# Patient Record
Sex: Male | Born: 1950 | Race: White | Hispanic: No | Smoking: Never smoker
Health system: Southern US, Community
[De-identification: ages and names within clinical notes are randomized; demographics above are authoritative.]

## PROBLEM LIST (undated history)

## (undated) DIAGNOSIS — N400 Enlarged prostate without lower urinary tract symptoms: Secondary | ICD-10-CM

## (undated) DIAGNOSIS — E785 Hyperlipidemia, unspecified: Secondary | ICD-10-CM

## (undated) DIAGNOSIS — C801 Malignant (primary) neoplasm, unspecified: Secondary | ICD-10-CM

## (undated) DIAGNOSIS — E119 Type 2 diabetes mellitus without complications: Secondary | ICD-10-CM

## (undated) DIAGNOSIS — M1990 Unspecified osteoarthritis, unspecified site: Secondary | ICD-10-CM

## (undated) DIAGNOSIS — K760 Fatty (change of) liver, not elsewhere classified: Secondary | ICD-10-CM

## (undated) DIAGNOSIS — Z8619 Personal history of other infectious and parasitic diseases: Secondary | ICD-10-CM

## (undated) DIAGNOSIS — D573 Sickle-cell trait: Secondary | ICD-10-CM

## (undated) DIAGNOSIS — T8859XA Other complications of anesthesia, initial encounter: Secondary | ICD-10-CM

## (undated) DIAGNOSIS — I10 Essential (primary) hypertension: Secondary | ICD-10-CM

## (undated) DIAGNOSIS — C44301 Unspecified malignant neoplasm of skin of nose: Secondary | ICD-10-CM

## (undated) DIAGNOSIS — Z8739 Personal history of other diseases of the musculoskeletal system and connective tissue: Secondary | ICD-10-CM

## (undated) DIAGNOSIS — I890 Lymphedema, not elsewhere classified: Secondary | ICD-10-CM

## (undated) HISTORY — PX: VASECTOMY: SHX75

## (undated) HISTORY — DX: Essential (primary) hypertension: I10

## (undated) HISTORY — DX: Type 2 diabetes mellitus without complications: E11.9

## (undated) HISTORY — DX: Unspecified osteoarthritis, unspecified site: M19.90

## (undated) HISTORY — PX: COLONOSCOPY: SHX174

## (undated) HISTORY — DX: Personal history of other diseases of the musculoskeletal system and connective tissue: Z87.39

## (undated) HISTORY — DX: Personal history of other infectious and parasitic diseases: Z86.19

## (undated) HISTORY — DX: Hyperlipidemia, unspecified: E78.5

## (undated) HISTORY — DX: Malignant (primary) neoplasm, unspecified: C80.1

## (undated) HISTORY — PX: INGUINAL HERNIA REPAIR: SUR1180

## (undated) HISTORY — PX: COLONOSCOPY WITH ESOPHAGOGASTRODUODENOSCOPY (EGD): SHX5779

## (undated) HISTORY — PX: TONSILLECTOMY AND ADENOIDECTOMY: SUR1326

## (undated) HISTORY — PX: EYE SURGERY: SHX253

## (undated) HISTORY — DX: Lymphedema, not elsewhere classified: I89.0

---

## 1978-08-08 HISTORY — PX: INGUINAL HERNIA REPAIR: SUR1180

## 1980-08-08 HISTORY — PX: ANTERIOR CRUCIATE LIGAMENT REPAIR: SHX115

## 2004-08-08 HISTORY — PX: GASTRIC BYPASS: SHX52

## 2010-04-05 ENCOUNTER — Emergency Department: Payer: Self-pay | Admitting: Emergency Medicine

## 2010-09-09 ENCOUNTER — Emergency Department: Payer: Self-pay | Admitting: Emergency Medicine

## 2010-11-02 ENCOUNTER — Ambulatory Visit: Payer: Self-pay | Admitting: Internal Medicine

## 2010-11-18 ENCOUNTER — Ambulatory Visit: Payer: Self-pay | Admitting: Orthopedic Surgery

## 2012-08-08 HISTORY — PX: MENISCUS REPAIR: SHX5179

## 2015-06-17 DIAGNOSIS — R7989 Other specified abnormal findings of blood chemistry: Secondary | ICD-10-CM | POA: Insufficient documentation

## 2015-06-25 ENCOUNTER — Ambulatory Visit (INDEPENDENT_AMBULATORY_CARE_PROVIDER_SITE_OTHER): Payer: Medicare Other | Admitting: Urology

## 2015-06-25 ENCOUNTER — Encounter: Payer: Self-pay | Admitting: Urology

## 2015-06-25 VITALS — BP 146/86 | HR 62 | Ht 68.0 in | Wt 235.2 lb

## 2015-06-25 DIAGNOSIS — E785 Hyperlipidemia, unspecified: Secondary | ICD-10-CM | POA: Insufficient documentation

## 2015-06-25 DIAGNOSIS — R972 Elevated prostate specific antigen [PSA]: Secondary | ICD-10-CM | POA: Diagnosis not present

## 2015-06-25 DIAGNOSIS — D573 Sickle-cell trait: Secondary | ICD-10-CM | POA: Insufficient documentation

## 2015-06-25 DIAGNOSIS — Z8639 Personal history of other endocrine, nutritional and metabolic disease: Secondary | ICD-10-CM | POA: Insufficient documentation

## 2015-06-25 DIAGNOSIS — M1990 Unspecified osteoarthritis, unspecified site: Secondary | ICD-10-CM

## 2015-06-25 DIAGNOSIS — I10 Essential (primary) hypertension: Secondary | ICD-10-CM | POA: Insufficient documentation

## 2015-06-25 DIAGNOSIS — Z8619 Personal history of other infectious and parasitic diseases: Secondary | ICD-10-CM

## 2015-06-25 DIAGNOSIS — Z8739 Personal history of other diseases of the musculoskeletal system and connective tissue: Secondary | ICD-10-CM | POA: Insufficient documentation

## 2015-06-25 DIAGNOSIS — E119 Type 2 diabetes mellitus without complications: Secondary | ICD-10-CM

## 2015-06-25 HISTORY — DX: Personal history of other diseases of the musculoskeletal system and connective tissue: Z87.39

## 2015-06-25 HISTORY — DX: Unspecified osteoarthritis, unspecified site: M19.90

## 2015-06-25 HISTORY — DX: Essential (primary) hypertension: I10

## 2015-06-25 HISTORY — DX: Personal history of other infectious and parasitic diseases: Z86.19

## 2015-06-25 HISTORY — DX: Type 2 diabetes mellitus without complications: E11.9

## 2015-06-25 MED ORDER — FINASTERIDE 5 MG PO TABS: 5.0000 mg | ORAL_TABLET | Freq: Every day | ORAL | Status: DC

## 2015-06-25 NOTE — Addendum Note (Signed)
Addended by: Catalina LungerBUDZYN, Marisha Renier J on: 06/25/2015 03:40 PM   Modules accepted: Orders

## 2015-06-25 NOTE — Progress Notes (Signed)
06/25/2015 2:47 PM   Gene Brown February 13, 1951 [id number]  Referring provider: Barbette Reichmann, MD [address] [facility] [place], [place] [address]  Chief Complaint  Patient presents with  . Elevated PSA    Pt reports having (-) neg prostate biopsy x 22yrs ago by urologist [facility].     HPI: The patient is a 64 year old male who presents with an elevated PSA. His PSA is 5.7. He had a prostate biopsy 10 years ago in [place] which was negative. He does not appear he has had his PSA checked regularly since that time.  He also has significant voiding symptoms. His I PSS score is 26/4 with fours and fives for incomplete emptying, frequency, intermittency, urgency, and weak stream. He also notes nocturia 3. He was just started on Flomax yesterday by his PCP.  The patient also has erectile dysfunction. He unable to obtain erections suitable for intercourse. He has tried Viagra and Cialis in the past. These medications have worked, however he did not tolerate the side effects in particular congestion in his head.  PMH: Past Medical History  Diagnosis Date  . BP (high blood pressure) 06/25/2015  . Type 2 diabetes mellitus (HCC) 06/25/2015  . Arthritis 06/25/2015  . H/O varicella 06/25/2015  . H/O: gout 06/25/2015    Surgical History: Past Surgical History  Procedure Laterality Date  . Tonsillectomy and adenoidectomy    . Vasectomy    . Inguinal hernia repair    . Anterior cruciate ligament repair  1982    2004-2005  . Gastric bypass  2006  . Meniscus repair  2014    Home Medications:    Medication List       This list is accurate as of: 06/25/15  2:47 PM.  Always use your most recent med list.               amoxicillin-clavulanate 875-125 MG tablet  Commonly known as:  AUGMENTIN     glipiZIDE 5 MG tablet  Commonly known as:  GLUCOTROL  Take by mouth.     lisinopril 5 MG tablet  Commonly known as:  PRINIVIL,ZESTRIL  Take by  mouth.     metFORMIN 1000 MG tablet  Commonly known as:  GLUCOPHAGE  TAKE 1 TABLET (1,000 MG TOTAL) BY MOUTH 2 (TWO) TIMES DAILY WITH MEALS.     RA VITAMIN B-12 TR 1000 MCG Tbcr  Generic drug:  Cyanocobalamin  Take by mouth.     tamsulosin 0.4 MG Caps capsule  Commonly known as:  FLOMAX  Take by mouth.        Allergies: No Known Allergies  Family History: Family History  Problem Relation Age of Onset  . Prostate cancer Father   . Hematuria Neg Hx   . Renal cancer Neg Hx     Social History:  reports that he has never smoked. He does not have any smokeless tobacco history on file. He reports that he does not drink alcohol or use illicit drugs.  ROS: UROLOGY Frequent Urination?: Yes Hard to postpone urination?: Yes Burning/pain with urination?: Yes Get up at night to urinate?: Yes Leakage of urine?: Yes Urine stream starts and stops?: Yes Trouble starting stream?: No Do you have to strain to urinate?: No Blood in urine?: No Urinary tract infection?: No Sexually transmitted disease?: No Injury to kidneys or bladder?: No Painful intercourse?: No Weak stream?: Yes Erection problems?: Yes Penile pain?: No  Gastrointestinal Nausea?: No Vomiting?: No Indigestion/heartburn?: No Diarrhea?:  Yes Constipation?: Yes  Constitutional Fever: No Night sweats?: No Weight loss?: Yes Fatigue?: Yes  Skin Skin rash/lesions?: Yes Itching?: No  Eyes Blurred vision?: No Double vision?: No  Ears/Nose/Throat Sore throat?: No Sinus problems?: No  Hematologic/Lymphatic Swollen glands?: No Easy bruising?: No  Cardiovascular Leg swelling?: No Chest pain?: No  Respiratory Cough?: No Shortness of breath?: No  Endocrine Excessive thirst?: Yes  Musculoskeletal Back pain?: No Joint pain?: Yes  Neurological Headaches?: No Dizziness?: No  Psychologic Depression?: No Anxiety?: No  Physical Exam: BP 146/86 mmHg  Pulse 62  Ht 5\' 8"  (1.727 m)  Wt 235 lb 3.2  oz (106.686 kg)  BMI 35.77 kg/m2  Constitutional:  Alert and oriented, No acute distress. HEENT: [place] AT, moist mucus membranes.  Trachea midline, no masses. Cardiovascular: No clubbing, cyanosis, or edema. Respiratory: Normal respiratory effort, no increased work of breathing. GI: Abdomen is soft, nontender, nondistended, no abdominal masses GU: No CVA tenderness. Normal phallus. Testicles descended bilaterally. They're somewhat atrophic. No masses. DRE: 3+, smooth, no nodules, no induration. Skin: No rashes, bruises or suspicious lesions. Lymph: No cervical or inguinal adenopathy. Neurologic: Grossly intact, no focal deficits, moving all 4 extremities. Psychiatric: Normal mood and affect.  Laboratory Data: No results found for: WBC, HGB, HCT, MCV, PLT  No results found for: CREATININE  No results found for: PSA  No results found for: TESTOSTERONE  No results found for: HGBA1C  Urinalysis No results found for: COLORURINE, APPEARANCEUR, LABSPEC, PHURINE, GLUCOSEU, HGBUR, BILIRUBINUR, KETONESUR, PROTEINUR, UROBILINOGEN, NITRITE, LEUKOCYTESUR  PVR: 250 cc  Assessment & Plan:     We reviewed the implications of an elevated PSA and the uncertainty surrounding it. In general, a man's PSA increases with age and is produced by both normal and cancerous prostate tissue. Differential for elevated PSA is BPH, prostate cancer, infection, recent intercourse/ejaculation, prostate infarction, recent urethroscopic manipulation (foley placement/cystoscopy) and prostatitis. Management of an elevated PSA can include observation or prostate biopsy and wediscussed this in detail. We discussed that indications for prostate biopsy are defined by age and race specific PSA cutoffs as well as a PSA velocity of 0.75/year.  We discussed prostate biopsy in detail including the procedure itself, the risks of blood in the urine, stool, and ejaculate, serious infection, and discomfort. He is willing to proceed with  this as discussed.  1. Elevated PSA with history of negative biopsy 10 years ago -prostate biopsy - PSA  2. BPH with lower urinary tract symptoms The patient was just started on Flomax. He also has a high postvoid residual of 250 cc. We'll also start him on finasteride to shrink his prostate. He is aware that this will take approximately 6 weeks to have its maximal effect. He'll follow-up in 3 months for a PVR, I PSS, and uroflow. We will also taken into accont the fact he is on finasteride for all this future PSA draws.   3. Erectile dysfunction The patient does not tolerate the side effects of oral medications. We briefly discussed intracavernosal injections. We will further discuss this after we evaluate his elevated PSA.   Return in about 2 weeks (around 07/09/2015) for prostate biopsy.  Hildred LaserBrian James Alannis Hsia, MD  [facility] [address], [address] [place], [place] [address] [phone]

## 2015-06-25 NOTE — Addendum Note (Signed)
Addended by: Lonna CobbUSSELL, KELITA L on: 06/25/2015 04:39 PM   Modules accepted: Orders

## 2015-06-26 LAB — PSA: Prostate Specific Ag, Serum: 5.5 ng/mL — ABNORMAL HIGH (ref 0.0–4.0)

## 2015-07-14 ENCOUNTER — Ambulatory Visit (INDEPENDENT_AMBULATORY_CARE_PROVIDER_SITE_OTHER): Payer: Medicare Other | Admitting: Urology

## 2015-07-14 ENCOUNTER — Other Ambulatory Visit: Payer: Self-pay | Admitting: Urology

## 2015-07-14 ENCOUNTER — Encounter: Payer: Self-pay | Admitting: Urology

## 2015-07-14 VITALS — BP 166/105 | HR 76 | Ht 68.0 in | Wt 235.1 lb

## 2015-07-14 DIAGNOSIS — R972 Elevated prostate specific antigen [PSA]: Secondary | ICD-10-CM

## 2015-07-14 MED ORDER — GENTAMICIN SULFATE 40 MG/ML IJ SOLN
80.0000 mg | Freq: Once | INTRAMUSCULAR | Status: AC
  Administered 2015-07-14: 80 mg via INTRAMUSCULAR

## 2015-07-14 MED ORDER — LEVOFLOXACIN 500 MG PO TABS
500.0000 mg | ORAL_TABLET | Freq: Once | ORAL | Status: AC
  Administered 2015-07-14: 500 mg via ORAL

## 2015-07-14 NOTE — Procedures (Signed)
Prostate Biopsy Procedure   Informed consent was obtained after discussing risks/benefits of the procedure.  A time out was performed to ensure correct patient identity.  Pre-Procedure: - Last PSA Level:  Lab Results  Component Value Date   PSA 5.5* 06/25/2015   - Gentamicin given prophylactically - Levaquin 500 mg administered PO -Transrectal Ultrasound performed revealing a 100gm prostate -No significant hypoechoic or median lobe noted  Procedure: - Prostate block performed using 10 cc 1% lidocaine and biopsies taken from sextant areas, a total of 12 under ultrasound guidance.  Post-Procedure: - Patient tolerated the procedure well - He was counseled to seek immediate medical attention if experiences any severe pain, significant bleeding, or fevers - Return in one week to discuss biopsy results

## 2015-07-20 LAB — PATHOLOGY REPORT

## 2015-07-21 ENCOUNTER — Ambulatory Visit: Payer: Medicare Other | Admitting: Urology

## 2015-07-21 ENCOUNTER — Encounter: Payer: Self-pay | Admitting: Urology

## 2015-07-24 ENCOUNTER — Encounter: Payer: Self-pay | Admitting: Urology

## 2015-09-24 ENCOUNTER — Ambulatory Visit: Payer: Medicare Other | Admitting: Urology

## 2015-10-01 ENCOUNTER — Ambulatory Visit (INDEPENDENT_AMBULATORY_CARE_PROVIDER_SITE_OTHER): Payer: Medicare Other | Admitting: Urology

## 2015-10-01 ENCOUNTER — Encounter: Payer: Self-pay | Admitting: Urology

## 2015-10-01 VITALS — BP 129/85 | HR 71 | Ht 68.0 in | Wt 238.2 lb

## 2015-10-01 DIAGNOSIS — R972 Elevated prostate specific antigen [PSA]: Secondary | ICD-10-CM

## 2015-10-01 DIAGNOSIS — N529 Male erectile dysfunction, unspecified: Secondary | ICD-10-CM | POA: Diagnosis not present

## 2015-10-01 DIAGNOSIS — N400 Enlarged prostate without lower urinary tract symptoms: Secondary | ICD-10-CM | POA: Diagnosis not present

## 2015-10-01 MED ORDER — SILDENAFIL CITRATE 20 MG PO TABS: 20.0000 mg | ORAL_TABLET | ORAL | Status: DC

## 2015-10-01 NOTE — Progress Notes (Signed)
10/01/2015 10:39 AM   Gene Brown 04-09-1951 [id number]  Referring provider: Barbette Reichmann, MD [address] [facility] [place], [place] [address]  Chief Complaint  Patient presents with  . Elevated PSA  . Uroflow    HPI: The patient is a 65 year old male who presents with an elevated PSA. His PSA is 5.7. He had a prostate biopsy 10 years ago in [place] which was negative. He does not appear he has had his PSA checked regularly since that time. He had a repeat prostate biopsy in December 2016 which was negative for malignancy.  He also has significant voiding symptoms. His I PSS score is 26/4 with fours and fives for incomplete emptying, frequency, intermittency, urgency, and weak stream. He also notes nocturia 3. PVR was 250 cc He has now been on flomax and finasteride for three months. His PVR is now 174 cc.  Uroflow peak flow today is 24ml/s. IPSS today 16/3. He is very happy with improvement in his urinary symptoms. In fact, he ran out of medication a few days ago he noticed a drastic worsening of symptoms immediately improved and he resumed his medication.  The patient also has erectile dysfunction. He unable to obtain erections suitable for intercourse. He has tried Viagra and Cialis in the past. These medications have worked, however he had congestion in his head. He stopped the medication, however he would like to try these medications again as he did not find congestion not bothersome. He does not take nitrates for chest pain. He is not interested in intracavernosal injections. PMH: Past Medical History  Diagnosis Date  . BP (high blood pressure) 06/25/2015  . Type 2 diabetes mellitus (HCC) 06/25/2015  . Arthritis 06/25/2015  . H/O varicella 06/25/2015  . H/O: gout 06/25/2015    Surgical History: Past Surgical History  Procedure Laterality Date  . Tonsillectomy and adenoidectomy    . Vasectomy    . Inguinal hernia repair    . Anterior  cruciate ligament repair  1982    2004-2005  . Gastric bypass  2006  . Meniscus repair  2014    Home Medications:    Medication List       This list is accurate as of: 10/01/15 10:39 AM.  Always use your most recent med list.               co-enzyme Q-10 30 MG capsule  Take 30 mg by mouth 3 (three) times daily.     finasteride 5 MG tablet  Commonly known as:  PROSCAR  Take 1 tablet (5 mg total) by mouth daily.     glipiZIDE 5 MG tablet  Commonly known as:  GLUCOTROL  Take by mouth.     LANTUS 100 UNIT/ML injection  Generic drug:  insulin glargine  ADM 20 UNITS [place] QD     lisinopril 5 MG tablet  Commonly known as:  PRINIVIL,ZESTRIL  Take by mouth.     magnesium 30 MG tablet  Take 30 mg by mouth 2 (two) times daily.     metFORMIN 1000 MG tablet  Commonly known as:  GLUCOPHAGE  TAKE 1 TABLET (1,000 MG TOTAL) BY MOUTH 2 (TWO) TIMES DAILY WITH MEALS.     RA VITAMIN B-12 TR 1000 MCG Tbcr  Generic drug:  Cyanocobalamin  Take by mouth.     sildenafil 20 MG tablet  Commonly known as:  REVATIO  Take 1 tablet (20 mg total) by mouth as directed. Take 1 -  5 tabs PO daily prn     tamsulosin 0.4 MG Caps capsule  Commonly known as:  FLOMAX  Take by mouth.     VITAMIN D-1000 MAX ST 1000 units tablet  Generic drug:  Cholecalciferol  Take by mouth.     zinc gluconate 50 MG tablet  Take 50 mg by mouth daily.        Allergies: No Known Allergies  Family History: Family History  Problem Relation Age of Onset  . Prostate cancer Father   . Hematuria Neg Hx   . Renal cancer Neg Hx     Social History:  reports that he has never smoked. He does not have any smokeless tobacco history on file. He reports that he does not drink alcohol or use illicit drugs.  ROS: UROLOGY Frequent Urination?: No Hard to postpone urination?: No Burning/pain with urination?: No Get up at night to urinate?: Yes Leakage of urine?: No Urine stream starts and stops?: Yes Trouble starting  stream?: No Do you have to strain to urinate?: No Blood in urine?: No Urinary tract infection?: No Sexually transmitted disease?: No Injury to kidneys or bladder?: No Painful intercourse?: No Weak stream?: No Erection problems?: No Penile pain?: No  Gastrointestinal Nausea?: No Vomiting?: No Indigestion/heartburn?: No Diarrhea?: No Constipation?: No  Constitutional Fever: No Night sweats?: No Weight loss?: No Fatigue?: No  Skin Skin rash/lesions?: No Itching?: No  Eyes Blurred vision?: No Double vision?: No  Ears/Nose/Throat Sore throat?: No Sinus problems?: No  Hematologic/Lymphatic Swollen glands?: No Easy bruising?: No  Cardiovascular Leg swelling?: No Chest pain?: No  Respiratory Cough?: No Shortness of breath?: No  Endocrine Excessive thirst?: Yes  Musculoskeletal Back pain?: No Joint pain?: No  Neurological Headaches?: No Dizziness?: No  Psychologic Depression?: No Anxiety?: No  Physical Exam: BP 129/85 mmHg  Pulse 71  Ht  (1.727 m)  Wt 238 lb 3.2 oz (108.047 kg)  BMI 36.23 kg/m2  Constitutional:  Alert and oriented, No acute distress. HEENT: [place] AT, moist mucus membranes.  Trachea midline, no masses. Cardiovascular: No clubbing, cyanosis, or edema. Respiratory: Normal respiratory effort, no increased work of breathing. GI: Abdomen is soft, nontender, nondistended, no abdominal masses GU: No CVA tenderness.  Skin: No rashes, bruises or suspicious lesions. Lymph: No cervical or inguinal adenopathy. Neurologic: Grossly intact, no focal deficits, moving all 4 extremities. Psychiatric: Normal mood and affect.  Laboratory Data: No results found for: WBC, HGB, HCT, MCV, PLT  No results found for: CREATININE  No results found for: PSA  No results found for: TESTOSTERONE  No results found for: HGBA1C  Urinalysis No results found for: COLORURINE, APPEARANCEUR, LABSPEC, PHURINE, GLUCOSEU, HGBUR, BILIRUBINUR, KETONESUR,  PROTEINUR, UROBILINOGEN, NITRITE, LEUKOCYTESUR  PVR: 174 cc Peak flow: 9 ml/s Voided volume: 156 cc  Assessment & Plan:    1. Elevated PSA with history of negative biopsy 10 years ago and in December 2016 -repeat PSA in 3 months (patient is now on finasteride)  2. BPH with lower urinary tract symptoms -continue flomax/finasteride  3. Erectile dysfunction -The patient was ordered generic sildenafil 20 mg. He was instructed to take 1-5 tabs by mouth when necessary. He is warned of the risk of priapism and the need for emergent intervention in the emergency room.  Return in about 3 months (around 12/29/2015) for with PSA one week prior.  Hildred Laser, MD  [facility] [address] [place], [place] [address] [phone]

## 2015-10-02 NOTE — Progress Notes (Signed)
Uroflow  Peak Flow: 9.73ml Average Flow: 6.4ml Voided Volume: 156.38ml Voiding Time: 28.9sec Flow Time: 25.6sec Time to Peak Flow: 11.1sec  PVR Volume: 

## 2015-10-02 NOTE — Addendum Note (Signed)
Addended by: Skeet Latch on: 10/02/2015 08:18 AM   Modules accepted: Orders

## 2015-11-11 ENCOUNTER — Other Ambulatory Visit: Payer: Self-pay | Admitting: Urology

## 2015-11-18 ENCOUNTER — Other Ambulatory Visit: Payer: Self-pay

## 2015-11-18 DIAGNOSIS — R972 Elevated prostate specific antigen [PSA]: Secondary | ICD-10-CM

## 2015-11-18 MED ORDER — FINASTERIDE 5 MG PO TABS: 5.0000 mg | ORAL_TABLET | Freq: Every day | ORAL | Status: DC

## 2015-11-23 ENCOUNTER — Other Ambulatory Visit: Payer: Self-pay | Admitting: Vascular Surgery

## 2015-11-26 MED ORDER — DEXTROSE 5 % IV SOLN: 1.5000 g | INTRAVENOUS | Status: DC

## 2015-11-30 ENCOUNTER — Other Ambulatory Visit
Admission: RE | Admit: 2015-11-30 | Discharge: 2015-11-30 | Disposition: A | Discharge status: Home / Self Care | Payer: Medicare Other | Source: Ambulatory Visit | Attending: Vascular Surgery | Admitting: Vascular Surgery

## 2015-11-30 DIAGNOSIS — R972 Elevated prostate specific antigen [PSA]: Secondary | ICD-10-CM | POA: Insufficient documentation

## 2015-11-30 LAB — CREATININE, SERUM
Creatinine, Ser: 0.84 mg/dL (ref 0.61–1.24)
GFR calc non Af Amer: >60 mL/min (ref >60)

## 2015-11-30 LAB — BUN: BUN: 16 mg/dL (ref 6–20)

## 2015-12-03 ENCOUNTER — Ambulatory Visit
Admission: RE | Admit: 2015-12-03 | Discharge: 2015-12-03 | Disposition: A | Discharge status: Home / Self Care | Payer: Medicare Other | Source: Ambulatory Visit | Attending: Vascular Surgery | Admitting: Vascular Surgery

## 2015-12-03 MED ORDER — FAMOTIDINE 20 MG PO TABS: 40.0000 mg | ORAL_TABLET | ORAL | Status: DC | PRN

## 2015-12-03 MED ORDER — METHYLPREDNISOLONE SODIUM SUCC 125 MG IJ SOLR: 125.0000 mg | INTRAMUSCULAR | Status: DC | PRN

## 2015-12-03 MED ORDER — ONDANSETRON HCL 4 MG/2ML IJ SOLN: 4.0000 mg | Freq: Four times a day (QID) | INTRAMUSCULAR | Status: DC | PRN

## 2015-12-03 MED ORDER — SODIUM CHLORIDE 0.9 % IV SOLN: INTRAVENOUS | Status: DC

## 2015-12-03 MED ORDER — HYDROMORPHONE HCL 1 MG/ML IJ SOLN: 1.0000 mg | Freq: Once | INTRAMUSCULAR | Status: DC

## 2015-12-03 NOTE — H&P (Signed)
  [facility] History & Physical Update  The patient was interviewed and re-examined.  The patient's previous History and Physical has been reviewed and is unchanged.  There is no change in the plan of care. We plan to proceed with the scheduled procedure.  DEW,JASON, MD  12/03/2015, 9:54 AM

## 2015-12-07 ENCOUNTER — Other Ambulatory Visit: Payer: Self-pay | Admitting: Vascular Surgery

## 2015-12-10 ENCOUNTER — Ambulatory Visit: Admit: 2015-12-10 | Payer: Self-pay | Admitting: Vascular Surgery

## 2015-12-10 ENCOUNTER — Encounter: Admission: RE | Payer: Self-pay | Source: Ambulatory Visit

## 2015-12-10 ENCOUNTER — Ambulatory Visit: Admission: RE | Admit: 2015-12-10 | Payer: Medicare Other | Source: Ambulatory Visit | Admitting: Vascular Surgery

## 2015-12-10 SURGERY — LOWER EXTREMITY ANGIOGRAPHY
Anesthesia: Moderate Sedation | Site: Leg Lower | Laterality: Left

## 2015-12-10 SURGERY — LOWER EXTREMITY ANGIOGRAPHY
Anesthesia: Moderate Sedation | Laterality: Left

## 2015-12-10 MED ORDER — DEXTROSE 5 % IV SOLN: 1.5000 g | INTRAVENOUS | Status: DC

## 2015-12-21 ENCOUNTER — Other Ambulatory Visit: Payer: Self-pay

## 2015-12-21 DIAGNOSIS — R972 Elevated prostate specific antigen [PSA]: Secondary | ICD-10-CM

## 2015-12-22 ENCOUNTER — Other Ambulatory Visit: Payer: Medicare Other

## 2015-12-22 ENCOUNTER — Encounter: Payer: Self-pay | Admitting: Urology

## 2015-12-29 ENCOUNTER — Ambulatory Visit: Payer: Medicare Other

## 2015-12-29 ENCOUNTER — Ambulatory Visit (INDEPENDENT_AMBULATORY_CARE_PROVIDER_SITE_OTHER): Payer: Medicare Other | Admitting: Urology

## 2015-12-29 VITALS — BP 161/90 | HR 76 | Ht 68.0 in | Wt 242.0 lb

## 2015-12-29 DIAGNOSIS — N529 Male erectile dysfunction, unspecified: Secondary | ICD-10-CM | POA: Diagnosis not present

## 2015-12-29 DIAGNOSIS — R972 Elevated prostate specific antigen [PSA]: Secondary | ICD-10-CM

## 2015-12-29 MED ORDER — SILDENAFIL CITRATE 20 MG PO TABS: 20.0000 mg | ORAL_TABLET | ORAL | Status: DC | PRN

## 2015-12-29 NOTE — Progress Notes (Signed)
12:15 PM   Gene Brown 07/01/1951 [id number]  Referring provider: Barbette ReichmannVishwanath Hande, MD [address] [facility] [place], [place] [id number]  Chief Complaint  Patient presents with  . Benign Prostatic Hypertrophy    64month  . Elevated PSA    HPI: The patient is a 65 year old male who presents with an elevated PSA. His PSA is 5.7. He had a prostate biopsy 10 years ago in [place] which was negative. He does not appear he has had his PSA checked regularly since that time. He had a repeat prostate biopsy in December 2016 which was negative for malignancy.  He also has significant voiding symptoms. His I PSS score is 26/4 with fours and fives for incomplete emptying, frequency, intermittency, urgency, and weak stream. He also notes nocturia 3. PVR was 250 cc He has now been on flomax and finasteride for three months. His PVR is now 174 cc.  Uroflow peak flow today is 249ml/s. IPSS today 16/3. He is very happy with improvement in his urinary symptoms. In fact, he ran out of medication a few days ago he noticed a drastic worsening of symptoms immediately improved and he resumed his medication.  The patient also has erectile dysfunction. He unable to obtain erections suitable for intercourse. He has tried Viagra and Cialis in the past. These medications have worked, however he had congestion in his head. He stopped the medication, however he would like to try these medications again as he did not find congestion not bothersome. He does not take nitrates for chest pain. He is not interested in intracavernosal injections.  Interval: The patient presents today for follow-up. He was intended to get a PSA prior which he was unable to get. The patient states his voiding symptoms are better on tamsulosin and finasteride. He's been on finasteride at this point for just about 3 months. He also inquires about sildenafil which was sent to the wrong pharmacy as time. We also discussed  the penile prosthesis surgery and recovery.  PMH: Past Medical History  Diagnosis Date  . BP (high blood pressure) 06/25/2015  . Type 2 diabetes mellitus (HCC) 06/25/2015  . Arthritis 06/25/2015  . H/O varicella 06/25/2015  . H/O: gout 06/25/2015    Surgical History: Past Surgical History  Procedure Laterality Date  . Tonsillectomy and adenoidectomy    . Vasectomy    . Inguinal hernia repair    . Anterior cruciate ligament repair  1982    2004-2005  . Gastric bypass  2006  . Meniscus repair  2014    Home Medications:    Medication List       This list is accurate as of: 12/29/15 12:15 PM.  Always use your most recent med list.               co-enzyme Q-10 30 MG capsule  Take 30 mg by mouth 3 (three) times daily.     finasteride 5 MG tablet  Commonly known as:  PROSCAR  Take 1 tablet (5 mg total) by mouth daily.     glipiZIDE 5 MG tablet  Commonly known as:  GLUCOTROL  Take by mouth.     LANTUS 100 UNIT/ML injection  Generic drug:  insulin glargine  ADM 20 UNITS [place] QD     lisinopril 40 MG tablet  Commonly known as:  PRINIVIL,ZESTRIL     magnesium 30 MG tablet  Take 30 mg by mouth 2 (two) times daily.     metFORMIN  1000 MG tablet  Commonly known as:  GLUCOPHAGE  TAKE 1 TABLET (1,000 MG TOTAL) BY MOUTH 2 (TWO) TIMES DAILY WITH MEALS.     RA VITAMIN B-12 TR 1000 MCG Tbcr  Generic drug:  Cyanocobalamin  Take by mouth.     sildenafil 20 MG tablet  Commonly known as:  REVATIO  Take 1 tablet (20 mg total) by mouth as needed (2-5 tablets daily as needed). Take 2- 5 tabs PO daily prn     tamsulosin 0.4 MG Caps capsule  Commonly known as:  FLOMAX  Take by mouth.     VITAMIN D-1000 MAX ST 1000 units tablet  Generic drug:  Cholecalciferol  Take by mouth.     zinc gluconate 50 MG tablet  Take 50 mg by mouth daily.        Allergies: No Known Allergies  Family History: Family History  Problem Relation Age of Onset  . Prostate cancer Father   .  Hematuria Neg Hx   . Renal cancer Neg Hx     Social History:  reports that he has never smoked. He does not have any smokeless tobacco history on file. He reports that he does not drink alcohol or use illicit drugs.  ROS: UROLOGY Frequent Urination?: No Hard to postpone urination?: No Burning/pain with urination?: No Get up at night to urinate?: No Leakage of urine?: No Urine stream starts and stops?: No Trouble starting stream?: No Do you have to strain to urinate?: No Blood in urine?: No Urinary tract infection?: No Sexually transmitted disease?: No Injury to kidneys or bladder?: No Painful intercourse?: No Weak stream?: No Erection problems?: No Penile pain?: No  Gastrointestinal Nausea?: No Vomiting?: No Indigestion/heartburn?: No Diarrhea?: No Constipation?: No  Constitutional Fever: No Night sweats?: No Weight loss?: No Fatigue?: No  Skin Skin rash/lesions?: No Itching?: No  Eyes Blurred vision?: No Double vision?: No  Ears/Nose/Throat Sore throat?: No Sinus problems?: No  Hematologic/Lymphatic Swollen glands?: No Easy bruising?: No  Cardiovascular Leg swelling?: No Chest pain?: No  Respiratory Cough?: No Shortness of breath?: No  Endocrine Excessive thirst?: No  Musculoskeletal Back pain?: No Joint pain?: No  Neurological Headaches?: No Dizziness?: No  Psychologic Depression?: No Anxiety?: No  Physical Exam: BP 161/90 mmHg  Pulse 76  Ht  (1.727 m)  Wt 242 lb (109.77 kg)  BMI 36.80 kg/m2  Constitutional:  Alert and oriented, No acute distress.   Laboratory Data: No results found for: WBC, HGB, HCT, MCV, PLT  Lab Results  Component Value Date   CREATININE 0.84 11/30/2015    No results found for: PSA  No results found for: TESTOSTERONE  No results found for: HGBA1C  Urinalysis No results found for: COLORURINE, APPEARANCEUR, LABSPEC, PHURINE, GLUCOSEU, HGBUR, BILIRUBINUR, KETONESUR, PROTEINUR, UROBILINOGEN,  NITRITE, LEUKOCYTESUR  PVR: 174 cc Peak flow: 9 ml/s Voided volume: 156 cc  Assessment & Plan:    I refilled the patient's sildenafil prescription. We discussed the prosthesis surgery in quite a bit of detail. He can and try the medications first. He did tell me that his wife was nearly bedridden at this point. The patient will return in 3 months with a PSA prior. He did have a PSA today, I will send him the results. Return in about 3 months (around 03/30/2016).  Crist Fat, MD  [facility] [address], [address] [place], [place] [address] [phone]

## 2015-12-30 LAB — PSA: Prostate Specific Ag, Serum: 2.8 ng/mL (ref 0.0–4.0)

## 2015-12-31 ENCOUNTER — Telehealth: Payer: Self-pay

## 2015-12-31 NOTE — Telephone Encounter (Signed)
Pt called and I gave him message from Dr. Leonard SchwartzB.

## 2015-12-31 NOTE — Telephone Encounter (Signed)
Not available and not able to leave a message.

## 2015-12-31 NOTE — Telephone Encounter (Signed)
-----   Message from Hildred LaserBrian James Budzyn, MD sent at 12/31/2015 10:37 AM EDT ----- Please let patient know PSA is 2.8. It is down from 5.5.  This drop is expected with the addition of finasteride. He should be follow up as previously scheduled.

## 2016-01-06 ENCOUNTER — Telehealth: Payer: Self-pay

## 2016-01-06 NOTE — Telephone Encounter (Signed)
Pt was given information on 12/31/15.

## 2016-01-06 NOTE — Telephone Encounter (Signed)
-----   Message from Crist FatBenjamin W Herrick, MD sent at 01/05/2016  4:46 PM EDT ----- Please forward patient's PSA to him and let him know that it has returned to within the normal range.  ----- Message -----    From: SYSTEM    Sent: 01/03/2016  12:05 AM      To: Crist FatBenjamin W Herrick, MD

## 2016-03-21 ENCOUNTER — Other Ambulatory Visit: Payer: Self-pay

## 2016-03-21 DIAGNOSIS — N400 Enlarged prostate without lower urinary tract symptoms: Secondary | ICD-10-CM

## 2016-03-22 ENCOUNTER — Other Ambulatory Visit: Payer: Medicare Other

## 2016-03-22 ENCOUNTER — Encounter: Payer: Self-pay | Admitting: Urology

## 2016-03-29 ENCOUNTER — Ambulatory Visit: Payer: Medicare Other | Admitting: Urology

## 2016-04-12 ENCOUNTER — Other Ambulatory Visit: Payer: Medicare Other

## 2016-04-12 DIAGNOSIS — N400 Enlarged prostate without lower urinary tract symptoms: Secondary | ICD-10-CM

## 2016-04-13 LAB — PSA: PROSTATE SPECIFIC AG, SERUM: 3.4 ng/mL (ref 0.0–4.0)

## 2016-04-14 ENCOUNTER — Telehealth: Payer: Self-pay

## 2016-04-14 NOTE — Telephone Encounter (Signed)
-----   Message from Crist FatBenjamin W Herrick, MD sent at 04/14/2016  5:03 AM EDT ----- Please reassure patient that his PSA is within normal limits.

## 2016-04-14 NOTE — Telephone Encounter (Signed)
Spoke with pt wife in reference to PSA results. Wife voiced understanding.  

## 2016-04-19 ENCOUNTER — Ambulatory Visit: Payer: Medicare Other | Admitting: Urology

## 2016-04-19 ENCOUNTER — Encounter: Payer: Self-pay | Admitting: Urology

## 2016-05-09 ENCOUNTER — Ambulatory Visit: Payer: Medicare Other | Admitting: Urology

## 2016-05-24 ENCOUNTER — Ambulatory Visit
Admission: RE | Admit: 2016-05-24 | Discharge: 2016-05-24 | Disposition: A | Discharge status: Home / Self Care | Payer: Medicare Other | Source: Ambulatory Visit | Attending: Internal Medicine | Admitting: Internal Medicine

## 2016-05-24 ENCOUNTER — Other Ambulatory Visit: Payer: Self-pay | Admitting: Internal Medicine

## 2016-05-24 ENCOUNTER — Encounter: Payer: Medicare Other | Attending: Internal Medicine | Admitting: Internal Medicine

## 2016-05-24 DIAGNOSIS — M109 Gout, unspecified: Secondary | ICD-10-CM | POA: Insufficient documentation

## 2016-05-24 DIAGNOSIS — E11621 Type 2 diabetes mellitus with foot ulcer: Secondary | ICD-10-CM | POA: Insufficient documentation

## 2016-05-24 DIAGNOSIS — D573 Sickle-cell trait: Secondary | ICD-10-CM | POA: Diagnosis not present

## 2016-05-24 DIAGNOSIS — S91102A Unspecified open wound of left great toe without damage to nail, initial encounter: Secondary | ICD-10-CM | POA: Diagnosis not present

## 2016-05-24 DIAGNOSIS — G4730 Sleep apnea, unspecified: Secondary | ICD-10-CM | POA: Diagnosis not present

## 2016-05-24 DIAGNOSIS — L97523 Non-pressure chronic ulcer of other part of left foot with necrosis of muscle: Secondary | ICD-10-CM | POA: Insufficient documentation

## 2016-05-24 DIAGNOSIS — I10 Essential (primary) hypertension: Secondary | ICD-10-CM | POA: Insufficient documentation

## 2016-05-24 DIAGNOSIS — S91109A Unspecified open wound of unspecified toe(s) without damage to nail, initial encounter: Secondary | ICD-10-CM | POA: Diagnosis present

## 2016-05-24 DIAGNOSIS — R937 Abnormal findings on diagnostic imaging of other parts of musculoskeletal system: Secondary | ICD-10-CM | POA: Insufficient documentation

## 2016-05-24 DIAGNOSIS — Z794 Long term (current) use of insulin: Secondary | ICD-10-CM | POA: Insufficient documentation

## 2016-05-24 DIAGNOSIS — E1142 Type 2 diabetes mellitus with diabetic polyneuropathy: Secondary | ICD-10-CM | POA: Diagnosis not present

## 2016-05-24 DIAGNOSIS — X58XXXA Exposure to other specified factors, initial encounter: Secondary | ICD-10-CM | POA: Diagnosis not present

## 2016-05-24 NOTE — Progress Notes (Signed)
Gene Brown, Gene V. ([id number]) Visit Report for 05/24/2016 Abuse/Suicide Risk Screen Details Patient Name: Gene Brown, [name] Date of Service: 05/24/2016 8:00 AM Medical Record Number: [id number] Patient Account Number: [id number] Date of Birth/Sex: 06/20/1951 (65 y.o. Male) Treating RN: Huel CoventryWoody, Kim Primary Care Physician: Barbette ReichmannHande, Vishwanath Other Clinician: Referring Physician: Barbette ReichmannHande, Vishwanath Treating Physician/Extender: Maxwell CaulOBSON, MICHAEL G Weeks in Treatment: 0 Abuse/Suicide Risk Screen Items Answer ABUSE/SUICIDE RISK SCREEN: Has anyone close to you tried to hurt or harm you recentlyo No Do you feel uncomfortable with anyone in your familyo No Has anyone forced you do things that you didnot want to doo No Do you have any thoughts of harming yourselfo No Patient displays signs or symptoms of abuse and/or neglect. No Electronic Signature(s) Signed: 05/24/2016 3:51:22 PM By: Elliot GurneyWoody, RN, BSN, Kim RN, BSN Entered By: Elliot GurneyWoody, RN, BSN, Kim on 05/24/2016 08:41:45 Gene Brown, Gene V. ([id number]) -------------------------------------------------------------------------------- Activities of Daily Living Details Patient Name: Gene Brown, Gene V. Date of Service: 05/24/2016 8:00 AM Medical Record Number: [id number] Patient Account Number: [id number] Date of Birth/Sex: 06/05/1951 (65 y.o. Male) Treating RN: Huel CoventryWoody, Kim Primary Care Physician: Barbette ReichmannHande, Vishwanath Other Clinician: Referring Physician: Barbette ReichmannHande, Vishwanath Treating Physician/Extender: Maxwell CaulOBSON, MICHAEL G Weeks in Treatment: 0 Activities of Daily Living Items Answer Activities of Daily Living (Please select one for each item) Drive Automobile Completely Able Take Medications Completely Able Use Telephone Completely Able Care for Appearance Completely Able Use Toilet Completely Able Bath / Shower Completely Able Dress Self Completely Able Feed Self Completely Able Walk Completely Able Get In / Out Bed Completely Able Housework Completely  Able Prepare Meals Completely Able Handle Money Completely Able Shop for Self Completely Able Electronic Signature(s) Signed: 05/24/2016 3:51:22 PM By: Elliot GurneyWoody, RN, BSN, Kim RN, BSN Entered By: Elliot GurneyWoody, RN, BSN, Kim on 05/24/2016 08:43:08 Gene Brown, [name] ([id number]) -------------------------------------------------------------------------------- Education Assessment Details Patient Name: Gene Brown, Gene V. Date of Service: 05/24/2016 8:00 AM Medical Record Number: [id number] Patient Account Number: [id number] Date of Birth/Sex: 03/01/1951 (65 y.o. Male) Treating RN: Huel CoventryWoody, Kim Primary Care Physician: Barbette ReichmannHande, Vishwanath Other Clinician: Referring Physician: Barbette ReichmannHande, Vishwanath Treating Physician/Extender: Altamese CarolinaOBSON, MICHAEL G Weeks in Treatment: 0 Primary Learner Assessed: Patient Learning Preferences/Education Level/Primary Language Learning Preference: Explanation, Demonstration Highest Education Level: College or Above Preferred Language: English Cognitive Barrier Assessment/Beliefs Language Barrier: No Translator Needed: No Memory Deficit: No Emotional Barrier: No Physical Barrier Assessment Impaired Vision: No 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 Hand dexterity: No Knowledge/Comprehension Assessment Knowledge Level: High Comprehension Level: High Ability to understand written High instructions: Ability to understand verbal High instructions: Motivation Assessment Anxiety Level: Calm Cooperation: Cooperative Education Importance: Acknowledges Need Interest in Health Problems: Asks Questions Perception: Coherent Willingness to Engage in Self- High Management Activities: Readiness to Engage in Self- High Management Activities: Electronic Signature(s) Signed: 05/24/2016 3:51:22 PM By: Elliot GurneyWoody, RN, BSN, Kim RN, BSN Gullick, Nemiah CommanderPAUL V. ([id number]) Entered By: Elliot GurneyWoody, RN, BSN, Kim on 05/24/2016 08:43:55 Gene Brown, Nils V.  ([id number]) -------------------------------------------------------------------------------- Fall Risk Assessment Details Patient Name: Gene Brown, Gene V. Date of Service: 05/24/2016 8:00 AM Medical Record Number: [id number] Patient Account Number: [id number] Date of Birth/Sex: 06/04/1951 [age] y.o. Male) Treating RN: Huel CoventryWoody, Kim Primary Care Physician: Barbette ReichmannHande, Vishwanath Other Clinician: Referring Physician: Barbette ReichmannHande, Vishwanath Treating Physician/Extender: Maxwell CaulOBSON, MICHAEL G Weeks in Treatment: 0 Fall Risk Assessment Items Have you had 2 or more falls in the last 12 monthso 0 No Have you had any fall that resulted in injury in the last 12 monthso 0 No FALL RISK ASSESSMENT: History of falling - immediate  or within 3 months 0 No Secondary diagnosis 0 No Ambulatory aid None/bed rest/wheelchair/nurse 0 Yes Crutches/cane/walker 0 No Furniture 0 No IV Access/Saline Lock 0 No Gait/Training Normal/bed rest/immobile 0 Yes Weak 0 No Impaired 0 No Mental Status Oriented to own ability 0 Yes Electronic Signature(s) Signed: 05/24/2016 3:51:22 PM By: Elliot Gurney, RN, BSN, Kim RN, BSN Entered By: Elliot Gurney, RN, BSN, Kim on 05/24/2016 08:44:21 Gene Brown ([id number]) -------------------------------------------------------------------------------- Nutrition Risk Assessment Details Patient Name: Gene Brown Date of Service: 05/24/2016 8:00 AM Medical Record Number: [id number] Patient Account Number: [id number] Date of Birth/Sex: 1950-10-10 (65 y.o. Male) Treating RN: Huel Coventry Primary Care Physician: Barbette Reichmann Other Clinician: Referring Physician: Barbette Reichmann Treating Physician/Extender: Maxwell Caul Weeks in Treatment: 0 Height (in): 68 Weight (lbs): 245 Body Mass Index (BMI): 37.2 Nutrition Risk Assessment Items NUTRITION RISK SCREEN: I have an illness or condition that made me change the kind and/or 0 No amount of food I eat I eat fewer than two meals per day 0 No I  eat few fruits and vegetables, or milk products 0 No I have three or more drinks of beer, liquor or wine almost every day 0 No I have tooth or mouth problems that make it hard for me to eat 0 No I don't always have enough money to buy the food I need 0 No I eat alone most of the time 0 No I take three or more different prescribed or over-the-counter drugs a 0 No day Without wanting to, I have lost or gained 10 pounds in the last six 0 No months I am not always physically able to shop, cook and/or feed myself 0 No Nutrition Protocols Good Risk Protocol Provide education on elevated blood sugars Moderate Risk Protocol 0 and impact on wound healing, as applicable Electronic Signature(s) Signed: 05/24/2016 3:51:22 PM By: Elliot Gurney, RN, BSN, Kim RN, BSN Entered By: Elliot Gurney, RN, BSN, Kim on 05/24/2016 08:44:36

## 2016-05-25 NOTE — Progress Notes (Signed)
KISHAN, WACHSMUTH ([id number]) Visit Report for 05/24/2016 Allergy List Details Patient Name: Gene Brown, Gene Brown Date of Service: 05/24/2016 8:00 AM Medical Record Number: [id number] Patient Account Number: [id number] Date of Birth/Sex: Jun 04, 1951 (65 y.o. Male) Treating RN: Huel Coventry Primary Care Physician: Barbette Reichmann Other Clinician: Referring Physician: Barbette Reichmann Treating Physician/Extender: Maxwell Caul Weeks in Treatment: 0 Allergies Active Allergies No Known Drug Allergies Allergy Notes Electronic Signature(s) Signed: 05/24/2016 3:51:22 PM By: Elliot Gurney, RN, BSN, Kim RN, BSN Entered By: Elliot Gurney, RN, BSN, Kim on 05/24/2016 08:45:58 Gene Brown ([id number]) -------------------------------------------------------------------------------- Arrival Information Details Patient Name: Gene Brown Date of Service: 05/24/2016 8:00 AM Medical Record Number: [id number] Patient Account Number: [id number] Date of Birth/Sex: 1951/05/17 (65 y.o. Male) Treating RN: Huel Coventry Primary Care Physician: Barbette Reichmann Other Clinician: Referring Physician: Barbette Reichmann Treating Physician/Extender: Altamese Bryan in Treatment: 0 Visit Information Patient Arrived: Ambulatory Arrival Time: 08:21 Accompanied By: self Transfer Assistance: None Patient Identification Verified: Yes Secondary Verification Process Yes Completed: Patient Requires Transmission- No Based Precautions: Patient Has Alerts: Yes Patient Alerts: Patient on Blood Thinner Aspirin 325mg  Type II Diabetic 9/17 A1c 10 ABI non- compressible (L) Electronic Signature(s) Signed: 05/24/2016 3:51:22 PM By: Elliot Gurney, RN, BSN, Kim RN, BSN Entered By: Elliot Gurney, RN, BSN, Kim on 05/24/2016 08:30:29 Gene Brown ([id number]) -------------------------------------------------------------------------------- Clinic Level of Care Assessment Details Patient Name: Gene Brown Date of Service:  05/24/2016 8:00 AM Medical Record Number: [id number] Patient Account Number: [id number] Date of Birth/Sex: 02/04/1951 (65 y.o. Male) Treating RN: Huel Coventry Primary Care Physician: Barbette Reichmann Other Clinician: Referring Physician: Barbette Reichmann Treating Physician/Extender: Maxwell Caul Weeks in Treatment: 0 Clinic Level of Care Assessment Items TOOL 1 Quantity Score []  - Use when EandM and Procedure is performed on INITIAL visit 0 ASSESSMENTS - Nursing Assessment / Reassessment X - General Physical Exam (combine w/ comprehensive assessment (listed just 1 20 below) when performed on new pt. evals) X - Comprehensive Assessment (HX, ROS, Risk Assessments, Wounds Hx, etc.) 1 25 ASSESSMENTS - Wound and Skin Assessment / Reassessment []  - Dermatologic / Skin Assessment (not related to wound area) 0 ASSESSMENTS - Ostomy and/or Continence Assessment and Care []  - Incontinence Assessment and Management 0 []  - Ostomy Care Assessment and Management (repouching, etc.) 0 PROCESS - Coordination of Care X - Simple Patient / Family Education for ongoing care 1 15 []  - Complex (extensive) Patient / Family Education for ongoing care 0 X - Staff obtains Chiropractor, Records, Test Results / Process Orders 1 10 []  - Staff telephones HHA, Nursing Homes / Clarify orders / etc 0 []  - Routine Transfer to another Facility (non-emergent condition) 0 []  - Routine Hospital Admission (non-emergent condition) 0 X - New Admissions / Manufacturing engineer / Ordering NPWT, Apligraf, etc. 1 15 []  - Emergency Hospital Admission (emergent condition) 0 PROCESS - Special Needs []  - Pediatric / Minor Patient Management 0 []  - Isolation Patient Management 0 RUSTY, VILLELLA ([id number]) []  - Hearing / Language / Visual special needs 0 []  - Assessment of Community assistance (transportation, D/C planning, etc.) 0 []  - Additional assistance / Altered mentation 0 []  - Support Surface(s) Assessment (bed,  cushion, seat, etc.) 0 INTERVENTIONS - Miscellaneous []  - External ear exam 0 []  - Patient Transfer (multiple staff / Nurse, adult / Similar devices) 0 []  - Simple Staple / Suture removal (25 or less) 0 []  - Complex Staple / Suture removal (26 or more) 0 []  - Hypo/Hyperglycemic Management (do not check  if billed separately) 0 X - Ankle / Brachial Index (ABI) - do not check if billed separately 1 15 Has the patient been seen at the hospital within the last three years: Yes Total Score: 100 Level Of Care: New/Established - Level 3 Electronic Signature(s) Signed: 05/24/2016 3:51:22 PM By: Elliot Gurney, RN, BSN, Kim RN, BSN Entered By: Elliot Gurney, RN, BSN, Kim on 05/24/2016 09:02:33 Gene Brown ([id number]) -------------------------------------------------------------------------------- Encounter Discharge Information Details Patient Name: Gene Brown Date of Service: 05/24/2016 8:00 AM Medical Record Number: [id number] Patient Account Number: [id number] Date of Birth/Sex: 04/11/1951 (65 y.o. Male) Treating RN: Huel Coventry Primary Care Physician: Barbette Reichmann Other Clinician: Referring Physician: Barbette Reichmann Treating Physician/Extender: Altamese Cane Beds in Treatment: 0 Encounter Discharge Information Items Discharge Pain Level: 0 Discharge Condition: Stable Ambulatory Status: Ambulatory Discharge Destination: Home Transportation: Private Auto Accompanied By: self Schedule Follow-up Appointment: Yes Medication Reconciliation completed and provided to Patient/Care Yes Veleka Djordjevic: Provided on Clinical Summary of Care: 05/24/2016 Form Type Recipient Paper Patient PG Electronic Signature(s) Signed: 05/24/2016 3:51:22 PM By: Elliot Gurney, RN, BSN, Kim RN, BSN Previous Signature: 05/24/2016 9:22:28 AM Version By: Gwenlyn Perking Entered By: Elliot Gurney RN, BSN, Kim on 05/24/2016 09:27:31 Gene Brown  ([id number]) -------------------------------------------------------------------------------- Lower Extremity Assessment Details Patient Name: Gene Brown Date of Service: 05/24/2016 8:00 AM Medical Record Number: [id number] Patient Account Number: [id number] Date of Birth/Sex: November 11, 1950 (65 y.o. Male) Treating RN: Huel Coventry Primary Care Physician: Barbette Reichmann Other Clinician: Referring Physician: Barbette Reichmann Treating Physician/Extender: Maxwell Caul Weeks in Treatment: 0 Edema Assessment Assessed: [Left: No] [Right: No] Edema: [Left: N] [Right: o] Vascular Assessment Pulses: Posterior Tibial Palpable: [Left:Yes] Dorsalis Pedis Palpable: [Left:Yes] Extremity colors, hair growth, and conditions: Extremity Color: [Left:Normal] Hair Growth on Extremity: [Left:Yes] Temperature of Extremity: [Left:Warm] Capillary Refill: [Left:< 3 seconds] Dependent Rubor: [Left:No] Blanched when Elevated: [Left:No] Lipodermatosclerosis: [Left:No] Toe Nail Assessment Left: Right: Thick: No Discolored: No Deformed: No Improper Length and Hygiene: No Notes Patient is non compressible >220 (B-140 DP 180 PT 220 = 1.57) Electronic Signature(s) Signed: 05/24/2016 3:51:22 PM By: Elliot Gurney, RN, BSN, Kim RN, BSN Entered By: Elliot Gurney, RN, BSN, Kim on 05/24/2016 08:31:19 Gene Brown ([id number]) -------------------------------------------------------------------------------- Multi Wound Chart Details Patient Name: Gene Brown Date of Service: 05/24/2016 8:00 AM Medical Record Number: [id number] Patient Account Number: [id number] Date of Birth/Sex: 1950-11-28 (65 y.o. Male) Treating RN: Huel Coventry Primary Care Physician: Barbette Reichmann Other Clinician: Referring Physician: Barbette Reichmann Treating Physician/Extender: Maxwell Caul Weeks in Treatment: 0 Vital Signs Height(in): 68 Pulse(bpm): 73 Weight(lbs): 245 Blood Pressure 156/84 (mmHg): Body Mass  Index(BMI): 37 Temperature(F): Respiratory Rate 18 (breaths/min): Photos: [N/A:N/A] Wound Location: Left Toe Great - Plantar N/A N/A Wounding Event: Blister N/A N/A Primary Etiology: Diabetic Wound/Ulcer of N/A N/A the Lower Extremity Secondary Etiology: Pressure Ulcer N/A N/A Comorbid History: Cataracts, Sickle Cell N/A N/A Disease, Sleep Apnea, Hypertension, Type II Diabetes, Gout, Osteoarthritis Date Acquired: 03/09/2015 N/A N/A Weeks of Treatment: 0 N/A N/A Wound Status: Open N/A N/A Measurements L x W x D 1x0.5x0.1 N/A N/A (cm) Area (cm) : 0.393 N/A N/A Volume (cm) : 0.039 N/A N/A Starting Position 1 2 (o'clock): Ending Position 1 6 (o'clock): Maximum Distance 1 0.6 (cm): TRAMPUS, MCQUERRY V. ([id number]) Undermining: Yes N/A N/A Classification: Grade 2 N/A N/A Exudate Amount: None Present N/A N/A Wound Margin: Flat and Intact N/A N/A Granulation Amount: Medium (34-66%) N/A N/A Granulation Quality: Pale N/A N/A Necrotic Amount: Medium (34-66%) N/A N/A Exposed Structures: Fascia: No  N/A N/A Fat: No Tendon: No Muscle: No Joint: No Bone: No Limited to Skin Breakdown Epithelialization: Medium (34-66%) N/A N/A Periwound Skin Texture: Callus: Yes N/A N/A Edema: No Excoriation: No Induration: No Crepitus: No Fluctuance: No Friable: No Rash: No Scarring: No Periwound Skin Maceration: No N/A N/A Moisture: Moist: No Dry/Scaly: No Periwound Skin Color: Atrophie Blanche: No N/A N/A Cyanosis: No Ecchymosis: No Erythema: No Hemosiderin Staining: No Mottled: No Pallor: No Rubor: No Temperature: No Abnormality N/A N/A Tenderness on Yes N/A N/A Palpation: Wound Preparation: Ulcer Cleansing: Not N/A N/A Cleansed Topical Anesthetic Applied: Other: lidociane 4% Treatment Notes Electronic Signature(s) SOMA, LIZAK ([id number]) Signed: 05/24/2016 3:51:22 PM By: Elliot Gurney, RN, BSN, Kim RN, BSN Entered By: Elliot Gurney, RN, BSN, Kim on 05/24/2016  08:57:48 Gene Brown ([id number]) -------------------------------------------------------------------------------- Multi-Disciplinary Care Plan Details Patient Name: Gene Brown Date of Service: 05/24/2016 8:00 AM Medical Record Number: [id number] Patient Account Number: [id number] Date of Birth/Sex: 04-19-1951 (65 y.o. Male) Treating RN: Huel Coventry Primary Care Physician: Barbette Reichmann Other Clinician: Referring Physician: Barbette Reichmann Treating Physician/Extender: Altamese Mucarabones in Treatment: 0 Active Inactive Nutrition Nursing Diagnoses: Potential for alteratiion in Nutrition/Potential for imbalanced nutrition Goals: Patient/caregiver will maintain therapeutic glucose control Date Initiated: 05/24/2016 Goal Status: Active Interventions: Assess HgA1c results as ordered upon admission and as needed Treatment Activities: Patient referred to diabetes educator : 05/24/2016 Notes: Orientation to the Wound Care Program Nursing Diagnoses: Knowledge deficit related to the wound healing center program Goals: Patient/caregiver will verbalize understanding of the Wound Healing Center Program Date Initiated: 05/24/2016 Goal Status: Active Interventions: Provide education on orientation to the wound center Notes: Pressure Nursing Diagnoses: Potential for impaired tissue integrity related to pressure, friction, moisture, and shear SAMMIE, DENNER ([id number]) Goals: Patient will remain free from development of additional pressure ulcers Date Initiated: 05/24/2016 Goal Status: Active Patient/caregiver will verbalize risk factors for pressure ulcer development Date Initiated: 05/24/2016 Goal Status: Active Interventions: Assess: immobility, friction, shearing, incontinence upon admission and as needed Provide education on pressure ulcers Notes: Wound/Skin Impairment Nursing Diagnoses: Impaired tissue integrity Goals: Patient/caregiver will verbalize  understanding of skin care regimen Date Initiated: 05/24/2016 Goal Status: Active Ulcer/skin breakdown will heal within 14 weeks Date Initiated: 05/24/2016 Goal Status: Active Interventions: Assess patient/caregiver ability to obtain necessary supplies Notes: Electronic Signature(s) Signed: 05/24/2016 3:51:22 PM By: Elliot Gurney, RN, BSN, Kim RN, BSN Entered By: Elliot Gurney, RN, BSN, Kim on 05/24/2016 08:57:20 Gene Brown ([id number]) -------------------------------------------------------------------------------- Pain Assessment Details Patient Name: Gene Brown Date of Service: 05/24/2016 8:00 AM Medical Record Number: [id number] Patient Account Number: [id number] Date of Birth/Sex: 1951-05-20 (65 y.o. Male) Treating RN: Huel Coventry Primary Care Physician: Barbette Reichmann Other Clinician: Referring Physician: Barbette Reichmann Treating Physician/Extender: Maxwell Caul Weeks in Treatment: 0 Active Problems Location of Pain Severity and Description of Pain Patient Has Paino No Site Locations With Dressing Change: No Pain Management and Medication Current Pain Management: Goals for Pain Management Topical or injectable lidocaine is offered to patient for acute pain when surgical debridement is performed. If needed, Patient is instructed to use over the counter pain medication for the following 24-48 hours after debridement. Wound care MDs do not prescribed pain medications. Patient has chronic pain or uncontrolled pain. Patient has been instructed to make an appointment with their Primary Care Physician for pain management. Electronic Signature(s) Signed: 05/24/2016 3:51:22 PM By: Elliot Gurney, RN, BSN, Kim RN, BSN Entered By: Elliot Gurney, RN, BSN, Kim on 05/24/2016 53:66:44 Gene Brown ([id number]) -------------------------------------------------------------------------------- Patient/Caregiver  Education Details Patient Name: Gene BangGRIBBONS, Esai V. Date of Service: 05/24/2016 8:00  AM Medical Record Number: [id number] Patient Account Number: [id number] Date of Birth/Gender: 12/30/1950 (65 y.o. Male) Treating RN: Huel CoventryWoody, Kim Primary Care Physician: Barbette ReichmannHande, Vishwanath Other Clinician: Referring Physician: Barbette ReichmannHande, Vishwanath Treating Physician/Extender: Altamese CarolinaOBSON, MICHAEL G Weeks in Treatment: 0 Education Assessment Education Provided To: Patient Education Topics Provided Wound/Skin Impairment: Handouts: Caring for Your Ulcer, Other: wound care as prescribed Methods: Demonstration Responses: State content correctly Electronic Signature(s) Signed: 05/24/2016 3:51:22 PM By: Elliot GurneyWoody, RN, BSN, Kim RN, BSN Entered By: Elliot GurneyWoody, RN, BSN, Kim on 05/24/2016 09:27:52 Gene BangGRIBBONS, Clemon V. ([id number]) -------------------------------------------------------------------------------- Wound Assessment Details Patient Name: Gene BangGRIBBONS, Katrell V. Date of Service: 05/24/2016 8:00 AM Medical Record Number: [id number] Patient Account Number: [id number] Date of Birth/Sex: 11/02/1950 (65 y.o. Male) Treating RN: Huel CoventryWoody, Kim Primary Care Physician: Barbette ReichmannHande, Vishwanath Other Clinician: Referring Physician: Barbette ReichmannHande, Vishwanath Treating Physician/Extender: Maxwell CaulOBSON, MICHAEL G Weeks in Treatment: 0 Wound Status Wound Number: 1 Primary Diabetic Wound/Ulcer of the Lower Etiology: Extremity Wound Location: Left Toe Great - Plantar Secondary Pressure Ulcer Wounding Event: Blister Etiology: Date Acquired: 03/09/2015 Wound Open Weeks Of Treatment: 0 Status: Clustered Wound: No Comorbid Cataracts, Sickle Cell Disease, Sleep History: Apnea, Hypertension, Type II Diabetes, Gout, Osteoarthritis Photos Wound Measurements Length: (cm) 1 Width: (cm) 0.5 Depth: (cm) 0.1 Area: (cm) 0.393 Volume: (cm) 0.039 % Reduction in Area: 0% % Reduction in Volume: 0% Epithelialization: Medium (34-66%) Tunneling: No Undermining: Yes Starting Position (o'clock): 2 Ending Position (o'clock): 6 Maximum Distance: (cm)  0.6 Wound Description Classification: Grade 2 Wound Margin: Flat and Intact Exudate Amount: None Present Wound Bed Granulation Amount: Medium (34-66%) Exposed Structure Gene BangGRIBBONS, Ianmichael V. ([id number]) Granulation Quality: Pale Fascia Exposed: No Necrotic Amount: Medium (34-66%) Fat Layer Exposed: No Necrotic Quality: Adherent Slough Tendon Exposed: No Muscle Exposed: No Joint Exposed: No Bone Exposed: No Limited to Skin Breakdown Periwound Skin Texture Texture Color No Abnormalities Noted: No No Abnormalities Noted: No Callus: Yes Atrophie Blanche: No Crepitus: No Cyanosis: No Excoriation: No Ecchymosis: No Fluctuance: No Erythema: No Friable: No Hemosiderin Staining: No Induration: No Mottled: No Localized Edema: No Pallor: No Rash: No Rubor: No Scarring: No Temperature / Pain Moisture Temperature: No Abnormality No Abnormalities Noted: No Tenderness on Palpation: Yes Dry / Scaly: No Maceration: No Moist: No Wound Preparation Ulcer Cleansing: Not Cleansed Topical Anesthetic Applied: Other: lidociane 4%, Treatment Notes Wound #1 (Left, Plantar Toe Great) 1. Cleansed with: Clean wound with Normal Saline 2. Anesthetic Topical Lidocaine 4% cream to wound bed prior to debridement 4. Dressing Applied: Prisma Ag 6. Footwear/Offloading device applied Surgical shoe Notes felt, gauze, conform Electronic Signature(s) Signed: 05/24/2016 3:51:22 PM By: Elliot GurneyWoody, RN, BSN, Kim RN, BSN Entered By: Elliot GurneyWoody, RN, BSN, Kim on 05/24/2016 09:06:30 Gene BangGRIBBONS, Havard V. ([id number]) Gene BangGRIBBONS, Blease V. ([id number]) -------------------------------------------------------------------------------- Vitals Details Patient Name: Gene BangGRIBBONS, Marko V. Date of Service: 05/24/2016 8:00 AM Medical Record Number: [id number] Patient Account Number: [id number] Date of Birth/Sex: 04/03/1951 (65 y.o. Male) Treating RN: Huel CoventryWoody, Kim Primary Care Physician: Barbette ReichmannHande, Vishwanath Other Clinician: Referring  Physician: Barbette ReichmannHande, Vishwanath Treating Physician/Extender: Maxwell CaulOBSON, MICHAEL G Weeks in Treatment: 0 Vital Signs Time Taken: 08:22 Pulse (bpm): 73 Height (in): 68 Respiratory Rate (breaths/min): 18 Source: Stated Blood Pressure (mmHg): 156/84 Weight (lbs): 245 Reference Range: 80 - 120 mg / dl Source: Stated Body Mass Index (BMI): 37.2 Electronic Signature(s) Signed: 05/24/2016 3:51:22 PM By: Elliot GurneyWoody, RN, BSN, Kim RN, BSN Entered By: Elliot GurneyWoody, RN, BSN, Kim on 05/24/2016 08:22:40

## 2016-05-25 NOTE — Progress Notes (Signed)
Gene Brown, Gene Brown ([id number]) Visit Report for 05/24/2016 Chief Complaint Document Details Patient Name: Gene Brown, Gene Brown Date of Service: 05/24/2016 8:00 AM Medical Record Number: [id number] Patient Account Number: [id number] Date of Birth/Sex: 1950/12/28 (65 y.o. Male) Treating RN: Huel Coventry Primary Care Physician: Barbette Reichmann Other Clinician: Referring Physician: Barbette Reichmann Treating Physician/Extender: Maxwell Caul Weeks in Treatment: 0 Information Obtained from: Patient Chief Complaint 05/24/16 patient is here for review of the wound on his plantar left great toe that is been present off and on for a year Electronic Signature(s) Signed: 05/25/2016 8:01:59 AM By: Baltazar Najjar MD Entered By: Baltazar Najjar on 05/24/2016 09:35:52 Gene Brown ([id number]) -------------------------------------------------------------------------------- Debridement Details Patient Name: Gene Brown Date of Service: 05/24/2016 8:00 AM Medical Record Number: [id number] Patient Account Number: [id number] Date of Birth/Sex: 07/18/1951 (65 y.o. Male) Treating RN: Huel Coventry Primary Care Physician: Barbette Reichmann Other Clinician: Referring Physician: Barbette Reichmann Treating Physician/Extender: Maxwell Caul Weeks in Treatment: 0 Debridement Performed for Wound #1 Left,Plantar Toe Great Assessment: Performed By: Physician Maxwell Caul, MD Debridement: Debridement Pre-procedure Yes - 08:55 Verification/Time Out Taken: Start Time: 08:56 Pain Control: Other : lidocaine 4% Level: Skin/Subcutaneous Tissue Total Area Debrided (L x 1 (cm) x 0.5 (cm) = 0.5 (cm) W): Tissue and other Viable, Non-Viable, Callus, Fibrin/Slough, Skin, Subcutaneous material debrided: Instrument: Blade, Forceps Bleeding: Moderate Hemostasis Achieved: Pressure End Time: 08:59 Procedural Pain: 0 Post Procedural Pain: 0 Response to Treatment: Procedure was tolerated well Post  Debridement Measurements of Total Wound Length: (cm) 1 Width: (cm) 0.5 Depth: (cm) 0.1 Volume: (cm) 0.039 Character of Wound/Ulcer Post Requires Further Debridement Debridement: Severity of Tissue Post Debridement: Fat layer exposed Post Procedure Diagnosis Same as Pre-procedure Electronic Signature(s) Signed: 05/24/2016 3:51:22 PM By: Elliot Gurney, RN, BSN, Kim RN, BSN Signed: 05/25/2016 8:01:59 AM By: Baltazar Najjar MD Entered By: Baltazar Najjar on 05/24/2016 09:35:12 Gene Brown ([name] ([id number]) -------------------------------------------------------------------------------- HPI Details Patient Name: Gene Brown Date of Service: 05/24/2016 8:00 AM Medical Record Number: [id number] Patient Account Number: [id number] Date of Birth/Sex: 05-Dec-1950 (65 y.o. Male) Treating RN: Huel Coventry Primary Care Physician: Barbette Reichmann Other Clinician: Referring Physician: Barbette Reichmann Treating Physician/Extender: Maxwell Caul Weeks in Treatment: 0 History of Present Illness HPI Description: 05/24/16; this is a 65 year old type II diabetic who probably has some degree of polyneuropathy. He tells me that he has had an area on the plantar aspect of his left great toe that is been followed by podiatry for most the last year. He has a lot of callus, the podiatrist which shave this down put a dressing on and he would follow pad monthly or sometimes longer intervals. He has not noted any drainage or pain. He has not had a recent x-ray. No serious attempt at offloading this with modified footwear.. Last hemoglobin A1c that he knows about was over 10 a month ago he is working hard on this with his primary physician. More recently he has been soaking this with peroxide ABIs are noncompressible and the left leg. He was told by his podiatrist he has a bone spur underneath the wound but did not wish any surgical procedures. He has a caregiver at home for a  very disabled wife, the nature of her illness is not certain Electronic Signature(s) Signed: 05/25/2016 8:01:59 AM By: Baltazar Najjar MD Entered By: Baltazar Najjar on 05/24/2016 09:43:38 Gene Brown ([id number]) -------------------------------------------------------------------------------- Physical Exam Details Patient Name: Gene Brown Date of Service: 05/24/2016 8:00 AM  Medical Record Number: [id number] Patient Account Number: [id number] Date of Birth/Sex: 03/08/1951 [age] y.o. Male) Treating RN: Huel CoventryWoody, Kim Primary Care Physician: Barbette ReichmannHande, Vishwanath Other Clinician: Referring Physician: Barbette ReichmannHande, Vishwanath Treating Physician/Extender: Maxwell CaulOBSON, MICHAEL G Weeks in Treatment: 0 Constitutional Sitting or standing Blood Pressure is within target range for patient.. Pulse regular and within target range for patient.Marland Kitchen. Respirations regular, non-labored and within target range.. Temperature is normal and within the target range for the patient.. Patient's appearance is neat and clean. Appears in no acute distress. Well nourished and well developed.. Eyes Conjunctivae clear. No discharge.Marland Kitchen. Respiratory Respiratory effort is easy and symmetric bilaterally. Rate is normal at rest and on room air.. Bilateral breath sounds are clear and equal in all lobes with no wheezes, rales or rhonchi.. Cardiovascular Heart rhythm and rate regular, without murmur or gallop.. Femoral arteries without bruits and pulses strong.. Pedal pulses palpable and strong bilaterally. Capillary refill time is normal. Lymphatic None palpable in the popliteal or inguinal area. Musculoskeletal No overt abnormality in the interphalangeal joint of the left toe. Neurological Sensation testing to the microfilament test was normal however he has some loss of vibration sense in the left foot. Psychiatric No evidence of depression, anxiety, or agitation. Calm, cooperative, and communicative. Appropriate interactions and  affect.. Notes Wound exam; the areas on the plantar aspect of left great toe roughly at the level of the interphalangeal joint. This had thick adherent callus some nonviable subcutaneous tissue all of this removed with pickups and scalpel. Hemostasis with silver nitrate. There is really no evidence of infection in the wound though cultures were done. Although his ABIs were noncompressible in the clinic is pulses are good in both the dorsalis pedis and posterior tibial and scapular refill time is normal. Electronic Signature(s) Signed: 05/25/2016 8:01:59 AM By: Baltazar Najjarobson, Michael MD Entered By: Baltazar Najjarobson, Michael on 05/24/2016 09:42:48 Gene BangGRIBBONS, Gene V. ([id number]) -------------------------------------------------------------------------------- Physician Orders Details Patient Name: Gene BangGRIBBONS, Gene V. Date of Service: 05/24/2016 8:00 AM Medical Record Number: [id number] Patient Account Number: [id number] Date of Birth/Sex: 04/06/1951 [age] y.o. Male) Treating RN: Huel CoventryWoody, Kim Primary Care Physician: Barbette ReichmannHande, Vishwanath Other Clinician: Referring Physician: Barbette ReichmannHande, Vishwanath Treating Physician/Extender: Altamese CarolinaOBSON, MICHAEL G Weeks in Treatment: 0 Verbal / Phone Orders: Yes Clinician: Huel CoventryWoody, Kim Read Back and Verified: Yes Diagnosis Coding Wound Cleansing Wound #1 Left,Plantar Toe Great o Clean wound with wound cleanser. Anesthetic Wound #1 Left,Plantar Toe Great o Topical Lidocaine 4% cream applied to wound bed prior to debridement Primary Wound Dressing Wound #1 Left,Plantar Toe Great o Prisma Ag - felt surrounding wound Secondary Dressing Wound #1 Left,Plantar Toe Great o Non-adherent pad - self adherent bandage Dressing Change Frequency Wound #1 Left,Plantar Toe Great o Change Dressing Monday, Wednesday, Friday Follow-up Appointments Wound #1 Left,Plantar Toe Great o Return Appointment in 1 week. Edema Control Wound #1 Left,Plantar Toe Great o Elevate legs to the level of  the heart and pump ankles as often as possible Off-Loading Wound #1 Left,Plantar Toe Great o Open toe surgical shoe with peg assist. Additional Orders / Instructions [id number]Plantar Toe Merry ProudGreat Dulude, Kj V. ([id number]) o Increase protein intake. Radiology o X-ray, toes - Left Great Toe Electronic Signature(s) Signed: 05/24/2016 3:51:22 PM By: Elliot GurneyWoody, RN, BSN, Kim RN, BSN Signed: 05/25/2016 8:01:59 AM By: Baltazar Najjarobson, Michael MD Entered By: Elliot GurneyWoody, RN, BSN, Kim on 05/24/2016 09:24:55 Gene BangGRIBBONS, Gene V. ([id number]) -------------------------------------------------------------------------------- Problem List Details Patient Name: Gene BangGRIBBONS, Gene V. Date of Service: 05/24/2016 8:00 AM Medical Record Number: [id number] Patient Account Number: [id number] Date of Birth/Sex:  02/21/1951 (65 y.o. 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 Male) Treating RN: Huel Coventry Primary Care Physician: Barbette Reichmann Other Clinician: Referring Physician: Barbette Reichmann Treating Physician/Extender: Maxwell Caul Weeks in Treatment: 0 Subjective Chief Complaint Information obtained from Patient 05/24/16 patient is here for review of the wound on his plantar left great toe that is been present off and on for a year History of Present Illness (HPI) 05/24/16; this is a 65 year old type II diabetic who probably has some degree of polyneuropathy. He tells me that he has had an area on the plantar aspect of his left great toe that is been followed by podiatry for most the last year. He has a lot of callus, the podiatrist which shave this down put a dressing on and he would follow pad monthly or sometimes longer intervals. He has not noted any drainage or pain. He has not had a recent x-ray. No serious attempt at offloading this with modified footwear.. Last hemoglobin A1c that he knows about was over 10 a month ago he is working hard on this with his primary physician. More recently he has been soaking this with peroxide ABIs are noncompressible and the left leg. He was told by his podiatrist he has a bone spur underneath the wound but did not wish any surgical procedures. He has a caregiver at home for a very disabled wife, the nature of her illness is not certain Wound History Patient presents with 1 open wound that has been present for approximately 14 mos. Patient has been treating wound in the following manner: peroxide, band-aid. Laboratory tests have not been performed in the last month. Patient reportedly has not tested positive for an antibiotic resistant organism. Patient reportedly has not tested positive for osteomyelitis. Patient reportedly has not had testing performed to evaluate circulation in the legs. Patient History Information obtained from Patient, Chart. Allergies No Known Drug Allergies Family History Cancer - Father, Lung Disease - Mother, No family history of Diabetes, Heart Disease, Hypertension, Kidney Disease,  Seizures, Stroke, Thyroid Problems, Tuberculosis. Social History MATEEN, FRANSSEN ([id number]) Never smoker, Marital Status - Married, Alcohol Use - Rarely, Drug Use - No History, Caffeine Use - Daily. Medical History Eyes Patient has history of Cataracts - 1 year ago Denies history of Glaucoma, Optic Neuritis Ear/Nose/Mouth/Throat Denies history of Chronic sinus problems/congestion, Middle ear problems Hematologic/Lymphatic Patient has history of Sickle Cell Disease - Trait Denies history of Anemia, Hemophilia, Human Immunodeficiency Virus, Lymphedema Respiratory Patient has history of Sleep Apnea Denies history of Aspiration, Asthma, Chronic Obstructive Pulmonary Disease (COPD), Pneumothorax, Tuberculosis Cardiovascular Patient has history of Hypertension Denies history of Angina, Arrhythmia, Congestive Heart Failure, Coronary Artery Disease, Deep Vein Thrombosis, Hypotension, Myocardial Infarction, Peripheral Arterial Disease, Peripheral Venous Disease, Phlebitis, Vasculitis Gastrointestinal Denies history of Cirrhosis , Colitis, Crohn s,  Hepatitis A, Hepatitis B, Hepatitis C Endocrine Patient has history of Type II Diabetes Denies history of Type I Diabetes Genitourinary Denies history of End Stage Renal Disease Immunological Denies history of Lupus Erythematosus, Raynaud s, Scleroderma Integumentary (Skin) Denies history of History of Burn, History of pressure wounds Musculoskeletal Patient has history of Gout, Osteoarthritis - knees Denies history of Rheumatoid Arthritis, Osteomyelitis Neurologic Denies history of Dementia, Neuropathy, Quadriplegia, Paraplegia, Seizure Disorder Oncologic Denies history of Received Chemotherapy, Received Radiation Psychiatric Denies history of Anorexia/bulimia Patient is treated with Insulin, Oral Agents. Blood sugar is not tested. Medical And Surgical History Notes Constitutional Symptoms (General Health) Knee surgery x 4; aorta  and hernia repair; gastric bypass Review of Systems (ROS) Constitutional Symptoms (General Health) The patient has no complaints or symptoms. Eyes Gene Brown, Gene Brown ([id number]) The patient has no complaints or symptoms. Ear/Nose/Mouth/Throat The patient has no complaints or symptoms. Hematologic/Lymphatic The patient has no complaints or symptoms. Respiratory The patient has no complaints or symptoms. Cardiovascular Complains or has symptoms of LE edema - occasional. Denies complaints or symptoms of Chest pain. Gastrointestinal The patient has no complaints or symptoms. Endocrine The patient has no complaints or symptoms. Genitourinary Denies complaints or symptoms of Kidney failure/ Dialysis, Incontinence/dribbling. Immunological The patient has no complaints or symptoms. Integumentary (Skin) Complains or has symptoms of Wounds. Denies complaints or symptoms of Bleeding or bruising tendency, Breakdown, Swelling. Musculoskeletal Denies complaints or symptoms of Muscle Pain, Muscle Weakness. Neurologic The patient has no complaints or symptoms. Oncologic The patient has no complaints or symptoms. Psychiatric The patient has no complaints or symptoms. Objective Constitutional Sitting or standing Blood Pressure is within target range for patient.. Pulse regular and within target range for patient.Marland Kitchen Respirations regular, non-labored and within target range.. Temperature is normal and within the target range for the patient.. Patient's appearance is neat and clean. Appears in no acute distress. Well nourished and well developed.. Vitals Time Taken: 8:22 AM, Height: 68 in, Source: Stated, Weight: 245 lbs, Source: Stated, BMI: 37.2, Pulse: 73 bpm, Respiratory Rate: 18 breaths/min, Blood Pressure: 156/84 mmHg. Eyes Conjunctivae clear. No discharge.Marland Kitchen Gene Brown, Gene Brown ([id number]) Respiratory Respiratory effort is easy and symmetric bilaterally. Rate is normal at rest and on room  air.. Bilateral breath sounds are clear and equal in all lobes with no wheezes, rales or rhonchi.. Cardiovascular Heart rhythm and rate regular, without murmur or gallop.. Femoral arteries without bruits and pulses strong.. Pedal pulses palpable and strong bilaterally. Capillary refill time is normal. Lymphatic None palpable in the popliteal or inguinal area. Musculoskeletal No overt abnormality in the interphalangeal joint of the left toe. Neurological Sensation testing to the microfilament test was normal however he has some loss of vibration sense in the left foot. Psychiatric No evidence of depression, anxiety, or agitation. Calm, cooperative, and communicative. Appropriate interactions and affect.. General Notes: Wound exam; the areas on the plantar aspect of left great toe roughly at the level of the interphalangeal joint. This had thick adherent callus some nonviable subcutaneous tissue all of this removed with pickups and scalpel. Hemostasis with silver nitrate. There is really no evidence of infection in the wound though cultures were done. Although his ABIs were noncompressible in the clinic is pulses are good in both the dorsalis pedis and posterior tibial and scapular refill time is normal. Integumentary (Hair, Skin) Wound #1 status is Open. Original cause of wound was Blister. The wound is located on the [employer]. The wound measures 1cm length x 0.5cm width  x 0.1cm depth; 0.393cm^2 area and 0.039cm^3 volume. The wound is limited to skin breakdown. There is no tunneling noted, however, there is undermining starting at 2:00 and ending at 6:00 with a maximum distance of 0.6cm. There is a none present amount of drainage noted. The wound margin is flat and intact. There is medium (34-66%) pale granulation within the wound bed. There is a medium (34-66%) amount of necrotic tissue within the wound bed including Adherent Slough. The periwound skin appearance exhibited:  Callus. The periwound skin appearance did not exhibit: Crepitus, Excoriation, Fluctuance, Friable, Induration, Localized Edema, Rash, Scarring, Dry/Scaly, Maceration, Moist, Atrophie Blanche, Cyanosis, Ecchymosis, Hemosiderin Staining, Mottled, Pallor, Rubor, Erythema. Periwound temperature was noted as No Abnormality. The periwound has tenderness on palpation. 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 There was a Skin/Subcutaneous Tissue Debridement ([id number]) debridement with total area of 0.5 sq cm performed by Maxwell Caul, MD. with the following instrument(s): Blade and Forceps to remove Viable and Non-Viable tissue/material including Fibrin/Slough, Skin, Callus, and Subcutaneous after achieving pain control using Other (lidocaine 4%). A time out was conducted at 08:55, prior to the start of the procedure. A Moderate amount of bleeding was controlled with Pressure. The procedure was tolerated well with a pain level of 0 throughout and a pain level of 0 following the procedure. Post Debridement Measurements: 1cm length x 0.5cm width x 0.1cm depth; 0.039cm^3 volume. Character of Wound/Ulcer Post Debridement requires further debridement. Severity of Tissue Post Debridement is: Fat layer exposed. Post procedure Diagnosis Wound #1: Same as Pre-Procedure Plan Wound Cleansing: Wound #1 Left,Plantar Toe Great: Clean wound with wound cleanser. Anesthetic: Wound #1 Left,Plantar Toe Great: Topical Lidocaine 4% cream applied to wound bed prior to debridement Primary Wound Dressing: Wound #1 Left,Plantar Toe Great: Prisma Ag - felt surrounding  wound Secondary Dressing: Wound #1 Left,Plantar Toe Great: Non-adherent pad - self adherent bandage Dressing Change Frequency: Wound #1 Left,Plantar Toe Great: Change Dressing Monday, Wednesday, Friday Follow-up Appointments: Wound #1 Left,Plantar Toe Great: Return Appointment in 1 week. Edema Control: Gene Brown, Gene Brown ([id number]) Wound #1 Left,Plantar Toe Great: Elevate legs to the level of the heart and pump ankles as often as possible Off-Loading: Wound #1 Left,Plantar Toe Great: Open toe surgical shoe with peg assist. Additional Orders / Instructions: Wound #1 Left,Plantar Toe Great: Increase protein intake. Radiology ordered were: X-ray, toes - Left Great Toe #1 the patient's wound was aggressively debrided removing copious amounts of callus, nonviable subcutaneous tissue. I am not sure even with this that I got down to the bottom of the wound in its totality #2 we applied Prisma, Kerlix #3 I ordered an x-ray of the foot predominantly because of the duration of the wound and a type II diabetic with neuropathy #4 there was not any overt evidence of infection here no cultures were done #5 I'm expecting to have to do more debridement on this next week, as mentioned I don't think I really got to the full base of this wound Electronic Signature(s) Signed: 05/25/2016 8:01:59 AM By: Baltazar Najjar MD Entered By: Baltazar Najjar on 05/24/2016 09:45:51 Gene Brown ([id number]) -------------------------------------------------------------------------------- ROS/PFSH Details Patient Name: Gene Brown Date of Service: 05/24/2016  8:00 AM Medical Record Number: [id number] Patient Account Number: [id number] Date of Birth/Sex: 1951/05/12 (65 y.o. Male) Treating RN: Huel Coventry Primary Care Physician: Barbette Reichmann Other Clinician: Referring Physician: Barbette Reichmann Treating Physician/Extender: Maxwell Caul Weeks in Treatment: 0 Information Obtained  From Patient Chart Wound History Do you currently have one or more open woundso Yes How many open wounds do you currently haveo 1 Approximately how long have you had your woundso 14 mos How have you been treating your wound(s) until nowo peroxide, band-aid Has your wound(s) ever healed and then re-openedo No Have you had any lab work done in the past montho No Have you tested positive for an antibiotic resistant organism (MRSA, VRE)o No Have you tested positive for osteomyelitis (bone infection)o No Have you had any tests for circulation on your legso No Cardiovascular Complaints and Symptoms: Positive for: LE edema - occasional Negative for: Chest pain Medical History: Positive for: Hypertension Negative for: Angina; Arrhythmia; Congestive Heart Failure; Coronary Artery Disease; Deep Vein Thrombosis; Hypotension; Myocardial Infarction; Peripheral Arterial Disease; Peripheral Venous Disease; Phlebitis; Vasculitis Genitourinary Complaints and Symptoms: Negative for: Kidney failure/ Dialysis; Incontinence/dribbling Medical History: Negative for: End Stage Renal Disease Integumentary (Skin) Complaints and Symptoms: Positive for: Wounds Negative for: Bleeding or bruising tendency; Breakdown; Swelling Medical History: Negative for: History of Burn; History of pressure wounds TIMON, GEISSINGER ([id number]) Musculoskeletal Complaints and Symptoms: Negative for: Muscle Pain; Muscle Weakness Medical History: Positive for: Gout; Osteoarthritis - knees Negative for: Rheumatoid Arthritis; Osteomyelitis Constitutional Symptoms (General Health) Complaints and Symptoms: No Complaints or Symptoms Medical History: Past Medical History Notes: Knee surgery x 4; aorta and hernia repair; gastric bypass Eyes Complaints and Symptoms: No Complaints or Symptoms Medical History: Positive for: Cataracts - 1 year ago Negative for: Glaucoma; Optic Neuritis Ear/Nose/Mouth/Throat Complaints and  Symptoms: No Complaints or Symptoms Medical History: Negative for: Chronic sinus problems/congestion; Middle ear problems Hematologic/Lymphatic Complaints and Symptoms: No Complaints or Symptoms Medical History: Positive for: Sickle Cell Disease - Trait Negative for: Anemia; Hemophilia; Human Immunodeficiency Virus; Lymphedema Respiratory Complaints and Symptoms: No Complaints or Symptoms Medical History: Positive for: Sleep Apnea Negative for: Aspiration; Asthma; Chronic Obstructive Pulmonary Disease (COPD); Pneumothorax; ANANT, AGARD ([id number]) Tuberculosis Gastrointestinal Complaints and Symptoms: No Complaints or Symptoms Medical History: Negative for: Cirrhosis ; Colitis; Crohnos; Hepatitis A; Hepatitis B; Hepatitis C Endocrine Complaints and Symptoms: No Complaints or Symptoms Medical History: Positive for: Type II Diabetes Negative for: Type I Diabetes Time with diabetes: 1995 Treated with: Insulin, Oral agents Blood sugar tested every day: No Immunological Complaints and Symptoms: No Complaints or Symptoms Medical History: Negative for: Lupus Erythematosus; Raynaudos; Scleroderma Neurologic Complaints and Symptoms: No Complaints or Symptoms Medical History: Negative for: Dementia; Neuropathy; Quadriplegia; Paraplegia; Seizure Disorder Oncologic Complaints and Symptoms: No Complaints or Symptoms Medical History: Negative for: Received Chemotherapy; Received Radiation Psychiatric Complaints and Symptoms: No Complaints or Symptoms ICHIRO, CHESNUT ([id number]) Medical History: Negative for: Anorexia/bulimia HBO Extended History Items Eyes: Cataracts Immunizations Pneumococcal Vaccine: Received Pneumococcal Vaccination: Yes Family and Social History Cancer: Yes - Father; Diabetes: No; Heart Disease: No; Hypertension: No; Kidney Disease: No; Lung Disease: Yes - Mother; Seizures: No; Stroke: No; Thyroid Problems: No; Tuberculosis: No; Never  smoker; Marital Status - Married; Alcohol Use: Rarely; Drug Use: No History; Caffeine Use: Daily; Advanced Directives: No; Patient does not want information on Advanced Directives; Do not resuscitate: No; Living Will: No; Medical Power of Attorney: No Electronic Signature(s) Signed: 05/24/2016 3:51:22 PM By: Elliot Gurney, RN, BSN, Kim RN,  BSN Signed: 05/25/2016 8:01:59 AM By: Baltazar Najjar MD Entered By: Elliot Gurney, RN, BSN, Kim on 05/24/2016 08:40:14 Gene Brown ([id number]) -------------------------------------------------------------------------------- SuperBill Details Patient Name: Gene Brown Date of Service: 05/24/2016 Medical Record Number: [id number] Patient Account Number: [id number] Date of Birth/Sex: Sep 17, 1950 (65 y.o. 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 Facility Procedures CPT4 Code: 78295621 Description: 99213 - WOUND CARE VISIT-LEV 3 EST PT Modifier: Quantity: 1 CPT4 Code: 30865784 Description: 11042 - DEB SUBQ TISSUE 20 SQ CM/< ICD-10 Description Diagnosis E11.621 Type 2 diabetes mellitus with foot ulcer Modifier: Quantity: 1 Physician Procedures CPT4 Code: 6962952 Description: WC PHYS LEVEL 3 o NEW PT ICD-10 Description Diagnosis E11.621 Type 2 diabetes mellitus with foot ulcer Modifier: Quantity: 1 CPT4 Code: 8413244 Description: 11042 - WC PHYS SUBQ TISS 20 SQ CM ICD-10 Description Diagnosis E11.621 Type 2 diabetes mellitus with foot ulcer Modifier: Quantity: 1 Electronic Signature(s) Signed: 05/25/2016 8:01:59 AM By: Baltazar Najjar MD Entered By: Baltazar Najjar on 05/24/2016 09:46:23

## 2016-05-31 ENCOUNTER — Encounter: Payer: Medicare Other | Admitting: Internal Medicine

## 2016-05-31 DIAGNOSIS — E11621 Type 2 diabetes mellitus with foot ulcer: Secondary | ICD-10-CM | POA: Diagnosis not present

## 2016-05-31 NOTE — Progress Notes (Addendum)
MARVIE, CALENDER ([id number]) Visit Report for 05/31/2016 Arrival Information Details Patient Name: Gene Brown, Gene Brown Date of Service: 05/31/2016 8:00 AM Medical Record Number: [id number] Patient Account Number: [id number] Date of Birth/Sex: Nov 29, 1950 (65 y.o. Male) Treating RN: Clover Mealy, RN, BSN, [place] Sink Primary Care Physician: Barbette Reichmann Other Clinician: Referring Physician: Barbette Reichmann Treating Physician/Extender: Altamese [place] in Treatment: 1 Visit Information History Since Last Visit All ordered tests and consults were completed: No Patient Arrived: Ambulatory Added or deleted any medications: No Arrival Time: 08:05 Any new allergies or adverse reactions: No Accompanied By: self Had a fall or experienced change in No Transfer Assistance: None activities of daily living that may affect Patient Identification Verified: Yes risk of falls: Secondary Verification Process Yes Signs or symptoms of abuse/neglect since last No Completed: visito Patient Requires Transmission- No Hospitalized since last visit: No Based Precautions: Has Dressing in Place as Prescribed: Yes Patient Has Alerts: Yes Pain Present Now: No Patient Alerts: Patient on Blood Thinner Aspirin 325mg  Type II Diabetic 9/17 A1c 10 ABI non- compressible (L) Electronic Signature(s) Signed: 05/31/2016 9:05:26 AM By: Elpidio Eric BSN, RN Entered By: Elpidio Eric on 05/31/2016 08:05:32 Gene Brown ([id number]) -------------------------------------------------------------------------------- Encounter Discharge Information Details Patient Name: Gene Brown Date of Service: 05/31/2016 8:00 AM Medical Record Number: [id number] Patient Account Number: [id number] Date of Birth/Sex: 07/30/1951 (65 y.o. Male) Treating RN: Clover Mealy, RN, BSN, [place] Sink Primary Care Physician: Barbette Reichmann Other Clinician: Referring Physician: Barbette Reichmann Treating Physician/Extender: Altamese [place]  in Treatment: 1 Encounter Discharge Information Items Discharge Pain Level: 0 Discharge Condition: Stable Ambulatory Status: Ambulatory Discharge Destination: Home Transportation: Private Auto Accompanied By: self Schedule Follow-up Appointment: No Medication Reconciliation completed and provided to Patient/Care No Randa Riss: Provided on Clinical Summary of Care: 05/31/2016 Form Type Recipient Paper Patient PG Electronic Signature(s) Signed: 05/31/2016 9:05:26 AM By: Elpidio Eric BSN, RN Previous Signature: 05/31/2016 8:31:11 AM Version By: Gwenlyn Perking Entered By: Elpidio Eric on 05/31/2016 08:31:59 Gene Brown ([id number]) -------------------------------------------------------------------------------- Lower Extremity Assessment Details Patient Name: Gene Brown Date of Service: 05/31/2016 8:00 AM Medical Record Number: [id number] Patient Account Number: [id number] Date of Birth/Sex: 1951-07-18 (65 y.o. Male) Treating RN: Clover Mealy, RN, BSN, [place] Sink Primary Care Physician: Barbette Reichmann Other Clinician: Referring Physician: Barbette Reichmann Treating Physician/Extender: Maxwell Caul Weeks in Treatment: 1 Vascular Assessment Pulses: Posterior Tibial Dorsalis Pedis Palpable: [Left:Yes] Extremity colors, hair growth, and conditions: Extremity Color: [Left:Normal] Hair Growth on Extremity: [Left:No] Temperature of Extremity: [Left:Warm] Capillary Refill: [Left:< 3 seconds] Toe Nail Assessment Left: Right: Thick: No Discolored: No Deformed: No Improper Length and Hygiene: No Electronic Signature(s) Signed: 05/31/2016 9:05:26 AM By: Elpidio Eric BSN, RN Entered By: Elpidio Eric on 05/31/2016 08:06:27 Gene Brown ([id number]) -------------------------------------------------------------------------------- Multi Wound Chart Details Patient Name: Gene Brown Date of Service: 05/31/2016 8:00 AM Medical Record Number: [id number] Patient Account  Number: [id number] Date of Birth/Sex: September 02, 1950 (65 y.o. Male) Treating RN: Clover Mealy, RN, BSN, [place] Sink Primary Care Physician: Barbette Reichmann Other Clinician: Referring Physician: Barbette Reichmann Treating Physician/Extender: Maxwell Caul Weeks in Treatment: 1 Vital Signs Height(in): 68 Pulse(bpm): 64 Weight(lbs): 245 Blood Pressure 162/76 (mmHg): Body Mass Index(BMI): 37 Temperature(F): 97.9 Respiratory Rate 17 (breaths/min): Photos: [1:No Photos] [N/A:N/A] Wound Location: [1:Left Toe Great - Plantar] [N/A:N/A] Wounding Event: [1:Blister] [N/A:N/A] Primary Etiology: [1:Diabetic Wound/Ulcer of the Lower Extremity] [N/A:N/A] Secondary Etiology: [1:Pressure Ulcer] [N/A:N/A] Comorbid History: [1:Cataracts, Sickle Cell Disease, Sleep Apnea, Hypertension, Type II Diabetes, Gout, Osteoarthritis] [N/A:N/A] Date Acquired: [1:03/09/2015] [N/A:N/A] Weeks of  Treatment: [1:1] [N/A:N/A] Wound Status: [1:Open] [N/A:N/A] Measurements L x W x D 1x0.5x0.1 [N/A:N/A] (cm) Area (cm) : [1:0.393] [N/A:N/A] Volume (cm) : [1:0.039] [N/A:N/A] % Reduction in Area: [1:0.00%] [N/A:N/A] % Reduction in Volume: 0.00% [N/A:N/A] Classification: [1:Grade 2] [N/A:N/A] Exudate Amount: [1:None Present] [N/A:N/A] Wound Margin: [1:Flat and Intact] [N/A:N/A] Granulation Amount: [1:None Present (0%)] [N/A:N/A] Necrotic Amount: [1:Large (67-100%)] [N/A:N/A] Necrotic Tissue: [1:Eschar] [N/A:N/A] Exposed Structures: [1:Fascia: No Fat: No Tendon: No Muscle: No] [N/A:N/A] Joint: No Bone: No Limited to Skin Breakdown Epithelialization: Medium (34-66%) N/A N/A Periwound Skin Texture: Callus: Yes N/A N/A Edema: No Excoriation: No Induration: No Crepitus: No Fluctuance: No Friable: No Rash: No Scarring: No Periwound Skin Dry/Scaly: Yes N/A N/A Moisture: Maceration: No Moist: No Periwound Skin Color: Atrophie Blanche: No N/A N/A Cyanosis: No Ecchymosis: No Erythema: No Hemosiderin Staining:  No Mottled: No Pallor: No Rubor: No Temperature: No Abnormality N/A N/A Tenderness on No N/A N/A Palpation: Wound Preparation: Ulcer Cleansing: N/A N/A Rinsed/Irrigated with Saline Topical Anesthetic Applied: Other: lidociane 4% Treatment Notes Electronic Signature(s) Signed: 05/31/2016 9:05:26 AM By: Elpidio Eric BSN, RN Entered By: Elpidio Eric on 05/31/2016 08:13:37 Gene Brown ([id number]) -------------------------------------------------------------------------------- Multi-Disciplinary Care Plan Details Patient Name: Gene Brown Date of Service: 05/31/2016 8:00 AM Medical Record Number: [id number] Patient Account Number: [id number] Date of Birth/Sex: 20-Jul-1951 (65 y.o. Male) Treating RN: Clover Mealy, RN, BSN, [place] Sink Primary Care Physician: Barbette Reichmann Other Clinician: Referring Physician: Barbette Reichmann Treating Physician/Extender: Altamese [place] in Treatment: 1 Active Inactive Nutrition Nursing Diagnoses: Potential for alteratiion in Nutrition/Potential for imbalanced nutrition Goals: Patient/caregiver will maintain therapeutic glucose control Date Initiated: 05/24/2016 Goal Status: Active Interventions: Assess HgA1c results as ordered upon admission and as needed Treatment Activities: Patient referred to diabetes educator : 05/24/2016 Notes: Orientation to the Wound Care Program Nursing Diagnoses: Knowledge deficit related to the wound healing center program Goals: Patient/caregiver will verbalize understanding of the Wound Healing Center Program Date Initiated: 05/24/2016 Goal Status: Active Interventions: Provide education on orientation to the wound center Notes: Pressure Nursing Diagnoses: Potential for impaired tissue integrity related to pressure, friction, moisture, and shear ANCIL, DEWAN ([id number]) Goals: Patient will remain free from development of additional pressure ulcers Date Initiated: 05/24/2016 Goal Status:  Active Patient/caregiver will verbalize risk factors for pressure ulcer development Date Initiated: 05/24/2016 Goal Status: Active Interventions: Assess: immobility, friction, shearing, incontinence upon admission and as needed Provide education on pressure ulcers Notes: Wound/Skin Impairment Nursing Diagnoses: Impaired tissue integrity Goals: Patient/caregiver will verbalize understanding of skin care regimen Date Initiated: 05/24/2016 Goal Status: Active Ulcer/skin breakdown will heal within 14 weeks Date Initiated: 05/24/2016 Goal Status: Active Interventions: Assess patient/caregiver ability to obtain necessary supplies Notes: Electronic Signature(s) Signed: 05/31/2016 9:05:26 AM By: Elpidio Eric BSN, RN Entered By: Elpidio Eric on 05/31/2016 08:13:26 Gene Brown ([id number]) -------------------------------------------------------------------------------- Pain Assessment Details Patient Name: Gene Brown Date of Service: 05/31/2016 8:00 AM Medical Record Number: [id number] Patient Account Number: [id number] Date of Birth/Sex: 22-May-1951 (65 y.o. Male) Treating RN: Clover Mealy, RN, BSN,  Sink Primary Care Physician: Barbette Reichmann Other Clinician: Referring Physician: Barbette Reichmann Treating Physician/Extender: Maxwell Caul Weeks in Treatment: 1 Active Problems Location of Pain Severity and Description of Pain Patient Has Paino No Site Locations With Dressing Change: No Pain Management and Medication Current Pain Management: Electronic Signature(s) Signed: 05/31/2016 9:05:26 AM By: Elpidio Eric BSN, RN Entered By: Elpidio Eric on 05/31/2016 08:06:06 Gene Brown ([id number]) -------------------------------------------------------------------------------- Patient/Caregiver Education Details Patient Name: Gene Brown Date of Service:  05/31/2016 8:00 AM Medical Record Number: [id number] Patient Account Number: [id number] Date of Birth/Gender:  04/20/1951 [age] y.o. Male) Treating RN: Clover MealyAfful, RN, BSN, Rita Primary Care Physician: Barbette ReichmannHande, Vishwanath Other Clinician: Referring Physician: Barbette ReichmannHande, Vishwanath Treating Physician/Extender: Altamese CarolinaOBSON, MICHAEL G Weeks in Treatment: 1 Education Assessment Education Provided To: Patient Education Topics Provided Pressure: Methods: Explain/Verbal Responses: State content correctly Welcome To The [facility]: Methods: Explain/Verbal Responses: State content correctly Wound Debridement: Methods: Explain/Verbal Responses: State content correctly Wound/Skin Impairment: Methods: Explain/Verbal Responses: State content correctly Electronic Signature(s) Signed: 05/31/2016 9:05:26 AM By: Elpidio EricAfful, Rita BSN, RN Entered By: Elpidio EricAfful, Rita on 05/31/2016 08:32:20 Gene BangGRIBBONS, Gene V. ([id number]) -------------------------------------------------------------------------------- Wound Assessment Details Patient Name: Gene BangGRIBBONS, Gene V. Date of Service: 05/31/2016 8:00 AM Medical Record Number: [id number] Patient Account Number: [id number] Date of Birth/Sex: 03/29/1951 46(65 y.o. Male) Treating RN: Clover MealyAfful, RN, BSN, Rita Primary Care Physician: Barbette ReichmannHande, Vishwanath Other Clinician: Referring Physician: Barbette ReichmannHande, Vishwanath Treating Physician/Extender: Maxwell CaulOBSON, MICHAEL G Weeks in Treatment: 1 Wound Status Wound Number: 1 Primary Diabetic Wound/Ulcer of the Lower Etiology: Extremity Wound Location: Left Toe Great - Plantar Secondary Pressure Ulcer Wounding Event: Blister Etiology: Date Acquired: 03/09/2015 Wound Open Weeks Of Treatment: 1 Status: Clustered Wound: No Comorbid Cataracts, Sickle Cell Disease, Sleep History: Apnea, Hypertension, Type II Diabetes, Gout, Osteoarthritis Photos Photo Uploaded By: Elpidio EricAfful, Rita on 05/31/2016 16:08:28 Wound Measurements Length: (cm) 1 Width: (cm) 0.5 Depth: (cm) 0.1 Area: (cm) 0.393 Volume: (cm) 0.039 % Reduction in Area: 0% % Reduction in Volume:  0% Epithelialization: Medium (34-66%) Tunneling: No Undermining: No Wound Description Classification: Grade 2 Wound Margin: Flat and Intact Exudate Amount: None Present Wound Bed Granulation Amount: None Present (0%) Exposed Structure Necrotic Amount: Large (67-100%) Fascia Exposed: No Necrotic Quality: Eschar Fat Layer Exposed: No Tendon Exposed: No Muscle Exposed: No Joint Exposed: No Gene BangGRIBBONS, Gene V. ([id number]) Bone Exposed: No Limited to Skin Breakdown Periwound Skin Texture Texture Color No Abnormalities Noted: No No Abnormalities Noted: No Callus: Yes Atrophie Blanche: No Crepitus: No Cyanosis: No Excoriation: No Ecchymosis: No Fluctuance: No Erythema: No Friable: No Hemosiderin Staining: No Induration: No Mottled: No Localized Edema: No Pallor: No Rash: No Rubor: No Scarring: No Temperature / Pain Moisture Temperature: No Abnormality No Abnormalities Noted: No Dry / Scaly: Yes Maceration: No Moist: No Wound Preparation Ulcer Cleansing: Rinsed/Irrigated with Saline Topical Anesthetic Applied: Other: lidociane 4%, Treatment Notes Wound #1 (Left, Plantar Toe Great) 1. Cleansed with: Clean wound with Normal Saline 4. Dressing Applied: Aquacel Ag Notes Covereltte bandaid Electronic Signature(s) Signed: 05/31/2016 9:05:26 AM By: Elpidio EricAfful, Rita BSN, RN Entered By: Elpidio EricAfful, Rita on 05/31/2016 08:13:21 Gene BangGRIBBONS, Gene V. ([id number]) -------------------------------------------------------------------------------- Vitals Details Patient Name: Gene BangGRIBBONS, Gene V. Date of Service: 05/31/2016 8:00 AM Medical Record Number: [id number] Patient Account Number: [id number] Date of Birth/Sex: 07/15/1951 [age] y.o. Male) Treating RN: Clover MealyAfful, RN, BSN, Rita Primary Care Physician: Barbette ReichmannHande, Vishwanath Other Clinician: Referring Physician: Barbette ReichmannHande, Vishwanath Treating Physician/Extender: Altamese CarolinaOBSON, MICHAEL G Weeks in Treatment: 1 Vital Signs Time Taken: 08:06 Temperature (F):  97.9 Height (in): 68 Pulse (bpm): 64 Weight (lbs): 245 Respiratory Rate (breaths/min): 17 Body Mass Index (BMI): 37.2 Blood Pressure (mmHg): 162/76 Reference Range: 80 - 120 mg / dl Electronic Signature(s) Signed: 05/31/2016 9:05:26 AM By: Elpidio EricAfful, Rita BSN, RN Entered By: Elpidio EricAfful, Rita on 05/31/2016 08:09:53

## 2016-06-01 NOTE — Progress Notes (Signed)
SUHEYB, RAUCCI ([id number]) Visit Report for 05/31/2016 Chief Complaint Document Details Patient Name: Gene Brown, Gene Brown Date of Service: 05/31/2016 8:00 AM Medical Record Number: [id number] Patient Account Number: [id number] Date of Birth/Sex: 19-Feb-1951 (65 y.o. Male) Treating RN: Clover Mealy, RN, BSN, [place] Sink Primary Care Physician: Barbette Reichmann Other Clinician: Referring Physician: Barbette Reichmann Treating Physician/Extender: Maxwell Caul Weeks in Treatment: 1 Information Obtained from: Patient Chief Complaint 05/24/16 patient is here for review of the wound on his plantar left great toe that is been present off and on for a year Electronic Signature(s) Signed: 05/31/2016 5:12:34 PM By: Baltazar Najjar MD Entered By: Baltazar Najjar on 05/31/2016 09:07:56 Gene Brown ([id number]) -------------------------------------------------------------------------------- Debridement Details Patient Name: Gene Brown Date of Service: 05/31/2016 8:00 AM Medical Record Number: [id number] Patient Account Number: [id number] Date of Birth/Sex: [birth year] (65 y.o. Male) Treating RN: Clover Mealy, RN, BSN, Rita Primary Care Physician: Barbette Reichmann Other Clinician: Referring Physician: Barbette Reichmann Treating Physician/Extender: Altamese [place] in Treatment: 1 Debridement Performed for Wound #1 Left,Plantar Toe Great Assessment: Performed By: Physician Maxwell Caul, MD Debridement: Debridement Pre-procedure Yes - 08:18 Verification/Time Out Taken: Start Time: 08:18 Pain Control: Lidocaine 4% Topical Solution Level: Skin/Subcutaneous Tissue Total Area Debrided (L x 1 (cm) x 0.5 (cm) = 0.5 (cm) W): Tissue and other Non-Viable, Callus, Subcutaneous material debrided: Instrument: Blade, Curette, Forceps Bleeding: Minimum Hemostasis Achieved: Pressure End Time: 08:23 Procedural Pain: 0 Post Procedural Pain: 0 Response to Treatment: Procedure was tolerated  well Post Debridement Measurements of Total Wound Length: (cm) 1 Width: (cm) 1 Depth: (cm) 0.1 Volume: (cm) 0.079 Character of Wound/Ulcer Post Stable Debridement: Severity of Tissue Post Debridement: Fat layer exposed Post Procedure Diagnosis Same as Pre-procedure Electronic Signature(s) Signed: 05/31/2016 5:12:34 PM By: Baltazar Najjar MD Signed: 05/31/2016 5:33:24 PM By: Elpidio Eric BSN, RN Previous Signature: 05/31/2016 9:05:26 AM Version By: Elpidio Eric BSN, RN Entered By: Baltazar Najjar on 05/31/2016 09:07:45 Gene Brown ([name] ([id number]) -------------------------------------------------------------------------------- HPI Details Patient Name: Gene Brown Date of Service: 05/31/2016 8:00 AM Medical Record Number: [id number] Patient Account Number: [id number] Date of Birth/Sex: [birth year] (65 y.o. Male) Treating RN: Clover Mealy, RN, BSN, Rita Primary Care Physician: Barbette Reichmann Other Clinician: Referring Physician: Barbette Reichmann Treating Physician/Extender: Maxwell Caul Weeks in Treatment: 1 History of Present Illness HPI Description: 05/24/16; this is a 65 year old type II diabetic who probably has some degree of polyneuropathy. He tells me that he has had an area on the plantar aspect of his left great toe that is been followed by podiatry for most the last year. He has a lot of callus, the podiatrist which shave this down put a dressing on and he would follow pad monthly or sometimes longer intervals. He has not noted any drainage or pain. He has not had a recent x-ray. No serious attempt at offloading this with modified footwear.. Last hemoglobin A1c that he knows about was over 10 a month ago he is working hard on this with his primary physician. More recently he has been soaking this with peroxide ABIs are noncompressible and the left leg. He was told by his podiatrist he has a bone spur underneath the wound but did not  wish any surgical procedures. He has a caregiver at home for a very disabled wife, the nature of her illness is not certain 05/31/16 patient comes in much the same as last week amounts of callus nonviable subcutaneous tissue which required debridement. His x-ray suggested cortical bone irregularity.  He will need an MRI. He tells me he has old screws/hardware left knee however he thinks he has had a subsequent MRI after this Electronic Signature(s) Signed: 05/31/2016 5:12:34 PM By: Baltazar Najjarobson, Michael MD Entered By: Baltazar Najjarobson, Michael on 05/31/2016 09:08:44 Gene BangGRIBBONS, [name] ([id number]) -------------------------------------------------------------------------------- Physical Exam Details Patient Name: Gene BangGRIBBONS, [name] Date of Service: 05/31/2016 8:00 AM Medical Record Number: [id number] Patient Account Number: [id number] Date of Birth/Sex: 05/21/1951 49(65 y.o. Male) Treating RN: Clover MealyAfful, RN, BSN, [place] Primary Care Physician: Barbette ReichmannHande, Vishwanath Other Clinician: Referring Physician: Barbette ReichmannHande, Vishwanath Treating Physician/Extender: Maxwell CaulOBSON, MICHAEL G Weeks in Treatment: 1 Constitutional Patient is hypertensive.. Pulse regular and within target range for patient.Marland Kitchen. Respirations regular, non-labored and within target range.. Temperature is normal and within the target range for the patient.. Patient's appearance is neat and clean. Appears in no acute distress. Well nourished and well developed.. Cardiovascular Pedal pulses palpable and strong bilaterally.. Notes Exam; the area on the plantar aspect of the left great toe roughly at the level of the interphalangeal joint. Debrided with the pickup and scalpel of copious amounts of callus subcutaneous tissue. Post debridement the area actually cleans up quite nicely. No evidence of surrounding infection or ischemia Electronic Signature(s) Signed: 05/31/2016 5:12:34 PM By: Baltazar Najjarobson, Michael MD Entered By: Baltazar Najjarobson, Michael on 05/31/2016 09:09:42 Gene BangGRIBBONS, [name]  ([id number]) -------------------------------------------------------------------------------- Physician Orders Details Patient Name: Gene BangGRIBBONS, Jermon V. Date of Service: 05/31/2016 8:00 AM Medical Record Number: [id number] Patient Account Number: [id number] Date of Birth/Sex: 10/03/1950 [age] y.o. Male) Treating RN: Clover MealyAfful, RN, BSN, [place]inkita Primary Care Physician: Barbette ReichmannHande, Vishwanath Other Clinician: Referring Physician: Barbette ReichmannHande, Vishwanath Treating Physician/Extender: Altamese CarolinaOBSON, MICHAEL G Weeks in Treatment: 1 Verbal / Phone Orders: Yes Clinician: Afful, RN, BSN, Rita Read Back and Verified: Yes Diagnosis Coding Wound Cleansing Wound #1 Left,Plantar Toe Great o Clean wound with wound cleanser. Anesthetic Wound #1 Left,Plantar Toe Great o Topical Lidocaine 4% cream applied to wound bed prior to debridement Primary Wound Dressing Wound #1 Left,Plantar Toe Great o Aquacel Ag Secondary Dressing Wound #1 Left,Plantar Toe Great o Non-adherent pad - self adherent bandage Dressing Change Frequency Wound #1 Left,Plantar Toe Great o Change Dressing Monday, Wednesday, Friday Follow-up Appointments Wound #1 Left,Plantar Toe Great o Return Appointment in 1 week. Edema Control Wound #1 Left,Plantar Toe Great o Elevate legs to the level of the heart and pump ankles as often as possible Off-Loading Wound #1 Left,Plantar Toe Great o Open toe surgical shoe with peg assist. Additional Orders / Instructions [address]ound #[address]Plantar Toe Merry ProudGreat Bradeen, Leeandre V. ([id number]) o Increase protein intake. Radiology o Magnetic Resonance Imaging (MRI) - with and without contrast of left foot/left great toe Electronic Signature(s) Signed: 05/31/2016 9:05:26 AM By: Elpidio EricAfful, Rita BSN, RN Signed: 05/31/2016 5:12:34 PM By: Baltazar Najjarobson, Michael MD Entered By: Elpidio EricAfful, Rita on 05/31/2016 08:30:40 Gene BangGRIBBONS, Voyd V.  ([id number]) -------------------------------------------------------------------------------- Problem List Details Patient Name: Gene BangGRIBBONS, Marvion V. Date of Service: 05/31/2016 8:00 AM Medical Record Number: [id number] Patient Account Number: [id number] Date of Birth/Sex: 05/12/1951 106(65 y.o. 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 Male) Treating RN: Clover Mealy, RN, BSN, [place] Primary Care Physician: Barbette Reichmann Other Clinician: Referring Physician: Barbette Reichmann Treating Physician/Extender: Altamese [place] in Treatment: 1 Subjective Chief Complaint Information obtained from Patient 05/24/16 patient is here for review of the wound on his plantar left great toe that is been present off and on for a year History of Present Illness (HPI) 05/24/16; this is a 65 year old type II diabetic who probably has some degree of polyneuropathy. He tells me that he has had an area on the plantar aspect of his left great toe that is been followed by  podiatry for most the last year. He has a lot of callus, the podiatrist which shave this down put a dressing on and he would follow pad monthly or sometimes longer intervals. He has not noted any drainage or pain. He has not had a recent x-ray. No serious attempt at offloading this with modified footwear.. Last hemoglobin A1c that he knows about was over 10 a month ago he is working hard on this with his primary physician. More recently he has been soaking this with peroxide ABIs are noncompressible and the left leg. He was told by his podiatrist he has a bone spur underneath the wound but did not wish any surgical procedures. He has a caregiver at home for a very disabled wife, the nature of her illness is not certain 05/31/16 patient comes in much the same as last week amounts of callus nonviable subcutaneous tissue which required debridement. His x-ray suggested cortical bone irregularity. He will need an MRI. He tells me he has old screws/hardware left knee however he thinks he has had a subsequent MRI after this Objective Constitutional Patient is hypertensive.. Pulse regular and within target range for patient.Marland Kitchen Respirations regular, non-labored and within target range.. Temperature is normal and within the target range for the patient.. Patient's appearance is neat and clean. Appears in no acute distress. Well nourished and well developed.. Vitals Time Taken: 8:06 AM, Height: 68 in, Weight: 245 lbs, BMI: 37.2, Temperature: 97.9 F, Pulse: 64 bpm, Respiratory Rate: 17 breaths/min, Blood Pressure: 162/76 mmHg. Cardiovascular Pedal pulses palpable and strong bilaterally.Marland Kitchen MATTHER, LABELL ([id number]) General Notes: Exam; the area on the plantar aspect of the left great toe roughly at the level of the interphalangeal joint. Debrided with the pickup and scalpel of copious amounts of callus subcutaneous tissue. Post debridement the area actually cleans up quite nicely. No evidence of  surrounding infection or ischemia Integumentary (Hair, Skin) Wound #1 status is Open. Original cause of wound was Blister. The wound is located on the [employer]. The wound measures 1cm length x 0.5cm width x 0.1cm depth; 0.393cm^2 area and 0.039cm^3 volume. The wound is limited to skin breakdown. There is no tunneling or undermining noted. There is a none present amount of drainage noted. The wound margin is flat and intact. There is no granulation within the wound bed. There is a large (67-100%) amount of necrotic tissue within the wound bed including Eschar. The periwound skin appearance exhibited: Callus, Dry/Scaly. The periwound skin appearance did not exhibit: Crepitus, Excoriation, Fluctuance, Friable, Induration, Localized Edema, Rash, Scarring, Maceration, Moist, Atrophie Blanche, Cyanosis, Ecchymosis, Hemosiderin Staining, Mottled, Pallor, Rubor, Erythema. Periwound temperature was noted as No Abnormality. 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 There was a Skin/Subcutaneous Tissue Debridement ([id number]) debridement with total area of 0.5 sq cm performed by Maxwell Caul, MD. with the following instrument(s): Blade, Curette, and Forceps to remove Non-Viable tissue/material including Callus and Subcutaneous after achieving pain control using Lidocaine 4% Topical Solution. A time out was conducted at 08:18, prior to the start of the procedure. A Minimum amount of bleeding was controlled with Pressure. The procedure was tolerated well with a pain level of 0 throughout and a pain level of 0 following the procedure. Post Debridement Measurements: 1cm length  x 1cm width x 0.1cm depth; 0.079cm^3 volume. Character of Wound/Ulcer Post Debridement is stable. Severity of Tissue Post Debridement is: Fat layer exposed. Post procedure Diagnosis Wound #1: Same as Pre-Procedure KASEEM, VASTINE ([id number]) Plan Wound Cleansing: Wound #1 Left,Plantar Toe Great: Clean wound with wound cleanser. Anesthetic: Wound #1 Left,Plantar Toe Great: Topical Lidocaine 4% cream applied to wound bed prior to debridement Primary Wound Dressing: Wound #1 Left,Plantar Toe Great: Aquacel Ag Secondary Dressing: Wound #1 Left,Plantar Toe Great: Non-adherent pad - self adherent bandage Dressing Change Frequency: Wound #1 Left,Plantar Toe Great: Change Dressing Monday, Wednesday, Friday Follow-up Appointments: Wound #1 Left,Plantar Toe Great: Return Appointment in 1 week. Edema Control: Wound #1 Left,Plantar Toe Great: Elevate legs to the level of the heart and pump ankles as often as possible Off-Loading: Wound #1 Left,Plantar Toe Great: Open toe surgical shoe with peg assist. Additional Orders / Instructions: Wound #1 Left,Plantar Toe Great: Increase protein intake. Radiology ordered were: Magnetic Resonance Imaging (MRI) - with and without contrast of left foot/left great toe changed him to silver alginate foam peg assist shoe. for various reasons options for offloadig further are limited he will need an MRI, presumable he should be able to tolerate this if he has had one. will check JACQUE, GARRELS ([id number]) Electronic Signature(s) Signed: 05/31/2016 5:12:34 PM By: Baltazar Najjar MD Entered By: Baltazar Najjar on 05/31/2016 09:11:26 Gene Brown ([id number]) -------------------------------------------------------------------------------- SuperBill Details Patient Name: Gene Brown Date of Service: 05/31/2016 Medical Record Number: [id number] Patient Account Number: [id number] Date of Birth/Sex: Aug 28, 1950 (65 y.o. Male) Treating RN:  Clover Mealy, RN, BSN, Rita Primary Care Physician: Barbette Reichmann Other Clinician: Referring Physician: Barbette Reichmann Treating Physician/Extender: Maxwell Caul Weeks in Treatment: 1 Diagnosis Coding ICD-10 Codes Code Description E11.621 Type 2 diabetes mellitus with foot ulcer L97.523 Non-pressure chronic ulcer of other part of left foot with necrosis of muscle E11.42 Type 2 diabetes mellitus with diabetic polyneuropathy Facility Procedures CPT4 Code Description: [id number] 11042 - DEB SUBQ TISSUE 20 SQ CM/< ICD-10 Description Diagnosis E11.621 Type 2 diabetes mellitus with foot ulcer L97.523 Non-pressure chronic ulcer of other part of left foot Modifier: with necrosi Quantity: 1 s of muscle Physician Procedures CPT4 Code Description: 2440102 11042 - WC PHYS SUBQ TISS 20 SQ CM ICD-10 Description Diagnosis E11.621 Type 2 diabetes mellitus with foot ulcer L97.523 Non-pressure chronic ulcer of other part of left foot Modifier: with necrosis Quantity: 1 of muscle Electronic Signature(s) Signed: 05/31/2016 5:12:34 PM By: Baltazar Najjar MD Entered By: Baltazar Najjar on 05/31/2016 09:11:52

## 2016-06-06 ENCOUNTER — Encounter: Payer: Self-pay | Admitting: Urology

## 2016-06-06 ENCOUNTER — Ambulatory Visit (INDEPENDENT_AMBULATORY_CARE_PROVIDER_SITE_OTHER): Payer: Medicare Other | Admitting: Urology

## 2016-06-06 VITALS — BP 158/81 | HR 89 | Ht 68.0 in | Wt 247.0 lb

## 2016-06-06 DIAGNOSIS — R972 Elevated prostate specific antigen [PSA]: Secondary | ICD-10-CM | POA: Diagnosis not present

## 2016-06-06 NOTE — Progress Notes (Signed)
The patient p elevated PSA. Over the past 6 months the patient's voiding symptoms have bestable. Initially he was getting up to 4 times a night He had incomplete bladder emptying and a weak stream. He has been on finasteride and Flomax over the past 9 months and his voiding symptoms have improved significantly.  He is now getting up at most one time at night him a has a stream, and feels that he empties his bladder more completely. He he has no dysuria or gross hematuria.  PSA history: 3.4(6.8) on 04/12/16 2.8 (5.6) on 12/29/15 5.5 on 06/25/15 (negative prostate biopsy)  The patient's erections issues continue, however he has not.picked up his prescription due to his wife's health.  Vitals:   06/06/16 0846  BP: (!) 158/81  Pulse: 89   NAD  Imaging: No imaging is available today UA: Not performed today  Impression/plan: #1: Elevated PSA: The patient's PSA is elevated when doubled to account for his taking finasteride. He did have a negative prostate biopsy approximately one year ago. Our plan is to continue to trend at a six-month interval, and if it remains elevated or rising at his follow-up visit we would likely obtain an MRI prior to repeating his prostate biopsy. #2: Obstructing L UTS: The patient's voiding symptoms are significantly improved on finasteride and Flomax. We'll continue this regimen. #3: Erectile dysfunction: At this point, the patient is not particularly interested in his erectile dysfunction due to his wife's health. She is planning several orthopedic surgeries in the coming months and hopefully her condition will improve. We'll continue to discuss this at follow-up visits.  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 We did discuss this in some detail.

## 2016-06-07 ENCOUNTER — Other Ambulatory Visit: Payer: Self-pay | Admitting: Internal Medicine

## 2016-06-07 ENCOUNTER — Encounter: Payer: Medicare Other | Admitting: Physician Assistant

## 2016-06-07 DIAGNOSIS — E11621 Type 2 diabetes mellitus with foot ulcer: Secondary | ICD-10-CM | POA: Diagnosis not present

## 2016-06-07 DIAGNOSIS — M86672 Other chronic osteomyelitis, left ankle and foot: Secondary | ICD-10-CM

## 2016-06-08 NOTE — Progress Notes (Signed)
RYIN, AMBROSIUS ([id number]) Visit Report for 06/07/2016 Arrival Information Details Patient Name: Gene Brown, Gene Brown Date of Service: 06/07/2016 8:15 AM Medical Record Number: [id number] Patient Account Number: [id number] Date of Birth/Sex: 01-17-1951 (65 y.o. Male) Treating RN: Clover Mealy, RN, BSN, [place] Sink Primary Care Physician: Barbette Reichmann Other Clinician: Referring Physician: Barbette Reichmann Treating Physician/Extender: Altamese [place] in Treatment: 2 Visit Information History Since Last Visit All ordered tests and consults were completed: No Patient Arrived: Ambulatory Added or deleted any medications: No Arrival Time: 08:18 Any new allergies or adverse reactions: No Accompanied By: self Had a fall or experienced change in No Transfer Assistance: None activities of daily living that may affect Patient Identification Verified: Yes risk of falls: Secondary Verification Process Yes Signs or symptoms of abuse/neglect since last No Completed: visito Patient Requires Transmission- No Hospitalized since last visit: No Based Precautions: Has Dressing in Place as Prescribed: Yes Patient Has Alerts: Yes Has Footwear/Offloading in Place as Yes Patient Alerts: Patient on Blood Prescribed: Thinner Left: Wedge Aspirin 325mg  Shoe Type II Diabetic 9/17 A1c 10 Pain Present Now: No ABI non- compressible (L) Electronic Signature(s) Signed: 06/07/2016 5:16:16 PM By: Elpidio Eric BSN, RN Entered By: Elpidio Eric on 06/07/2016 08:18:33 Gene Brown ([id number]) -------------------------------------------------------------------------------- Clinic Level of Care Assessment Details Patient Name: Gene Brown Date of Service: 06/07/2016 8:15 AM Medical Record Number: [id number] Patient Account Number: [id number] Date of Birth/Sex: 1950-12-06 (65 y.o. Male) Treating RN: Clover Mealy, RN, BSN, Rita Primary Care Physician: Barbette Reichmann Other Clinician: Referring Physician:  Barbette Reichmann Treating Physician/Extender: Linwood Dibbles, HOYT Weeks in Treatment: 2 Clinic Level of Care Assessment Items TOOL 4 Quantity Score []  - Use when only an EandM is performed on FOLLOW-UP visit 0 ASSESSMENTS - Nursing Assessment / Reassessment X - Reassessment of Co-morbidities (includes updates in patient status) 1 10 X - Reassessment of Adherence to Treatment Plan 1 5 ASSESSMENTS - Wound and Skin Assessment / Reassessment X - Simple Wound Assessment / Reassessment - one wound 1 5 []  - Complex Wound Assessment / Reassessment - multiple wounds 0 []  - Dermatologic / Skin Assessment (not related to wound area) 0 ASSESSMENTS - Focused Assessment []  - Circumferential Edema Measurements - multi extremities 0 []  - Nutritional Assessment / Counseling / Intervention 0 X - Lower Extremity Assessment (monofilament, tuning fork, pulses) 1 5 []  - Peripheral Arterial Disease Assessment (using hand held doppler) 0 ASSESSMENTS - Ostomy and/or Continence Assessment and Care []  - Incontinence Assessment and Management 0 []  - Ostomy Care Assessment and Management (repouching, etc.) 0 PROCESS - Coordination of Care []  - Simple Patient / Family Education for ongoing care 0 []  - Complex (extensive) Patient / Family Education for ongoing care 0 []  - Haematologist obtains Chiropractor, Records, Test Results / Process Orders 0 []  - Staff telephones HHA, Nursing Homes / Clarify orders / etc 0 []  - Routine Transfer to another Facility (non-emergent condition) 0 Gene Brown, Gene Brown ([id number]) []  - Routine Hospital Admission (non-emergent condition) 0 []  - New Admissions / Manufacturing engineer / Ordering NPWT, Apligraf, etc. 0 []  - Emergency Hospital Admission (emergent condition) 0 []  - Simple Discharge Coordination 0 []  - Complex (extensive) Discharge Coordination 0 PROCESS - Special Needs []  - Pediatric / Minor Patient Management 0 []  - Isolation Patient Management 0 []  - Hearing / Language / Visual  special needs 0 []  - Assessment of Community assistance (transportation, D/C planning, etc.) 0 []  - Additional assistance / Altered mentation 0 []  - Support Surface(s) Assessment (bed,  cushion, seat, etc.) 0 INTERVENTIONS - Wound Cleansing / Measurement X - Simple Wound Cleansing - one wound 1 5 []  - Complex Wound Cleansing - multiple wounds 0 X - Wound Imaging (photographs - any number of wounds) 1 5 []  - Wound Tracing (instead of photographs) 0 X - Simple Wound Measurement - one wound 1 5 []  - Complex Wound Measurement - multiple wounds 0 INTERVENTIONS - Wound Dressings X - Small Wound Dressing one or multiple wounds 1 10 []  - Medium Wound Dressing one or multiple wounds 0 []  - Large Wound Dressing one or multiple wounds 0 []  - Application of Medications - topical 0 []  - Application of Medications - injection 0 INTERVENTIONS - Miscellaneous []  - External ear exam 0 Gene Brown, Gene Brown ([id number]) []  - Specimen Collection (cultures, biopsies, blood, body fluids, etc.) 0 []  - Specimen(s) / Culture(s) sent or taken to Lab for analysis 0 []  - Patient Transfer (multiple staff / Michiel Sites Lift / Similar devices) 0 []  - Simple Staple / Suture removal (25 or less) 0 []  - Complex Staple / Suture removal (26 or more) 0 []  - Hypo / Hyperglycemic Management (close monitor of Blood Glucose) 0 []  - Ankle / Brachial Index (ABI) - do not check if billed separately 0 X - Vital Signs 1 5 Has the patient been seen at the hospital within the last three years: Yes Total Score: 55 Level Of Care: New/Established - Level 2 Electronic Signature(s) Signed: 06/07/2016 5:16:16 PM By: Elpidio Eric BSN, RN Entered By: Elpidio Eric on 06/07/2016 17:09:39 Gene Brown ([id number]) -------------------------------------------------------------------------------- Encounter Discharge Information Details Patient Name: Gene Brown Date of Service: 06/07/2016 8:15 AM Medical Record Number: [id number] Patient  Account Number: [id number] Date of Birth/Sex: 07/06/1951 (65 y.o. Male) Treating RN: Clover Mealy, RN, BSN, [place] Sink Primary Care Physician: Barbette Reichmann Other Clinician: Referring Physician: Barbette Reichmann Treating Physician/Extender: Linwood Dibbles, HOYT Weeks in Treatment: 2 Encounter Discharge Information Items Discharge Pain Level: 0 Discharge Condition: Stable Ambulatory Status: Ambulatory Discharge Destination: Home Transportation: Private Auto Accompanied By: self Schedule Follow-up Appointment: No Medication Reconciliation completed and provided to Patient/Care No Jayel Scaduto: Provided on Clinical Summary of Care: 06/07/2016 Form Type Recipient Paper Patient PG Electronic Signature(s) Signed: 06/07/2016 8:48:01 AM By: Gwenlyn Perking Entered By: Gwenlyn Perking on 06/07/2016 08:48:01 Gene Brown ([id number]) -------------------------------------------------------------------------------- Lower Extremity Assessment Details Patient Name: Gene Brown Date of Service: 06/07/2016 8:15 AM Medical Record Number: [id number] Patient Account Number: [id number] Date of Birth/Sex: 1951-06-16 (65 y.o. Male) Treating RN: Clover Mealy, RN, BSN, [place] Sink Primary Care Physician: Barbette Reichmann Other Clinician: Referring Physician: Barbette Reichmann Treating Physician/Extender: Maxwell Caul Weeks in Treatment: 2 Vascular Assessment Pulses: Posterior Tibial Dorsalis Pedis Palpable: [Left:Yes] Extremity colors, hair growth, and conditions: Extremity Color: [Left:Normal] Hair Growth on Extremity: [Left:No] Temperature of Extremity: [Left:Warm] Capillary Refill: [Left:< 3 seconds] Toe Nail Assessment Left: Right: Thick: No Discolored: No Deformed: No Improper Length and Hygiene: No Electronic Signature(s) Signed: 06/07/2016 5:16:16 PM By: Elpidio Eric BSN, RN Entered By: Elpidio Eric on 06/07/2016 08:19:03 Gene Brown  ([id number]) -------------------------------------------------------------------------------- Multi Wound Chart Details Patient Name: Gene Brown Date of Service: 06/07/2016 8:15 AM Medical Record Number: [id number] Patient Account Number: [id number] Date of Birth/Sex: 1951-04-05 (65 y.o. Male) Treating RN: Clover Mealy, RN, BSN, [place] Sink Primary Care Physician: Barbette Reichmann Other Clinician: Referring Physician: Barbette Reichmann Treating Physician/Extender: Maxwell Caul Weeks in Treatment: 2 Vital Signs Height(in): 68 Pulse(bpm): 72 Weight(lbs): 245 Blood Pressure 157/84 (mmHg): Body Mass Index(BMI): 37 Temperature(F): 97.5  Respiratory Rate 17 (breaths/min): Photos: [1:No Photos] [N/A:N/A] Wound Location: [1:Left Toe Great - Plantar] [N/A:N/A] Wounding Event: [1:Blister] [N/A:N/A] Primary Etiology: [1:Diabetic Wound/Ulcer of the Lower Extremity] [N/A:N/A] Secondary Etiology: [1:Pressure Ulcer] [N/A:N/A] Comorbid History: [1:Cataracts, Sickle Cell Disease, Sleep Apnea, Hypertension, Type II Diabetes, Gout, Osteoarthritis] [N/A:N/A] Date Acquired: [1:03/09/2015] [N/A:N/A] Weeks of Treatment: [1:2] [N/A:N/A] Wound Status: [1:Open] [N/A:N/A] Measurements L x W x D 0.5x0.5x0.1 [N/A:N/A] (cm) Area (cm) : [1:0.196] [N/A:N/A] Volume (cm) : [1:0.02] [N/A:N/A] % Reduction in Area: [1:50.10%] [N/A:N/A] % Reduction in Volume: 48.70% [N/A:N/A] Classification: [1:Grade 2] [N/A:N/A] Exudate Amount: [1:Medium] [N/A:N/A] Exudate Type: [1:Serosanguineous] [N/A:N/A] Exudate Color: [1:red, brown] [N/A:N/A] Wound Margin: [1:Flat and Intact] [N/A:N/A] Granulation Amount: [1:Large (67-100%)] [N/A:N/A] Necrotic Amount: [1:None Present (0%)] [N/A:N/A] Exposed Structures: [1:Fascia: No Fat: No Tendon: No] [N/A:N/A] Muscle: No Joint: No Bone: No Limited to Skin Breakdown Epithelialization: Medium (34-66%) N/A N/A Periwound Skin Texture: Edema: No N/A N/A Excoriation: No Induration:  No Callus: No Crepitus: No Fluctuance: No Friable: No Rash: No Scarring: No Periwound Skin Moist: Yes N/A N/A Moisture: Maceration: No Dry/Scaly: No Periwound Skin Color: Atrophie Blanche: No N/A N/A Cyanosis: No Ecchymosis: No Erythema: No Hemosiderin Staining: No Mottled: No Pallor: No Rubor: No Temperature: No Abnormality N/A N/A Tenderness on No N/A N/A Palpation: Wound Preparation: Ulcer Cleansing: N/A N/A Rinsed/Irrigated with Saline Topical Anesthetic Applied: Other: lidociane 4% Treatment Notes Electronic Signature(s) Signed: 06/07/2016 5:16:16 PM By: Elpidio Eric BSN, RN Entered By: Elpidio Eric on 06/07/2016 08:25:27 Gene Brown ([id number]) -------------------------------------------------------------------------------- Multi-Disciplinary Care Plan Details Patient Name: Gene Brown Date of Service: 06/07/2016 8:15 AM Medical Record Number: [id number] Patient Account Number: [id number] Date of Birth/Sex: 1951-03-23 (65 y.o. Male) Treating RN: Clover Mealy, RN, BSN, [place] Sink Primary Care Physician: Barbette Reichmann Other Clinician: Referring Physician: Barbette Reichmann Treating Physician/Extender: Altamese [place] in Treatment: 2 Active Inactive Nutrition Nursing Diagnoses: Potential for alteratiion in Nutrition/Potential for imbalanced nutrition Goals: Patient/caregiver will maintain therapeutic glucose control Date Initiated: 05/24/2016 Goal Status: Active Interventions: Assess HgA1c results as ordered upon admission and as needed Treatment Activities: Patient referred to diabetes educator : 05/24/2016 Notes: Orientation to the Wound Care Program Nursing Diagnoses: Knowledge deficit related to the wound healing center program Goals: Patient/caregiver will verbalize understanding of the Wound Healing Center Program Date Initiated: 05/24/2016 Goal Status: Active Interventions: Provide education on orientation to the wound  center Notes: Pressure Nursing Diagnoses: Potential for impaired tissue integrity related to pressure, friction, moisture, and shear Gene Brown, Gene Brown ([id number]) Goals: Patient will remain free from development of additional pressure ulcers Date Initiated: 05/24/2016 Goal Status: Active Patient/caregiver will verbalize risk factors for pressure ulcer development Date Initiated: 05/24/2016 Goal Status: Active Interventions: Assess: immobility, friction, shearing, incontinence upon admission and as needed Provide education on pressure ulcers Notes: Wound/Skin Impairment Nursing Diagnoses: Impaired tissue integrity Goals: Patient/caregiver will verbalize understanding of skin care regimen Date Initiated: 05/24/2016 Goal Status: Active Ulcer/skin breakdown will heal within 14 weeks Date Initiated: 05/24/2016 Goal Status: Active Interventions: Assess patient/caregiver ability to obtain necessary supplies Notes: Electronic Signature(s) Signed: 06/07/2016 5:16:16 PM By: Elpidio Eric BSN, RN Entered By: Elpidio Eric on 06/07/2016 08:25:20 Gene Brown ([id number]) -------------------------------------------------------------------------------- Pain Assessment Details Patient Name: Gene Brown Date of Service: 06/07/2016 8:15 AM Medical Record Number: [id number] Patient Account Number: [id number] Date of Birth/Sex: 10-Apr-1951 (65 y.o. Male) Treating RN: Clover Mealy, RN, BSN,  Sink Primary Care Physician: Barbette Reichmann Other Clinician: Referring Physician: Barbette Reichmann Treating Physician/Extender: Maxwell Caul Weeks in Treatment: 2 Active Problems  Location of Pain Severity and Description of Pain Patient Has Paino No Site Locations With Dressing Change: No Pain Management and Medication Current Pain Management: Electronic Signature(s) Signed: 06/07/2016 5:16:16 PM By: Elpidio EricAfful, Rita BSN, RN Entered By: Elpidio EricAfful, Rita on 06/07/2016 08:18:41 Gene Brown, Gene V.  ([id number]) -------------------------------------------------------------------------------- Patient/Caregiver Education Details Patient Name: Gene Brown, Gene V. Date of Service: 06/07/2016 8:15 AM Medical Record Number: [id number] Patient Account Number: [id number] Date of Birth/Gender: 01/22/1951 55(65 y.o. Male) Treating RN: Clover MealyAfful, RN, BSN, Rita Primary Care Physician: Barbette ReichmannHande, Vishwanath Other Clinician: Referring Physician: Barbette ReichmannHande, Vishwanath Treating Physician/Extender: Skeet SimmerSTONE III, HOYT Weeks in Treatment: 2 Education Assessment Education Provided To: Patient Education Topics Provided Pressure: Methods: Explain/Verbal Responses: State content correctly Welcome To The [facility]: Methods: Explain/Verbal Responses: State content correctly Wound/Skin Impairment: Methods: Explain/Verbal Responses: State content correctly Electronic Signature(s) Signed: 06/07/2016 5:16:16 PM By: Elpidio EricAfful, Rita BSN, RN Entered By: Elpidio EricAfful, Rita on 06/07/2016 08:42:44 Gene Brown, Gene V. ([id number]) -------------------------------------------------------------------------------- Wound Assessment Details Patient Name: Gene Brown, Gene V. Date of Service: 06/07/2016 8:15 AM Medical Record Number: [id number] Patient Account Number: [id number] Date of Birth/Sex: 10/23/1950 34(65 y.o. Male) Treating RN: Clover MealyAfful, RN, BSN, Rita Primary Care Physician: Barbette ReichmannHande, Vishwanath Other Clinician: Referring Physician: Barbette ReichmannHande, Vishwanath Treating Physician/Extender: Maxwell CaulOBSON, MICHAEL G Weeks in Treatment: 2 Wound Status Wound Number: 1 Primary Diabetic Wound/Ulcer of the Lower Etiology: Extremity Wound Location: Left Toe Great - Plantar Secondary Pressure Ulcer Wounding Event: Blister Etiology: Date Acquired: 03/09/2015 Wound Open Weeks Of Treatment: 2 Status: Clustered Wound: No Comorbid Cataracts, Sickle Cell Disease, Sleep History: Apnea, Hypertension, Type II Diabetes, Gout, Osteoarthritis Photos Photo Uploaded By:  Elpidio EricAfful, Rita on 06/07/2016 17:07:05 Wound Measurements Length: (cm) 0.5 Width: (cm) 0.5 Depth: (cm) 0.1 Area: (cm) 0.196 Volume: (cm) 0.02 % Reduction in Area: 50.1% % Reduction in Volume: 48.7% Epithelialization: Medium (34-66%) Tunneling: No Undermining: No Wound Description Classification: Grade 2 Wound Margin: Flat and Intact Exudate Amount: Medium Exudate Type: Serosanguineous Exudate Color: red, brown Foul Odor After Cleansing: No Wound Bed Granulation Amount: Large (67-100%) Exposed Structure Necrotic Amount: None Present (0%) Fascia Exposed: No Fat Layer Exposed: No Tendon Exposed: No Gene Brown, Gene V. ([id number]) Muscle Exposed: No Joint Exposed: No Bone Exposed: No Limited to Skin Breakdown Periwound Skin Texture Texture Color No Abnormalities Noted: No No Abnormalities Noted: No Callus: No Atrophie Blanche: No Crepitus: No Cyanosis: No Excoriation: No Ecchymosis: No Fluctuance: No Erythema: No Friable: No Hemosiderin Staining: No Induration: No Mottled: No Localized Edema: No Pallor: No Rash: No Rubor: No Scarring: No Temperature / Pain Moisture Temperature: No Abnormality No Abnormalities Noted: No Dry / Scaly: No Maceration: No Moist: Yes Wound Preparation Ulcer Cleansing: Rinsed/Irrigated with Saline Topical Anesthetic Applied: Other: lidociane 4%, Treatment Notes Wound #1 (Left, Plantar Toe Great) 1. Cleansed with: Clean wound with Normal Saline 4. Dressing Applied: Aquacel Ag 6. Footwear/Offloading device applied Other footwear/offloading device applied (specify in notes) Notes Covereltte bandaid Insurance claims handlerront offloader Electronic Signature(s) Signed: 06/07/2016 5:16:16 PM By: Elpidio EricAfful, Rita BSN, RN Entered By: Elpidio EricAfful, Rita on 06/07/2016 08:25:04 Gene Brown, Dougles V. ([id number]) -------------------------------------------------------------------------------- Vitals Details Patient Name: Gene Brown, Johnavon V. Date of Service: 06/07/2016  8:15 AM Medical Record Number: [id number] Patient Account Number: [id number] Date of Birth/Sex: 06/02/1951 [age] y.o. Male) Treating RN: Clover MealyAfful, RN, BSN, [place] Primary Care Physician: Barbette ReichmannHande, Vishwanath Other Clinician: Referring Physician: Barbette ReichmannHande, Vishwanath Treating Physician/Extender: Maxwell CaulOBSON, MICHAEL G Weeks in Treatment: 2 Vital Signs Time Taken: 08:20 Temperature (F): 97.5 Height (in): 68 Pulse (bpm): 72 Weight (lbs): 245 Respiratory Rate (breaths/min): 17 Body  Mass Index (BMI): 37.2 Blood Pressure (mmHg): 157/84 Reference Range: 80 - 120 mg / dl Electronic Signature(s) Signed: 06/07/2016 5:16:16 PM By: Elpidio EricAfful, Rita BSN, RN Entered By: Elpidio EricAfful, Rita on 06/07/2016 08:21:17

## 2016-06-08 NOTE — Progress Notes (Signed)
HAWARD, POPE ([id number]) Visit Report for 06/07/2016 Chief Complaint Document Details Patient Name: Gene Brown, Gene Brown Date of Service: 06/07/2016 8:15 AM Medical Record Number: [id number] Patient Account Number: [id number] Date of Birth/Sex: [birth year] (65 y.o. Male) Treating RN: Clover Mealy, RN, BSN, Edith Endave Sink Primary Care Physician: Barbette Reichmann Other Clinician: Referring Physician: Barbette Reichmann Treating Physician/Extender: Linwood Dibbles, Ketara Cavness Weeks in Treatment: 2 Information Obtained from: Patient Chief Complaint Patient is here for review of the wound on his plantar left great toe that is been present off and on for a year Electronic Signature(s) Signed: 06/08/2016 1:37:38 AM By: Lenda Kelp PA-C Entered By: Lenda Kelp on 06/07/2016 08:56:04 Gene Brown ([id number]) -------------------------------------------------------------------------------- HPI Details Patient Name: Gene Brown Date of Service: 06/07/2016 8:15 AM Medical Record Number: [id number] Patient Account Number: [id number] Date of Birth/Sex: July 31, 1951 (65 y.o. Male) Treating RN: Clover Mealy, RN, BSN, [place] Sink Primary Care Physician: Barbette Reichmann Other Clinician: Referring Physician: Barbette Reichmann Treating Physician/Extender: Lenda Kelp Weeks in Treatment: 2 History of Present Illness HPI Description: 05/24/16; this is a 65 year old type II diabetic who probably has some degree of polyneuropathy. He tells me that he has had an area on the plantar aspect of his left great toe that is been followed by podiatry for most the last year. He has a lot of callus, the podiatrist which shave this down put a dressing on and he would follow pad monthly or sometimes longer intervals. He has not noted any drainage or pain. He has not had a recent x-ray. No serious attempt at offloading this with modified footwear.. Last hemoglobin A1c that he knows about was over 10 a month ago he is working hard on this  with his primary physician. More recently he has been soaking this with peroxide ABIs are noncompressible and the left leg. He was told by his podiatrist he has a bone spur underneath the wound but did not wish any surgical procedures. He has a caregiver at home for a very disabled wife, the nature of her illness is not certain 05/31/16 patient comes in much the same as last week amounts of callus nonviable subcutaneous tissue which required debridement. His x-ray suggested cortical bone irregularity. He will need an MRI. He tells me he has old screws/hardware left knee however he thinks he has had a subsequent MRI after this 06/07/16 at this point in time Asian has been attempting to offload his wound as much as he could on his own. He has made a conscious effort to avoid placing any pressure on the toe as he was moving around and walking and I do believe this has payed off and what I am seeing at this point in time. overall the wound appears to be somewhat improved in my opinion he is not having any significant discomfort at this point in time. In fact he tells me he has no pain at all. There is no interval sign of surrounding infection in the wound itself has actually decreased in size. Unfortunately the MRI was not ordered last week and therefore we do need to proceed with the MRI order today. again this is to evaluate the cortical bone irregularity noted on x-ray. Electronic Signature(s) Signed: 06/08/2016 1:37:38 AM By: Lenda Kelp PA-C Entered By: Lenda Kelp on 06/07/2016 08:58:22 Gene Brown ([id number]) -------------------------------------------------------------------------------- Physical Exam Details Patient Name: Gene Brown Date of Service: 06/07/2016 8:15 AM Medical Record Number: [id number] Patient Account Number: [id number] Date of Birth/Sex: 1951-07-25 (65 y.o. Male) Treating  RN: Clover MealyAfful, RN, BSN, [place] Sinkita Primary Care Physician: Barbette ReichmannHande, Vishwanath Other  Clinician: Referring Physician: Barbette ReichmannHande, Vishwanath Treating Physician/Extender: STONE III, Kazuko Clemence Weeks in Treatment: 2 Constitutional Well-nourished and well-hydrated in no acute distress. Respiratory normal breathing without difficulty. Cardiovascular 2+ dorsalis pedis/posterior tibialis pulses on the left. no clubbing, cyanosis, edema, <3 sec cap refill. Psychiatric this patient is able to make decisions and demonstrates good insight into disease process. Alert and Oriented x 3. pleasant and cooperative. Notes Patient has what appears to be fairly decent appearing wound at this point in time on the left plantar great toe. The wound is actually smaller than what it appeared last week and overall appears much cleaner as well at this point in time. There is no evidence of infection or erythema surrounding the wound. There is some mild callus just inferior to the wound which I was able to pare away today and patient tolerated this with no discomfort noted. Electronic Signature(s) Signed: 06/08/2016 1:37:38 AM By: Lenda KelpStone III, Lyniah Fujita PA-C Entered By: Lenda KelpStone III, Veronica Fretz on 06/07/2016 09:00:33 Gene BangGRIBBONS, Rodolfo V. ([id number]) -------------------------------------------------------------------------------- Physician Orders Details Patient Name: Gene BangGRIBBONS, Alphonza V. Date of Service: 06/07/2016 8:15 AM Medical Record Number: [id number] Patient Account Number: [id number] Date of Birth/Sex: 09/29/1950 (65 y.o. Male) Treating RN: Clover MealyAfful, RN, BSN, [place] Sinkita Primary Care Physician: Barbette ReichmannHande, Vishwanath Other Clinician: Referring Physician: Barbette ReichmannHande, Vishwanath Treating Physician/Extender: Skeet SimmerSTONE III, Verna Desrocher Weeks in Treatment: 2 Verbal / Phone Orders: Yes Clinician: Afful, RN, BSN, Rita Read Back and Verified: Yes Diagnosis Coding ICD-10 Coding Code Description E11.621 Type 2 diabetes mellitus with foot ulcer L97.523 Non-pressure chronic ulcer of other part of left foot with necrosis of muscle E11.42 Type 2 diabetes  mellitus with diabetic polyneuropathy Wound Cleansing Wound #1 Left,Plantar Toe Great o Clean wound with wound cleanser. Anesthetic Wound #1 Left,Plantar Toe Great o Topical Lidocaine 4% cream applied to wound bed prior to debridement Primary Wound Dressing Wound #1 Left,Plantar Toe Great o Aquacel Ag Secondary Dressing Wound #1 Left,Plantar Toe Great o Non-adherent pad - self adherent bandage Dressing Change Frequency Wound #1 Left,Plantar Toe Great o Change Dressing Monday, Wednesday, Friday Follow-up Appointments Wound #1 Left,Plantar Toe Great o Return Appointment in 1 week. Edema Control Wound #1 Left,Plantar Toe Great o Elevate legs to the level of the heart and pump ankles as often as possible Gene BangGRIBBONS, Rc V. ([id number]) Off-Loading Wound #1 Left,Plantar Toe Great o Open toe surgical shoe with peg assist. Additional Orders / Instructions Wound #1 Left,Plantar Toe Great o Increase protein intake. Electronic Signature(s) Signed: 06/08/2016 1:37:38 AM By: Lenda KelpStone III, Chai Verdejo PA-C Entered By: Lenda KelpStone III, Laryssa Hassing on 06/07/2016 08:54:53 Gene BangGRIBBONS, Aloysius V. ([id number]) -------------------------------------------------------------------------------- Problem List Details Patient Name: Gene BangGRIBBONS, Khian V. Date of Service: 06/07/2016 8:15 AM Medical Record Number: [id number] Patient Account Number: [id number] Date of Birth/Sex: 07/01/1951 23(65 y.o. 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 ([id number]) -------------------------------------------------------------------------------- Progress Note Details Patient Name: Gene BangGRIBBONS, Rodrick V. Date of Service: 06/07/2016 8:15 AM Medical Record Number: [id number]  Patient Account Number: [id number] Date of Birth/Sex: 1950/12/30 (65 y.o. Male) Treating RN: Clover Mealy, RN, BSN, [place] Sink Primary Care Physician: Barbette Reichmann Other Clinician: Referring Physician: Barbette Reichmann Treating Physician/Extender: Linwood Dibbles, Martita Brumm Weeks in Treatment: 2 Subjective Chief Complaint Information obtained from Patient Patient is here for review of the wound on his plantar left great toe that is been present off and on for a year History of Present Illness (HPI) 05/24/16; this is a 65 year old type II diabetic who probably has some degree of polyneuropathy. He tells me that he has had an area on the plantar aspect of his left great toe that is been followed by podiatry for most the last year. He has a lot of callus, the podiatrist which shave this down put a dressing on and he would follow pad monthly or sometimes longer intervals. He has not noted any drainage or pain. He has not had a recent x-ray. No serious attempt at offloading this with modified footwear.. Last hemoglobin A1c that he knows about was over 10 a month ago he is working hard on this with his primary physician. More recently he has been soaking this with peroxide ABIs are noncompressible and the left leg. He was told by his podiatrist he has a bone spur underneath the wound but did not wish any surgical procedures. He has a caregiver at home for a very disabled wife, the nature of her illness is not certain 05/31/16 patient comes in much the same as last week amounts of callus nonviable subcutaneous  tissue which required debridement. His x-ray suggested cortical bone irregularity. He will need an MRI. He tells me he has old screws/hardware left knee however he thinks he has had a subsequent MRI after this 06/07/16 at this point in time Asian has been attempting to offload his wound as much as he could on his own. He has made a conscious effort to avoid placing any pressure on the toe as he was moving around and walking and I do believe this has payed off and what I am seeing at this point in time. overall the wound appears to be somewhat improved in my opinion he is not having any significant discomfort at this point in time. In fact he tells me he has no pain at all. There is no interval sign of surrounding infection in the wound itself has actually decreased in size. Unfortunately the MRI was not ordered last week and therefore we do need to proceed with the MRI order today. again this is to evaluate the cortical bone irregularity noted on x-ray. Objective Constitutional Well-nourished and well-hydrated in no acute distress. RAYSHUN, KANDLER ([id number]) Vitals Time Taken: 8:20 AM, Height: 68 in, Weight: 245 lbs, BMI: 37.2, Temperature: 97.5 F, Pulse: 72 bpm, Respiratory Rate: 17 breaths/min, Blood Pressure: 157/84 mmHg. Respiratory normal breathing without difficulty. Cardiovascular 2+ dorsalis pedis/posterior tibialis pulses on the left. no clubbing, cyanosis, edema, Psychiatric this patient is able to make decisions and demonstrates good insight into disease process. Alert and Oriented x 3. pleasant and cooperative. General Notes: Patient has what appears to be fairly decent appearing wound at this point in time on the left plantar great toe. The wound is actually smaller than what it appeared last week and overall appears much cleaner as well at this point in time. There is no evidence of infection or erythema surrounding the wound. There is some mild callus just inferior to  the wound which I was able to pare away today and patient  tolerated this with no discomfort noted. Integumentary (Hair, Skin) Wound #1 status is Open. Original cause of wound was Blister. The wound is located on the [employer]. The wound measures 0.5cm length x 0.5cm width x 0.1cm depth; 0.196cm^2 area and 0.02cm^3 volume. The wound is limited to skin breakdown. There is no tunneling or undermining noted. There is a medium amount of serosanguineous drainage noted. The wound margin is flat and intact. There is large (67-100%) granulation within the wound bed. There is no necrotic tissue within the wound bed. The periwound skin appearance exhibited: Moist. The periwound skin appearance did not exhibit: Callus, Crepitus, Excoriation, Fluctuance, Friable, Induration, Localized Edema, Rash, Scarring, Dry/Scaly, Maceration, Atrophie Blanche, Cyanosis, Ecchymosis, Hemosiderin Staining, Mottled, Pallor, Rubor, Erythema. Periwound temperature was noted as No Abnormality. 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 Anesthetic: Wound #1 Left,Plantar Toe Great: Topical Lidocaine 4% cream applied to wound bed prior to debridement Primary Wound Dressing: Wound #1 Left,Plantar Toe Great: Aquacel Ag Secondary Dressing: Wound #1 Left,Plantar Toe Great: Non-adherent pad - self adherent bandage Dressing Change Frequency: Wound #1 Left,Plantar Toe Great: Change Dressing Monday, Wednesday, Friday Follow-up Appointments: Wound #1 Left,Plantar Toe Great: Return Appointment in 1 week. Edema Control: Wound #1 Left,Plantar Toe Great: Elevate legs to the level of the heart and pump  ankles as often as possible Off-Loading: Wound #1 Left,Plantar Toe Great: Open toe surgical shoe with peg assist. Additional Orders / Instructions: Wound #1 Left,Plantar Toe Great: Increase protein intake. Follow-Up Appointments: A Patient Clinical Summary of Care was provided to [facility] At this point in time again I was able to pare away a small amount of callus surrounding the wound. No sharp debridement was necessary at this point in time. Overall patient tolerated that without any complication. Subsequently I am recommending that we go forward with the MRI at this point in time of his left toe due to the cortical bone irregularity noted on x-ray. We will are going to place that order today. in the meantime we will continue with the silver alginate dressings. If patient has any other concerns or questions in the interim he will contact the office and let us know otherwise I will see him for reevaluation in one week. Electronic Signature(s) CHIMA, ASTORINO ([id number]) Signed: 06/08/2016 1:37:38 AM By: Lenda Kelp PA-C Entered By: Lenda Kelp on 06/07/2016 09:03:40 Gene Brown ([id number]) -------------------------------------------------------------------------------- SuperBill Details Patient Name: Gene Brown Date of Service: 06/07/2016 Medical Record Number: [id number] Patient Account Number: [id number] Date of Birth/Sex: [birth year] (65 y.o. Male) Treating RN: Clover Mealy, RN, BSN, [place] Sink Primary Care Physician: Barbette Reichmann Other Clinician: Referring Physician: Barbette Reichmann Treating Physician/Extender: Linwood Dibbles, Lorenso Quirino Weeks in Treatment: 2 Diagnosis Coding ICD-10 Codes Code Description E11.621 Type 2 diabetes mellitus with foot ulcer L97.523 Non-pressure chronic ulcer of other part of left foot with necrosis of muscle E11.42 Type 2 diabetes mellitus with diabetic polyneuropathy Facility Procedures CPT4 Code: [id number] Description: 32440 - WOUND CARE  VISIT-LEV 2 EST PT Modifier: Quantity: 1 Physician Procedures CPT4 Code Description: [id number] - WC PHYS LEVEL 3 - EST PT ICD-10 Description Diagnosis E11.621 Type 2 diabetes mellitus with foot ulcer L97.523 Non-pressure chronic ulcer of other part of left foo  E11.42 Type 2 diabetes mellitus with diabetic  polyneuropath Modifier: t with necrosis y Quantity: 1 of muscle Electronic Signature(s) Signed: 06/07/2016 5:09:52 PM By: Elpidio EricAfful, Rita BSN, RN Signed: 06/08/2016 1:37:38 AM By: Lenda KelpStone III, Matsue Strom PA-C Entered By: Elpidio EricAfful, Rita on 06/07/2016 17:09:51

## 2016-06-09 ENCOUNTER — Ambulatory Visit: Payer: Medicare Other

## 2016-06-14 ENCOUNTER — Encounter: Payer: Medicare Other | Attending: Physician Assistant | Admitting: Physician Assistant

## 2016-06-14 DIAGNOSIS — L97523 Non-pressure chronic ulcer of other part of left foot with necrosis of muscle: Secondary | ICD-10-CM | POA: Diagnosis not present

## 2016-06-14 DIAGNOSIS — E11621 Type 2 diabetes mellitus with foot ulcer: Secondary | ICD-10-CM | POA: Insufficient documentation

## 2016-06-14 DIAGNOSIS — E1142 Type 2 diabetes mellitus with diabetic polyneuropathy: Secondary | ICD-10-CM | POA: Insufficient documentation

## 2016-06-15 NOTE — Progress Notes (Signed)
Gene Brown, Markavious V. ([id number]) Visit Report for 06/14/2016 Chief Complaint Document Details Patient Name: Gene Brown, Fabian V. Date of Service: 06/14/2016 8:15 AM Medical Record Number: [id number] Patient Account Number: [id number] Date of Birth/Sex: 01/02/1951 (65 y.o. Male) Treating RN: Clover MealyAfful, RN, BSN, [place] Primary Care Physician: Barbette ReichmannHande, Vishwanath Other Clinician: Referring Physician: Barbette ReichmannHande, Vishwanath Treating Physician/Extender: Linwood DibblesSTONE III, HOYT Weeks in Treatment: 3 Information Obtained from: Patient Chief Complaint Patient is here for review of the wound on his plantar left great toe that is been present off and on for a year Electronic Signature(s) Signed: 06/15/2016 12:52:14 AM By: Lenda KelpStone III, Hoyt PA-C Entered By: Lenda KelpStone III, Hoyt on 06/14/2016 08:58:21 Gene Brown, Trendon V. ([id number]) -------------------------------------------------------------------------------- Debridement Details Patient Name: Gene Brown, Georgie V. Date of Service: 06/14/2016 8:15 AM Medical Record Number: [id number] Patient Account Number: [id number] Date of Birth/Sex: 10/12/1950 (65 y.o. Male) Treating RN: Clover MealyAfful, RN, BSN, Rita Primary Care Physician: Barbette ReichmannHande, Vishwanath Other Clinician: Referring Physician: Barbette ReichmannHande, Vishwanath Treating Physician/Extender: Linwood DibblesSTONE III, HOYT Weeks in Treatment: 3 Debridement Performed for Wound #1 Left,Plantar Toe Great Assessment: Performed By: Physician STONE III, HOYT E., PA-C Debridement: Debridement Pre-procedure Yes - 08:22 Verification/Time Out Taken: Start Time: 08:22 Pain Control: Lidocaine 4% Topical Solution Level: Skin/Subcutaneous Tissue Total Area Debrided (L x 0.4 (cm) x 0.3 (cm) = 0.12 (cm) W): Tissue and other Non-Viable, Callus material debrided: Instrument: Curette Bleeding: Minimum Hemostasis Achieved: Pressure End Time: 08:23 Procedural Pain: 0 Post Procedural Pain: 0 Response to Treatment: Procedure was tolerated well Post Debridement Measurements of  Total Wound Length: (cm) 0.4 Width: (cm) 0.3 Depth: (cm) 0.15 Volume: (cm) 0.014 Character of Wound/Ulcer Post Stable Debridement: Severity of Tissue Post Debridement: Fat layer exposed Post Procedure Diagnosis Same as Pre-procedure Electronic Signature(s) Signed: 06/14/2016 4:37:25 PM By: Elpidio EricAfful, Rita BSN, RN Signed: 06/15/2016 12:52:14 AM By: Lenda KelpStone III, Hoyt PA-C Entered By: Lenda KelpStone III, Hoyt on 06/14/2016 08:58:12 Gene Brown, Johathon V. ([name]) Gasser, Quintel V. ([id number]) -------------------------------------------------------------------------------- HPI Details Patient Name: Gene Brown, Siddhant V. Date of Service: 06/14/2016 8:15 AM Medical Record Number: [id number] Patient Account Number: [id number] Date of Birth/Sex: 05/17/1951 [age] y.o. Male) Treating RN: Clover MealyAfful, RN, BSN, Rita Primary Care Physician: Barbette ReichmannHande, Vishwanath Other Clinician: Referring Physician: Barbette ReichmannHande, Vishwanath Treating Physician/Extender: Linwood DibblesSTONE III, HOYT Weeks in Treatment: 3 History of Present Illness HPI Description: 05/24/16; this is a 65 year old type II diabetic who probably has some degree of polyneuropathy. He tells me that he has had an area on the plantar aspect of his left great toe that is been followed by podiatry for most the last year. He has a lot of callus, the podiatrist which shave this down put a dressing on and he would follow pad monthly or sometimes longer intervals. He has not noted any drainage or pain. He has not had a recent x-ray. No serious attempt at offloading this with modified footwear.. Last hemoglobin A1c that he knows about was over 10 a month ago he is working hard on this with his primary physician. More recently he has been soaking this with peroxide ABIs are noncompressible and the left leg. He was told by his podiatrist he has a bone spur underneath the wound but did not wish any surgical procedures. He has a caregiver at home for a very disabled wife, the nature of her illness is  not certain 05/31/16 patient comes in much the same as last week amounts of callus nonviable subcutaneous tissue which required debridement. His x-ray suggested cortical bone irregularity. He will need an MRI. He tells me he has  old screws/hardware left knee however he thinks he has had a subsequent MRI after this 06/07/16 at this point in time patient has been attempting to offload his wound as much as he could on his own. He has made a conscious effort to avoid placing any pressure on the toe as he was moving around and walking and I do believe this has payed off and what I am seeing at this point in time. overall the wound appears to be somewhat improved in my opinion he is not having any significant discomfort at this point in time. In fact he tells me he has no pain at all. There is no interval sign of surrounding infection in the wound itself has actually decreased in size. Unfortunately the MRI was not ordered last week and therefore we do need to proceed with the MRI order today. again this is to evaluate the cortical bone irregularity noted on x-ray. 06/14/16 Patient has continued to use the offloading shoe at this point in time. He tells me this is somewhat difficult to get used to walking and but nonetheless he is very strict about wearing it. Again I believe this has paid off very well for him and his wound appears to be greatly improved. He does have some callused area surrounding the wound proximally that has caused some fluid collection of maceration of the callused area. This likely will require debridement today. Some adherent slough noted over the wound bed. Electronic Signature(s) Signed: 06/15/2016 12:52:14 AM By: Lenda Kelp PA-C Entered By: Lenda Kelp on 06/14/2016 08:59:44 Gene Bang ([id number]) -------------------------------------------------------------------------------- Physical Exam Details Patient Name: Gene Bang Date of Service: 06/14/2016  8:15 AM Medical Record Number: [id number] Patient Account Number: [id number] Date of Birth/Sex: Dec 09, 1950 (65 y.o. Male) Treating RN: Clover Mealy, RN, BSN, [place] Sink Primary Care Physician: Barbette Reichmann Other Clinician: Referring Physician: Barbette Reichmann Treating Physician/Extender: STONE III, HOYT Weeks in Treatment: 3 Constitutional Well-nourished and well-hydrated in no acute distress. Respiratory normal breathing without difficulty. Psychiatric this patient is able to make decisions and demonstrates good insight into disease process. Alert and Oriented x 3. pleasant and cooperative. Notes Patient has adherent slough over the central aspect of the wound although this is minimal at this point in time. He also has surrounding macerated callus tissue that is collecting fluid on the proximal portion of the wound. This area is not open but has caused again maceration. No evidence oof obvious infection noted at this point. Electronic Signature(s) Signed: 06/15/2016 12:52:14 AM By: Lenda Kelp PA-C Entered By: Lenda Kelp on 06/14/2016 09:00:36 Gene Bang ([id number]) -------------------------------------------------------------------------------- Physician Orders Details Patient Name: Gene Bang Date of Service: 06/14/2016 8:15 AM Medical Record Number: [id number] Patient Account Number: [id number] Date of Birth/Sex: 05-Jul-1951 (65 y.o. Male) Treating RN: Clover Mealy, RN, BSN, [place] Sink Primary Care Physician: Barbette Reichmann Other Clinician: Referring Physician: Barbette Reichmann Treating Physician/Extender: Skeet Simmer in Treatment: 3 Verbal / Phone Orders: Yes Clinician: Afful, RN, BSN, Rita Read Back and Verified: Yes Diagnosis Coding Wound Cleansing Wound #1 Left,Plantar Toe Great o Clean wound with wound cleanser. Anesthetic Wound #1 Left,Plantar Toe Great o Topical Lidocaine 4% cream applied to wound bed prior to debridement Primary Wound  Dressing Wound #1 Left,Plantar Toe Great o Aquacel Ag Secondary Dressing Wound #1 Left,Plantar Toe Great o Non-adherent pad - self adherent bandage Dressing Change Frequency Wound #1 Left,Plantar Toe Great o Change Dressing Monday, Wednesday, Friday Follow-up Appointments Wound #1 Left,Plantar Toe Great o Return  Appointment in 1 week. Edema Control Wound #1 Left,Plantar Toe Great o Elevate legs to the level of the heart and pump ankles as often as possible Off-Loading Wound #1 Left,Plantar Toe Great o Open toe surgical shoe with peg assist. Additional Orders / Instructions [address]antar Toe Merry ProudGreat Jasinski, Daschel V. ([id number]) o Increase protein intake. Electronic Signature(s) Signed: 06/14/2016 4:37:25 PM By: Elpidio EricAfful, Rita BSN, RN Signed: 06/15/2016 12:52:14 AM By: Lenda KelpStone III, Hoyt PA-C Entered By: Elpidio EricAfful, Rita on 06/14/2016 08:27:11 Gene Brown, Rohan V. ([id number]) -------------------------------------------------------------------------------- Problem List Details Patient Name: Gene Brown, Duval V. Date of Service: 06/14/2016 8:15 AM Medical Record Number: [id number] Patient Account Number: [id number] Date of Birth/Sex: 09/17/1950 [age] y.o. 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  ([id number]) -------------------------------------------------------------------------------- Progress Note Details Patient Name: Gene Brown, Vin V. Date of Service: 06/14/2016 8:15 AM Medical Record Number: [id number] Patient Account Number: [id number] Date of Birth/Sex: 11/02/1950 [age] y.o. Male) Treating RN: Clover MealyAfful, RN, BSN, [place] Sinkita Primary Care Physician: Barbette ReichmannHande, Vishwanath Other Clinician: Referring Physician: Barbette ReichmannHande, Vishwanath Treating Physician/Extender: Linwood DibblesSTONE III, HOYT Weeks in Treatment: 3 Subjective Chief Complaint Information obtained from Patient Patient is here for review of the wound on his plantar left great toe that is been present off and on for a year History of Present Illness (HPI) 05/24/16; this is a 65 year old type II diabetic who probably has some degree of polyneuropathy. He tells me that he has had an area on the plantar aspect of his left great toe that is been followed by podiatry for most the last year. He has a lot of callus, the podiatrist which shave this down put a dressing on and he would follow pad monthly or sometimes longer intervals. He has not noted any drainage or pain. He has not had a recent x-ray. No serious attempt at offloading this with modified footwear.. Last hemoglobin A1c that he knows about was over 10 a month ago he is working hard on this with his primary physician. More recently he has been soaking this with peroxide ABIs are noncompressible and the left leg. He was told by his podiatrist he has a bone spur underneath the wound but did not wish any surgical procedures. He has a caregiver at home for a very disabled wife, the nature of her illness is not certain 05/31/16 patient comes in much the same as last week amounts of callus nonviable subcutaneous tissue which required debridement. His x-ray suggested cortical bone irregularity. He will need an MRI. He tells me he has old screws/hardware left knee however he thinks he has had a subsequent  MRI after this 06/07/16 at this point in time patient has been attempting to offload his wound as much as he could on his own. He has made a conscious effort to avoid placing any pressure on the toe as he was moving around and walking and I do believe this has payed off and what I am seeing at this point in time. overall the wound appears to  be somewhat improved in my opinion he is not having any significant discomfort at this point in time. In fact he tells me he has no pain at all. There is no interval sign of surrounding infection in the wound itself has actually decreased in size. Unfortunately the MRI was not ordered last week and therefore we do need to proceed with the MRI order today. again this is to evaluate the cortical bone irregularity noted on x-ray. 06/14/16 Patient has continued to use the offloading shoe at this point in time. He tells me this is somewhat difficult to get used to walking and but nonetheless he is very strict about wearing it. Again I believe this has paid off very well for him and his wound appears to be greatly improved. He does have some callused area surrounding the wound proximally that has caused some fluid collection of maceration of the callused area. This likely will require debridement today. Some adherent slough noted over the wound bed. TEIGE, ROUNTREE ([id number]) Objective Constitutional Well-nourished and well-hydrated in no acute distress. Vitals Time Taken: 8:12 AM, Height: 68 in, Weight: 245 lbs, BMI: 37.2, Temperature: 97.6 F, Pulse: 87 bpm, Respiratory Rate: 17 breaths/min, Blood Pressure: 145/68 mmHg. Respiratory normal breathing without difficulty. Psychiatric this patient is able to make decisions and demonstrates good insight into disease process. Alert and Oriented x 3. pleasant and cooperative. General Notes: Patient has adherent slough over the central aspect of the wound although this is minimal at this point in time. He also  has surrounding macerated callus tissue that is collecting fluid on the proximal portion of the wound. This area is not open but has caused again maceration. No evidence oof obvious infection noted at this point. Integumentary (Hair, Skin) Wound #1 status is Open. Original cause of wound was Blister. The wound is located on the [employer]. The wound measures 0.4cm length x 0.3cm width x 0.1cm depth; 0.094cm^2 area and 0.009cm^3 volume. The wound is limited to skin breakdown. There is no tunneling or undermining noted. There is a medium amount of serosanguineous drainage noted. The wound margin is flat and intact. There is large (67-100%) pink, pale granulation within the wound bed. There is no necrotic tissue within the wound bed. The periwound skin appearance exhibited: Callus, Moist. The periwound skin appearance did not exhibit: Crepitus, Excoriation, Fluctuance, Friable, Induration, Localized Edema, Rash, Scarring, Dry/Scaly, Maceration, Atrophie Blanche, Cyanosis, Ecchymosis, Hemosiderin Staining, Mottled, Pallor, Rubor, Erythema. Periwound temperature was noted as No Abnormality. 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 There was a Skin/Subcutaneous Tissue Debridement ([id number]) debridement with total area of 0.12 sq cm performed by STONE III, HOYT E., PA-C. with the following instrument(s): Curette to remove Non-Viable tissue/material including Callus after achieving pain control using Lidocaine 4% Topical Solution. A time out was conducted at 08:22, prior to the start of the procedure. A Minimum amount of bleeding was  controlled with Pressure. The procedure was tolerated well with a pain level of 0 throughout and a pain level of 0 following the procedure. Post Debridement Measurements: 0.4cm length x 0.3cm width x 0.15cm depth; 0.014cm^3 volume. Character of Wound/Ulcer Post Debridement is stable. Severity of Tissue Post  Debridement is: Fat layer exposed. Post procedure Diagnosis Wound #1: Same as Pre-Procedure Plan Wound Cleansing: Wound #1 Left,Plantar Toe Great: Clean wound with wound cleanser. Anesthetic: Wound #1 Left,Plantar Toe Great: Topical Lidocaine 4% cream applied to wound bed prior to debridement Primary Wound Dressing: Wound #1 Left,Plantar Toe Great: Aquacel Ag Secondary Dressing: Wound #1 Left,Plantar Toe Great: Non-adherent pad - self adherent bandage Dressing Change Frequency: Wound #1 Left,Plantar Toe Great: Change Dressing Monday, Wednesday, Friday Follow-up Appointments: Wound #1 Left,Plantar Toe Great: Return Appointment in 1 week. Edema Control: Wound #1 Left,Plantar Toe Great: Elevate legs to the level of the heart and pump ankles as often as possible Off-Loading: Wound #1 Left,Plantar Toe Great: Open toe surgical shoe with peg assist. Additional Orders / Instructions: Wound #1 Left,Plantar Toe Great: Increase protein intake. GURLEY, CLIMER ([id number]) Follow-Up Appointments: A Patient Clinical Summary of Care was provided to [facility] At this time I did perform a debridement removing this macerated callus tissue and opening the wound to be able to apply the silver alginate to the entire area and hopefully prevent this maceration going forward. This wound is getting close to closing currently. We are going to continue with the silver alginate dressing and why we'll also continue having this patient utilized the offloading shoe which has done excellent for him at this point I do not even feeel we need to consider total contact casting. We will see him for  reevaluation in 1 week he is still working to reschedule his MRI that was canceled by the facility he was going to have this performed at as the MRI machine was down. Again this is for evaluation of osteomyelitis and ensuring this is not present. Electronic Signature(s) Signed: 06/15/2016 12:52:14 AM By: Lenda Kelp PA-C Entered By: Lenda Kelp on 06/14/2016 09:02:07 Gene Bang ([id number]) -------------------------------------------------------------------------------- SuperBill Details Patient Name: Gene Bang Date of Service: 06/14/2016 Medical Record Number: [id number] Patient Account Number: [id number] Date of Birth/Sex: April 01, 1951 (65 y.o. Male) Treating RN: Clover Mealy, RN, BSN, [place] Sink Primary Care Physician: Barbette Reichmann Other Clinician: Referring Physician: Barbette Reichmann Treating Physician/Extender: Linwood Dibbles, HOYT Weeks in Treatment: 3 Diagnosis Coding ICD-10 Codes Code Description E11.621 Type 2 diabetes mellitus with foot ulcer L97.523 Non-pressure chronic ulcer of other part of left foot with necrosis of muscle E11.42 Type 2 diabetes mellitus with diabetic polyneuropathy Facility Procedures CPT4 Code Description: [id number] 11042 - DEB SUBQ TISSUE 20 SQ CM/< ICD-10 Description Diagnosis L97.523 Non-pressure chronic ulcer of other part of left foot Modifier: with necrosi Quantity: 1 s of muscle Physician Procedures CPT4 Code Description: [id number] 11042 - WC PHYS SUBQ TISS 20 SQ CM ICD-10 Description Diagnosis L97.523 Non-pressure chronic ulcer of other part of left foot Modifier: with necrosis Quantity: 1 of muscle Electronic Signature(s) Signed: 06/15/2016 12:52:14 AM By: Lenda Kelp PA-C Entered By: Lenda Kelp on 06/14/2016 09:02:16

## 2016-06-15 NOTE — Progress Notes (Signed)
TYLEEK, SMICK ([id number]) Visit Report for 06/14/2016 Arrival Information Details Patient Name: Gene, Brown Date of Service: 06/14/2016 8:15 AM Medical Record Number: [id number] Patient Account Number: [id number] Date of Birth/Sex: Jul 18, 1951 (65 y.o. Male) Treating RN: Clover Mealy, RN, BSN, [place] Sink Primary Care Physician: Barbette Reichmann Other Clinician: Referring Physician: Barbette Reichmann Treating Physician/Extender: Altamese Napoleon in Treatment: 3 Visit Information History Since Last Visit All ordered tests and consults were completed: No Patient Arrived: Ambulatory Added or deleted any medications: No Arrival Time: 08:10 Any new allergies or adverse reactions: No Accompanied By: self Had a fall or experienced change in No Transfer Assistance: None activities of daily living that may affect Patient Identification Verified: Yes risk of falls: Secondary Verification Process Yes Signs or symptoms of abuse/neglect since last No Completed: visito Patient Requires Transmission- No Hospitalized since last visit: No Based Precautions: Has Dressing in Place as Prescribed: Yes Patient Has Alerts: Yes Has Footwear/Offloading in Place as Yes Patient Alerts: Patient on Blood Prescribed: Thinner Left: Wedge Aspirin 325mg  Shoe Type II Diabetic 9/17 A1c 10 Pain Present Now: No ABI non- compressible (L) Electronic Signature(s) Signed: 06/14/2016 4:37:25 PM By: Elpidio Eric BSN, RN Entered By: Elpidio Eric on 06/14/2016 08:11:33 Gene Brown ([id number]) -------------------------------------------------------------------------------- Encounter Discharge Information Details Patient Name: Gene Brown Date of Service: 06/14/2016 8:15 AM Medical Record Number: [id number] Patient Account Number: [id number] Date of Birth/Sex: Oct 28, 1950 (65 y.o. Male) Treating RN: Clover Mealy, RN, BSN, [place] Sink Primary Care Physician: Barbette Reichmann Other Clinician: Referring Physician:  Barbette Reichmann Treating Physician/Extender: Linwood Dibbles, HOYT Weeks in Treatment: 3 Encounter Discharge Information Items Discharge Pain Level: 0 Discharge Condition: Unstable Ambulatory Status: Ambulatory Discharge Destination: Home Transportation: Private Auto Accompanied By: self Schedule Follow-up Appointment: No Medication Reconciliation completed and provided to Patient/Care No Caraline Deutschman: Provided on Clinical Summary of Care: 06/14/2016 Form Type Recipient Paper Patient PG Electronic Signature(s) Signed: 06/14/2016 8:35:40 AM By: Gwenlyn Perking Entered By: Gwenlyn Perking on 06/14/2016 08:35:40 Gene Brown ([id number]) -------------------------------------------------------------------------------- Lower Extremity Assessment Details Patient Name: Gene Brown Date of Service: 06/14/2016 8:15 AM Medical Record Number: [id number] Patient Account Number: [id number] Date of Birth/Sex: [birth year] (65 y.o. Male) Treating RN: Clover Mealy, RN, BSN, [place] Sink Primary Care Physician: Barbette Reichmann Other Clinician: Referring Physician: Barbette Reichmann Treating Physician/Extender: Linwood Dibbles, HOYT Weeks in Treatment: 3 Vascular Assessment Pulses: Posterior Tibial Dorsalis Pedis Palpable: [Left:Yes] Extremity colors, hair growth, and conditions: Extremity Color: [Left:Normal] Hair Growth on Extremity: [Left:Yes] Temperature of Extremity: [Left:Warm] Capillary Refill: [Left:< 3 seconds] Toe Nail Assessment Left: Right: Thick: No Discolored: No Deformed: No Improper Length and Hygiene: No Electronic Signature(s) Signed: 06/14/2016 4:37:25 PM By: Elpidio Eric BSN, RN Entered By: Elpidio Eric on 06/14/2016 08:14:02 Gene Brown ([id number]) -------------------------------------------------------------------------------- Multi Wound Chart Details Patient Name: Gene Brown Date of Service: 06/14/2016 8:15 AM Medical Record Number: [id number] Patient Account Number:  [id number] Date of Birth/Sex: 02/03/1951 (65 y.o. Male) Treating RN: Clover Mealy, RN, BSN,  Sink Primary Care Physician: Barbette Reichmann Other Clinician: Referring Physician: Barbette Reichmann Treating Physician/Extender: STONE III, HOYT Weeks in Treatment: 3 Vital Signs Height(in): 68 Pulse(bpm): 87 Weight(lbs): 245 Blood Pressure 145/68 (mmHg): Body Mass Index(BMI): 37 Temperature(F): 97.6 Respiratory Rate 17 (breaths/min): Photos: [1:No Photos] [N/A:N/A] Wound Location: [1:Left Toe Great - Plantar] [N/A:N/A] Wounding Event: [1:Blister] [N/A:N/A] Primary Etiology: [1:Diabetic Wound/Ulcer of the Lower Extremity] [N/A:N/A] Secondary Etiology: [1:Pressure Ulcer] [N/A:N/A] Comorbid History: [1:Cataracts, Sickle Cell Disease, Sleep Apnea, Hypertension, Type II Diabetes, Gout, Osteoarthritis] [N/A:N/A] Date Acquired: [1:03/09/2015] [N/A:N/A] Weeks of Treatment: [  1:3] [N/A:N/A] Wound Status: [1:Open] [N/A:N/A] Measurements L x W x D 0.4x0.3x0.1 [N/A:N/A] (cm) Area (cm) : [1:0.094] [N/A:N/A] Volume (cm) : [1:0.009] [N/A:N/A] % Reduction in Area: [1:76.10%] [N/A:N/A] % Reduction in Volume: 76.90% [N/A:N/A] Classification: [1:Grade 2] [N/A:N/A] Exudate Amount: [1:Medium] [N/A:N/A] Exudate Type: [1:Serosanguineous] [N/A:N/A] Exudate Color: [1:red, brown] [N/A:N/A] Wound Margin: [1:Flat and Intact] [N/A:N/A] Granulation Amount: [1:Large (67-100%)] [N/A:N/A] Granulation Quality: [1:Pink, Pale] [N/A:N/A] Necrotic Amount: [1:None Present (0%)] [N/A:N/A] Exposed Structures: [1:Fascia: No Fat: No] [N/A:N/A] Tendon: No Muscle: No Joint: No Bone: No Limited to Skin Breakdown Epithelialization: Medium (34-66%) N/A N/A Periwound Skin Texture: Callus: Yes N/A N/A Edema: No Excoriation: No Induration: No Crepitus: No Fluctuance: No Friable: No Rash: No Scarring: No Periwound Skin Moist: Yes N/A N/A Moisture: Maceration: No Dry/Scaly: No Periwound Skin Color: Atrophie Blanche:  No N/A N/A Cyanosis: No Ecchymosis: No Erythema: No Hemosiderin Staining: No Mottled: No Pallor: No Rubor: No Temperature: No Abnormality N/A N/A Tenderness on No N/A N/A Palpation: Wound Preparation: Ulcer Cleansing: N/A N/A Rinsed/Irrigated with Saline Topical Anesthetic Applied: Other: lidociane 4% Treatment Notes Electronic Signature(s) Signed: 06/14/2016 4:37:25 PM By: Elpidio Eric BSN, RN Entered By: Elpidio Eric on 06/14/2016 08:24:09 Gene Brown ([id number]) -------------------------------------------------------------------------------- Multi-Disciplinary Care Plan Details Patient Name: Gene Brown Date of Service: 06/14/2016 8:15 AM Medical Record Number: [id number] Patient Account Number: [id number] Date of Birth/Sex: [birth year] (65 y.o. Male) Treating RN: Clover Mealy, RN, BSN, [place] Sink Primary Care Physician: Barbette Reichmann Other Clinician: Referring Physician: Barbette Reichmann Treating Physician/Extender: Linwood Dibbles, HOYT Weeks in Treatment: 3 Active Inactive Nutrition Nursing Diagnoses: Potential for alteratiion in Nutrition/Potential for imbalanced nutrition Goals: Patient/caregiver will maintain therapeutic glucose control Date Initiated: 05/24/2016 Goal Status: Active Interventions: Assess HgA1c results as ordered upon admission and as needed Treatment Activities: Patient referred to diabetes educator : 05/24/2016 Notes: Orientation to the Wound Care Program Nursing Diagnoses: Knowledge deficit related to the wound healing center program Goals: Patient/caregiver will verbalize understanding of the Wound Healing Center Program Date Initiated: 05/24/2016 Goal Status: Active Interventions: Provide education on orientation to the wound center Notes: Pressure Nursing Diagnoses: Potential for impaired tissue integrity related to pressure, friction, moisture, and shear Gene Brown, Gene Brown ([id number]) Goals: Patient will remain free from development  of additional pressure ulcers Date Initiated: 05/24/2016 Goal Status: Active Patient/caregiver will verbalize risk factors for pressure ulcer development Date Initiated: 05/24/2016 Goal Status: Active Interventions: Assess: immobility, friction, shearing, incontinence upon admission and as needed Provide education on pressure ulcers Notes: Wound/Skin Impairment Nursing Diagnoses: Impaired tissue integrity Goals: Patient/caregiver will verbalize understanding of skin care regimen Date Initiated: 05/24/2016 Goal Status: Active Ulcer/skin breakdown will heal within 14 weeks Date Initiated: 05/24/2016 Goal Status: Active Interventions: Assess patient/caregiver ability to obtain necessary supplies Notes: Electronic Signature(s) Signed: 06/14/2016 4:37:25 PM By: Elpidio Eric BSN, RN Entered By: Elpidio Eric on 06/14/2016 08:18:04 Gene Brown ([id number]) -------------------------------------------------------------------------------- Pain Assessment Details Patient Name: Gene Brown Date of Service: 06/14/2016 8:15 AM Medical Record Number: [id number] Patient Account Number: [id number] Date of Birth/Sex: 1951-05-08 (65 y.o. Male) Treating RN: Clover Mealy, RN, BSN, [place] Sink Primary Care Physician: Barbette Reichmann Other Clinician: Referring Physician: Barbette Reichmann Treating Physician/Extender: Linwood Dibbles, HOYT Weeks in Treatment: 3 Active Problems Location of Pain Severity and Description of Pain Patient Has Paino No Site Locations With Dressing Change: No Pain Management and Medication Current Pain Management: Electronic Signature(s) Signed: 06/14/2016 4:37:25 PM By: Elpidio Eric BSN, RN Entered By: Elpidio Eric on 06/14/2016 08:11:41 Gene Brown ([id number]) -------------------------------------------------------------------------------- Patient/Caregiver Education Details  Patient Name: Gene Brown, Gene Brown. Date of Service: 06/14/2016 8:15 AM Medical Record Number:  [id number] Patient Account Number: [id number] Date of Birth/Gender: 08/24/1950 108(65 y.o. Male) Treating RN: Clover MealyAfful, RN, BSN, Rita Primary Care Physician: Barbette ReichmannHande, Vishwanath Other Clinician: Referring Physician: Barbette ReichmannHande, Vishwanath Treating Physician/Extender: Skeet SimmerSTONE III, HOYT Weeks in Treatment: 3 Education Assessment Education Provided To: Patient Education Topics Provided Pressure: Methods: Explain/Verbal Responses: State content correctly Welcome To The [facility] Center: Methods: Explain/Verbal Responses: State content correctly Wound Debridement: Methods: Explain/Verbal Responses: State content correctly Wound/Skin Impairment: Methods: Explain/Verbal Responses: State content correctly Electronic Signature(s) Signed: 06/14/2016 4:37:25 PM By: Elpidio EricAfful, Rita BSN, RN Entered By: Elpidio EricAfful, Rita on 06/14/2016 08:30:20 Gene Brown, Gene Brown. ([id number]) -------------------------------------------------------------------------------- Wound Assessment Details Patient Name: Gene Brown, Gene Brown. Date of Service: 06/14/2016 8:15 AM Medical Record Number: [id number] Patient Account Number: [id number] Date of Birth/Sex: 04/12/1951 [age] y.o. Male) Treating RN: Clover MealyAfful, RN, BSN, Rita Primary Care Physician: Barbette ReichmannHande, Vishwanath Other Clinician: Referring Physician: Barbette ReichmannHande, Vishwanath Treating Physician/Extender: Linwood DibblesSTONE III, HOYT Weeks in Treatment: 3 Wound Status Wound Number: 1 Primary Diabetic Wound/Ulcer of the Lower Etiology: Extremity Wound Location: Left Toe Great - Plantar Secondary Pressure Ulcer Wounding Event: Blister Etiology: Date Acquired: 03/09/2015 Wound Open Weeks Of Treatment: 3 Status: Clustered Wound: No Comorbid Cataracts, Sickle Cell Disease, Sleep History: Apnea, Hypertension, Type II Diabetes, Gout, Osteoarthritis Photos Photo Uploaded By: Elpidio EricAfful, Rita on 06/14/2016 16:27:10 Wound Measurements Length: (cm) 0.4 Width: (cm) 0.3 Depth: (cm) 0.1 Area: (cm) 0.094 Volume: (cm)  0.009 % Reduction in Area: 76.1% % Reduction in Volume: 76.9% Epithelialization: Medium (34-66%) Tunneling: No Undermining: No Wound Description Classification: Grade 2 Wound Margin: Flat and Intact Exudate Amount: Medium Exudate Type: Serosanguineous Exudate Color: red, brown Foul Odor After Cleansing: No Wound Bed Granulation Amount: Large (67-100%) Exposed Structure Granulation Quality: Pink, Pale Fascia Exposed: No Gene Brown, Gene Brown. ([id number]) Necrotic Amount: None Present (0%) Fat Layer Exposed: No Tendon Exposed: No Muscle Exposed: No Joint Exposed: No Bone Exposed: No Limited to Skin Breakdown Periwound Skin Texture Texture Color No Abnormalities Noted: No No Abnormalities Noted: No Callus: Yes Atrophie Blanche: No Crepitus: No Cyanosis: No Excoriation: No Ecchymosis: No Fluctuance: No Erythema: No Friable: No Hemosiderin Staining: No Induration: No Mottled: No Localized Edema: No Pallor: No Rash: No Rubor: No Scarring: No Temperature / Pain Moisture Temperature: No Abnormality No Abnormalities Noted: No Dry / Scaly: No Maceration: No Moist: Yes Wound Preparation Ulcer Cleansing: Rinsed/Irrigated with Saline Topical Anesthetic Applied: Other: lidociane 4%, Treatment Notes Wound #1 (Left, Plantar Toe Great) 1. Cleansed with: Clean wound with Normal Saline 4. Dressing Applied: Aquacel Ag 5. Secondary Dressing Applied Non-Adherent pad 6. Footwear/Offloading device applied Other footwear/offloading device applied (specify in notes) 7. Secured with Tape Notes Covereltte bandaid Front offloader Electronic Signature(s) Gene Brown, Gene Brown. ([id number]) Signed: 06/14/2016 4:37:25 PM By: Elpidio EricAfful, Rita BSN, RN Entered By: Elpidio EricAfful, Rita on 06/14/2016 08:17:59 Gene Brown, Gene Brown. ([id number]) -------------------------------------------------------------------------------- Vitals Details Patient Name: Gene Brown, Gene Brown. Date of Service: 06/14/2016 8:15  AM Medical Record Number: [id number] Patient Account Number: [id number] Date of Birth/Sex: 09/27/1950 [age] y.o. Male) Treating RN: Afful, RN, BSN, [place] Sinkita Primary Care Physician: Barbette ReichmannHande, Vishwanath Other Clinician: Referring Physician: Barbette ReichmannHande, Vishwanath Treating Physician/Extender: STONE III, HOYT Weeks in Treatment: 3 Vital Signs Time Taken: 08:12 Temperature (F): 97.6 Height (in): 68 Pulse (bpm): 87 Weight (lbs): 245 Respiratory Rate (breaths/min): 17 Body Mass Index (BMI): 37.2 Blood Pressure (mmHg): 145/68 Reference Range: 80 - 120 mg / dl Electronic Signature(s) Signed: 06/14/2016 4:37:25 PM By: Elpidio EricAfful, Rita  BSN, RN Entered By: Elpidio EricAfful, Rita on 06/14/2016 08:13:06

## 2016-06-21 ENCOUNTER — Encounter: Payer: Medicare Other | Admitting: Internal Medicine

## 2016-06-21 DIAGNOSIS — E11621 Type 2 diabetes mellitus with foot ulcer: Secondary | ICD-10-CM | POA: Diagnosis not present

## 2016-06-21 NOTE — Progress Notes (Addendum)
Gene BangGRIBBONS, Gene V. ([id number]) Visit Report for 06/21/2016 Arrival Information Details Patient Name: Gene BangGRIBBONS, Gene V. Date of Service: 06/21/2016 8:15 AM Medical Record Number: [id number] Patient Account Number: [id number] Date of Birth/Sex: 12/16/1950 (65 y.o. Male) Treating RN: Clover MealyAfful, RN, BSN, [place] Sinkita Primary Care Physician: Barbette ReichmannHande, Vishwanath Other Clinician: Referring Physician: Barbette ReichmannHande, Vishwanath Treating Physician/Extender: Altamese CarolinaOBSON, MICHAEL G Weeks in Treatment: 4 Visit Information History Since Last Visit All ordered tests and consults were completed: No Patient Arrived: Ambulatory Added or deleted any medications: No Arrival Time: 08:09 Any new allergies or adverse reactions: No Accompanied By: self Had a fall or experienced change in No Transfer Assistance: None activities of daily living that may affect Patient Identification Verified: Yes risk of falls: Secondary Verification Process Yes Signs or symptoms of abuse/neglect since last No Completed: visito Patient Requires Transmission- No Hospitalized since last visit: No Based Precautions: Has Dressing in Place as Prescribed: Yes Patient Has Alerts: Yes Has Footwear/Offloading in Place as Yes Patient Alerts: Patient on Blood Prescribed: Thinner Left: Wedge Aspirin 325mg  Shoe Type II Diabetic 9/17 A1c 10 Pain Present Now: No ABI non- compressible (L) Electronic Signature(s) Signed: 06/21/2016 8:10:19 AM By: Elpidio EricAfful, Rita BSN, RN Entered By: Elpidio EricAfful, Rita on 06/21/2016 08:10:19 Gene BangGRIBBONS, Gene V. ([id number]) -------------------------------------------------------------------------------- Clinic Level of Care Assessment Details Patient Name: Gene BangGRIBBONS, Gene V. Date of Service: 06/21/2016 8:15 AM Medical Record Number: [id number] Patient Account Number: [id number] Date of Birth/Sex: 06/29/1951 89(65 y.o. Male) Treating RN: Clover MealyAfful, RN, BSN, Rita Primary Care Physician: Barbette ReichmannHande, Vishwanath Other Clinician: Referring Physician:  Barbette ReichmannHande, Vishwanath Treating Physician/Extender: Altamese CarolinaOBSON, MICHAEL G Weeks in Treatment: 4 Clinic Level of Care Assessment Items TOOL 4 Quantity Score []  - Use when only an EandM is performed on FOLLOW-UP visit 0 ASSESSMENTS - Nursing Assessment / Reassessment X - Reassessment of Co-morbidities (includes updates in patient status) 1 10 X - Reassessment of Adherence to Treatment Plan 1 5 ASSESSMENTS - Wound and Skin Assessment / Reassessment []  - Simple Wound Assessment / Reassessment - one wound 0 X - Complex Wound Assessment / Reassessment - multiple wounds 1 5 []  - Dermatologic / Skin Assessment (not related to wound area) 0 ASSESSMENTS - Focused Assessment []  - Circumferential Edema Measurements - multi extremities 0 []  - Nutritional Assessment / Counseling / Intervention 0 X - Lower Extremity Assessment (monofilament, tuning fork, pulses) 1 5 []  - Peripheral Arterial Disease Assessment (using hand held doppler) 0 ASSESSMENTS - Ostomy and/or Continence Assessment and Care []  - Incontinence Assessment and Management 0 []  - Ostomy Care Assessment and Management (repouching, etc.) 0 PROCESS - Coordination of Care X - Simple Patient / Family Education for ongoing care 1 15 []  - Complex (extensive) Patient / Family Education for ongoing care 0 []  - Staff obtains ChiropractorConsents, Records, Test Results / Process Orders 0 []  - Staff telephones HHA, Nursing Homes / Clarify orders / etc 0 []  - Routine Transfer to another Facility (non-emergent condition) 0 Gene BangGRIBBONS, Gene V. ([id number]) []  - Routine Hospital Admission (non-emergent condition) 0 []  - New Admissions / Manufacturing engineernsurance Authorizations / Ordering NPWT, Apligraf, etc. 0 []  - Emergency Hospital Admission (emergent condition) 0 []  - Simple Discharge Coordination 0 []  - Complex (extensive) Discharge Coordination 0 PROCESS - Special Needs []  - Pediatric / Minor Patient Management 0 []  - Isolation Patient Management 0 []  - Hearing / Language / Visual  special needs 0 []  - Assessment of Community assistance (transportation, D/C planning, etc.) 0 []  - Additional assistance / Altered mentation 0 []  - Support Surface(s) Assessment (  bed, cushion, seat, etc.) 0 INTERVENTIONS - Wound Cleansing / Measurement []  - Simple Wound Cleansing - one wound 0 []  - Complex Wound Cleansing - multiple wounds 0 X - Wound Imaging (photographs - any number of wounds) 1 5 []  - Wound Tracing (instead of photographs) 0 []  - Simple Wound Measurement - one wound 0 []  - Complex Wound Measurement - multiple wounds 0 INTERVENTIONS - Wound Dressings []  - Small Wound Dressing one or multiple wounds 0 []  - Medium Wound Dressing one or multiple wounds 0 []  - Large Wound Dressing one or multiple wounds 0 []  - Application of Medications - topical 0 []  - Application of Medications - injection 0 INTERVENTIONS - Miscellaneous []  - External ear exam 0 [place], Gene Brown ([id number]) []  - Specimen Collection (cultures, biopsies, blood, body fluids, etc.) 0 []  - Specimen(s) / Culture(s) sent or taken to Lab for analysis 0 []  - Patient Transfer (multiple staff / Michiel Sites Lift / Similar devices) 0 []  - Simple Staple / Suture removal (25 or less) 0 []  - Complex Staple / Suture removal (26 or more) 0 []  - Hypo / Hyperglycemic Management (close monitor of Blood Glucose) 0 []  - Ankle / Brachial Index (ABI) - do not check if billed separately 0 X - Vital Signs 1 5 Has the patient been seen at the hospital within the last three years: Yes Total Score: 50 Level Of Care: New/Established - Level 2 Electronic Signature(s) Signed: 06/21/2016 4:40:19 PM By: Elpidio Eric BSN, RN Entered By: Elpidio Eric on 06/21/2016 08:20:12 Gene Brown ([id number]) -------------------------------------------------------------------------------- Encounter Discharge Information Details Patient Name: Gene Brown Date of Service: 06/21/2016 8:15 AM Medical Record Number: [id number] Patient Account  Number: [id number] Date of Birth/Sex: [birth year] (65 y.o. Male) Treating RN: Clover Mealy, RN, BSN, Rita Primary Care Physician: Barbette Reichmann Other Clinician: Referring Physician: Barbette Reichmann Treating Physician/Extender: Altamese [place] in Treatment: 4 Encounter Discharge Information Items Discharge Pain Level: 0 Discharge Condition: Stable Ambulatory Status: Ambulatory Discharge Destination: Home Transportation: Private Auto Accompanied By: self Schedule Follow-up Appointment: No Medication Reconciliation completed and provided to Patient/Care No Tatem Holsonback: Provided on Clinical Summary of Care: 06/21/2016 Form Type Recipient Paper Patient PG Electronic Signature(s) Signed: 06/21/2016 8:25:53 AM By: Gwenlyn Perking Entered By: Gwenlyn Perking on 06/21/2016 08:25:53 Gene Brown ([id number]) -------------------------------------------------------------------------------- Lower Extremity Assessment Details Patient Name: Gene Brown Date of Service: 06/21/2016 8:15 AM Medical Record Number: [id number] Patient Account Number: [id number] Date of Birth/Sex: [birth year] (65 y.o. Male) Treating RN: Clover Mealy, RN, BSN, [place] Sink Primary Care Physician: Barbette Reichmann Other Clinician: Referring Physician: Barbette Reichmann Treating Physician/Extender: Maxwell Caul Weeks in Treatment: 4 Vascular Assessment Pulses: Posterior Tibial Dorsalis Pedis Palpable: [Left:Yes] Extremity colors, hair growth, and conditions: Extremity Color: [Left:Normal] Hair Growth on Extremity: [Left:Yes] Temperature of Extremity: [Left:Warm] Capillary Refill: [Left:< 3 seconds] Toe Nail Assessment Left: Right: Thick: No Discolored: No Deformed: No Improper Length and Hygiene: No Electronic Signature(s) Signed: 06/21/2016 8:10:50 AM By: Elpidio Eric BSN, RN Entered By: Elpidio Eric on 06/21/2016 08:10:50 Gene Brown  ([id number]) -------------------------------------------------------------------------------- Multi Wound Chart Details Patient Name: Gene Brown Date of Service: 06/21/2016 8:15 AM Medical Record Number: [id number] Patient Account Number: [id number] Date of Birth/Sex: 1950/09/12 (65 y.o. Male) Treating RN: Clover Mealy, RN, BSN, [place] Sink Primary Care Physician: Barbette Reichmann Other Clinician: Referring Physician: Barbette Reichmann Treating Physician/Extender: Maxwell Caul Weeks in Treatment: 4 Vital Signs Height(in): 68 Pulse(bpm): 76 Weight(lbs): 245 Blood Pressure 160/78 (mmHg): Body Mass Index(BMI): 37 Temperature(F): 97.9 Respiratory Rate  17 (breaths/min): Photos: [1:No Photos] [N/A:N/A] Wound Location: [1:Left Toe Great - Plantar] [N/A:N/A] Wounding Event: [1:Blister] [N/A:N/A] Primary Etiology: [1:Diabetic Wound/Ulcer of the Lower Extremity] [N/A:N/A] Secondary Etiology: [1:Pressure Ulcer] [N/A:N/A] Comorbid History: [1:Cataracts, Sickle Cell Disease, Sleep Apnea, Hypertension, Type II Diabetes, Gout, Osteoarthritis] [N/A:N/A] Date Acquired: [1:03/09/2015] [N/A:N/A] Weeks of Treatment: [1:4] [N/A:N/A] Wound Status: [1:Healed - Epithelialized] [N/A:N/A] Measurements L x W x D 0x0x0 [N/A:N/A] (cm) Area (cm) : [1:0] [N/A:N/A] Volume (cm) : [1:0] [N/A:N/A] % Reduction in Area: [1:100.00%] [N/A:N/A] % Reduction in Volume: 100.00% [N/A:N/A] Classification: [1:Grade 2] [N/A:N/A] Exudate Amount: [1:None Present] [N/A:N/A] Wound Margin: [1:Flat and Intact] [N/A:N/A] Granulation Amount: [1:None Present (0%)] [N/A:N/A] Necrotic Amount: [1:None Present (0%)] [N/A:N/A] Exposed Structures: [1:Fascia: No Fat: No Tendon: No Muscle: No Joint: No] [N/A:N/A] Bone: No Limited to Skin Breakdown Epithelialization: Large (67-100%) N/A N/A Periwound Skin Texture: Callus: Yes N/A N/A Edema: No Excoriation: No Induration: No Crepitus: No Fluctuance: No Friable: No Rash:  No Scarring: No Periwound Skin Dry/Scaly: Yes N/A N/A Moisture: Maceration: No Moist: No Periwound Skin Color: Atrophie Blanche: No N/A N/A Cyanosis: No Ecchymosis: No Erythema: No Hemosiderin Staining: No Mottled: No Pallor: No Rubor: No Temperature: No Abnormality N/A N/A Tenderness on No N/A N/A Palpation: Wound Preparation: Ulcer Cleansing: N/A N/A Rinsed/Irrigated with Saline Topical Anesthetic Applied: None Treatment Notes Electronic Signature(s) Signed: 06/21/2016 4:40:19 PM By: Elpidio EricAfful, Rita BSN, RN Entered By: Elpidio EricAfful, Rita on 06/21/2016 08:18:53 Gene BangGRIBBONS, Malachy V. ([id number]) -------------------------------------------------------------------------------- Multi-Disciplinary Care Plan Details Patient Name: Gene BangGRIBBONS, Niki V. Date of Service: 06/21/2016 8:15 AM Medical Record Number: [id number] Patient Account Number: [id number] Date of Birth/Sex: 01/04/1951 9(65 y.o. Male) Treating RN: Clover MealyAfful, RN, BSN, [place] Sinkita Primary Care Physician: Barbette ReichmannHande, Vishwanath Other Clinician: Referring Physician: Barbette ReichmannHande, Vishwanath Treating Physician/Extender: Maxwell CaulOBSON, MICHAEL G Weeks in Treatment: 4 Active Inactive Electronic Signature(s) Signed: 06/21/2016 4:40:19 PM By: Elpidio EricAfful, Rita BSN, RN Entered By: Elpidio EricAfful, Rita on 06/21/2016 08:18:36 Gene BangGRIBBONS, Meldon V. ([id number]) -------------------------------------------------------------------------------- Pain Assessment Details Patient Name: Gene BangGRIBBONS, Deaaron V. Date of Service: 06/21/2016 8:15 AM Medical Record Number: [id number] Patient Account Number: [id number] Date of Birth/Sex: 03/12/1951 [age] y.o. Male) Treating RN: Clover MealyAfful, RN, BSN, [place] Sinkita Primary Care Physician: Barbette ReichmannHande, Vishwanath Other Clinician: Referring Physician: Barbette ReichmannHande, Vishwanath Treating Physician/Extender: Maxwell CaulOBSON, MICHAEL G Weeks in Treatment: 4 Active Problems Location of Pain Severity and Description of Pain Patient Has Paino No Site Locations With Dressing Change: No Pain Management  and Medication Current Pain Management: Electronic Signature(s) Signed: 06/21/2016 8:10:27 AM By: Elpidio EricAfful, Rita BSN, RN Entered By: Elpidio EricAfful, Rita on 06/21/2016 08:10:26 Gene BangGRIBBONS, [place] V. ([id number]) -------------------------------------------------------------------------------- Patient/Caregiver Education Details Patient Name: Gene BangGRIBBONS, Rynell V. Date of Service: 06/21/2016 8:15 AM Medical Record Number: [id number] Patient Account Number: [id number] Date of Birth/Gender: 08/18/1950 [age] y.o. Male) Treating RN: Clover MealyAfful, RN, BSN, Rita Primary Care Physician: Barbette ReichmannHande, Vishwanath Other Clinician: Referring Physician: Barbette ReichmannHande, Vishwanath Treating Physician/Extender: Altamese CarolinaOBSON, MICHAEL G Weeks in Treatment: 4 Education Assessment Education Provided To: Patient Education Topics Provided Basic Hygiene: Methods: Explain/Verbal Responses: State content correctly Offloading: Methods: Explain/Verbal Responses: State content correctly Electronic Signature(s) Signed: 06/21/2016 4:40:19 PM By: Elpidio EricAfful, Rita BSN, RN Entered By: Elpidio EricAfful, Rita on 06/21/2016 08:20:55 Gene BangGRIBBONS, [place] V. ([id number]) -------------------------------------------------------------------------------- Wound Assessment Details Patient Name: Gene BangGRIBBONS, Thiago V. Date of Service: 06/21/2016 8:15 AM Medical Record Number: [id number] Patient Account Number: [id number] Date of Birth/Sex: 09/28/1950 [age] y.o. Male) Treating RN: Clover MealyAfful, RN, BSN, [place] Sinkita Primary Care Physician: Barbette ReichmannHande, Vishwanath Other Clinician: Referring Physician: Barbette ReichmannHande, Vishwanath Treating Physician/Extender: Maxwell CaulOBSON, MICHAEL G Weeks in Treatment: 4 Wound Status Wound Number: 1  Primary Diabetic Wound/Ulcer of the Lower Etiology: Extremity Wound Location: Left Toe Great - Plantar Secondary Pressure Ulcer Wounding Event: Blister Etiology: Date Acquired: 03/09/2015 Wound Healed - Epithelialized Weeks Of Treatment: 4 Status: Clustered Wound: No Comorbid Cataracts, Sickle Cell  Disease, Sleep History: Apnea, Hypertension, Type II Diabetes, Gout, Osteoarthritis Photos Photo Uploaded By: Elpidio Eric on 06/21/2016 16:30:23 Wound Measurements Length: (cm) 0 % Reduction Width: (cm) 0 % Reduction Depth: (cm) 0 Epithelializ Area: (cm) 0 Tunneling: Volume: (cm) 0 Undermining in Area: 100% in Volume: 100% ation: Large (67-100%) No : No Wound Description Classification: Grade 2 Wound Margin: Flat and Intact Exudate Amount: None Present Foul Odor After Cleansing: No Wound Bed Granulation Amount: None Present (0%) Exposed Structure Necrotic Amount: None Present (0%) Fascia Exposed: No Fat Layer Exposed: No Tendon Exposed: No SALEH, ULBRICH ([id number]) Muscle Exposed: No Joint Exposed: No Bone Exposed: No Limited to Skin Breakdown Periwound Skin Texture Texture Color No Abnormalities Noted: No No Abnormalities Noted: No Callus: Yes Atrophie Blanche: No Crepitus: No Cyanosis: No Excoriation: No Ecchymosis: No Fluctuance: No Erythema: No Friable: No Hemosiderin Staining: No Induration: No Mottled: No Localized Edema: No Pallor: No Rash: No Rubor: No Scarring: No Temperature / Pain Moisture Temperature: No Abnormality No Abnormalities Noted: No Dry / Scaly: Yes Maceration: No Moist: No Wound Preparation Ulcer Cleansing: Rinsed/Irrigated with Saline Topical Anesthetic Applied: None Electronic Signature(s) Signed: 06/21/2016 4:40:19 PM By: Elpidio Eric BSN, RN Entered By: Elpidio Eric on 06/21/2016 08:18:18 Gene Brown ([id number]) -------------------------------------------------------------------------------- Vitals Details Patient Name: Gene Brown Date of Service: 06/21/2016 8:15 AM Medical Record Number: [id number] Patient Account Number: [id number] Date of Birth/Sex: November 26, 1950 (65 y.o. Male) Treating RN: Clover Mealy, RN, BSN, Rita Primary Care Physician: Barbette Reichmann Other Clinician: Referring Physician: Barbette Reichmann Treating Physician/Extender: Maxwell Caul Weeks in Treatment: 4 Vital Signs Time Taken: 08:13 Temperature (F): 97.9 Height (in): 68 Pulse (bpm): 76 Weight (lbs): 245 Respiratory Rate (breaths/min): 17 Body Mass Index (BMI): 37.2 Blood Pressure (mmHg): 160/78 Reference Range: 80 - 120 mg / dl Electronic Signature(s) Signed: 06/21/2016 4:40:19 PM By: Elpidio Eric BSN, RN Entered By: Elpidio Eric on 06/21/2016 08:14:25

## 2016-06-22 NOTE — Progress Notes (Signed)
ELAI, VANWYK ([id number]) Visit Report for 06/21/2016 Chief Complaint Document Details Patient Name: Gene Brown, Gene Brown Date of Service: 06/21/2016 8:15 AM Medical Record Number: [id number] Patient Account Number: [id number] Date of Birth/Sex: 1951/03/22 (65 y.o. Male) Treating RN: Clover Mealy, RN, BSN, [place] Sink Primary Care Physician: Barbette Reichmann Other Clinician: Referring Physician: Barbette Reichmann Treating Physician/Extender: Altamese [place] in Treatment: 4 Information Obtained from: Patient Chief Complaint Patient is here for review of the wound on his plantar left great toe that is been present off and on for a year Electronic Signature(s) Signed: 06/21/2016 4:42:06 PM By: Baltazar Najjar MD Entered By: Baltazar Najjar on 06/21/2016 08:34:55 Gene Brown ([id number]) -------------------------------------------------------------------------------- HPI Details Patient Name: Gene Brown Date of Service: 06/21/2016 8:15 AM Medical Record Number: [id number] Patient Account Number: [id number] Date of Birth/Sex: 10/03/1950 (65 y.o. Male) Treating RN: Clover Mealy, RN, BSN, [place] Sink Primary Care Physician: Barbette Reichmann Other Clinician: Referring Physician: Barbette Reichmann Treating Physician/Extender: Altamese [place] in Treatment: 4 History of Present Illness HPI Description: 05/24/16; this is a 65 year old type II diabetic who probably has some degree of polyneuropathy. He tells me that he has had an area on the plantar aspect of his left great toe that is been followed by podiatry for most the last year. He has a lot of callus, the podiatrist which shave this down put a dressing on and he would follow pad monthly or sometimes longer intervals. He has not noted any drainage or pain. He has not had a recent x-ray. No serious attempt at offloading this with modified footwear.. Last hemoglobin A1c that he knows about was over 10 a month ago he is working hard on this  with his primary physician. More recently he has been soaking this with peroxide ABIs are noncompressible and the left leg. He was told by his podiatrist he has a bone spur underneath the wound but did not wish any surgical procedures. He has a caregiver at home for a very disabled wife, the nature of her illness is not certain 05/31/16 patient comes in much the same as last week amounts of callus nonviable subcutaneous tissue which required debridement. His x-ray suggested cortical bone irregularity. He will need an MRI. He tells me he has old screws/hardware left knee however he thinks he has had a subsequent MRI after this 06/07/16 at this point in time patient has been attempting to offload his wound as much as he could on his own. He has made a conscious effort to avoid placing any pressure on the toe as he was moving around and walking and I do believe this has payed off and what I am seeing at this point in time. overall the wound appears to be somewhat improved in my opinion he is not having any significant discomfort at this point in time. In fact he tells me he has no pain at all. There is no interval sign of surrounding infection in the wound itself has actually decreased in size. Unfortunately the MRI was not ordered last week and therefore we do need to proceed with the MRI order today. again this is to evaluate the cortical bone irregularity noted on x-ray. 06/14/16 Patient has continued to use the offloading shoe at this point in time. He tells me this is somewhat difficult to get used to walking and but nonetheless he is very strict about wearing it. Again I believe this has paid off very well for him and his wound appears to be greatly improved. He  does have some callused area surrounding the wound proximally that has caused some fluid collection of maceration of the callused area. This likely will require debridement today. Some adherent slough noted over the wound  bed. 06/21/16; patient's wound on the plantar aspect of his left great toe is totally epithelialized. His MRI of the foot is tomorrow to follow-up on the cortical irregularity noted on plain x-ray. He does not have diabetic foot where Electronic Signature(s) Signed: 06/21/2016 4:42:06 PM By: Baltazar Najjar MD Entered By: Baltazar Najjar on 06/21/2016 08:35:35 Gene Brown ([id number]) -------------------------------------------------------------------------------- Physical Exam Details Patient Name: Gene Brown Date of Service: 06/21/2016 8:15 AM Medical Record Number: [id number] Patient Account Number: [id number] Date of Birth/Sex: November 03, 1950 (65 y.o. Male) Treating RN: Clover Mealy, RN, BSN, [place] Sink Primary Care Physician: Barbette Reichmann Other Clinician: Referring Physician: Barbette Reichmann Treating Physician/Extender: Maxwell Caul Weeks in Treatment: 4 Constitutional Patient is hypertensive.. Pulse regular and within target range for patient.Marland Kitchen Respirations regular, non-labored and within target range.. Temperature is normal and within the target range for the patient.. Patient's appearance is neat and clean. Appears in no acute distress. Well nourished and well developed.. Notes Wound exam; the plantar left great toe is totally healed over normal epithelialization. No debridement was necessary. At this point I think the wound is successfully closed over Electronic Signature(s) Signed: 06/21/2016 4:42:06 PM By: Baltazar Najjar MD Entered By: Baltazar Najjar on 06/21/2016 08:36:29 Gene Brown ([id number]) -------------------------------------------------------------------------------- Physician Orders Details Patient Name: Gene Brown Date of Service: 06/21/2016 8:15 AM Medical Record Number: [id number] Patient Account Number: [id number] Date of Birth/Sex: 06/03/1951 (65 y.o. Male) Treating RN: Clover Mealy, RN, BSN, [place] Sink Primary Care Physician: Barbette Reichmann Other Clinician: Referring Physician: Barbette Reichmann Treating Physician/Extender: Altamese [place] in Treatment: 4 Verbal / Phone Orders: Yes Clinician: Afful, RN, BSN, Rita Read Back and Verified: Yes Diagnosis Coding Follow-up Appointments o Return Appointment in 1 week. - To read and talk over MRI. Then final discharge Electronic Signature(s) Signed: 06/21/2016 4:40:19 PM By: Elpidio Eric BSN, RN Signed: 06/21/2016 4:42:06 PM By: Baltazar Najjar MD Entered By: Elpidio Eric on 06/21/2016 08:19:48 Gene Brown ([id number]) -------------------------------------------------------------------------------- Problem List Details Patient Name: Gene Brown Date of Service: 06/21/2016 8:15 AM Medical Record Number: [id number] Patient Account Number: [id number] Date of Birth/Sex: 04-25-1951 (65 y.o. 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 Male) Treating RN: Clover Mealy, RN, BSN, [place] Primary Care Physician: Barbette Reichmann Other Clinician: Referring Physician: Barbette Reichmann Treating Physician/Extender: Altamese [place] in Treatment: 4 Subjective Chief Complaint Information obtained from Patient Patient is here for review of the wound on his plantar left great toe that is been present off and on for a year History of Present Illness (HPI) 05/24/16; this is a 65 year old type II diabetic who probably has  some degree of polyneuropathy. He tells me that he has had an area on the plantar aspect of his left great toe that is been followed by podiatry for most the last year. He has a lot of callus, the podiatrist which shave this down put a dressing on and he would follow pad monthly or sometimes longer intervals. He has not noted any drainage or pain. He has not had a recent x-ray. No serious attempt at offloading this with modified footwear.. Last hemoglobin A1c that he knows about was over 10 a month ago he is working hard on this with his primary physician. More recently he has been soaking this with peroxide ABIs are noncompressible and the left leg. He was told by his podiatrist he has a bone spur underneath the wound but did not wish any surgical procedures. He has a caregiver at home for a very disabled wife, the nature of her illness is not certain 05/31/16 patient comes in much the same as last week amounts of callus nonviable subcutaneous tissue which required debridement. His x-ray suggested cortical bone irregularity. He will need an MRI. He tells me he has old screws/hardware left knee however he thinks he has had a subsequent MRI after this 06/07/16 at this point in time patient has been attempting to offload his wound as much as he could on his own. He has made a conscious effort to avoid placing any pressure on the toe as he was moving around and walking and I do believe this has payed off and what I am seeing at this point in time. overall the wound appears to be somewhat  improved in my opinion he is not having any significant discomfort at this point in time. In fact he tells me he has no pain at all. There is no interval sign of surrounding infection in the wound itself has actually decreased in size. Unfortunately the MRI was not ordered last week and therefore we do need to proceed with the MRI order today. again this is to evaluate the cortical bone irregularity noted on x-ray. 06/14/16 Patient has continued to use the offloading shoe at this point in time. He tells me this is somewhat difficult to get used to walking and but nonetheless he is very strict about wearing it. Again I believe this has paid off very well for him and his wound appears to be greatly improved. He does have some callused area surrounding the wound proximally that has caused some fluid collection of maceration of the callused area. This likely will require debridement today. Some adherent slough noted over the wound bed. 06/21/16; patient's wound on the plantar aspect of his left great toe is totally epithelialized. His MRI of the foot is tomorrow to follow-up on the cortical irregularity noted on plain x-ray. He does not have diabetic foot where Gene Brown, Gene V. ([id number]) Objective Constitutional Patient is hypertensive.. Pulse regular and within target range for patient.Marland Kitchen. Respirations regular, non-labored and within target range.. Temperature is normal and within the target range for the patient.. Patient's appearance is neat and clean. Appears in no acute distress. Well nourished and well developed.. Vitals Time Taken: 8:13 AM, Height: 68 in, Weight: 245 lbs, BMI: 37.2, Temperature: 97.9 F, Pulse: 76 bpm, Respiratory Rate: 17 breaths/min, Blood Pressure: 160/78 mmHg. General Notes: Wound exam; the plantar left great toe is totally healed over normal epithelialization. No debridement was necessary. At this point I think the wound is successfully closed over Integumentary  (Hair, Skin) Wound #  1 status is Healed - Epithelialized. Original cause of wound was Blister. The wound is located on the Masco CorporationLeft,Plantar Toe Great. The wound measures 0cm length x 0cm width x 0cm depth; 0cm^2 area and 0cm^3 volume. The wound is limited to skin breakdown. There is no tunneling or undermining noted. There is a none present amount of drainage noted. The wound margin is flat and intact. There is no granulation within the wound bed. There is no necrotic tissue within the wound bed. The periwound skin appearance exhibited: Callus, Dry/Scaly. The periwound skin appearance did not exhibit: Crepitus, Excoriation, Fluctuance, Friable, Induration, Localized Edema, Rash, Scarring, Maceration, Moist, Atrophie Blanche, Cyanosis, Ecchymosis, Hemosiderin Staining, Mottled, Pallor, Rubor, Erythema. Periwound temperature was noted as No Abnormality. 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 ([id number]) Return Appointment in 1 week. - To read and talk over MRI. Then final discharge #1 I've simply recommended to continue to offload this area for another week. He is using a [employer] forefoot offload her. We'll bring him back next week to discuss his MRI which is being done 2 required diabetic foot where which will necessitate a prescription from the physician managing his diabetes [primary Medicare] Electronic Signature(s) Signed: 06/21/2016 4:42:06 PM By: Baltazar Najjarobson, Mirka Barbone MD Entered By: Baltazar Najjarobson, Akisha Sturgill on 06/21/2016 08:37:27 Gene Brown, Burch V. ([id number]) -------------------------------------------------------------------------------- SuperBill Details Patient Name: Gene Brown, Isaish V. Date of Service: 06/21/2016 Medical Record Number: [id number] Patient  Account Number: [id number] Date of Birth/Sex: 01/02/1951 [age] y.o. Male) Treating RN: Clover MealyAfful, RN, BSN, [place] Sinkita Primary Care Physician: Barbette ReichmannHande, Vishwanath Other Clinician: Referring Physician: Barbette ReichmannHande, Vishwanath Treating Physician/Extender: Maxwell CaulOBSON, Jael Kostick G Weeks in Treatment: 4 Diagnosis Coding ICD-10 Codes Code Description E11.621 Type 2 diabetes mellitus with foot ulcer L97.523 Non-pressure chronic ulcer of other part of left foot with necrosis of muscle E11.42 Type 2 diabetes mellitus with diabetic polyneuropathy Facility Procedures CPT4 Code: [id number] Description: [id number] - WOUND CARE VISIT-LEV 2 EST PT Modifier: Quantity: 1 Physician Procedures CPT4 Code Description: [id number] - WC PHYS LEVEL 2 - EST PT ICD-10 Description Diagnosis E11.621 Type 2 diabetes mellitus with foot ulcer L97.523 Non-pressure chronic ulcer of other part of left foo Modifier: t with necrosis Quantity: 1 of muscle Electronic Signature(s) Signed: 06/21/2016 4:42:06 PM By: Baltazar Najjarobson, Liv Rallis MD Entered By: Baltazar Najjarobson, Deondrick Searls on 06/21/2016 08:37:58

## 2016-06-23 ENCOUNTER — Ambulatory Visit
Admission: RE | Admit: 2016-06-23 | Discharge: 2016-06-23 | Disposition: A | Discharge status: Home / Self Care | Payer: Medicare Other | Source: Ambulatory Visit | Attending: Internal Medicine | Admitting: Internal Medicine

## 2016-06-23 DIAGNOSIS — R609 Edema, unspecified: Secondary | ICD-10-CM | POA: Insufficient documentation

## 2016-06-23 DIAGNOSIS — X58XXXA Exposure to other specified factors, initial encounter: Secondary | ICD-10-CM | POA: Insufficient documentation

## 2016-06-23 DIAGNOSIS — M86672 Other chronic osteomyelitis, left ankle and foot: Secondary | ICD-10-CM

## 2016-06-23 DIAGNOSIS — S91102A Unspecified open wound of left great toe without damage to nail, initial encounter: Secondary | ICD-10-CM | POA: Diagnosis present

## 2016-06-23 LAB — POCT I-STAT CREATININE: CREATININE: 0.9 mg/dL (ref 0.61–1.24)

## 2016-06-23 MED ORDER — GADOBENATE DIMEGLUMINE 529 MG/ML IV SOLN
20.0000 mL | Freq: Once | INTRAVENOUS | Status: AC | PRN
  Administered 2016-06-23: 20 mL via INTRAVENOUS

## 2016-06-28 ENCOUNTER — Encounter: Payer: Medicare Other | Admitting: Nurse Practitioner

## 2016-06-28 DIAGNOSIS — E11621 Type 2 diabetes mellitus with foot ulcer: Secondary | ICD-10-CM | POA: Diagnosis not present

## 2016-06-29 NOTE — Progress Notes (Signed)
Gene Brown, Brant V. ([id number]) Visit Report for 06/28/2016 Arrival Information Details Patient Name: Gene Brown, Gene V. Date of Service: 06/28/2016 8:15 AM Medical Record Number: [id number] Patient Account Number: [id number] Date of Birth/Sex: 03/31/1951 (65 y.o. Male) Treating RN: Clover MealyAfful, RN, BSN, [place] Primary Care Physician: Barbette ReichmannHande, Vishwanath Other Clinician: Referring Physician: Barbette ReichmannHande, Vishwanath Treating Physician/Extender: Kathreen Cosieroulter, Leah Weeks in Treatment: 5 Visit Information History Since Last Visit All ordered tests and consults were completed: No Patient Arrived: Ambulatory Added or deleted any medications: No Arrival Time: 08:15 Any new allergies or adverse reactions: No Accompanied By: self Had a fall or experienced change in No Transfer Assistance: None activities of daily living that may affect Patient Identification Verified: Yes risk of falls: Secondary Verification Process Yes Signs or symptoms of abuse/neglect since last No Completed: visito Patient Requires Transmission- No Hospitalized since last visit: No Based Precautions: Has Dressing in Place as Prescribed: Yes Patient Has Alerts: Yes Pain Present Now: No Patient Alerts: Patient on Blood Thinner Aspirin 325mg  Type II Diabetic 9/17 A1c 10 ABI non- compressible (L) Electronic Signature(s) Signed: 06/28/2016 4:22:45 PM By: Elpidio EricAfful, Rita BSN, RN Entered By: Elpidio EricAfful, Rita on 06/28/2016 08:16:34 Gene Brown, Gene V. ([id number]) -------------------------------------------------------------------------------- Clinic Level of Care Assessment Details Patient Name: Gene Brown, Gene V. Date of Service: 06/28/2016 8:15 AM Medical Record Number: [id number] Patient Account Number: [id number] Date of Birth/Sex: 10/14/1950 2(65 y.o. Male) Treating RN: Clover MealyAfful, RN, BSN, Rita Primary Care Physician: Barbette ReichmannHande, Vishwanath Other Clinician: Referring Physician: Barbette ReichmannHande, Vishwanath Treating Physician/Extender: Kathreen Cosieroulter, Leah Weeks in  Treatment: 5 Clinic Level of Care Assessment Items TOOL 4 Quantity Score []  - Use when only an EandM is performed on FOLLOW-UP visit 0 ASSESSMENTS - Nursing Assessment / Reassessment X - Reassessment of Co-morbidities (includes updates in patient status) 1 10 X - Reassessment of Adherence to Treatment Plan 1 5 ASSESSMENTS - Wound and Skin Assessment / Reassessment []  - Simple Wound Assessment / Reassessment - one wound 0 []  - Complex Wound Assessment / Reassessment - multiple wounds 0 []  - Dermatologic / Skin Assessment (not related to wound area) 0 ASSESSMENTS - Focused Assessment []  - Circumferential Edema Measurements - multi extremities 0 []  - Nutritional Assessment / Counseling / Intervention 0 []  - Lower Extremity Assessment (monofilament, tuning fork, pulses) 0 []  - Peripheral Arterial Disease Assessment (using hand held doppler) 0 ASSESSMENTS - Ostomy and/or Continence Assessment and Care []  - Incontinence Assessment and Management 0 []  - Ostomy Care Assessment and Management (repouching, etc.) 0 PROCESS - Coordination of Care X - Simple Patient / Family Education for ongoing care 1 15 []  - Complex (extensive) Patient / Family Education for ongoing care 0 []  - Staff obtains ChiropractorConsents, Records, Test Results / Process Orders 0 []  - Staff telephones HHA, Nursing Homes / Clarify orders / etc 0 []  - Routine Transfer to another Facility (non-emergent condition) 0 Gene Brown, Gene V. ([id number]) []  - Routine Hospital Admission (non-emergent condition) 0 []  - New Admissions / Manufacturing engineernsurance Authorizations / Ordering NPWT, Apligraf, etc. 0 []  - Emergency Hospital Admission (emergent condition) 0 []  - Simple Discharge Coordination 0 []  - Complex (extensive) Discharge Coordination 0 PROCESS - Special Needs []  - Pediatric / Minor Patient Management 0 []  - Isolation Patient Management 0 []  - Hearing / Language / Visual special needs 0 []  - Assessment of Community assistance (transportation, D/C  planning, etc.) 0 []  - Additional assistance / Altered mentation 0 []  - Support Surface(s) Assessment (bed, cushion, seat, etc.) 0 INTERVENTIONS - Wound Cleansing / Measurement []  - Simple  Wound Cleansing - one wound 0 []  - Complex Wound Cleansing - multiple wounds 0 []  - Wound Imaging (photographs - any number of wounds) 0 []  - Wound Tracing (instead of photographs) 0 []  - Simple Wound Measurement - one wound 0 []  - Complex Wound Measurement - multiple wounds 0 INTERVENTIONS - Wound Dressings []  - Small Wound Dressing one or multiple wounds 0 []  - Medium Wound Dressing one or multiple wounds 0 []  - Large Wound Dressing one or multiple wounds 0 []  - Application of Medications - topical 0 []  - Application of Medications - injection 0 INTERVENTIONS - Miscellaneous []  - External ear exam 0 Gene Brown, Gene Brown ([id number]) []  - Specimen Collection (cultures, biopsies, blood, body fluids, etc.) 0 []  - Specimen(s) / Culture(s) sent or taken to Lab for analysis 0 []  - Patient Transfer (multiple staff / Michiel Sites Lift / Similar devices) 0 []  - Simple Staple / Suture removal (25 or less) 0 []  - Complex Staple / Suture removal (26 or more) 0 []  - Hypo / Hyperglycemic Management (close monitor of Blood Glucose) 0 []  - Ankle / Brachial Index (ABI) - do not check if billed separately 0 X - Vital Signs 1 5 Has the patient been seen at the hospital within the last three years: Yes Total Score: 35 Level Of Care: New/Established - Level 1 Electronic Signature(s) Signed: 06/28/2016 4:22:45 PM By: Elpidio Eric BSN, RN Entered By: Elpidio Eric on 06/28/2016 08:23:04 Gene Bang ([id number]) -------------------------------------------------------------------------------- Encounter Discharge Information Details Patient Name: Gene Bang Date of Service: 06/28/2016 8:15 AM Medical Record Number: [id number] Patient Account Number: [id number] Date of Birth/Sex: [birth year] (65 y.o. Male) Treating RN:  Clover Mealy, RN, BSN, [place] Sink Primary Care Physician: Barbette Reichmann Other Clinician: Referring Physician: Barbette Reichmann Treating Physician/Extender: Kathreen Cosier in Treatment: 5 Encounter Discharge Information Items Discharge Pain Level: 0 Discharge Condition: Stable Ambulatory Status: Ambulatory Discharge Destination: Home Transportation: Private Auto Accompanied By: self Schedule Follow-up Appointment: No Medication Reconciliation completed and provided to Patient/Care No Shaneece Stockburger: Provided on Clinical Summary of Care: 06/28/2016 Form Type Recipient Paper Patient PG Electronic Signature(s) Signed: 06/28/2016 8:26:21 AM By: Elpidio Eric BSN, RN Previous Signature: 06/28/2016 8:24:41 AM Version By: Gwenlyn Perking Entered By: Elpidio Eric on 06/28/2016 08:26:21 Gene Bang ([id number]) -------------------------------------------------------------------------------- Lower Extremity Assessment Details Patient Name: Gene Bang Date of Service: 06/28/2016 8:15 AM Medical Record Number: [id number] Patient Account Number: [id number] Date of Birth/Sex: 1950-09-12 (65 y.o. Male) Treating RN: Clover Mealy, RN, BSN, [place] Sink Primary Care Physician: Barbette Reichmann Other Clinician: Referring Physician: Barbette Reichmann Treating Physician/Extender: Kathreen Cosier in Treatment: 5 Electronic Signature(s) Signed: 06/28/2016 8:25:48 AM By: Elpidio Eric BSN, RN Entered By: Elpidio Eric on 06/28/2016 08:25:48 Gene Bang ([id number]) -------------------------------------------------------------------------------- Multi-Disciplinary Care Plan Details Patient Name: Gene Bang Date of Service: 06/28/2016 8:15 AM Medical Record Number: [id number] Patient Account Number: [id number] Date of Birth/Sex: 15-Jun-1951 (65 y.o. Male) Treating RN: Clover Mealy, RN, BSN, [place] Sink Primary Care Physician: Barbette Reichmann Other Clinician: Referring Physician: Barbette Reichmann Treating  Physician/Extender: Kathreen Cosier in Treatment: 5 Active Inactive Electronic Signature(s) Signed: 06/28/2016 4:22:45 PM By: Elpidio Eric BSN, RN Entered By: Elpidio Eric on 06/28/2016 08:21:49 Gene Bang ([id number]) -------------------------------------------------------------------------------- Pain Assessment Details Patient Name: Gene Bang Date of Service: 06/28/2016 8:15 AM Medical Record Number: [id number] Patient Account Number: [id number] Date of Birth/Sex: [birth year] (65 y.o. Male) Treating RN: Clover Mealy, RN, BSN, [place] Sink Primary Care Physician: Barbette Reichmann Other Clinician: Referring Physician: Barbette Reichmann Treating Physician/Extender: Bonnell Public  Weeks in Treatment: 5 Active Problems Location of Pain Severity and Description of Pain Patient Has Paino No Site Locations With Dressing Change: No Pain Management and Medication Current Pain Management: Electronic Signature(s) Signed: 06/28/2016 4:22:45 PM By: Elpidio Eric BSN, RN Entered By: Elpidio Eric on 06/28/2016 08:16:43 Gene Bang ([id number]) -------------------------------------------------------------------------------- Patient/Caregiver Education Details Patient Name: Gene Bang Date of Service: 06/28/2016 8:15 AM Medical Record Number: [id number] Patient Account Number: [id number] Date of Birth/Gender: 23-Mar-1951 (65 y.o. Male) Treating RN: Clover Mealy, RN, BSN, [place] Sink Primary Care Physician: Barbette Reichmann Other Clinician: Referring Physician: Barbette Reichmann Treating Physician/Extender: Kathreen Cosier in Treatment: 5 Education Assessment Education Provided To: Patient Education Topics Provided Wound/Skin Impairment: Methods: Explain/Verbal Responses: State content correctly Electronic Signature(s) Signed: 06/28/2016 4:22:45 PM By: Elpidio Eric BSN, RN Entered By: Elpidio Eric on 06/28/2016 08:26:39 Gene Bang  ([id number]) -------------------------------------------------------------------------------- Vitals Details Patient Name: Gene Bang Date of Service: 06/28/2016 8:15 AM Medical Record Number: [id number] Patient Account Number: [id number] Date of Birth/Sex: 04/28/1951 (65 y.o. Male) Treating RN: Clover Mealy, RN, BSN, Rita Primary Care Physician: Barbette Reichmann Other Clinician: Referring Physician: Barbette Reichmann Treating Physician/Extender: Kathreen Cosier in Treatment: 5 Vital Signs Time Taken: 08:16 Temperature (F): 97.5 Height (in): 68 Pulse (bpm): 86 Weight (lbs): 245 Respiratory Rate (breaths/min): 17 Body Mass Index (BMI): 37.2 Blood Pressure (mmHg): 151/78 Reference Range: 80 - 120 mg / dl Electronic Signature(s) Signed: 06/28/2016 4:22:45 PM By: Elpidio Eric BSN, RN Entered By: Elpidio Eric on 06/28/2016 08:18:36

## 2016-07-05 NOTE — Progress Notes (Signed)
Gene Brown, Gene V. ([id number]) Visit Report for 06/28/2016 Chief Complaint Document Details Patient Name: Gene Brown, Gene V. Date of Service: 06/28/2016 8:15 AM Medical Record Number: [id number] Patient Account Number: [id number] Date of Birth/Sex: 04/02/1951 (65 y.o. Male) Treating RN: Clover MealyAfful, RN, BSN, [place] Sinkita Primary Care Physician: Barbette ReichmannHande, Vishwanath Other Clinician: Referring Physician: Barbette ReichmannHande, Vishwanath Treating Physician/Extender: Kathreen Cosieroulter, Ilithyia Titzer Weeks in Treatment: 5 Information Obtained from: Patient Chief Complaint patient presents for follow-up of healed ulceration to review MRI Electronic Signature(s) Signed: 06/28/2016 8:24:58 AM By: Penne Lashoulter, NP, Loki Wuthrich Entered By: Penne Lashoulter, NP, Quintessa Simmerman on 06/28/2016 08:24:58 Gene Brown, Gene V. ([id number]) -------------------------------------------------------------------------------- HPI Details Patient Name: Gene Brown, Gene V. Date of Service: 06/28/2016 8:15 AM Medical Record Number: [id number] Patient Account Number: [id number] Date of Birth/Sex: 07/17/1951 (65 y.o. Male) Treating RN: Clover MealyAfful, RN, BSN, [place] Sinkita Primary Care Physician: Barbette ReichmannHande, Vishwanath Other Clinician: Referring Physician: Barbette ReichmannHande, Vishwanath Treating Physician/Extender: Kathreen Cosieroulter, Kadan Millstein Weeks in Treatment: 5 History of Present Illness HPI Description: 05/24/16; this is a 65 year old type II diabetic who probably has some degree of polyneuropathy. He tells me that he has had an area on the plantar aspect of his left great toe that is been followed by podiatry for most the last year. He has a lot of callus, the podiatrist which shave this down put a dressing on and he would follow pad monthly or sometimes longer intervals. He has not noted any drainage or pain. He has not had a recent x-ray. No serious attempt at offloading this with modified footwear.. Last hemoglobin A1c that he knows about was over 10 a month ago he is working hard on this with his primary physician. More recently he has been  soaking this with peroxide ABIs are noncompressible and the left leg. He was told by his podiatrist he has a bone spur underneath the wound but did not wish any surgical procedures. He has a caregiver at home for a very disabled wife, the nature of her illness is not certain 05/31/16 patient comes in much the same as last week amounts of callus nonviable subcutaneous tissue which required debridement. His x-ray suggested cortical bone irregularity. He will need an MRI. He tells me he has old screws/hardware left knee however he thinks he has had a subsequent MRI after this 06/07/16 at this point in time patient has been attempting to offload his wound as much as he could on his own. He has made a conscious effort to avoid placing any pressure on the toe as he was moving around and walking and I do believe this has payed off and what I am seeing at this point in time. overall the wound appears to be somewhat improved in my opinion he is not having any significant discomfort at this point in time. In fact he tells me he has no pain at all. There is no interval sign of surrounding infection in the wound itself has actually decreased in size. Unfortunately the MRI was not ordered last week and therefore we do need to proceed with the MRI order today. again this is to evaluate the cortical bone irregularity noted on x-ray. 06/14/16 Patient has continued to use the offloading shoe at this point in time. He tells me this is somewhat difficult to get used to walking and but nonetheless he is very strict about wearing it. Again I believe this has paid off very well for him and his wound appears to be greatly improved. He does have some callused area surrounding the wound proximally that has caused some fluid collection  of maceration of the callused area. This likely will require debridement today. Some adherent slough noted over the wound bed. 06/21/16; patient's wound on the plantar aspect of his left  great toe is totally epithelialized. His MRI of the foot is tomorrow to follow-up on the cortical irregularity noted on plain x-ray. He does not have diabetic foot where 06-28-16-He comes in today with a continued healed left great toe. MRI performed on 06/23/2016 shows no abscess osteomyelitis or septic joint. Patient was discharged from wound care center today. Electronic Signature(s) Signed: 06/28/2016 8:26:21 AM By: Penne Lash, NP, Reggie Pile By: Penne Lash, NP, Quinn Quam on 06/28/2016 40:98:11 Gene Brown ([id number]) -------------------------------------------------------------------------------- Physical Exam Details Patient Name: Gene Brown Date of Service: 06/28/2016 8:15 AM Medical Record Number: [id number] Patient Account Number: [id number] Date of Birth/Sex: 1951/04/13 (65 y.o. Male) Treating RN: Clover Mealy, RN, BSN, [place] Sink Primary Care Physician: Barbette Reichmann Other Clinician: Referring Physician: Barbette Reichmann Treating Physician/Extender: Kathreen Cosier in Treatment: 5 Constitutional afebrile. well nourished; well developed; appears stated age;Marland Kitchen Respiratory non-labored respiratory effort. Integumentary (Hair, Skin) continues to have healed left great toe. Psychiatric appears to make sound judgement and have accurate insight regarding healthcare. oriented to time, place, person and situation. calm, pleasant, conversive. Notes patient discharged from wound care center today Electronic Signature(s) Signed: 06/28/2016 8:27:09 AM By: Penne Lash, NP, Reggie Pile By: Penne Lash, NP, Kaina Orengo on 06/28/2016 08:27:09 Gene Brown ([id number]) -------------------------------------------------------------------------------- Physician Orders Details Patient Name: Gene Brown Date of Service: 06/28/2016 8:15 AM Medical Record Number: [id number] Patient Account Number: [id number] Date of Birth/Sex: 23-Jun-1951 (65 y.o. Male) Treating RN: Clover Mealy, RN, BSN, [place] Sink Primary Care  Physician: Barbette Reichmann Other Clinician: Referring Physician: Barbette Reichmann Treating Physician/Extender: Kathreen Cosier in Treatment: 5 Verbal / Phone Orders: Yes Clinician: Afful, RN, BSN, Rita Read Back and Verified: Yes Diagnosis Coding Discharge From [facility] Services o Discharge from Wound Care Center - Treatment Completed. MRI negative for Osteomyelitis Electronic Signature(s) Signed: 06/28/2016 4:22:45 PM By: Elpidio Eric BSN, RN Signed: 07/05/2016 9:25:32 AM By: Penne Lash, NP, Tiffanni Scarfo Entered By: Elpidio Eric on 06/28/2016 08:22:35 Gene Brown ([id number]) -------------------------------------------------------------------------------- Problem List Details Patient Name: Gene Brown Date of Service: 06/28/2016 8:15 AM Medical Record Number: [id number] Patient Account Number: [id number] Date of Birth/Sex: [birth year] (65 y.o. 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 Male) Treating RN: Clover Mealy, RN, BSN, [place] Sink Primary Care  Physician: Barbette Reichmann Other Clinician: Referring Physician: Barbette Reichmann Treating Physician/Extender: Kathreen Cosier in Treatment: 5 Subjective Chief Complaint Information obtained from Patient patient presents for follow-up of healed ulceration to review MRI History of Present Illness (HPI) 05/24/16; this is a 65 year old type II diabetic who probably has some degree of polyneuropathy. He tells me that he has had an area on the plantar aspect of his left great toe that  is been followed by podiatry for most the last year. He has a lot of callus, the podiatrist which shave this down put a dressing on and he would follow pad monthly or sometimes longer intervals. He has not noted any drainage or pain. He has not had a recent x-ray. No serious attempt at offloading this with modified footwear.. Last hemoglobin A1c that he knows about was over 10 a month ago he is working hard on this with his primary physician. More recently he has been soaking this with peroxide ABIs are noncompressible and the left leg. He was told by his podiatrist he has a bone spur underneath the wound but did not wish any surgical procedures. He has a caregiver at home for a very disabled wife, the nature of her illness is not certain 05/31/16 patient comes in much the same as last week amounts of callus nonviable subcutaneous tissue which required debridement. His x-ray suggested cortical bone irregularity. He will need an MRI. He tells me he has old screws/hardware left knee however he thinks he has had a subsequent MRI after this 06/07/16 at this point in time patient has been attempting to offload his wound as much as he could on his own. He has made a conscious effort to avoid placing any pressure on the toe as he was moving around and walking and I do believe this has payed off and what I am seeing at this point in time. overall the wound appears to be somewhat improved in my opinion he is not  having any significant discomfort at this point in time. In fact he tells me he has no pain at all. There is no interval sign of surrounding infection in the wound itself has actually decreased in size. Unfortunately the MRI was not ordered last week and therefore we do need to proceed with the MRI order today. again this is to evaluate the cortical bone irregularity noted on x-ray. 06/14/16 Patient has continued to use the offloading shoe at this point in time. He tells me this is somewhat difficult to get used to walking and but nonetheless he is very strict about wearing it. Again I believe this has paid off very well for him and his wound appears to be greatly improved. He does have some callused area surrounding the wound proximally that has caused some fluid collection of maceration of the callused area. This likely will require debridement today. Some adherent slough noted over the wound bed. 06/21/16; patient's wound on the plantar aspect of his left great toe is totally epithelialized. His MRI of the foot is tomorrow to follow-up on the cortical irregularity noted on plain x-ray. He does not have diabetic foot where 06-28-16-He comes in today with a continued healed left great toe. MRI performed on 06/23/2016 shows no abscess osteomyelitis or septic joint. Patient was discharged from wound care center today. BRENNIN, DURFEE ([id number]) Objective Constitutional afebrile. well nourished; well developed; appears stated age;Marland Kitchen Vitals Time Taken: 8:16 AM, Height: 68 in, Weight: 245 lbs, BMI: 37.2, Temperature: 97.5 F, Pulse: 86 bpm, Respiratory Rate: 17 breaths/min, Blood Pressure: 151/78 mmHg. Respiratory non-labored respiratory effort. Psychiatric appears to make sound judgement and have accurate insight regarding healthcare. oriented to time, place, person and situation. calm, pleasant, conversive. General Notes: patient discharged from wound care center today Integumentary (Hair,  Skin) continues to have healed left great toe. 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 MRI negative for Osteomyelitis Gene Brown, Gene V. ([id number]) Follow-Up Appointments: A [facility] Clinical [facility] He was encouraged to contact the clinic with any erythema or drainage swelling or any general concerns for his recently healed wound or any other wounds are present themselves Electronic Signature(s) Signed: 06/28/2016 8:36:01 AM By: Penne Lashoulter, NP, Viyan Rosamond Previous Signature: 06/28/2016 8:27:15 AM Version By: Penne Lashoulter, NP, Reggie PileLeah Entered By: Penne Lashoulter, NP, Janesha Brissette on 06/28/2016 08:36:01 Gene Brown, Gene V. ([id number]) -------------------------------------------------------------------------------- SuperBill Details Patient Name: Gene Brown, Demetrious V. Date of Service: 06/28/2016 Medical Record Number: [id number] Patient Account Number: [id number] Date of Birth/Sex: 12/12/1950 [age] y.o. Male) Treating RN: Clover MealyAfful, RN, BSN, [place] Sinkita Primary Care Physician: Barbette ReichmannHande, Vishwanath Other Clinician: Referring Physician: Barbette ReichmannHande, Vishwanath Treating Physician/Extender: Kathreen Cosieroulter, Ezelle Surprenant Weeks in Treatment: 5 Diagnosis Coding ICD-10 Codes Code Description E11.621 Type 2 diabetes mellitus with foot ulcer L97.523 Non-pressure chronic ulcer of other part of left foot with necrosis of muscle E11.42 Type 2 diabetes mellitus with diabetic polyneuropathy Facility Procedures CPT4 Code: [id number] Description: [id number] - WOUND CARE VISIT-LEV 1 EST PT Modifier: Quantity: 1 Physician Procedures CPT4 Code: [id number] Description: 99213 - WC PHYS LEVEL 3 - EST PT ICD-10 Description Diagnosis E11.621 Type 2 diabetes mellitus with  foot ulcer Modifier: Quantity: 1 Electronic Signature(s) Signed: 06/28/2016 8:27:38 AM By: Penne Lashoulter, NP, Shenell Rogalski Entered By: Penne Lashoulter, NP, Meris Reede on 06/28/2016 08:27:38

## 2016-12-01 ENCOUNTER — Encounter: Payer: Self-pay | Admitting: Urology

## 2016-12-01 ENCOUNTER — Other Ambulatory Visit: Payer: Self-pay

## 2016-12-01 ENCOUNTER — Other Ambulatory Visit: Payer: Medicare Other

## 2016-12-01 DIAGNOSIS — R972 Elevated prostate specific antigen [PSA]: Secondary | ICD-10-CM

## 2016-12-05 ENCOUNTER — Encounter: Payer: Self-pay | Admitting: Urology

## 2016-12-05 ENCOUNTER — Ambulatory Visit: Payer: Medicare Other | Admitting: Urology

## 2017-01-18 ENCOUNTER — Other Ambulatory Visit: Payer: Medicare Other

## 2017-01-18 DIAGNOSIS — R972 Elevated prostate specific antigen [PSA]: Secondary | ICD-10-CM

## 2017-01-19 LAB — PSA: Prostate Specific Ag, Serum: 1.7 ng/mL (ref 0.0–4.0)

## 2017-01-24 ENCOUNTER — Telehealth: Payer: Self-pay

## 2017-01-24 DIAGNOSIS — R351 Nocturia: Secondary | ICD-10-CM | POA: Insufficient documentation

## 2017-01-24 DIAGNOSIS — R972 Elevated prostate specific antigen [PSA]: Secondary | ICD-10-CM | POA: Insufficient documentation

## 2017-01-24 DIAGNOSIS — N5201 Erectile dysfunction due to arterial insufficiency: Secondary | ICD-10-CM | POA: Insufficient documentation

## 2017-01-24 NOTE — Telephone Encounter (Signed)
Crist FatHerrick, Benjamin W, MD  Ellin GoodieLowe, Diamonte Stavely S, CMA        Please inform the patient that his PSA has improved significantly - which is really good news.        Called pt. No answer.

## 2017-01-24 NOTE — Progress Notes (Signed)
01/25/2017 1:30 PM   Gene Brown Gene Brown 02/03/1951 [id number]  Referring provider: Barbette ReichmannHande, Vishwanath, MD [address] [facility] [place], [place] [address]  CC: Follow up GU problems  HPI:  1. Elevated PSA -   06/2015 - PSA 5.5 ==> NEGATIVE biopsy 12/2015 - PSA 2.8 (finasteride) 01/2017 - PSA 1.8 (finasteride) / DRE 60gm smooth   2. Nocturia / Lower Urinary Tract Symptoms - on tamsulsoin + finasteride since 2016 for mix of obstructive and irritative symptoms with moderate control. DRE 60gm. He is diabetic with LE neuropathy.  3. Erectile dysfunction due to arterial insufficiency - on various PDE5i in past for age appropriate erection problems, presently no therapy due to wife illness.   PMH sig for obesity / gastric bypass, abd hernia repair with mesh, orthos surgery x seeral. IDDM2 with neuropathy. No ischemic CV disease.  Today "Gene Brown" is seen in f/u above and PSA check which continues to fall as expected on finasteride. NO interval hematuria or retention. PVR today "187mL". He does note some small progression of LUTS, but still not to point of wanting surgery.    Past Medical History:  Diagnosis Date  . Arthritis 06/25/2015  . BP (high blood pressure) 06/25/2015  . H/O varicella 06/25/2015  . H/O: gout 06/25/2015  . Type 2 diabetes mellitus (HCC) 06/25/2015    Surgical History: Past Surgical History:  Procedure Laterality Date  . ANTERIOR CRUCIATE LIGAMENT REPAIR  1982   2004-2005  . GASTRIC BYPASS  2006  . INGUINAL HERNIA REPAIR    . MENISCUS REPAIR  2014  . TONSILLECTOMY AND ADENOIDECTOMY    . VASECTOMY      Home Medications:  Allergies as of 01/25/2017   No Known Allergies     Medication List       Accurate as of 01/24/17  1:30 PM. Always use your most recent med list.          co-enzyme Q-10 30 MG capsule Take 30 mg by mouth 3 (three) times daily.   finasteride 5 MG tablet Commonly known as:  PROSCAR Take 1 tablet (5 mg total) by  mouth daily.   glipiZIDE 5 MG tablet Commonly known as:  GLUCOTROL Take 5 mg by mouth 2 (two) times daily before a meal.   LANTUS 100 UNIT/ML injection Generic drug:  insulin glargine ADM 20 UNITS [place] QD   lisinopril 40 MG tablet Commonly known as:  PRINIVIL,ZESTRIL   magnesium 30 MG tablet Take 30 mg by mouth 2 (two) times daily.   metFORMIN 1000 MG tablet Commonly known as:  GLUCOPHAGE TAKE 1 TABLET (1,000 MG TOTAL) BY MOUTH 2 (TWO) TIMES DAILY WITH MEALS.   RA VITAMIN B-12 TR 1000 MCG Tbcr Generic drug:  Cyanocobalamin Take by mouth.   sildenafil 20 MG tablet Commonly known as:  REVATIO Take 1 tablet (20 mg total) by mouth as needed (2-5 tablets daily as needed). Take 2- 5 tabs PO daily prn   VITAMIN D-1000 MAX ST 1000 units tablet Generic drug:  Cholecalciferol Take by mouth.   zinc gluconate 50 MG tablet Take 50 mg by mouth daily.       Allergies: No Known Allergies  Family History: Family History  Problem Relation Age of Onset  . Prostate cancer Father   . Hematuria Neg Hx   . Renal cancer Neg Hx     Social History:  reports that he has never smoked. He has never used smokeless tobacco. He reports that he does not  drink alcohol or use drugs.   Review of Systems  Gastrointestinal (upper)  : Negative for upper GI symptoms  Gastrointestinal (lower) : Negative for lower GI symptoms  Constitutional : Negative for symptoms  Skin: Negative for skin symptoms  Eyes: Negative for eye symptoms  Ear/Nose/Throat : Negative for Ear/Nose/Throat symptoms  Hematologic/Lymphatic: Negative for Hematologic/Lymphatic symptoms  Cardiovascular : Negative for cardiovascular symptoms  Respiratory : Negative for respiratory symptoms  Endocrine: Negative for endocrine symptoms  Musculoskeletal: Negative for musculoskeletal symptoms  Neurological: Negative for neurological symptoms  Psychologic: Negative for psychiatric symptoms   Physical  Exam: There were no vitals taken for this visit.  Constitutional:  Alert and oriented, No acute distress. HEENT: [place] AT, moist mucus membranes.  Trachea midline, no masses. Cardiovascular: No clubbing, cyanosis, or edema. Respiratory: Normal respiratory effort, no increased work of breathing. GI: Abdomen is soft, nontender, nondistended, no abdominal masses GU: No CVA tenderness. DRE 60gm smooth. UNcircumcised phallus with retractile foreskin.  Skin: No rashes, bruises or suspicious lesions. Lymph: No cervical or inguinal adenopathy. Neurologic: Grossly intact, no focal deficits, moving all 4 extremities. Psychiatric: Normal mood and affect.  Laboratory Data: No results found for: WBC, HGB, HCT, MCV, PLT  Lab Results  Component Value Date   CREATININE 0.90 06/23/2016    No results found for: PSA  No results found for: TESTOSTERONE  No results found for: HGBA1C  Urinalysis No results found for: COLORURINE, APPEARANCEUR, LABSPEC, PHURINE, GLUCOSEU, HGBUR, BILIRUBINUR, KETONESUR, PROTEINUR, UROBILINOGEN, NITRITE, LEUKOCYTESUR    Assessment & Plan:   1. Elevated PSA - now stabilized on finasteride, transition to yearly screenign only until age 23-75.   2. Nocturia - well controlled on tamsulosin + finasteride, continue.   3. Erectile dysfunction due to arterial insufficiency - he is aware of further medical and surgical options should he want to persue again in future.  RTC 1 year with PSA, PVR.  Sebastian Ache, MD  [facility] [address] [place], [place] [address] [phone]

## 2017-01-24 NOTE — Telephone Encounter (Deleted)
Called patient to give lab results. No answer. 

## 2017-01-25 ENCOUNTER — Ambulatory Visit (INDEPENDENT_AMBULATORY_CARE_PROVIDER_SITE_OTHER): Payer: Medicare Other | Admitting: Urology

## 2017-01-25 ENCOUNTER — Encounter: Payer: Self-pay | Admitting: Urology

## 2017-01-25 VITALS — BP 128/70 | HR 71 | Ht 68.0 in | Wt 248.0 lb

## 2017-01-25 DIAGNOSIS — R972 Elevated prostate specific antigen [PSA]: Secondary | ICD-10-CM | POA: Diagnosis not present

## 2017-01-25 DIAGNOSIS — N5201 Erectile dysfunction due to arterial insufficiency: Secondary | ICD-10-CM | POA: Diagnosis not present

## 2017-01-25 DIAGNOSIS — R351 Nocturia: Secondary | ICD-10-CM

## 2017-01-25 LAB — BLADDER SCAN AMB NON-IMAGING: SCAN RESULT: 187

## 2017-01-25 MED ORDER — FINASTERIDE 5 MG PO TABS
5.0000 mg | ORAL_TABLET | Freq: Every day | ORAL | 3 refills | Status: DC
Start: 2017-01-25 — End: 2017-02-15

## 2017-01-25 MED ORDER — TAMSULOSIN HCL 0.4 MG PO CAPS: 0.4000 mg | ORAL_CAPSULE | Freq: Every day | ORAL | 3 refills | Status: DC

## 2017-01-25 NOTE — Telephone Encounter (Signed)
Called patient.  No answer.  Will send letter.

## 2017-01-26 NOTE — Telephone Encounter (Signed)
Gene Brown, Benjamin W, MD  Ellin GoodieLowe, Casandra S, CMA        Please inform the patient that his PSA has improved significantly - which is really good news.      This encounter was created in error - please disregard.

## 2017-02-15 ENCOUNTER — Other Ambulatory Visit: Payer: Self-pay

## 2017-02-15 DIAGNOSIS — R351 Nocturia: Secondary | ICD-10-CM

## 2017-02-15 DIAGNOSIS — R972 Elevated prostate specific antigen [PSA]: Secondary | ICD-10-CM

## 2017-02-15 MED ORDER — FINASTERIDE 5 MG PO TABS: 5.0000 mg | ORAL_TABLET | Freq: Every day | ORAL | 3 refills | Status: DC

## 2017-02-15 NOTE — Telephone Encounter (Signed)
Called pt to inform recvd [facility] No answer. Sent Rx.

## 2017-03-20 ENCOUNTER — Telehealth: Payer: Self-pay | Admitting: Urology

## 2017-03-20 DIAGNOSIS — R351 Nocturia: Secondary | ICD-10-CM

## 2017-03-20 DIAGNOSIS — R972 Elevated prostate specific antigen [PSA]: Secondary | ICD-10-CM

## 2017-03-20 DIAGNOSIS — N529 Male erectile dysfunction, unspecified: Secondary | ICD-10-CM

## 2017-03-20 MED ORDER — FINASTERIDE 5 MG PO TABS: 5.0000 mg | ORAL_TABLET | Freq: Every day | ORAL | 3 refills | Status: AC

## 2017-03-20 MED ORDER — TAMSULOSIN HCL 0.4 MG PO CAPS: 0.4000 mg | ORAL_CAPSULE | Freq: Every day | ORAL | 3 refills | Status: AC

## 2017-03-20 MED ORDER — SILDENAFIL CITRATE 20 MG PO TABS: 20.0000 mg | ORAL_TABLET | ORAL | 5 refills | Status: DC | PRN

## 2017-03-20 NOTE — Telephone Encounter (Signed)
[facility]ills on tamsulosin and [facility]ide [facility], patient was notified he would need to fill Sildenafil at a local pharm, script [facility]

## 2017-03-20 NOTE — Telephone Encounter (Signed)
Patient called the office today requesting refills on the following medications: * sildenafil * tamsulosin * finasteride  He is requesting the prescriptions to be [facility] to [facility].

## 2017-06-06 ENCOUNTER — Other Ambulatory Visit: Payer: Self-pay | Admitting: Internal Medicine

## 2017-06-06 DIAGNOSIS — R1031 Right lower quadrant pain: Secondary | ICD-10-CM

## 2017-08-28 ENCOUNTER — Ambulatory Visit: Admission: RE | Admit: 2017-08-28 | Payer: Medicare Other | Source: Ambulatory Visit

## 2017-09-14 ENCOUNTER — Ambulatory Visit
Admission: RE | Admit: 2017-09-14 | Discharge: 2017-09-14 | Disposition: A | Discharge status: Home / Self Care | Payer: Medicare Other | Source: Ambulatory Visit | Attending: Internal Medicine | Admitting: Internal Medicine

## 2017-09-14 DIAGNOSIS — K429 Umbilical hernia without obstruction or gangrene: Secondary | ICD-10-CM | POA: Diagnosis not present

## 2017-09-14 DIAGNOSIS — K760 Fatty (change of) liver, not elsewhere classified: Secondary | ICD-10-CM | POA: Diagnosis not present

## 2017-09-14 DIAGNOSIS — R1031 Right lower quadrant pain: Secondary | ICD-10-CM

## 2017-09-14 LAB — POCT I-STAT CREATININE: Creatinine, Ser: 0.9 mg/dL (ref 0.61–1.24)

## 2017-09-14 MED ORDER — IOPAMIDOL (ISOVUE-300) INJECTION 61%
100.0000 mL | Freq: Once | INTRAVENOUS | Status: AC | PRN
  Administered 2017-09-14: 100 mL via INTRAVENOUS

## 2017-09-28 ENCOUNTER — Encounter
Admission: RE | Admit: 2017-09-28 | Discharge: 2017-09-28 | Disposition: A | Discharge status: Home / Self Care | Payer: Medicare Other | Source: Ambulatory Visit | Attending: Surgery | Admitting: Surgery

## 2017-09-28 ENCOUNTER — Other Ambulatory Visit: Payer: Self-pay

## 2017-09-28 ENCOUNTER — Inpatient Hospital Stay: Admission: RE | Admit: 2017-09-28 | Payer: Medicare Other | Source: Ambulatory Visit

## 2017-09-28 DIAGNOSIS — E119 Type 2 diabetes mellitus without complications: Secondary | ICD-10-CM | POA: Insufficient documentation

## 2017-09-28 DIAGNOSIS — Z01818 Encounter for other preprocedural examination: Secondary | ICD-10-CM | POA: Insufficient documentation

## 2017-09-28 DIAGNOSIS — Z7984 Long term (current) use of oral hypoglycemic drugs: Secondary | ICD-10-CM | POA: Insufficient documentation

## 2017-09-28 DIAGNOSIS — I10 Essential (primary) hypertension: Secondary | ICD-10-CM | POA: Insufficient documentation

## 2017-09-28 HISTORY — DX: Sickle-cell trait: D57.3

## 2017-09-28 NOTE — Patient Instructions (Signed)
Your procedure is scheduled on: Thursday, February 28  Report to THE SECOND FLOOR OF THE MEDICAL MALL  To find out your arrival time please call [phone] between 1PM - 3PM on Wednesday, February 27  Remember: Instructions that are not followed completely may result in serious  medical risk, up to and including death, or upon the discretion of your surgeon  and anesthesiologist your surgery may need to be rescheduled.     _X__ 1. Do not eat food after midnight the night before your procedure.                 No gum chewing or hard candies.                   You may drink clear liquids up to 2 hours before you are scheduled to                  arrive for your surgery- DO not drink clear                 liquids within 2 hours of the start of your surgery.                  Clear Liquids include:  water, apple juice without pulp, clear carbohydrate                 drink such as Clearfast of Gartorade, Black Coffee or Tea (Do not add                 anything to coffee or tea).  __X__2.  On the morning of surgery brush your teeth with toothpaste and water,                        you may rinse your mouth with mouthwash if you wish.                             Do not swallow any  toothpaste of mouthwash.     _X__ 3.  No Alcohol for 24 hours before or after surgery.   _X__ 4.  Do Not Smoke or use e-cigarettes For 24 Hours Prior to Your Surgery.                 Do not use any chewable tobacco products for at least 6 hours prior to                 surgery.  ____  5.  Bring all medications with you on the day of surgery if instructed.   ____  6.  Notify your doctor if there is any change in your medical condition      (cold, fever, infections).     Do not wear jewelry, make-up, hairpins, clips or nail polish. Do not wear lotions, powders, or perfumes. You may wear deodorant. Do not shave 48 hours prior to surgery. Men may shave face and neck. Do not bring  valuables to the hospital.    [facility] is not responsible for any belongings or valuables.  Contacts, dentures or bridgework may not be worn into surgery. Leave your suitcase in the car. After surgery it may be brought to your room. For patients admitted to the hospital, discharge time is determined by your treatment team.   Patients discharged the day of surgery will not be allowed to drive home.   Please  read over the following fact sheets that you were given:                           PREPARING FOR SURGERY     ____ Take these medicines the morning of surgery with A SIP OF WATER:    1. FINASTERIDE  2. FLOMAX  3.   4.  5.  6.  ____ Fleet Enema (as directed)   __X__ Use CHG Soap as directed. (INSTRUCTIONS PROVIDED)  ____ Use inhalers on the day of surgery  __X__ Stop metformin 2 days prior to surgery. LAST DOSE ON 10/02/17    __X__ Take 1/2 of usual insulin dose the night before surgery.                  No insulin the morning of surgery.   __X__ Stop ALL ASPIRIN PRODUCTS NOW!!               THIS INCLUDES EXCEDRIN / GOODYS POWDER / BC POWDER  _X___ Stop Anti-inflammatories NOW!!                THIS INCLUDES IBUPROFEN / MOTRIN / ADVIL / ALEVE / NAPROSYN   ____ Stop supplements until after surgery.    ____ Bring C-Pap to the hospital.   WEAR LOOSE FITTING PANTS FOR SURGERY  HAVE STOOL SOFTENERS AVAILABLE ONCE HOME.    CONTINUE TO TAKE: GLIPIZIDE AND LISINOPRIL BUT DO NOT TAKE          EITHER ON THE DAY OF SURGERY

## 2017-10-04 MED ORDER — CEFAZOLIN SODIUM-DEXTROSE 2-4 GM/100ML-% IV SOLN
2.0000 g | Freq: Once | INTRAVENOUS | Status: AC
  Administered 2017-10-05: 2 g via INTRAVENOUS

## 2017-10-05 ENCOUNTER — Ambulatory Visit
Admission: RE | Admit: 2017-10-05 | Discharge: 2017-10-05 | Disposition: A | Discharge status: Home / Self Care | Payer: Medicare Other | Source: Ambulatory Visit | Attending: Surgery | Admitting: Surgery

## 2017-10-05 ENCOUNTER — Ambulatory Visit: Payer: Medicare Other | Admitting: Anesthesiology

## 2017-10-05 ENCOUNTER — Encounter: Payer: Self-pay | Admitting: *Deleted

## 2017-10-05 ENCOUNTER — Encounter
Admission: RE | Disposition: A | Discharge status: Home / Self Care | Payer: Self-pay | Source: Ambulatory Visit | Attending: Surgery

## 2017-10-05 DIAGNOSIS — I10 Essential (primary) hypertension: Secondary | ICD-10-CM | POA: Insufficient documentation

## 2017-10-05 DIAGNOSIS — D573 Sickle-cell trait: Secondary | ICD-10-CM | POA: Insufficient documentation

## 2017-10-05 DIAGNOSIS — M1990 Unspecified osteoarthritis, unspecified site: Secondary | ICD-10-CM | POA: Diagnosis not present

## 2017-10-05 DIAGNOSIS — K429 Umbilical hernia without obstruction or gangrene: Secondary | ICD-10-CM | POA: Diagnosis not present

## 2017-10-05 DIAGNOSIS — E119 Type 2 diabetes mellitus without complications: Secondary | ICD-10-CM | POA: Insufficient documentation

## 2017-10-05 HISTORY — PX: UMBILICAL HERNIA REPAIR: SHX196

## 2017-10-05 LAB — GLUCOSE, CAPILLARY
GLUCOSE-CAPILLARY: 268 mg/dL — AB (ref 65–99)
Glucose-Capillary: 302 mg/dL — ABNORMAL HIGH (ref 65–99)
Glucose-Capillary: 285 mg/dL — ABNORMAL HIGH (ref 65–99)

## 2017-10-05 SURGERY — REPAIR, HERNIA, UMBILICAL, ADULT
Anesthesia: General | Wound class: Clean

## 2017-10-05 MED ORDER — BUPIVACAINE-EPINEPHRINE (PF) 0.5% -1:200000 IJ SOLN
INTRAMUSCULAR | Status: DC | PRN
  Administered 2017-10-05: 8 mL via PERINEURAL

## 2017-10-05 MED ORDER — LIDOCAINE HCL (PF) 2 % IJ SOLN
INTRAMUSCULAR | Status: AC
  Filled 2017-10-05: qty 10

## 2017-10-05 MED ORDER — SODIUM CHLORIDE 0.9 % IV SOLN
INTRAVENOUS | Status: DC
  Administered 2017-10-05: 06:00:00 via INTRAVENOUS

## 2017-10-05 MED ORDER — ONDANSETRON HCL 4 MG/2ML IJ SOLN
INTRAMUSCULAR | Status: AC
  Filled 2017-10-05: qty 2

## 2017-10-05 MED ORDER — GLYCOPYRROLATE 0.2 MG/ML IJ SOLN
INTRAMUSCULAR | Status: AC
  Filled 2017-10-05: qty 1

## 2017-10-05 MED ORDER — ACETAMINOPHEN 10 MG/ML IV SOLN
INTRAVENOUS | Status: AC
  Filled 2017-10-05: qty 100

## 2017-10-05 MED ORDER — SUGAMMADEX SODIUM 500 MG/5ML IV SOLN
INTRAVENOUS | Status: AC
  Filled 2017-10-05: qty 5

## 2017-10-05 MED ORDER — ONDANSETRON HCL 4 MG/2ML IJ SOLN
4.0000 mg | Freq: Once | INTRAMUSCULAR | Status: DC | PRN
Start: 2017-10-05 — End: 2017-10-05

## 2017-10-05 MED ORDER — FAMOTIDINE 20 MG PO TABS
20.0000 mg | ORAL_TABLET | Freq: Once | ORAL | Status: AC
  Administered 2017-10-05: 20 mg via ORAL

## 2017-10-05 MED ORDER — INSULIN ASPART 100 UNIT/ML ~~LOC~~ SOLN
4.0000 [IU] | Freq: Once | SUBCUTANEOUS | Status: AC
  Administered 2017-10-05: 4 [IU] via SUBCUTANEOUS

## 2017-10-05 MED ORDER — EPHEDRINE SULFATE 50 MG/ML IJ SOLN
INTRAMUSCULAR | Status: AC
  Filled 2017-10-05: qty 1

## 2017-10-05 MED ORDER — FENTANYL CITRATE (PF) 100 MCG/2ML IJ SOLN: 25.0000 ug | INTRAMUSCULAR | Status: DC | PRN

## 2017-10-05 MED ORDER — INSULIN ASPART 100 UNIT/ML ~~LOC~~ SOLN
SUBCUTANEOUS | Status: AC
  Filled 2017-10-05: qty 1

## 2017-10-05 MED ORDER — PROPOFOL 10 MG/ML IV BOLUS
INTRAVENOUS | Status: AC
  Filled 2017-10-05: qty 40

## 2017-10-05 MED ORDER — DEXAMETHASONE SODIUM PHOSPHATE 10 MG/ML IJ SOLN
INTRAMUSCULAR | Status: DC | PRN
  Administered 2017-10-05: 10 mg via INTRAVENOUS

## 2017-10-05 MED ORDER — PROPOFOL 10 MG/ML IV BOLUS
INTRAVENOUS | Status: DC | PRN
  Administered 2017-10-05: 180 mg via INTRAVENOUS
  Administered 2017-10-05: 20 mg via INTRAVENOUS

## 2017-10-05 MED ORDER — PHENYLEPHRINE HCL 10 MG/ML IJ SOLN
INTRAMUSCULAR | Status: AC
  Filled 2017-10-05: qty 1

## 2017-10-05 MED ORDER — LIDOCAINE HCL (CARDIAC) 20 MG/ML IV SOLN
INTRAVENOUS | Status: DC | PRN
  Administered 2017-10-05: 100 mg via INTRAVENOUS

## 2017-10-05 MED ORDER — FENTANYL CITRATE (PF) 100 MCG/2ML IJ SOLN
INTRAMUSCULAR | Status: DC | PRN
  Administered 2017-10-05: 50 ug via INTRAVENOUS

## 2017-10-05 MED ORDER — MIDAZOLAM HCL 2 MG/2ML IJ SOLN
INTRAMUSCULAR | Status: DC | PRN
  Administered 2017-10-05: 2 mg via INTRAVENOUS

## 2017-10-05 MED ORDER — HYDROCODONE-ACETAMINOPHEN 5-325 MG PO TABS: 1.0000 | ORAL_TABLET | Freq: Four times a day (QID) | ORAL | 0 refills | Status: DC | PRN

## 2017-10-05 MED ORDER — CEFAZOLIN SODIUM-DEXTROSE 2-4 GM/100ML-% IV SOLN
INTRAVENOUS | Status: AC
  Filled 2017-10-05: qty 100

## 2017-10-05 MED ORDER — ACETAMINOPHEN 10 MG/ML IV SOLN
INTRAVENOUS | Status: DC | PRN
  Administered 2017-10-05: 1000 mg via INTRAVENOUS

## 2017-10-05 MED ORDER — ONDANSETRON HCL 4 MG/2ML IJ SOLN
INTRAMUSCULAR | Status: DC | PRN
  Administered 2017-10-05: 4 mg via INTRAVENOUS

## 2017-10-05 MED ORDER — ROCURONIUM BROMIDE 100 MG/10ML IV SOLN
INTRAVENOUS | Status: DC | PRN
  Administered 2017-10-05: 35 mg via INTRAVENOUS

## 2017-10-05 MED ORDER — SUCCINYLCHOLINE CHLORIDE 20 MG/ML IJ SOLN
INTRAMUSCULAR | Status: DC | PRN
  Administered 2017-10-05: 100 mg via INTRAVENOUS

## 2017-10-05 MED ORDER — INSULIN ASPART 100 UNIT/ML ~~LOC~~ SOLN
6.0000 [IU] | Freq: Once | SUBCUTANEOUS | Status: AC
  Administered 2017-10-05: 6 [IU] via SUBCUTANEOUS

## 2017-10-05 MED ORDER — SUGAMMADEX SODIUM 500 MG/5ML IV SOLN
INTRAVENOUS | Status: DC | PRN
  Administered 2017-10-05: 500 mg via INTRAVENOUS

## 2017-10-05 MED ORDER — INSULIN ASPART 100 UNIT/ML ~~LOC~~ SOLN
SUBCUTANEOUS | Status: AC
  Administered 2017-10-05: 4 [IU] via SUBCUTANEOUS
  Filled 2017-10-05: qty 1

## 2017-10-05 MED ORDER — SUCCINYLCHOLINE CHLORIDE 20 MG/ML IJ SOLN
INTRAMUSCULAR | Status: AC
  Filled 2017-10-05: qty 1

## 2017-10-05 MED ORDER — ROCURONIUM BROMIDE 50 MG/5ML IV SOLN
INTRAVENOUS | Status: AC
  Filled 2017-10-05: qty 1

## 2017-10-05 MED ORDER — DEXAMETHASONE SODIUM PHOSPHATE 10 MG/ML IJ SOLN
INTRAMUSCULAR | Status: AC
Start: 2017-10-05 — End: ?
  Filled 2017-10-05: qty 1

## 2017-10-05 MED ORDER — FENTANYL CITRATE (PF) 100 MCG/2ML IJ SOLN
INTRAMUSCULAR | Status: AC
  Filled 2017-10-05: qty 2

## 2017-10-05 MED ORDER — BUPIVACAINE-EPINEPHRINE (PF) 0.5% -1:200000 IJ SOLN
INTRAMUSCULAR | Status: AC
  Filled 2017-10-05: qty 30

## 2017-10-05 MED ORDER — FAMOTIDINE 20 MG PO TABS
ORAL_TABLET | ORAL | Status: AC
  Administered 2017-10-05: 20 mg via ORAL
  Filled 2017-10-05: qty 1

## 2017-10-05 MED ORDER — MIDAZOLAM HCL 2 MG/2ML IJ SOLN
INTRAMUSCULAR | Status: AC
  Filled 2017-10-05: qty 2

## 2017-10-05 SURGICAL SUPPLY — 28 items
BLADE SURG 15 STRL LF DISP TIS (BLADE) ×1 IMPLANT
BLADE SURG 15 STRL SS (BLADE) ×1
CANISTER SUCT 1200ML W/VALVE (MISCELLANEOUS) ×2 IMPLANT
CHLORAPREP W/TINT 26ML (MISCELLANEOUS) ×2 IMPLANT
DERMABOND ADVANCED (GAUZE/BANDAGES/DRESSINGS) ×1
DERMABOND ADVANCED .7 DNX12 (GAUZE/BANDAGES/DRESSINGS) ×1 IMPLANT
DRAPE LAPAROTOMY 77X122 PED (DRAPES) ×2 IMPLANT
ELECT REM PT RETURN 9FT ADLT (ELECTROSURGICAL) ×2
ELECTRODE REM PT RTRN 9FT ADLT (ELECTROSURGICAL) ×1 IMPLANT
GLOVE BIO SURGEON STRL SZ7.5 (GLOVE) ×2 IMPLANT
GOWN STRL REUS W/ TWL LRG LVL3 (GOWN DISPOSABLE) ×3 IMPLANT
GOWN STRL REUS W/TWL LRG LVL3 (GOWN DISPOSABLE) ×3
KIT TURNOVER KIT A (KITS) ×2 IMPLANT
LABEL OR SOLS (LABEL) ×2 IMPLANT
MESH SYNTHETIC 4X6 SOFT BARD (Mesh General) ×1 IMPLANT
MESH SYNTHETIC SOFT BARD 4X6 (Mesh General) ×1 IMPLANT
NEEDLE HYPO 25X1 1.5 SAFETY (NEEDLE) ×2 IMPLANT
NS IRRIG 500ML POUR BTL (IV SOLUTION) ×2 IMPLANT
PACK BASIN MINOR ARMC (MISCELLANEOUS) ×2 IMPLANT
SUT CHROMIC 3 0 SH 27 (SUTURE) ×2 IMPLANT
SUT CHROMIC 4 0 RB 1X27 (SUTURE) ×2 IMPLANT
SUT MNCRL 4-0 (SUTURE) ×1
SUT MNCRL 4-0 27XMFL (SUTURE) ×1
SUT MNCRL+ 5-0 UNDYED PC-3 (SUTURE) IMPLANT
SUT MONOCRYL 5-0 (SUTURE)
SUT SURGILON 0 30 BLK (SUTURE) ×4 IMPLANT
SUTURE MNCRL 4-0 27XMF (SUTURE) ×1 IMPLANT
SYR 10ML LL (SYRINGE) ×2 IMPLANT

## 2017-10-05 NOTE — Discharge Instructions (Addendum)
AMBULATORY SURGERY  DISCHARGE INSTRUCTIONS   1) The drugs that you were given will stay in your system until tomorrow so for the next 24 hours you should not:  A) Drive an automobile B) Make any legal decisions C) Drink any alcoholic beverage   2) You may resume regular meals tomorrow.  Today it is better to start with liquids and gradually work up to solid foods.  You may eat anything you prefer, but it is better to start with liquids, then soup and crackers, and gradually work up to solid foods.   3) Please notify your doctor immediately if you have any unusual bleeding, trouble breathing, redness and pain at the surgery site, drainage, fever, or pain not relieved by medication. 4)   5) Your post-operative visit with Dr.                                     is: Date:                        Time:    Please call to schedule your post-operative visit.  6) Additional Instructions:        Take Tylenol or Norco if needed for pain.  Should not drive or do anything dangerous when taking Norco.  May shower and blot dry.  Avoid straining and heavy lifting.

## 2017-10-05 NOTE — Anesthesia Postprocedure Evaluation (Signed)
Anesthesia Post Note  Patient: Gene Brown  Procedure(s) Performed: HERNIA REPAIR UMBILICAL ADULT (N/A )  Patient location during evaluation: PACU Anesthesia Type: General Level of consciousness: awake and alert and oriented Pain management: pain level controlled Vital Signs Assessment: post-procedure vital signs reviewed and stable Respiratory status: spontaneous breathing Cardiovascular status: blood pressure returned to baseline Anesthetic complications: no     Last Vitals:  Vitals:   10/05/17 0935 10/05/17 1020  BP: (!) 179/88 (!) 161/80  Pulse: 86 82  Resp: 16 16  Temp: 36.7 C   SpO2: 95% 98%    Last Pain:  Vitals:   10/05/17 1020  TempSrc:   PainSc: 2                  Georgianne Gritz,Dondre

## 2017-10-05 NOTE — Anesthesia Preprocedure Evaluation (Addendum)
Anesthesia Evaluation  Patient identified by MRN, date of birth, ID band Patient awake    Reviewed: Allergy & Precautions, NPO status , Patient's Chart, lab work & pertinent test results  Airway Mallampati: III  TM Distance: <3 FB     Dental  (+) Chipped   Pulmonary neg pulmonary ROS,    Pulmonary exam normal        Cardiovascular hypertension, Normal cardiovascular exam     Neuro/Psych negative neurological ROS  negative psych ROS   GI/Hepatic Neg liver ROS,   Endo/Other  diabetes, Well Controlled, Type 2, Oral Hypoglycemic Agents  Renal/GU negative Renal ROS  negative genitourinary   Musculoskeletal  (+) Arthritis , Osteoarthritis,    Abdominal Normal abdominal exam  (+)   Peds negative pediatric ROS (+)  Hematology  (+) Blood dyscrasia, ,   Anesthesia Other Findings Past Medical History: 06/25/2015: Arthritis 06/25/2015: BP (high blood pressure) 06/25/2015: H/O varicella 06/25/2015: H/O: gout No date: Sickle cell trait (HCC) 06/25/2015: Type 2 diabetes mellitus (HCC)  Reproductive/Obstetrics                            Anesthesia Physical Anesthesia Plan  ASA: III  Anesthesia Plan: General   Post-op Pain Management:    Induction: Intravenous  PONV Risk Score and Plan:   Airway Management Planned: Oral ETT  Additional Equipment:   Intra-op Plan:   Post-operative Plan: Extubation in OR  Informed Consent: I have reviewed the patients History and Physical, chart, labs and discussed the procedure including the risks, benefits and alternatives for the proposed anesthesia with the patient or authorized representative who has indicated his/her understanding and acceptance.   Dental advisory given  Plan Discussed with: CRNA and Surgeon  Anesthesia Plan Comments:         Anesthesia Quick Evaluation

## 2017-10-05 NOTE — Transfer of Care (Signed)
Immediate Anesthesia Transfer of Care Note  Patient: Gene Brown  Procedure(s) Performed: Procedure(s): HERNIA REPAIR UMBILICAL ADULT (N/A)  Patient Location: PACU  Anesthesia Type:General  Level of Consciousness: sedated  Airway & Oxygen Therapy: Patient Spontanous Breathing and Patient connected to face mask oxygen  Post-op Assessment: Report given to RN and Post -op Vital signs reviewed and stable  Post vital signs: Reviewed and stable  Last Vitals:  Vitals:   10/05/17 0607 10/05/17 0840  BP: (!) 173/105 (!) 166/80  Pulse: 97 (!) 101  Resp: 20 20  Temp: (!) 36.3 C 36.7 C  SpO2: 100% 98%    Complications: No apparent anesthesia complications

## 2017-10-05 NOTE — H&P (Signed)
  He comes today for umbilical hernia repair.  He reports no change in condition since office exam.   Blood sugar 301.  He ran out of diabetes medicine and has new Rx coming soon. Small umbilical hernia demonstrated on exam. Discussed plan for surgery.

## 2017-10-05 NOTE — Anesthesia Procedure Notes (Signed)
Procedure Name: Intubation Date/Time: 10/05/2017 7:37 AM Performed by: Doreen Salvage, CRNA Pre-anesthesia Checklist: Patient identified, Patient being monitored, Timeout performed, Emergency Drugs available and Suction available Patient Re-evaluated:Patient Re-evaluated prior to induction Oxygen Delivery Method: Circle system utilized Preoxygenation: Pre-oxygenation with 100% oxygen Induction Type: IV induction Ventilation: Mask ventilation without difficulty Laryngoscope Size: Mac and 3 Grade View: Grade III Tube type: Oral Tube size: 7.5 mm Number of attempts: 1 Airway Equipment and Method: Stylet Placement Confirmation: ETT inserted through vocal cords under direct vision,  positive ETCO2 and breath sounds checked- equal and bilateral Secured at: 21 cm Tube secured with: Tape Dental Injury: Teeth and Oropharynx as per pre-operative assessment  Comments: MAC 4 needed. Anterior airway

## 2017-10-05 NOTE — Anesthesia Post-op Follow-up Note (Signed)
Anesthesia QCDR form completed.        

## 2017-10-05 NOTE — Op Note (Signed)
OPERATIVE REPORT  PREOPERATIVE  DIAGNOSIS: Umbilical hernia  POSTOPERATIVE DIAGNOSIS: Umbilical hernia  PROCEDURE: Umbilical hernia repair  ANESTHESIA: General  SURGEON: Renda RollsWilton Sheniya Garciaperez M.D.  INDICATIONS: He has had recent pain at the umbilicus associated with activity.  An umbilical hernia was demonstrated on physical exam with associated tenderness.  Surgery was recommended for definitive treatment.  The patient was placed on the operating table in the supine position under general anesthesia. The abdomen was prepared with ChloraPrep and draped in a sterile manner. A transversely oriented supraumbilical curvilinear incision was made and carried down through subcutaneous tissues.  An umbilical hernia sac was dissected away from the skin of the umbilicus.  The sac was dissected free from the fascial ring defect and was inverted. Bard soft mesh was cut to create an oval shape of 2 x 3 cm. It was placed into the properitoneal plane oriented transversely and sutured to the overlying fascia with through and through 0 Surgilon sutures.  The fascial defect was closed with a transversely oriented suture line of interrupted 0 Surgilon figure-of-eight sutures incorporating each suture into the mesh. The skin of the umbilicus was sutured to the subcutaneous tissues with 3-0 chromic suture. The subcutaneous tissues were infiltrated with half percent Sensorcaine with epinephrine. The skin was closed with running 5-0 Monocryl subcuticular suture and LiquiBand.  The patient appeared to be in satisfactory condition and was prepared for transfer to the recovery room  Renda RollsWilton Colena Ketterman M.D.

## 2017-10-10 IMAGING — CR DG TOE GREAT 2+V*L*
1 series · 3 of 3 positions shown · non-contrast
Comparison: No recent prior .

CLINICAL DATA: Nonhealing wound.

EXAM:
LEFT GREAT TOE

[Series 1: dg toe great left · 0.14mm/px · 3 of 3 slices shown]
[im 1/3]
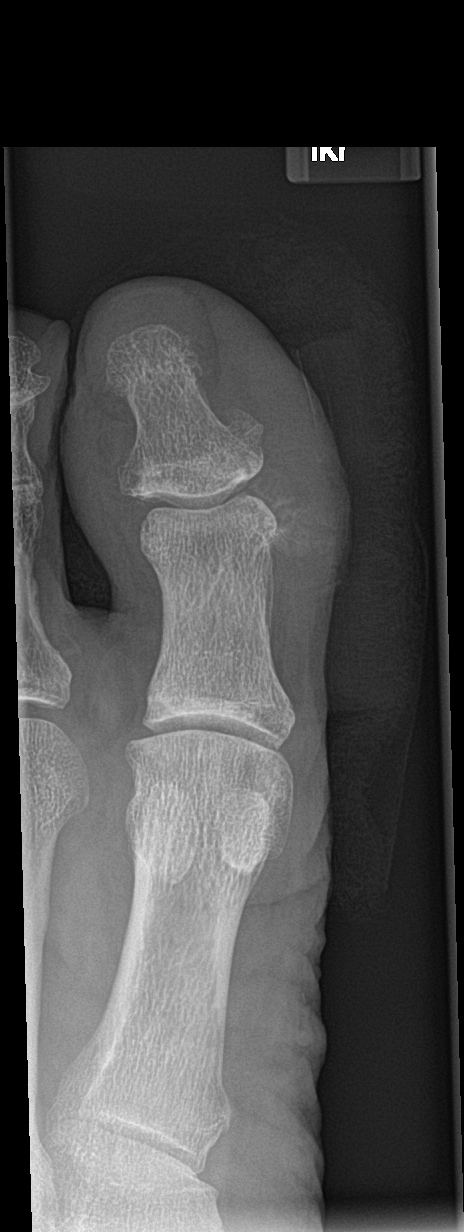
[im 2/3]
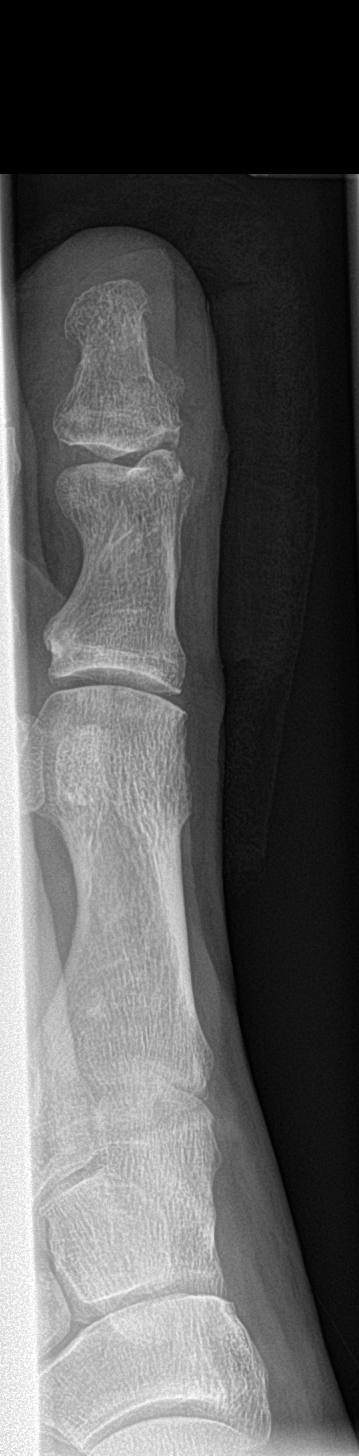
[im 3/3]
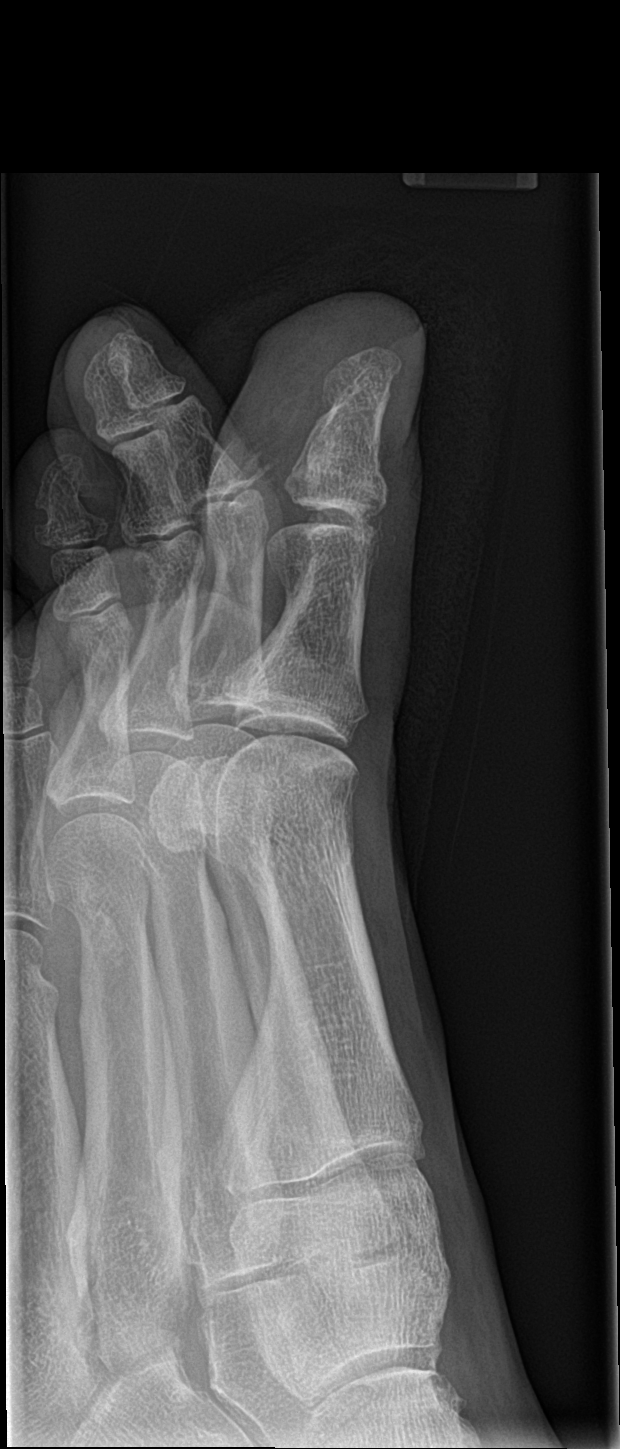

[3 of 3 positions shown; findings below may reference images not displayed]

FINDINGS: No acute bony abnormality identified. Subtle deformity of the medial
aspect of the proximal phalanx of the left great toe noted.
Osteomyelitis cannot be excluded. MRI of the left great toe can be
obtained for further evaluation. No radiopaque foreign body .
IMPRESSION: 1. Subtle for the medial aspect of the proximal phalanx of the left
great toe. Osteomyelitis cannot be completely excluded. MRI can be
obtained as needed.

2.  No soft tissue foreign bodies noted.

## 2017-11-09 IMAGING — MR MR FOOT*L* WO/W CM
9 series · 40 of 40 positions shown · IV contrast (multihance)
Comparison: Plain films of the left great toe 05/24/2016.

CLINICAL DATA: Wound on the plantar surface of the great toe for
over 1 month. Question osteomyelitis.

EXAM:
MRI OF THE LEFT FOREFOOT WITHOUT AND WITH CONTRAST
TECHNIQUE: Multiplanar, multisequence MR imaging was performed both before and
after administration of intravenous contrast.
CONTRAST:  20 ml MULTIHANCE GADOBENATE DIMEGLUMINE 529 MG/ML IV SOLN

[Series 3: T1 · coronal · 3.0mm · 0.56mm/px · 6 of 40 slices shown (1 of 2)]
[im 1/40]
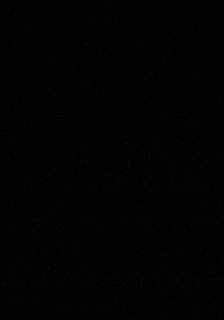
[im 8/40]
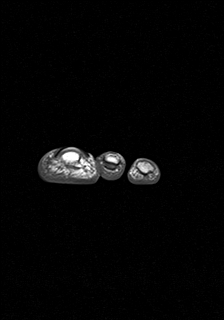
[im 16/40]
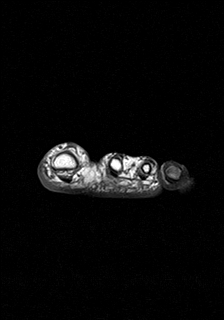
[im 24/40]
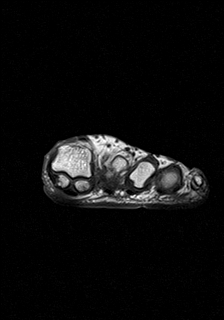
[im 32/40]
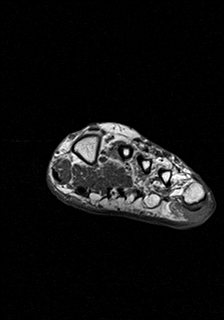
[im 40/40]
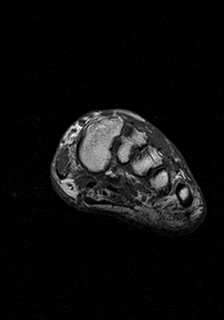

[Series 4: axial t1fs · coronal · 3.0mm · 0.70mm/px · 5 of 39 slices shown]
[im 1/39]
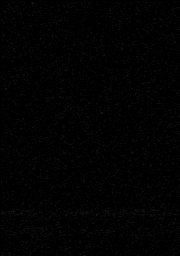
[im 10/39]
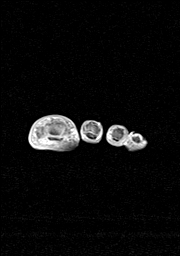
[im 20/39]
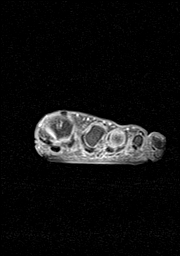
[im 29/39]
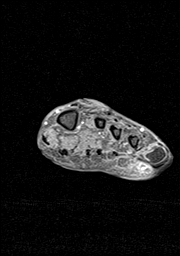
[im 39/39]
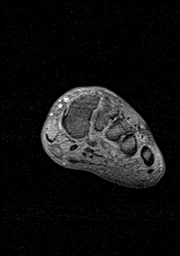

[Series 5: T2 fat-sat · coronal · 3.0mm · 0.35mm/px · 6 of 40 slices shown]
[im 1/40]
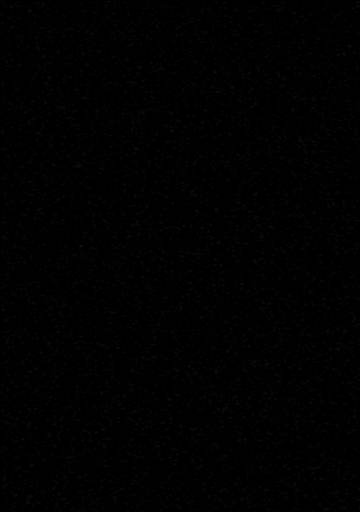
[im 8/40]
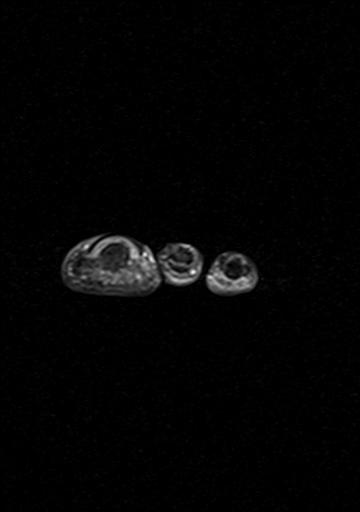
[im 16/40]
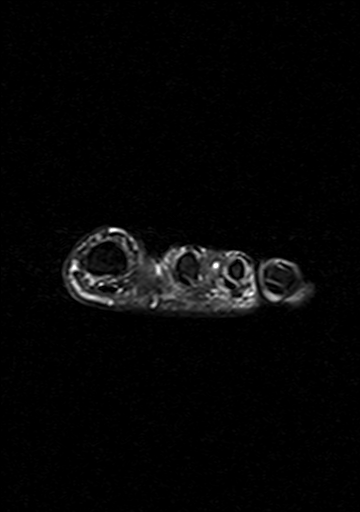
[im 24/40]
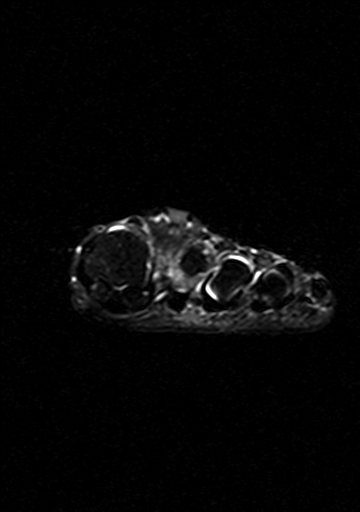
[im 32/40]
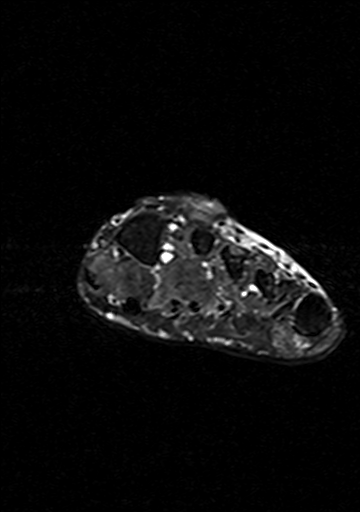
[im 40/40]
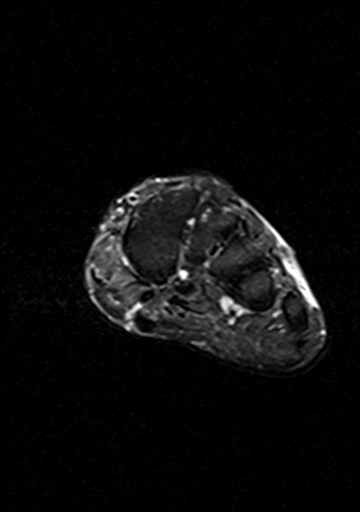

[Series 6: T1 · axial · 3.0mm · 0.62mm/px · z∈[-74,-2]mm · 3 of 23 slices shown (2 of 2)]
[im 1/23]
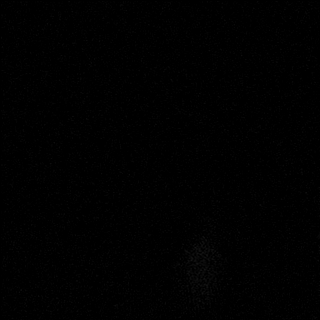
[im 12/23]
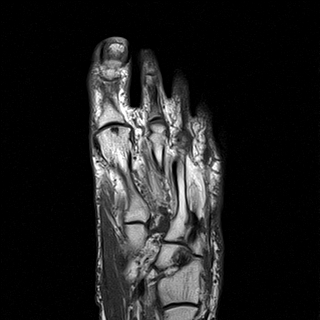
[im 23/23]
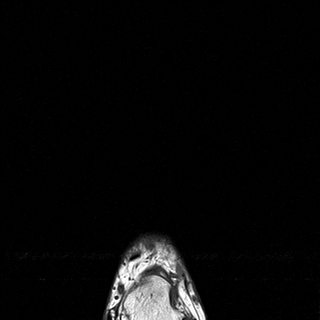

[Series 7: STIR · axial · 3.0mm · 0.39mm/px · z∈[-74,-2]mm · 3 of 23 slices shown (1 of 2)]
[im 1/23]
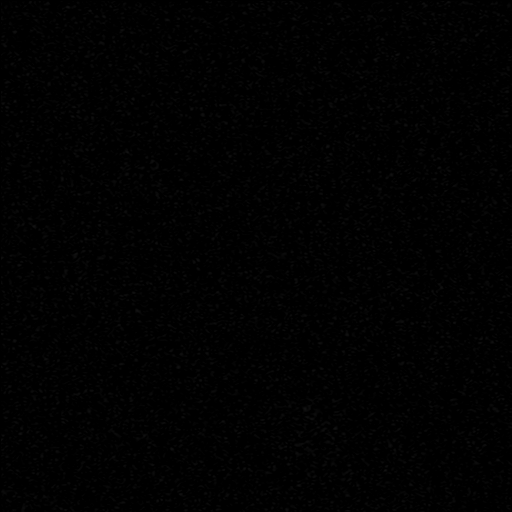
[im 12/23]
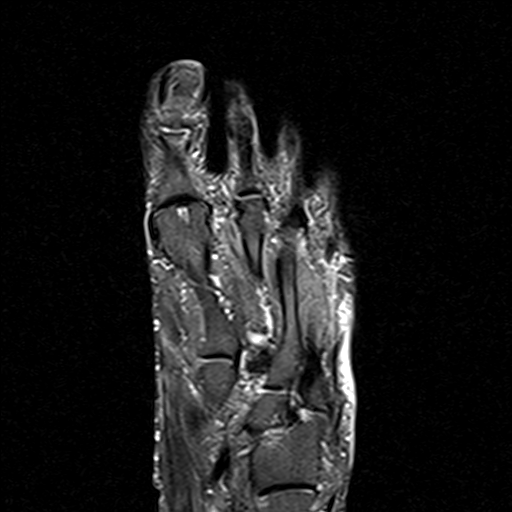
[im 23/23]
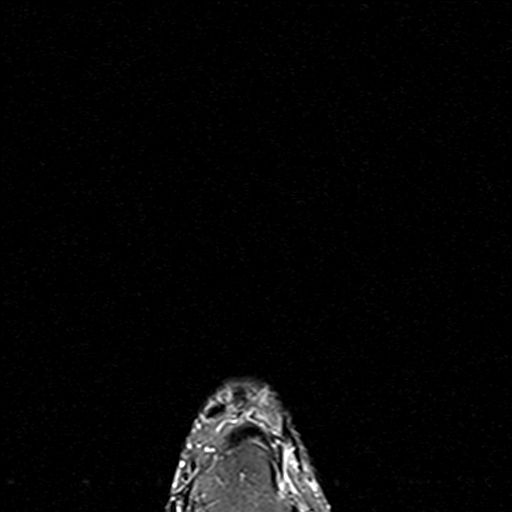

[Series 8: STIR · sagittal · 3.0mm · 0.39mm/px · 4 of 31 slices shown (2 of 2)]
[im 1/31]
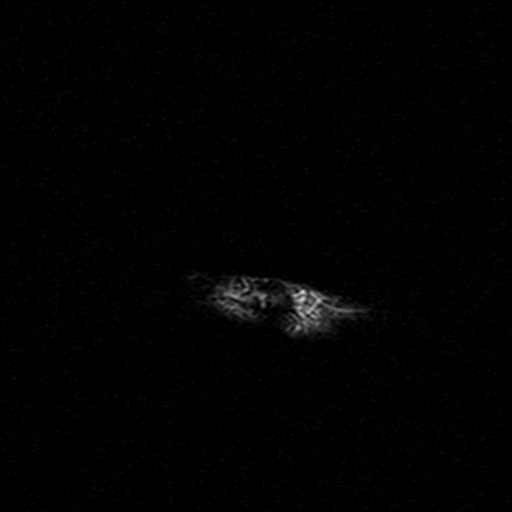
[im 11/31]
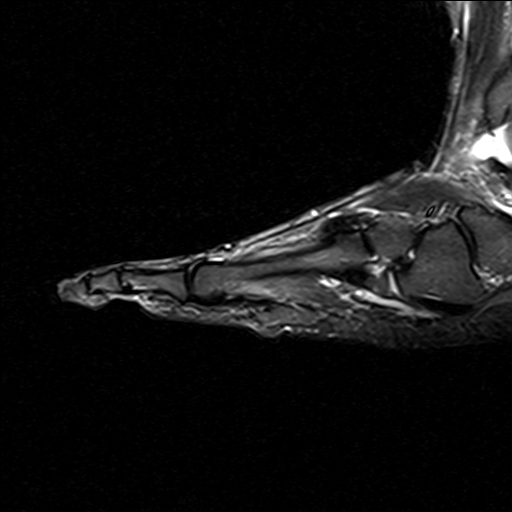
[im 21/31]
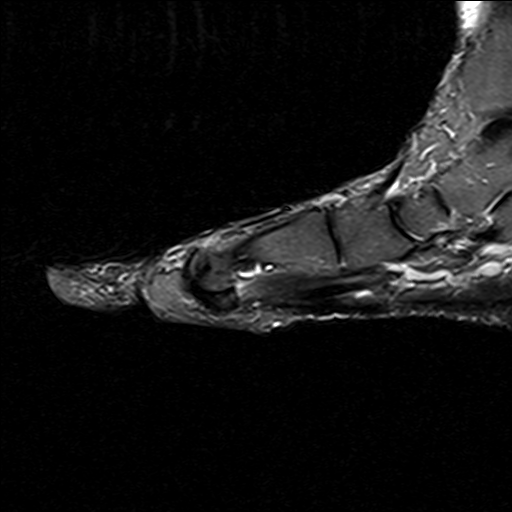
[im 31/31]
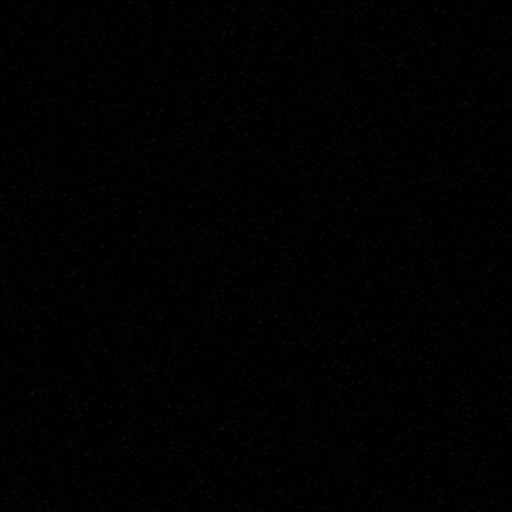

[Series 9: T1 fat-sat · coronal · 3.0mm · 0.70mm/px · 6 of 40 slices shown (1 of 3)]
[im 1/40]
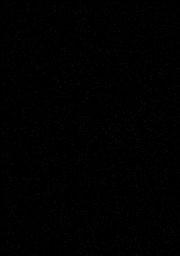
[im 8/40]
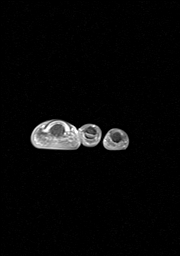
[im 16/40]
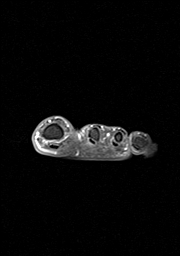
[im 24/40]
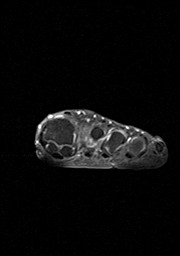
[im 32/40]
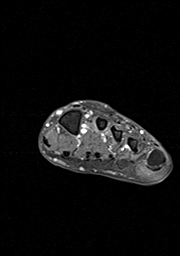
[im 40/40]
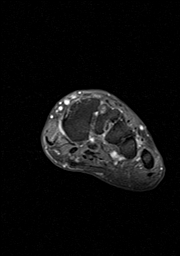

[Series 10: T1 fat-sat · axial · 3.0mm · 0.78mm/px · z∈[-74,-2]mm · 3 of 23 slices shown (2 of 3)]
[im 1/23]
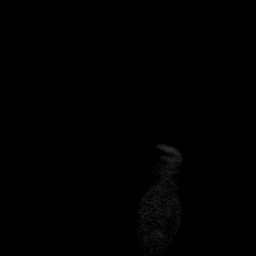
[im 12/23]
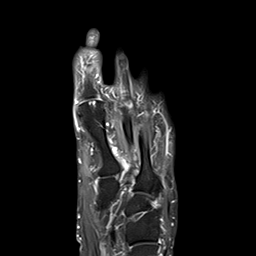
[im 23/23]
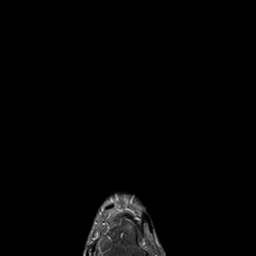

[Series 11: T1 fat-sat · sagittal · 3.0mm · 0.78mm/px · 4 of 31 slices shown (3 of 3)]
[im 1/31]
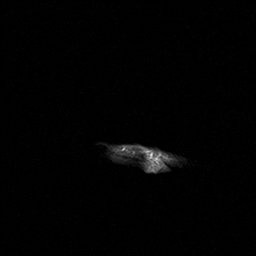
[im 11/31]
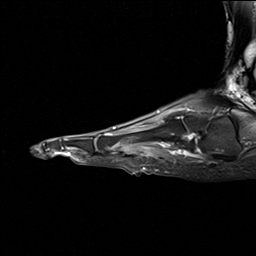
[im 21/31]
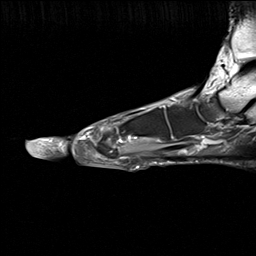
[im 31/31]
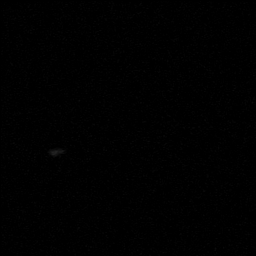

[40 of 40 positions shown; findings below may reference images not displayed]

FINDINGS: No bone marrow edema or enhancement to suggest osteomyelitis is
identified. Mild first MTP osteoarthritis is seen with small
subchondral cysts in the head of the first metatarsal. There is also
some midfoot degenerative change.

No abscess is identified. Mild edema in subcutaneous fat is seen
along the plantar surface of the great toe eccentric on the medial
side suggestive of cellulitis. There is no evidence of septic joint.
Intrinsic musculature show some atrophy but is otherwise
unremarkable without infectious or inflammatory change. No mass is
seen.
IMPRESSION: Negative for abscess, osteomyelitis or septic joint.

Mild appearing edema and enhancement in subcutaneous fat about the
plantar surface of the great toe on the medial side is compatible
with cellulitis.

## 2017-11-22 ENCOUNTER — Ambulatory Visit
Admission: RE | Admit: 2017-11-22 | Discharge: 2017-11-22 | Disposition: A | Discharge status: Home / Self Care | Payer: Medicare Other | Source: Ambulatory Visit | Attending: Urology | Admitting: Urology

## 2017-11-22 ENCOUNTER — Ambulatory Visit: Payer: Medicare Other | Admitting: Urology

## 2017-11-22 ENCOUNTER — Telehealth: Payer: Self-pay | Admitting: Family Medicine

## 2017-11-22 ENCOUNTER — Ambulatory Visit (INDEPENDENT_AMBULATORY_CARE_PROVIDER_SITE_OTHER): Payer: Medicare Other | Admitting: Urology

## 2017-11-22 ENCOUNTER — Encounter: Payer: Self-pay | Admitting: Urology

## 2017-11-22 VITALS — BP 146/84 | HR 114 | Resp 16 | Ht 68.0 in | Wt 248.0 lb

## 2017-11-22 DIAGNOSIS — R3129 Other microscopic hematuria: Secondary | ICD-10-CM

## 2017-11-22 DIAGNOSIS — N401 Enlarged prostate with lower urinary tract symptoms: Secondary | ICD-10-CM

## 2017-11-22 DIAGNOSIS — R351 Nocturia: Secondary | ICD-10-CM | POA: Diagnosis not present

## 2017-11-22 LAB — URINALYSIS, COMPLETE
Bilirubin, UA: NEGATIVE
KETONES UA: NEGATIVE
Nitrite, UA: NEGATIVE
PH UA: 5 (ref 5.0–7.5)
Specific Gravity, UA: 1.015 (ref 1.005–1.030)
Urobilinogen, Ur: 1 mg/dL (ref 0.2–1.0)

## 2017-11-22 LAB — BLADDER SCAN AMB NON-IMAGING

## 2017-11-22 LAB — MICROSCOPIC EXAMINATION: EPITHELIAL CELLS (NON RENAL): NONE SEEN /HPF (ref 0–10)

## 2017-11-22 MED ORDER — SULFAMETHOXAZOLE-TRIMETHOPRIM 800-160 MG PO TABS: 1.0000 | ORAL_TABLET | Freq: Two times a day (BID) | ORAL | 0 refills | Status: DC

## 2017-11-22 NOTE — Telephone Encounter (Signed)
-----   Message from Riki AltesScott C Stoioff, MD sent at 11/22/2017  4:22 PM EDT ----- KUB shows no evidence of stone

## 2017-11-22 NOTE — Telephone Encounter (Signed)
LMOM with no stone result

## 2017-11-22 NOTE — Progress Notes (Signed)
11/22/2017 11:43 AM   Stoney BangPaul V Brisbon 11/09/1950 [id number]  Referring provider: Barbette ReichmannHande, Vishwanath, MD [address] [facility] [place], [place] [id number]  Chief complaint: Urinary problems  HPI: 67 year old male previously followed here for an elevated PSA, BPH with lower urinary tract symptoms and erectile dysfunction.  He had a negative prostate biopsy in November 2016 for PSA of 5.5.  He was subsequently started on finasteride.  He was last seen here in June 2018 and uncorrected PSA was 1.8. PVR at his last visit was 187 mL.  He states 2 days ago he had onset of urinary hesitancy and decreased force and caliber of his urinary stream.  He denies dysuria or gross hematuria.  He thinks he may have a "stone blocking his proximal urethra".  He had a CT of the abdomen and pelvis in February 2019 which showed no urinary tract calculi and he has no previous history of stone disease.  He denies fever, chills or gross hematuria.   PMH: Past Medical History:  Diagnosis Date  . Arthritis 06/25/2015  . BP (high blood pressure) 06/25/2015  . H/O varicella 06/25/2015  . H/O: gout 06/25/2015  . Sickle cell trait (HCC)   . Type 2 diabetes mellitus (HCC) 06/25/2015    Surgical History: Past Surgical History:  Procedure Laterality Date  . ANTERIOR CRUCIATE LIGAMENT REPAIR Left 1982   2004-2005. HAS HAD 3 OF SAME SURGERY  . GASTRIC BYPASS  2006  . INGUINAL HERNIA REPAIR    . MENISCUS REPAIR Left 2014   SCREW IN KNEE  . TONSILLECTOMY AND ADENOIDECTOMY    . UMBILICAL HERNIA REPAIR N/A 10/05/2017   Procedure: HERNIA REPAIR UMBILICAL ADULT;  Surgeon: Nadeen LandauSmith, Jarvis Wilton, MD;  Location: [facility] ORS;  Service: General;  Laterality: N/A;  . VASECTOMY      Home Medications:  Allergies as of 11/22/2017      Reactions   Sitagliptin Other (See Comments)   Extreme family history of cancer      Medication List        Accurate as of 11/22/17 11:43 AM. Always use your most recent  med list.          co-enzyme Q-10 30 MG capsule Take 30 mg by mouth 3 (three) times daily.   finasteride 5 MG tablet Commonly known as:  PROSCAR Take 1 tablet (5 mg total) by mouth daily.   glipiZIDE 5 MG tablet Commonly known as:  GLUCOTROL Take 5 mg by mouth 2 (two) times daily before a meal.   HYDROcodone-acetaminophen 5-325 MG tablet Commonly known as:  NORCO Take 1-2 tablets by mouth every 6 (six) hours as needed for moderate pain.   LANTUS 100 UNIT/ML injection Generic drug:  insulin glargine ADM 20 UNITS [place] QD   lisinopril 40 MG tablet Commonly known as:  PRINIVIL,ZESTRIL IN THE MORNING   magnesium 30 MG tablet Take 30 mg by mouth 2 (two) times daily.   metFORMIN 1000 MG tablet Commonly known as:  GLUCOPHAGE TAKE 1 TABLET (1,000 MG TOTAL) BY MOUTH 2 (TWO) TIMES DAILY WITH MEALS.   RA VITAMIN B-12 TR 1000 MCG Tbcr Generic drug:  Cyanocobalamin Take by mouth. IN THE MORNING   sildenafil 20 MG tablet Commonly known as:  REVATIO Take 1 tablet (20 mg total) by mouth as needed (2-5 tablets daily as needed). Take 2- 5 tabs PO daily prn   tamsulosin 0.4 MG Caps capsule Commonly known as:  FLOMAX Take 1 capsule (0.4 mg total) by  mouth daily.   VITAMIN D-1000 MAX ST 1000 units tablet Generic drug:  Cholecalciferol Take by mouth. IN MORNING   zinc gluconate 50 MG tablet Take 50 mg by mouth daily.       Allergies:  Allergies  Allergen Reactions  . Sitagliptin Other (See Comments)    Extreme family history of cancer    Family History: Family History  Problem Relation Age of Onset  . Prostate cancer Father   . Hematuria Neg Hx   . Renal cancer Neg Hx     Social History:  reports that he has never smoked. He has never used smokeless tobacco. He reports that he does not drink alcohol or use drugs.  ROS: UROLOGY Frequent Urination?: No Hard to postpone urination?: No Burning/pain with urination?: No Get up at night to urinate?: No Leakage of  urine?: No Urine stream starts and stops?: No Trouble starting stream?: Yes Do you have to strain to urinate?: Yes Blood in urine?: No Urinary tract infection?: No Sexually transmitted disease?: No Injury to kidneys or bladder?: No Painful intercourse?: No Weak stream?: Yes Erection problems?: Yes Penile pain?: No  Gastrointestinal Nausea?: No Vomiting?: No Indigestion/heartburn?: No Diarrhea?: No Constipation?: No  Constitutional Fever: No Night sweats?: No Weight loss?: No Fatigue?: Yes  Skin Skin rash/lesions?: No Itching?: No  Eyes Blurred vision?: No Double vision?: No  Ears/Nose/Throat Sore throat?: No Sinus problems?: No  Hematologic/Lymphatic Swollen glands?: No Easy bruising?: No  Cardiovascular Leg swelling?: Yes Chest pain?: No  Respiratory Cough?: No Shortness of breath?: No  Endocrine Excessive thirst?: No  Musculoskeletal Back pain?: No Joint pain?: No  Neurological Headaches?: No Dizziness?: No  Psychologic Depression?: No Anxiety?: No  Physical Exam: BP (!) 146/84   Pulse (!) 114   Resp 16   Ht 5\' 8"  (1.727 m)   Wt 248 lb (112.5 kg)   SpO2 98%   BMI 37.71 kg/m   Constitutional:  Alert and oriented, No acute distress. HEENT: [place] AT, moist mucus membranes.  Trachea midline, no masses. Cardiovascular: No clubbing, cyanosis, or edema. Respiratory: Normal respiratory effort, no increased work of breathing. GI: Abdomen is soft, nontender, nondistended, no abdominal masses GU: No CVA tenderness.  Prostate 60 g, smooth without nodules. Lymph: No cervical or inguinal lymphadenopathy. Skin: No rashes, bruises or suspicious lesions. Neurologic: Grossly intact, no focal deficits, moving all 4 extremities. Psychiatric: Normal mood and affect.  Laboratory Data: No results found for: WBC, HGB, HCT, MCV, PLT  Lab Results  Component Value Date   CREATININE 0.90 09/14/2017    Urinalysis Dipstick: 3+ glucose, 3+ blood, 1+  leukocyte Microscopy: 11-30 WBC, 11-30 RBC, moderate bacteria  Pertinent Imaging: A KUB was ordered which showed no calcifications in the true bony pelvis.  Assessment & Plan:   67 year old male with obstructive voiding symptoms and pyuria.  PVR by bladder scan was stable at 198 mL.  He was informed that his symptoms are most likely secondary to UTI/prostatitis.  A urine culture was ordered.  Rx Septra DS was sent to his pharmacy pending the culture report.  He will be due for follow-up in approximately 2 months for a PSA.   Riki Altes, MD  [facility] [address] [place], [place] [address] [phone]

## 2017-11-23 ENCOUNTER — Encounter: Payer: Self-pay | Admitting: Urology

## 2017-11-25 LAB — CULTURE, URINE COMPREHENSIVE

## 2017-11-27 ENCOUNTER — Telehealth: Payer: Self-pay | Admitting: Family Medicine

## 2017-11-27 NOTE — Telephone Encounter (Signed)
Patient notified that His UCX is negative and his KUB was negative for stone as well.

## 2017-11-27 NOTE — Telephone Encounter (Signed)
-----   Message from Riki AltesScott C Stoioff, MD sent at 11/26/2017  9:53 AM EDT ----- Urine culture was positive and sensitive to prescribed antibiotic.

## 2018-01-25 ENCOUNTER — Ambulatory Visit: Payer: Medicare Other

## 2018-02-12 ENCOUNTER — Other Ambulatory Visit: Payer: Medicare Other

## 2018-02-14 ENCOUNTER — Ambulatory Visit: Payer: Medicare Other | Admitting: Urology

## 2018-04-02 ENCOUNTER — Encounter: Payer: Self-pay | Admitting: Urology

## 2018-04-02 ENCOUNTER — Other Ambulatory Visit: Payer: Medicare Other

## 2018-04-03 ENCOUNTER — Other Ambulatory Visit: Payer: Medicare Other

## 2018-04-04 ENCOUNTER — Other Ambulatory Visit: Payer: Medicare Other

## 2018-04-04 DIAGNOSIS — R972 Elevated prostate specific antigen [PSA]: Secondary | ICD-10-CM

## 2018-04-05 ENCOUNTER — Encounter: Payer: Self-pay | Admitting: Urology

## 2018-04-05 ENCOUNTER — Other Ambulatory Visit: Payer: Self-pay

## 2018-04-05 ENCOUNTER — Ambulatory Visit (INDEPENDENT_AMBULATORY_CARE_PROVIDER_SITE_OTHER): Payer: Medicare Other | Admitting: Urology

## 2018-04-05 VITALS — BP 130/73 | HR 68 | Ht 68.0 in | Wt 253.4 lb

## 2018-04-05 DIAGNOSIS — R3914 Feeling of incomplete bladder emptying: Secondary | ICD-10-CM | POA: Diagnosis not present

## 2018-04-05 DIAGNOSIS — N401 Enlarged prostate with lower urinary tract symptoms: Secondary | ICD-10-CM | POA: Diagnosis not present

## 2018-04-05 LAB — PSA: Prostate Specific Ag, Serum: 1.9 ng/mL (ref 0.0–4.0)

## 2018-04-05 LAB — BLADDER SCAN AMB NON-IMAGING

## 2018-04-05 NOTE — Addendum Note (Signed)
Addended by: Frankey ShownGLANTON, Lorenzo Arscott C on: 04/05/2018 02:41 PM   Modules accepted: Orders

## 2018-04-05 NOTE — Progress Notes (Signed)
04/05/2018 2:01 PM   Gene Brown 05-24-1951 [id number]  Referring provider: Barbette Reichmann, MD [address] [facility] [place], [place] [address]  Chief Complaint  Patient presents with  . Elevated PSA   Urologic history: 1.  Elevated PSA -Biopsy November 2016; PSA 5.5 with benign pathology  2.  BPH with incomplete bladder emptying -On combination therapy tamsulosin/finasteride -PVR ~ 200 mL  HPI: 67 year old male presents for follow-up.  He was last seen April 2019 and was having worsening voiding symptoms and urine culture was positive for E. coli.  He states his symptoms resolved after completing antibiotic therapy.  He presently has no bothersome lower urinary tract symptoms.  He remains on tamsulosin and finasteride.   PMH: Past Medical History:  Diagnosis Date  . Arthritis 06/25/2015  . BP (high blood pressure) 06/25/2015  . H/O varicella 06/25/2015  . H/O: gout 06/25/2015  . Sickle cell trait (HCC)   . Type 2 diabetes mellitus (HCC) 06/25/2015    Surgical History: Past Surgical History:  Procedure Laterality Date  . ANTERIOR CRUCIATE LIGAMENT REPAIR Left 1982   2004-2005. HAS HAD 3 OF SAME SURGERY  . GASTRIC BYPASS  2006  . INGUINAL HERNIA REPAIR    . MENISCUS REPAIR Left 2014   SCREW IN KNEE  . TONSILLECTOMY AND ADENOIDECTOMY    . UMBILICAL HERNIA REPAIR N/A 10/05/2017   Procedure: HERNIA REPAIR UMBILICAL ADULT;  Surgeon: Nadeen Landau, MD;  Location: [facility] ORS;  Service: General;  Laterality: N/A;  . VASECTOMY      Home Medications:  Allergies as of 04/05/2018      Reactions   Sitagliptin Other (See Comments)   Extreme family history of cancer      Medication List        Accurate as of 04/05/18  2:01 PM. Always use your most recent med list.          co-enzyme Q-10 30 MG capsule Take 30 mg by mouth 3 (three) times daily.   finasteride 5 MG tablet Commonly known as:  PROSCAR Take 1 tablet (5 mg total) by mouth  daily.   glipiZIDE 5 MG tablet Commonly known as:  GLUCOTROL Take 5 mg by mouth 2 (two) times daily before a meal.   HYDROcodone-acetaminophen 5-325 MG tablet Commonly known as:  NORCO/VICODIN Take 1-2 tablets by mouth every 6 (six) hours as needed for moderate pain.   LANTUS 100 UNIT/ML injection Generic drug:  insulin glargine ADM 20 UNITS [place] QD   lisinopril 40 MG tablet Commonly known as:  PRINIVIL,ZESTRIL IN THE MORNING   magnesium 30 MG tablet Take 30 mg by mouth 2 (two) times daily.   metFORMIN 1000 MG tablet Commonly known as:  GLUCOPHAGE TAKE 1 TABLET (1,000 MG TOTAL) BY MOUTH 2 (TWO) TIMES DAILY WITH MEALS.   RA VITAMIN B-12 TR 1000 MCG Tbcr Generic drug:  Cyanocobalamin Take by mouth. IN THE MORNING   sildenafil 20 MG tablet Commonly known as:  REVATIO Take 1 tablet (20 mg total) by mouth as needed (2-5 tablets daily as needed). Take 2- 5 tabs PO daily prn   tamsulosin 0.4 MG Caps capsule Commonly known as:  FLOMAX Take 1 capsule (0.4 mg total) by mouth daily.   VITAMIN D-1000 MAX ST 1000 units tablet Generic drug:  Cholecalciferol Take by mouth. IN MORNING   zinc gluconate 50 MG tablet Take 50 mg by mouth daily.       Allergies:  Allergies  Allergen Reactions  .  Sitagliptin Other (See Comments)    Extreme family history of cancer    Family History: Family History  Problem Relation Age of Onset  . Prostate cancer Father   . Hematuria Neg Hx   . Renal cancer Neg Hx     Social History:  reports that he has never smoked. He has never used smokeless tobacco. He reports that he does not drink alcohol or use drugs.  ROS: UROLOGY Frequent Urination?: No Hard to postpone urination?: No Burning/pain with urination?: No Get up at night to urinate?: No Leakage of urine?: No Urine stream starts and stops?: No Trouble starting stream?: No Do you have to strain to urinate?: No Blood in urine?: No Urinary tract infection?: No Sexually transmitted  disease?: No Injury to kidneys or bladder?: No Painful intercourse?: No Weak stream?: No Erection problems?: No Penile pain?: No  Gastrointestinal Nausea?: No Vomiting?: No Indigestion/heartburn?: No Diarrhea?: No Constipation?: No  Constitutional Fever: No Night sweats?: No Weight loss?: No Fatigue?: Yes  Skin Skin rash/lesions?: No Itching?: No  Eyes Blurred vision?: No Double vision?: No  Ears/Nose/Throat Sore throat?: No Sinus problems?: No  Hematologic/Lymphatic Swollen glands?: No Easy bruising?: No  Cardiovascular Leg swelling?: No Chest pain?: No  Respiratory Cough?: No Shortness of breath?: No  Endocrine Excessive thirst?: No  Musculoskeletal Back pain?: No Joint pain?: Yes  Neurological Headaches?: No Dizziness?: No  Psychologic Depression?: No Anxiety?: No  Physical Exam: BP 130/73   Pulse 68   Ht 5\' 8"  (1.727 m)   Wt 253 lb 6.4 oz (114.9 kg)   BMI 38.53 kg/m   Constitutional:  Alert and oriented, No acute distress. HEENT: [place] AT, moist mucus membranes.  Trachea midline, no masses. Cardiovascular: No clubbing, cyanosis, or edema. Respiratory: Normal respiratory effort, no increased work of breathing. GI: Abdomen is soft, nontender, nondistended, no abdominal masses GU: No CVA tenderness Lymph: No cervical or inguinal lymphadenopathy. Skin: No rashes, bruises or suspicious lesions. Neurologic: Grossly intact, no focal deficits, moving all 4 extremities. Psychiatric: Normal mood and affect.   Assessment & Plan:   67 year old male with history of E. coli UTI with resolution of his symptoms.  He presently is at baseline.  PVR by bladder scan today was stable at 219 mL.  Follow-up 6 months for recheck/bladder scan   Riki AltesScott C Astin Sayre, MD  [facility] [address], [address] [place], [place] [address] [phone]

## 2018-09-25 ENCOUNTER — Emergency Department: Payer: Medicare Other

## 2018-09-25 ENCOUNTER — Encounter: Payer: Self-pay | Admitting: Emergency Medicine

## 2018-09-25 ENCOUNTER — Emergency Department
Admission: EM | Admit: 2018-09-25 | Discharge: 2018-09-25 | Disposition: A | Discharge status: Home / Self Care | Payer: Medicare Other | Attending: Emergency Medicine | Admitting: Emergency Medicine

## 2018-09-25 ENCOUNTER — Other Ambulatory Visit: Payer: Self-pay

## 2018-09-25 DIAGNOSIS — Z794 Long term (current) use of insulin: Secondary | ICD-10-CM | POA: Diagnosis not present

## 2018-09-25 DIAGNOSIS — E119 Type 2 diabetes mellitus without complications: Secondary | ICD-10-CM | POA: Diagnosis not present

## 2018-09-25 DIAGNOSIS — Y9389 Activity, other specified: Secondary | ICD-10-CM | POA: Insufficient documentation

## 2018-09-25 DIAGNOSIS — S62639B Displaced fracture of distal phalanx of unspecified finger, initial encounter for open fracture: Secondary | ICD-10-CM

## 2018-09-25 DIAGNOSIS — S63293A Dislocation of distal interphalangeal joint of left middle finger, initial encounter: Secondary | ICD-10-CM | POA: Insufficient documentation

## 2018-09-25 DIAGNOSIS — W312XXA Contact with powered woodworking and forming machines, initial encounter: Secondary | ICD-10-CM | POA: Insufficient documentation

## 2018-09-25 DIAGNOSIS — Y998 Other external cause status: Secondary | ICD-10-CM | POA: Insufficient documentation

## 2018-09-25 DIAGNOSIS — Z9884 Bariatric surgery status: Secondary | ICD-10-CM | POA: Diagnosis not present

## 2018-09-25 DIAGNOSIS — Z79899 Other long term (current) drug therapy: Secondary | ICD-10-CM | POA: Diagnosis not present

## 2018-09-25 DIAGNOSIS — Y929 Unspecified place or not applicable: Secondary | ICD-10-CM | POA: Insufficient documentation

## 2018-09-25 DIAGNOSIS — S62633A Displaced fracture of distal phalanx of left middle finger, initial encounter for closed fracture: Secondary | ICD-10-CM | POA: Insufficient documentation

## 2018-09-25 DIAGNOSIS — Z23 Encounter for immunization: Secondary | ICD-10-CM | POA: Diagnosis not present

## 2018-09-25 DIAGNOSIS — D573 Sickle-cell trait: Secondary | ICD-10-CM | POA: Insufficient documentation

## 2018-09-25 DIAGNOSIS — S62609A Fracture of unspecified phalanx of unspecified finger, initial encounter for closed fracture: Secondary | ICD-10-CM

## 2018-09-25 DIAGNOSIS — S6992XA Unspecified injury of left wrist, hand and finger(s), initial encounter: Secondary | ICD-10-CM | POA: Diagnosis present

## 2018-09-25 MED ORDER — LIDOCAINE HCL (PF) 1 % IJ SOLN
5.0000 mL | Freq: Once | INTRAMUSCULAR | Status: AC
  Administered 2018-09-25: 5 mL
  Filled 2018-09-25: qty 5

## 2018-09-25 MED ORDER — TETANUS-DIPHTH-ACELL PERTUSSIS 5-2.5-18.5 LF-MCG/0.5 IM SUSP
0.5000 mL | Freq: Once | INTRAMUSCULAR | Status: AC
  Administered 2018-09-25: 0.5 mL via INTRAMUSCULAR
  Filled 2018-09-25: qty 0.5

## 2018-09-25 MED ORDER — SULFAMETHOXAZOLE-TRIMETHOPRIM 800-160 MG PO TABS
1.0000 | ORAL_TABLET | Freq: Once | ORAL | Status: AC
  Administered 2018-09-25: 1 via ORAL
  Filled 2018-09-25: qty 1

## 2018-09-25 MED ORDER — LIDOCAINE HCL (PF) 1 % IJ SOLN
5.0000 mL | Freq: Once | INTRAMUSCULAR | Status: AC
  Administered 2018-09-25: 5 mL

## 2018-09-25 MED ORDER — LIDOCAINE HCL (PF) 1 % IJ SOLN
INTRAMUSCULAR | Status: AC
  Administered 2018-09-25: 5 mL
  Filled 2018-09-25: qty 5

## 2018-09-25 MED ORDER — TRAMADOL HCL 50 MG PO TABS: 50.0000 mg | ORAL_TABLET | Freq: Two times a day (BID) | ORAL | 0 refills | Status: DC | PRN

## 2018-09-25 MED ORDER — SULFAMETHOXAZOLE-TRIMETHOPRIM 800-160 MG PO TABS: 1.0000 | ORAL_TABLET | Freq: Two times a day (BID) | ORAL | 0 refills | Status: DC

## 2018-09-25 NOTE — Discharge Instructions (Signed)
Follow discharge care instruction take medication as directed.  Call orthopedic clinic today and tell them you follow-up emergency room so they can schedule you for definitive evaluation and treatment.  Wear splint until evaluation by orthopedics.

## 2018-09-25 NOTE — ED Notes (Signed)
See triage note   States he was using a table saw   Laceration noted to left middle finger  Describes laceration as being on a 45 degree angle to finger

## 2018-09-25 NOTE — ED Triage Notes (Signed)
Says he was using table saw and got his left middle finger laceration and feels it may have gone through bone.

## 2018-09-25 NOTE — ED Provider Notes (Signed)
[facility] Emergency Department Provider Note   ____________________________________________   None    (approximate)  I have reviewed the triage vital signs and the nursing notes.   HISTORY  Chief Complaint Laceration    HPI Gene Brown is a 68 y.o. male patient presents with a table saw laceration to the distal left middle finger.  Patient believes he cut the bone at the distal phalanx.  Patient denies loss sensation or loss of function.  Patient is right-hand dominant.  Patient states tetanus shot is no up-to-date.   Patient rates pain as a 10/10.  Patient described pain is "sharp".   Past Medical History:  Diagnosis Date  . Arthritis 06/25/2015  . BP (high blood pressure) 06/25/2015  . H/O varicella 06/25/2015  . H/O: gout 06/25/2015  . Sickle cell trait (HCC)   . Type 2 diabetes mellitus (HCC) 06/25/2015    Patient Active Problem List   Diagnosis Date Noted  . Elevated PSA 01/24/2017  . Nocturia 01/24/2017  . Erectile dysfunction due to arterial insufficiency 01/24/2017  . Arthritis 06/25/2015  . H/O varicella 06/25/2015  . H/O: gout 06/25/2015  . H/O nutritional disorder 06/25/2015  . HLD (hyperlipidemia) 06/25/2015  . BP (high blood pressure) 06/25/2015  . Sickle cell trait (HCC) 06/25/2015  . Type 2 diabetes mellitus (HCC) 06/25/2015  . Low serum vitamin D 06/17/2015    Past Surgical History:  Procedure Laterality Date  . ANTERIOR CRUCIATE LIGAMENT REPAIR Left 1982   2004-2005. HAS HAD 3 OF SAME SURGERY  . GASTRIC BYPASS  2006  . INGUINAL HERNIA REPAIR    . MENISCUS REPAIR Left 2014   SCREW IN KNEE  . TONSILLECTOMY AND ADENOIDECTOMY    . UMBILICAL HERNIA REPAIR N/A 10/05/2017   Procedure: HERNIA REPAIR UMBILICAL ADULT;  Surgeon: Nadeen Landau, MD;  Location: [facility] ORS;  Service: General;  Laterality: N/A;  . VASECTOMY      Prior to Admission medications   Medication Sig Start Date End Date Taking? Authorizing  Provider  Cholecalciferol (VITAMIN D-1000 MAX ST) 1000 UNITS tablet Take by mouth. IN MORNING    [provider]  co-enzyme Q-10 30 MG capsule Take 30 mg by mouth 3 (three) times daily.    [provider]  Cyanocobalamin (RA VITAMIN B-12 TR) 1000 MCG TBCR Take by mouth. IN THE MORNING    [provider]  finasteride (PROSCAR) 5 MG tablet Take 1 tablet (5 mg total) by mouth daily. Patient taking differently: Take 5 mg by mouth daily. IN THE MORNING 03/20/17   Sebastian Ache, MD  glipiZIDE (GLUCOTROL) 5 MG tablet Take 5 mg by mouth 2 (two) times daily before a meal.  06/17/15   [provider]  HYDROcodone-acetaminophen (NORCO) 5-325 MG tablet Take 1-2 tablets by mouth every 6 (six) hours as needed for moderate pain. 10/05/17   Nadeen Landau, MD  LANTUS 100 UNIT/ML injection ADM 20 UNITS [place] QD 09/24/15   [provider]  lisinopril (PRINIVIL,ZESTRIL) 40 MG tablet IN THE MORNING 12/03/15   [provider]  magnesium 30 MG tablet Take 30 mg by mouth 2 (two) times daily.    [provider]  metFORMIN (GLUCOPHAGE) 1000 MG tablet TAKE 1 TABLET (1,000 MG TOTAL) BY MOUTH 2 (TWO) TIMES DAILY WITH MEALS. 06/17/15   [provider]  sildenafil (REVATIO) 20 MG tablet Take 1 tablet (20 mg total) by mouth as needed (2-5 tablets daily as needed). Take 2- 5 tabs PO  daily prn 03/20/17   Sebastian AcheManny, Theodore, MD  sulfamethoxazole-trimethoprim (BACTRIM DS,SEPTRA DS) 800-160 MG tablet Take 1 tablet by mouth 2 (two) times daily. 09/25/18   Joni ReiningSmith,  K, PA-C  tamsulosin (FLOMAX) 0.4 MG CAPS capsule Take 1 capsule (0.4 mg total) by mouth daily. 03/20/17   Sebastian AcheManny, Theodore, MD  traMADol (ULTRAM) 50 MG tablet Take 1 tablet (50 mg total) by mouth every 12 (twelve) hours as needed. 09/25/18   Joni ReiningSmith,  K, PA-C  zinc gluconate 50 MG tablet Take 50 mg by mouth daily.    [provider]    Allergies Sitagliptin  Family History  Problem Relation  Age of Onset  . Prostate cancer Father   . Hematuria Neg Hx   . Renal cancer Neg Hx     Social History Social History   Tobacco Use  . Smoking status: Never Smoker  . Smokeless tobacco: Never Used  Substance Use Topics  . Alcohol use: No    Alcohol/week: 0.0 standard drinks  . Drug use: No    Review of Systems  Constitutional: No fever/chills Eyes: No visual changes. ENT: No sore throat. Cardiovascular: Denies chest pain. Respiratory: Denies shortness of breath. Gastrointestinal: No abdominal pain.  No nausea, no vomiting.  No diarrhea.  No constipation. Genitourinary: Negative for dysuria. Musculoskeletal: Left third finger deformity Skin: Negative for rash.  Laceration distal phalange Neurological: Negative for headaches, focal weakness or numbness. Endocrine:  Diabetes, hyperlipidemia, and hypertension. Allergic/Immunilogical: Stagliptin ____________________________________________   PHYSICAL EXAM:  VITAL SIGNS: ED Triage Vitals  Enc Vitals Group     BP 09/25/18 1131 (!) 170/70     Pulse Rate 09/25/18 1131 79     Resp 09/25/18 1131 16     Temp 09/25/18 1131 (!) 97.4 F (36.3 C)     Temp Source 09/25/18 1131 Oral     SpO2 09/25/18 1131 100 %     Weight 09/25/18 1132 230 lb (104.3 kg)     Height 09/25/18 1132 5\' 8"  (1.727 m)     Head Circumference --      Peak Flow --      Pain Score 09/25/18 1132 8     Pain Loc --      Pain Edu? --      Excl. in GC? --     Constitutional: Alert and oriented. Well appearing and in no acute distress. Cardiovascular: Normal rate, regular rhythm. Grossly normal heart sounds.  Good peripheral circulation.  Evaded blood pressure Respiratory: Normal respiratory effort.  No retractions. Lungs CTAB. Gastrointestinal: Soft and nontender. No distention. No abdominal bruits. No CVA tenderness. Musculoskeletal: Deformity to the DIPJ third digit left hand.   Neurologic:  Normal speech and language. No gross focal neurologic deficits  are appreciated. No gait instability. Skin: Laceration distal phalanges third digit left hand. Psychiatric: Mood and affect are normal. Speech and behavior are normal.  ____________________________________________   LABS (all labs ordered are listed, but only abnormal results are displayed)  Labs Reviewed - No data to display ____________________________________________  EKG   ____________________________________________  RADIOLOGY  ED MD interpretation:  Official radiology report(s): Dg Finger Middle Left  Result Date: 09/25/2018 CLINICAL DATA:  Post reduction EXAM: LEFT MIDDLE FINGER 2+V COMPARISON:  09/25/2018 FINDINGS: Dorsal dislocation of the DIP of the middle finger is unchanged in position. Comminuted fracture of the tuft of the distal phalanx is unchanged. Interval placement of bandage obscuring bony detail IMPRESSION: Little change from earlier today with persistent dorsal dislocation of the DIP  joint. Comminuted fracture of the tuft of the distal third phalanx. Electronically Signed   By: Marlan Palau M.D.   On: 09/25/2018 13:39   Dg Finger Middle Left  Result Date: 09/25/2018 CLINICAL DATA:  Laceration to finger. EXAM: LEFT MIDDLE FINGER 2+V COMPARISON:  None. FINDINGS: There is a comminuted displaced fracture through the distal tuft of the third finger. There is also a dislocation at the distal interphalangeal joint. Evaluation for foreign bodies in the region laceration is limited due to overlapping gauze/dressings and displaced fracture fragments. No definitive foreign bodies noted. However, repeat imaging after removal of the dressings is recommended. There is a focus of high attenuation adjacent to the proximal aspect of the middle phalanx which is an age-indeterminate foreign body. This is probably nonacute given its location. IMPRESSION: 1. Comminuted displaced fracture through the distal third tuft. Evaluation for foreign bodies in the region of laceration is limited  as above. Recommend repeat imaging after the dressings were removed. 2. Tiny foreign body adjacent to the proximal middle phalanx, likely nonacute given location. Recommend clinical correlation. Electronically Signed   By: Gerome Sam III M.D   On: 09/25/2018 12:43    ____________________________________________   PROCEDURES  Procedure(s) performed: None  .Splint Application Date/Time: 09/25/2018 1:19 PM Performed by: Greig Castilla, NT Authorized by: Joni Reining, PA-C   Consent:    Consent obtained:  Verbal   Consent given by:  Patient   Risks discussed:  Pain and swelling Pre-procedure details:    Sensation:  Normal Procedure details:    Laterality:  Left   Location:  Finger   Finger:  L long finger   Cast type:  Finger   Supplies:  Aluminum splint Post-procedure details:    Pain:  Improved    Critical Care performed: No  ____________________________________________   INITIAL IMPRESSION / ASSESSMENT AND PLAN / ED COURSE  As part of my medical decision making, I reviewed the following data within the electronic MEDICAL RECORD NUMBER     Patient presents with deformity and laceration to the third digit left hand.  Patient x-ray reveal open tuft fracture with subluxation at the DIPJ.  Discussed x-ray findings with patient.  Subluxation was reduced and verified post x-ray.  Laceration was cleaned and bandaged.  Patient finger was splinted.  Patient given discharge care instructions.  Patient given antibiotics and pain medication.  Patient will follow-up with orthopedics by calling for an appointment.      ____________________________________________   FINAL CLINICAL IMPRESSION(S) / ED DIAGNOSES  Final diagnoses:  Open fracture of tuft of distal phalanx of finger  Closed fracture subluxation of finger, initial encounter     ED Discharge Orders         Ordered    sulfamethoxazole-trimethoprim (BACTRIM DS,SEPTRA DS) 800-160 MG tablet  2 times daily      09/25/18 1336    traMADol (ULTRAM) 50 MG tablet  Every 12 hours PRN     09/25/18 1336           Note:  This document was prepared using Dragon voice recognition software and may include unintentional dictation errors.    Joni Reining, PA-C 09/25/18 1400    Don Perking [place], MD 09/25/18 [phone]

## 2018-10-10 ENCOUNTER — Ambulatory Visit: Payer: Medicare Other | Admitting: Urology

## 2018-10-18 ENCOUNTER — Ambulatory Visit: Payer: Medicare Other | Admitting: Urology

## 2018-10-18 NOTE — Progress Notes (Incomplete)
10/18/2018  1:18 PM   Gene Brown 11-17-1950 [id number]  Referring provider: Barbette Reichmann, MD [address] [facility] [place], [place] [address]  No chief complaint on file.  Urologic history: 1.  Elevated PSA -Biopsy November 2016; PSA 5.5 with benign pathology  2.  BPH with incomplete bladder emptying -On combination therapy tamsulosin/finasteride -PVR ~ 200 mL  HPI: Gene Brown is a 68 yo M who returns today for the evaluation and management for the above urological history.  -Most recent PSA 1.9 from 6 months ago  -Remains on tamsulosin and finasteride     PMH: Past Medical History:  Diagnosis Date   Arthritis 06/25/2015   BP (high blood pressure) 06/25/2015   H/O varicella 06/25/2015   H/O: gout 06/25/2015   Sickle cell trait (HCC)    Type 2 diabetes mellitus (HCC) 06/25/2015    Surgical History: Past Surgical History:  Procedure Laterality Date   ANTERIOR CRUCIATE LIGAMENT REPAIR Left 1982   2004-2005. HAS HAD 3 OF SAME SURGERY   GASTRIC BYPASS  2006   INGUINAL HERNIA REPAIR     MENISCUS REPAIR Left 2014   SCREW IN KNEE   TONSILLECTOMY AND ADENOIDECTOMY     UMBILICAL HERNIA REPAIR N/A 10/05/2017   Procedure: HERNIA REPAIR UMBILICAL ADULT;  Surgeon: Nadeen Landau, MD;  Location: [facility] ORS;  Service: General;  Laterality: N/A;   VASECTOMY      Home Medications:  Allergies as of 10/18/2018      Reactions   Sitagliptin Other (See Comments)   Extreme family history of cancer      Medication List       Accurate as of October 18, 2018  1:18 PM. Always use your most recent med list.        co-enzyme Q-10 30 MG capsule Take 30 mg by mouth 3 (three) times daily.   finasteride 5 MG tablet Commonly known as:  PROSCAR Take 1 tablet (5 mg total) by mouth daily.   glipiZIDE 5 MG tablet Commonly known as:  GLUCOTROL Take 5 mg by mouth 2 (two) times daily before a meal.   HYDROcodone-acetaminophen 5-325 MG  tablet Commonly known as:  Norco Take 1-2 tablets by mouth every 6 (six) hours as needed for moderate pain.   Lantus 100 UNIT/ML injection Generic drug:  insulin glargine ADM 20 UNITS [place] QD   lisinopril 40 MG tablet Commonly known as:  PRINIVIL,ZESTRIL IN THE MORNING   magnesium 30 MG tablet Take 30 mg by mouth 2 (two) times daily.   metFORMIN 1000 MG tablet Commonly known as:  GLUCOPHAGE TAKE 1 TABLET (1,000 MG TOTAL) BY MOUTH 2 (TWO) TIMES DAILY WITH MEALS.   RA Vitamin B-12 TR 1000 MCG Tbcr Generic drug:  Cyanocobalamin Take by mouth. IN THE MORNING   sildenafil 20 MG tablet Commonly known as:  REVATIO Take 1 tablet (20 mg total) by mouth as needed (2-5 tablets daily as needed). Take 2- 5 tabs PO daily prn   sulfamethoxazole-trimethoprim 800-160 MG tablet Commonly known as:  BACTRIM DS,SEPTRA DS Take 1 tablet by mouth 2 (two) times daily.   tamsulosin 0.4 MG Caps capsule Commonly known as:  FLOMAX Take 1 capsule (0.4 mg total) by mouth daily.   traMADol 50 MG tablet Commonly known as:  Ultram Take 1 tablet (50 mg total) by mouth every 12 (twelve) hours as needed.   Vitamin D-1000 Max St 25 MCG (1000 UT) tablet Generic drug:  Cholecalciferol Take by  mouth. IN MORNING   zinc gluconate 50 MG tablet Take 50 mg by mouth daily.       Allergies:  Allergies  Allergen Reactions   Sitagliptin Other (See Comments)    Extreme family history of cancer    Family History: Family History  Problem Relation Age of Onset   Prostate cancer Father    Hematuria Neg Hx    Renal cancer Neg Hx     Social History:  reports that he has never smoked. He has never used smokeless tobacco. He reports that he does not drink alcohol or use drugs.  ROS:                                        Physical Exam: There were no vitals taken for this visit.  Constitutional:  Alert and oriented, No acute distress. HEENT: [place] AT, moist mucus membranes.   Trachea midline, no masses. Cardiovascular: No clubbing, cyanosis, or edema. Respiratory: Normal respiratory effort, no increased work of breathing. GI: Abdomen is soft, nontender, nondistended, no abdominal masses GU: No CVA tenderness Lymph: No cervical or inguinal lymphadenopathy. Skin: No rashes, bruises or suspicious lesions. Neurologic: Grossly intact, no focal deficits, moving all 4 extremities. Psychiatric: Normal mood and affect.  Laboratory Data: No results found for: WBC, HGB, HCT, MCV, PLT  Lab Results  Component Value Date   CREATININE 0.90 09/14/2017    No results found for: PSA  No results found for: TESTOSTERONE  No results found for: HGBA1C  Urinalysis    Component Value Date/Time   APPEARANCEUR Cloudy (A) 11/22/2017 1119   GLUCOSEU 3+ (A) 11/22/2017 1119   BILIRUBINUR Negative 11/22/2017 1119   PROTEINUR 3+ (A) 11/22/2017 1119   NITRITE Negative 11/22/2017 1119   LEUKOCYTESUR 1+ (A) 11/22/2017 1119    Lab Results  Component Value Date   LABMICR See below: 11/22/2017   WBCUA 11-30 (A) 11/22/2017   RBCUA 11-30 (A) 11/22/2017   LABEPIT None seen 11/22/2017   MUCUS Present (A) 11/22/2017   BACTERIA Moderate (A) 11/22/2017    Pertinent Imaging: *** Results for orders placed during the hospital encounter of 11/22/17  Abdomen 1 view (KUB)   Narrative CLINICAL DATA:  Lower abdomen pain, microhematuria  EXAM: ABDOMEN - 1 VIEW  COMPARISON:  CT abdomen pelvis of 09/14/2017  FINDINGS: Supine views the abdomen show no bowel obstruction. There is a moderate amount of feces throughout the colon. Mesh overlies the mid abdomen for repair of prior ventral hernia. No opaque calculi are seen. The bones are unremarkable. Sutures are noted in the left upper quadrant from prior gastric surgery.  IMPRESSION: 1. No bowel obstruction. Moderate amount of feces throughout the colon. 2. Mesh from prior ventral hernia repair. 3. No opaque  calculi.   Electronically Signed   By: Dwyane DeePaul  Barry M.D.   On: 11/22/2017 16:17    No results found for this or any previous visit. No results found for this or any previous visit. No results found for this or any previous visit. No results found for this or any previous visit. No results found for this or any previous visit. No results found for this or any previous visit. No results found for this or any previous visit.  Assessment & Plan:    @DIAGMED @  No follow-ups on file.  Donne HazelNethusan Sivanesan  [facility] [address], [address] [place], [place]  [address] [phone]) [phone]  I, Donne Hazel, am acting as a scribe for Dr. Lorin Picket C. Stoioff,  {Add Holiday representative

## 2019-05-06 ENCOUNTER — Other Ambulatory Visit: Payer: Self-pay

## 2019-05-06 ENCOUNTER — Encounter (INDEPENDENT_AMBULATORY_CARE_PROVIDER_SITE_OTHER): Payer: Self-pay

## 2019-05-06 ENCOUNTER — Ambulatory Visit
Admission: RE | Admit: 2019-05-06 | Discharge: 2019-05-06 | Disposition: A | Discharge status: Home / Self Care | Payer: Medicare Other | Source: Ambulatory Visit | Attending: Internal Medicine | Admitting: Internal Medicine

## 2019-05-06 ENCOUNTER — Other Ambulatory Visit: Payer: Self-pay | Admitting: Internal Medicine

## 2019-05-06 DIAGNOSIS — R600 Localized edema: Secondary | ICD-10-CM

## 2020-02-11 IMAGING — DX DG FINGER MIDDLE 2+V*L*
3 series · 3 of 3 positions shown · non-contrast
Comparison: Two separate studies earlier today.

CLINICAL DATA: Status post reduction of left second finger DIP
dislocation after table saw injury.

EXAM:
LEFT MIDDLE FINGER 2+V

[finger ap]
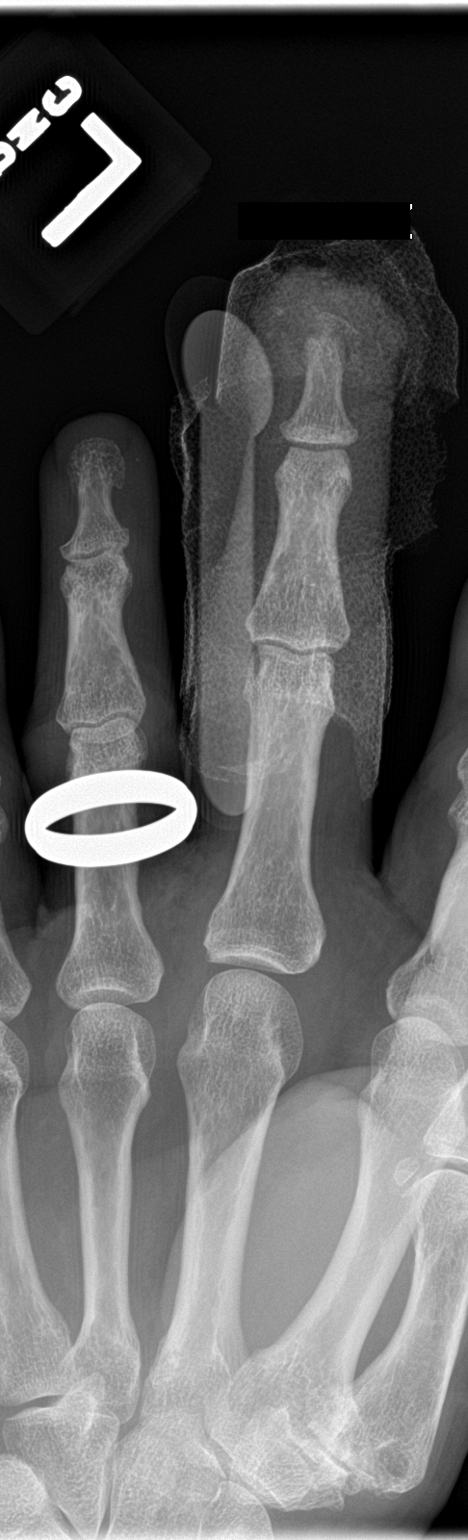

[finger obl]
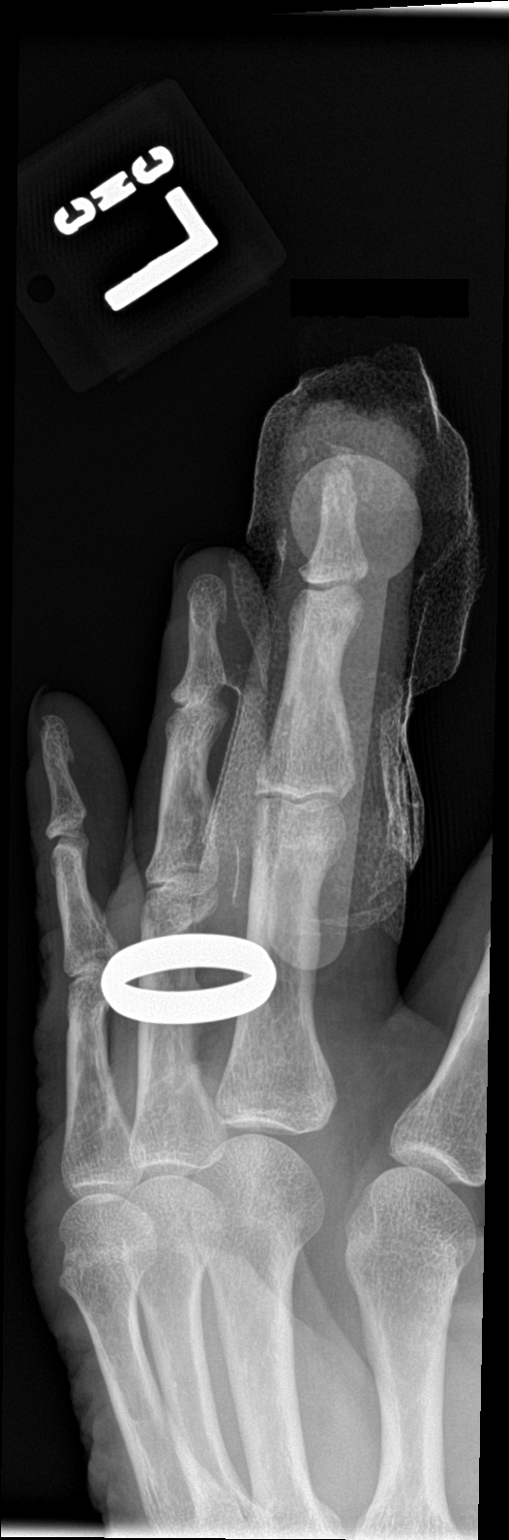

[finger lat]
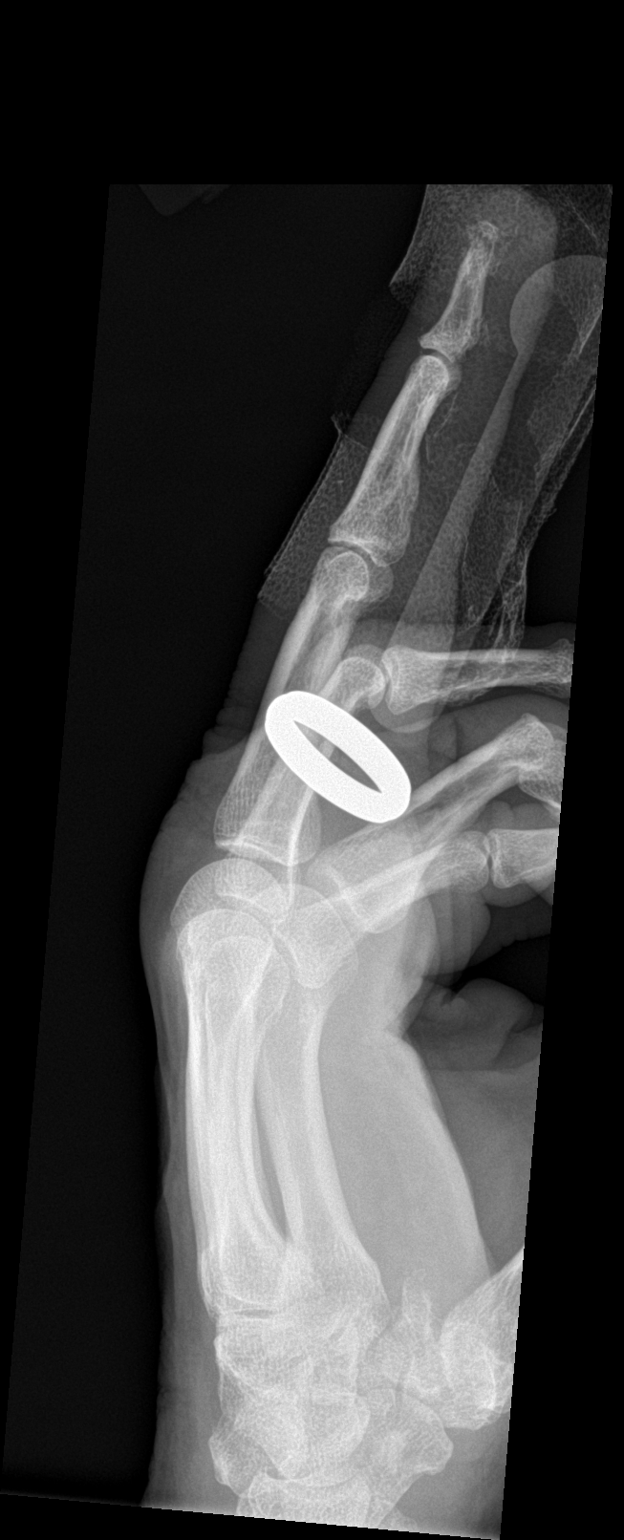

[3 of 3 positions shown; findings below may reference images not displayed]

FINDINGS: Alignment at the DIP joint is now normal after successful reduction
of dislocation. No change in soft tissue injury and fracture of the
tuft of the distal phalanx.
IMPRESSION: Reduction left third finger DIP dislocation with normal alignment
now present. No change in soft tissue injury and fracture of the
tuft of the distal phalanx.

## 2021-01-21 LAB — EXTERNAL GENERIC LAB PROCEDURE: COLOGUARD: NEGATIVE

## 2021-05-13 ENCOUNTER — Ambulatory Visit (INDEPENDENT_AMBULATORY_CARE_PROVIDER_SITE_OTHER): Payer: Medicare Other | Admitting: Surgery

## 2021-05-13 ENCOUNTER — Encounter: Payer: Self-pay | Admitting: Surgery

## 2021-05-13 ENCOUNTER — Other Ambulatory Visit: Payer: Self-pay

## 2021-05-13 VITALS — BP 180/82 | HR 86 | Temp 97.9°F | Ht 68.0 in | Wt 253.8 lb

## 2021-05-13 DIAGNOSIS — R1031 Right lower quadrant pain: Secondary | ICD-10-CM

## 2021-05-13 NOTE — Patient Instructions (Addendum)
We did not find a recurrent hernia on exam.  Call us for a follow up if the area gets worse.   Follow-up with our office as needed.  Please call and ask to speak with a nurse if you develop questions or concerns.

## 2021-05-13 NOTE — Progress Notes (Signed)
Patient ID: Gene Brown, male   DOB: 1950-09-16, 70 y.o.   MRN: [id number]  Chief Complaint: Right groin pain  History of Present Illness Gene Brown is a 70 y.o. male with right groin discomfort that reminds him of his prior right inguinal hernia, typically at its worst in the morning when he gets up.  He sleeps in a chair, essentially never flat on his back.  He reports the pain eases after being up and around.  No known bulge in the groin area, no sense of shift in the soft tissues there.  His right inguinal hernia repair was in the 1980s.  He has a number of other medical/surgical issues going on with aches and pains musculoskeletal primarily.  Essentially felt the need to have it ruled out that he could have a recurrence.  Past Medical History Past Medical History:  Diagnosis Date   Arthritis 06/25/2015   BP (high blood pressure) 06/25/2015   H/O varicella 06/25/2015   H/O: gout 06/25/2015   Hyperlipidemia    Lymphedema    Sickle cell trait (HCC)    Type 2 diabetes mellitus (HCC) 06/25/2015      Past Surgical History:  Procedure Laterality Date   ANTERIOR CRUCIATE LIGAMENT REPAIR Left 1982   2004-2005. HAS HAD 3 OF SAME SURGERY   GASTRIC BYPASS  2006   INGUINAL HERNIA REPAIR     INGUINAL HERNIA REPAIR Left 1980   MENISCUS REPAIR Left 2014   SCREW IN KNEE   TONSILLECTOMY AND ADENOIDECTOMY     UMBILICAL HERNIA REPAIR N/A 10/05/2017   Procedure: HERNIA REPAIR UMBILICAL ADULT;  Surgeon: Nadeen Landau, MD;  Location: [facility] ORS;  Service: General;  Laterality: N/A;   VASECTOMY      Allergies  Allergen Reactions   Sitagliptin Other (See Comments)    Extreme family history of cancer   Statins Other (See Comments)    Muscle weakness     Current Outpatient Medications  Medication Sig Dispense Refill   Cholecalciferol 25 MCG (1000 UT) tablet Take by mouth. IN MORNING     co-enzyme Q-10 30 MG capsule Take 30 mg by mouth 3 (three) times daily.     Cyanocobalamin  1000 MCG TBCR Take by mouth. IN THE MORNING     fenofibrate (TRICOR) 48 MG tablet Take 48 mg by mouth daily.     finasteride (PROSCAR) 5 MG tablet Take 1 tablet (5 mg total) by mouth daily. (Patient taking differently: Take 5 mg by mouth daily. IN THE MORNING) 90 tablet 3   furosemide (LASIX) 20 MG tablet Take 20 mg by mouth 2 (two) times daily as needed.     gabapentin (NEURONTIN) 400 MG capsule Take 400 mg by mouth 3 (three) times daily.     glipiZIDE (GLUCOTROL) 5 MG tablet Take 5 mg by mouth 2 (two) times daily before a meal.      LANTUS 100 UNIT/ML injection ADM 20 UNITS [place] QD  5   lisinopril (PRINIVIL,ZESTRIL) 40 MG tablet IN THE MORNING     metFORMIN (GLUCOPHAGE) 1000 MG tablet TAKE 1 TABLET (1,000 MG TOTAL) BY MOUTH 2 (TWO) TIMES DAILY WITH MEALS.  1   tamsulosin (FLOMAX) 0.4 MG CAPS capsule Take 1 capsule (0.4 mg total) by mouth daily. 90 capsule 3   magnesium 30 MG tablet Take 30 mg by mouth 2 (two) times daily. (Patient not taking: Reported on 05/13/2021)     zinc gluconate 50 MG tablet Take 50 mg by mouth daily. (  Patient not taking: Reported on 05/13/2021)     No current facility-administered medications for this visit.    Family History Family History  Problem Relation Age of Onset   Prostate cancer Father    Hematuria Neg Hx    Renal cancer Neg Hx       Social History Social History   Tobacco Use   Smoking status: Never   Smokeless tobacco: Never  Vaping Use   Vaping Use: Never used  Substance Use Topics   Alcohol use: No    Alcohol/week: 0.0 standard drinks   Drug use: No        Review of Systems  Constitutional: Negative.   HENT: Negative.    Eyes: Negative.   Respiratory: Negative.    Cardiovascular: Negative.   Gastrointestinal: Negative.   Genitourinary: Negative.   Musculoskeletal:  Positive for joint pain.  Skin: Negative.   Neurological: Negative.   Psychiatric/Behavioral: Negative.       Physical Exam Blood pressure (!) 180/82, pulse 86,  temperature 97.9 F (36.6 C), height 5\' 8"  (1.727 m), weight 253 lb 12.8 oz (115.1 kg), SpO2 97 %. Last Weight  Most recent update: 05/13/2021  2:38 PM    Weight  115.1 kg (253 lb 12.8 oz)             CONSTITUTIONAL: Well developed, and nourished, appropriately responsive and aware without distress.  Obese gentleman.   EYES: Sclera non-icteric.   EARS, NOSE, MOUTH AND THROAT: Mask worn.  Hearing is intact to voice.  NECK: Trachea is midline, and there is no appreciable jugular venous distension.  LYMPH NODES: No cervical masses. RESPIRATORY:  Lungs are clear, and breath sounds are equal bilaterally. Normal respiratory effort without pathologic use of accessory muscles. CARDIOVASCULAR: Heart is regular in rate and rhythm. GI: The abdomen is well rounded, midline infraumbilical scar without appreciable masses or fascial defect.  Otherwise soft, nontender, and nondistended. I did not appreciate hepatosplenomegaly. There were normal bowel sounds. GU: Focused evaluation of the right inguinal region, I find no evidence of hernia sac or change with Valsalva present. MUSCULOSKELETAL:  Symmetrical muscle tone appreciated in all four extremities.    SKIN: Skin turgor is normal. No pathologic skin lesions appreciated.  NEUROLOGIC:  Motor and sensation appear grossly normal.  Cranial nerves are grossly without defect. PSYCH:  Alert and oriented to person, place and time. Affect is appropriate for situation.  Data Reviewed I have personally reviewed what is currently available of the patient's imaging, recent labs and medical records.   Labs:  No flowsheet data found. CMP Latest Ref Rng & Units 09/14/2017 06/23/2016 11/30/2015  BUN 6 - 20 mg/dL - - 16  Creatinine 12/02/2015 - 1.24 mg/dL 9.41 7.40 8.14      Imaging:  Within last 24 hrs: No results found.  Assessment    Suspected recurrence of right inguinal hernia.  Not appreciated on clinical exam. Patient Active Problem List   Diagnosis Date  Noted   Elevated PSA 01/24/2017   Nocturia 01/24/2017   Erectile dysfunction due to arterial insufficiency 01/24/2017   Arthritis 06/25/2015   H/O varicella 06/25/2015   H/O: gout 06/25/2015   H/O nutritional disorder 06/25/2015   HLD (hyperlipidemia) 06/25/2015   BP (high blood pressure) 06/25/2015   Sickle cell trait (HCC) 06/25/2015   Type 2 diabetes mellitus (HCC) 06/25/2015   Low serum vitamin D 06/17/2015    Plan    Offered pursuit of additional imaging to ensure the absence of inguinal  hernia, not felt of sufficient priority considering multiple other core morbidities and further evaluation/work-up to be completed.  Be glad to see this gentleman again should this persist or worsen.  Especially if he develops a groin bulge.  Face-to-face time spent with the patient and accompanying care providers(if present) was 40 minutes, with more than 50% of the time spent counseling, educating, and coordinating care of the patient.    These notes generated with voice recognition software. I apologize for typographical errors.  Campbell Lerner M.D., FACS 05/13/2021, 3:59 PM

## 2022-01-07 ENCOUNTER — Encounter: Payer: Medicare Other | Attending: Physician Assistant | Admitting: Physician Assistant

## 2022-01-07 DIAGNOSIS — E11621 Type 2 diabetes mellitus with foot ulcer: Secondary | ICD-10-CM | POA: Diagnosis present

## 2022-01-07 DIAGNOSIS — E1142 Type 2 diabetes mellitus with diabetic polyneuropathy: Secondary | ICD-10-CM | POA: Diagnosis not present

## 2022-01-07 DIAGNOSIS — L97522 Non-pressure chronic ulcer of other part of left foot with fat layer exposed: Secondary | ICD-10-CM | POA: Diagnosis not present

## 2022-01-07 NOTE — Progress Notes (Addendum)
ESSEX, HABA ([id number]) Visit Report for 01/07/2022 Chief Complaint Document Details Patient Name: Gene Brown, Gene Brown Date of Service: 01/07/2022 12:45 PM Medical Record Number: [id number] Patient Account Number: [id number] Date of Birth/Sex: 1950-10-26 (71 y.o. M) Treating RN: Cornell Barman Primary Care Provider: Tracie Harrier Other Clinician: Referring Provider: Caroline More Treating Provider/Extender: Skipper Cliche in Treatment: 0 Information Obtained from: Patient Chief Complaint Left great toe ulcer Electronic Signature(s) Signed: 01/07/2022 1:33:10 PM By: Worthy Keeler PA-C Entered By: Worthy Keeler on 01/07/2022 13:33:10 Gene Brown ([id number]) -------------------------------------------------------------------------------- Debridement Details Patient Name: Gene Brown Date of Service: 01/07/2022 12:45 PM Medical Record Number: [id number] Patient Account Number: [id number] Date of Birth/Sex: [birth year] (71 y.o. M) Treating RN: Cornell Barman Primary Care Provider: Tracie Harrier Other Clinician: Referring Provider: Caroline More Treating Provider/Extender: Skipper Cliche in Treatment: 0 Debridement Performed for Wound #2 Left,Plantar Toe Great Assessment: Performed By: Physician Tommie Sams., PA-C Debridement Type: Debridement Severity of Tissue Pre Debridement: Fat layer exposed Level of Consciousness (Pre- Awake and Alert procedure): Pre-procedure Verification/Time Out Yes - 13:35 Taken: Total Area Debrided (L x W): 0.6 (cm) x 0.6 (cm) = 0.36 (cm) Tissue and other material Viable, Non-Viable, Callus, Slough, Subcutaneous, Slough debrided: Level: Skin/Subcutaneous Tissue Debridement Description: Excisional Instrument: Curette Bleeding: Minimum Hemostasis Achieved: Pressure Response to Treatment: Procedure was tolerated well Level of Consciousness (Post- Awake and Alert procedure): Post Debridement Measurements of Total Wound Length:  (cm) 0.7 Width: (cm) 0.7 Depth: (cm) 0.5 Volume: (cm) 0.192 Character of Wound/Ulcer Post Debridement: Stable Severity of Tissue Post Debridement: Fat layer exposed Post Procedure Diagnosis Same as Pre-procedure Electronic Signature(s) Signed: 01/07/2022 3:55:46 PM By: [name], BSN, RN, CWS, Kim RN, BSN Signed: 01/07/2022 4:36:57 PM By: Worthy Keeler PA-C Entered By: [name], BSN, RN, CWS, Kim on 01/07/2022 13:40:37 Gene Brown ([id number]) -------------------------------------------------------------------------------- HPI Details Patient Name: Gene Brown Date of Service: 01/07/2022 12:45 PM Medical Record Number: [id number] Patient Account Number: [id number] Date of Birth/Sex: [birth year] (71 y.o. M) Treating RN: Cornell Barman Primary Care Provider: Tracie Harrier Other Clinician: Referring Provider: Caroline More Treating Provider/Extender: Skipper Cliche in Treatment: 0 History of Present Illness HPI Description: 05/24/16; this is a 71 year old type II diabetic who probably has some degree of polyneuropathy. He tells me that he has had an area on the plantar aspect of his left great toe that is been followed by podiatry for most the last year. He has a lot of callus, the podiatrist which shave this down put a dressing on and he would follow pad monthly or sometimes longer intervals. He has not noted any drainage or pain. He has not had a recent x-ray. No serious attempt at offloading this with modified footwear.. Last hemoglobin A1c that he knows about was over 10 a month ago he is working hard on this with his primary physician. More recently he has been soaking this with peroxide ABIs are noncompressible and the left leg. He was told by his podiatrist he has a bone spur underneath the wound but did not wish any surgical procedures. He has a caregiver at home for a very disabled wife, the nature of her illness is not certain [id number] patient comes in much the same as last  week amounts of callus nonviable subcutaneous tissue which required debridement. His x-ray suggested cortical bone irregularity. He will need an MRI. He tells me he has old screws/hardware left knee however he thinks he has had a subsequent MRI after this 06/07/16  at this point in time patient has been attempting to offload his wound as much as he could on his own. He has made a conscious effort to avoid placing any pressure on the toe as he was moving around and walking and I do believe this has payed off and what I am seeing at this point in time. overall the wound appears to be somewhat improved in my opinion he is not having any significant discomfort at this point in time. In fact he tells me he has no pain at all. There is no interval sign of surrounding infection in the wound itself has actually decreased in size. Unfortunately the MRI was not ordered last week and therefore we do need to proceed with the MRI order today. again this is to evaluate the cortical bone irregularity noted on x-ray. 06/14/16 Patient has continued to use the offloading shoe at this point in time. He tells me this is somewhat difficult to get used to walking and but nonetheless he is very strict about wearing it. Again I believe this has paid off very well for him and his wound appears to be greatly improved. He does have some callused area surrounding the wound proximally that has caused some fluid collection of maceration of the callused area. This likely will require debridement today. Some adherent slough noted over the wound bed. 06/21/16; patient's wound on the plantar aspect of his left great toe is totally epithelialized. His MRI of the foot is tomorrow to follow-up on the cortical irregularity noted on plain x-ray. He does not have diabetic foot where 06-28-16-He comes in today with a continued healed left great toe. MRI performed on 06/23/2016 shows no abscess osteomyelitis or septic joint. Patient was  discharged from wound care center today. Readmission: 01-07-2022 upon evaluation today patient appears to be doing somewhat poorly in regard to total ulcer on his left foot. Fortunately there does not appear to be any signs of infection which is great news. Has been seen by Dr. Luana Shu and he did request from Dr. Luana Shu to come see [place] due to the fact that in the past several years back we were able to get one of his wounds healed about 4 weeks. Again I explained that that is always variable but nonetheless I definitely think we can help. Fortunately I see no evidence of infection. He does have a foot offloading shoe which I think is good to attempt for now he was able to get this healed before with a peg assist. Patient does have a history of type 2 diabetes, diabetic polyneuropathy, and hypertension. This current wound started on 12-09-2021. Electronic Signature(s) Signed: 01/07/2022 2:29:25 PM By: Worthy Keeler PA-C Entered By: Worthy Keeler on 01/07/2022 14:29:25 Gene Brown ([id number]) -------------------------------------------------------------------------------- Physical Exam Details Patient Name: Gene Brown Date of Service: 01/07/2022 12:45 PM Medical Record Number: [id number] Patient Account Number: [id number] Date of Birth/Sex: [birth year] (71 y.o. M) Treating RN: Cornell Barman Primary Care Provider: Tracie Harrier Other Clinician: Referring Provider: Caroline More Treating Provider/Extender: Skipper Cliche in Treatment: 0 Constitutional patient is hypertensive.. pulse regular and within target range for patient.Marland Kitchen respirations regular, non-labored and within target range for patient.Marland Kitchen temperature within target range for patient.. Well-nourished and well-hydrated in no acute distress. Eyes conjunctiva clear no eyelid edema noted. pupils equal round and reactive to light and accommodation. Ears, Nose, Mouth, and Throat no gross abnormality of ear auricles or external  auditory canals. normal hearing noted during conversation. mucus membranes  moist. Respiratory normal breathing without difficulty. Cardiovascular 2+ dorsalis pedis/posterior tibialis pulses. no clubbing, cyanosis, significant edema, <3 sec cap refill. Musculoskeletal normal gait and posture. no significant deformity or arthritic changes, no loss or range of motion, no clubbing. Psychiatric this patient is able to make decisions and demonstrates good insight into disease process. Alert and Oriented x 3. pleasant and cooperative. Notes Patient is working with his primary care provider on his hypertension. Patient's wound bed actually showed signs of quite a bit of callus buildup that we do need to clear away. Is been a couple of weeks since Dr. Luana Shu debrided this. Subsequently I did perform debridement clearing away the majority of the callus and slough postdebridement this appears to be doing much better which is great news. Electronic Signature(s) Signed: 01/07/2022 2:34:24 PM By: Worthy Keeler PA-C Entered By: Worthy Keeler on 01/07/2022 14:34:24 Gene Brown ([id number]) -------------------------------------------------------------------------------- Physician Orders Details Patient Name: Gene Brown Date of Service: 01/07/2022 12:45 PM Medical Record Number: [id number] Patient Account Number: [id number] Date of Birth/Sex: 1951-01-15 (71 y.o. M) Treating RN: Cornell Barman Primary Care Provider: Tracie Harrier Other Clinician: Referring Provider: Caroline More Treating Provider/Extender: Skipper Cliche in Treatment: 0 Verbal / Phone Orders: No Diagnosis Coding ICD-10 Coding Code Description E11.621 Type 2 diabetes mellitus with foot ulcer 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osed E11.42 Type 2 diabetes mellitus with diabetic polyneuropathy I10 Essential (primary) hypertension Follow-up Appointments Wound #2 Left,Plantar Toe  Great o Return Appointment in 1 week. o Nurse Visit as needed Bathing/ Shower/ Hygiene o May shower; gently cleanse wound with antibacterial soap, rinse and pat dry prior to dressing wounds o No tub bath. Anesthetic (Use 'Patient Medications' Section for Anesthetic Order Entry) o Lidocaine applied to wound bed Edema Control - Lymphedema / Segmental Compressive Device / Other o Compression Pump: Use compression pump on left lower extremity for 60 minutes, twice daily. o Compression Pump: Use compression pump on right lower extremity for 60 minutes, twice daily. o DO YOUR BEST to sleep in the bed at night. DO NOT sleep in your recliner. Long hours of sitting in a recliner leads to swelling of the legs and/or potential wounds on your backside. Off-Loading o Offloading felt to foot. - donut cut in offloading felt o Other: - front off-loader left Additional Orders / Instructions o Follow Nutritious Diet and Increase Protein Intake o Activity as tolerated Wound Treatment Wound #2 - Toe Great Wound Laterality: Plantar, Left Cleanser: Soap and Water 3 x Per Week/15 Days Discharge Instructions: Gently cleanse wound with antibacterial soap, rinse and pat dry prior to dressing wounds Primary Dressing: Silvercel Small 2x2 (in/in) (DME) (Generic) 3 x Per Week/15 Days Discharge Instructions: Apply Silvercel Small 2x2 (in/in) as instructed Secondary Dressing: Conforming Guaze Roll-Small (DME) (Generic) 3 x Per Week/15 Days Discharge Instructions: Apply Conforming Stretch Guaze Bandage as directed Secured With: Medipore Tape - 2M Medipore H Soft Cloth Surgical Tape, 2x2 (in/yd) (DME) (Generic) 3 x Per Week/15 Days Electronic Signature(s) Signed: 01/07/2022 3:55:46 PM By: [name], BSN, RN, CWS, Kim RN, BSN Signed: 01/07/2022 4:36:57 PM By: Worthy Keeler PA-C Entered By: [name], BSN, RN, CWS, Kim on 01/07/2022 15:07:19 Gene Brown ([id number]) Gene Brown, Gene Brown  ([id number]) -------------------------------------------------------------------------------- Problem List Details Patient Name: Gene Brown Date of Service: 01/07/2022 12:45 PM Medical Record Number: [id number] Patient Account Number: [id number] Date of Birth/Sex: 12-Nov-1950 (71 y.o. M) Treating RN: Cornell Barman Primary Care Provider:  Tracie Harrier Other Clinician: Referring Provider: Caroline More Treating Provider/Extender: Skipper Cliche in Treatment: 0 Active Problems ICD-10 Encounter Code Description Active Date MDM Diagnosis E11.621 Type 2 diabetes mellitus with foot ulcer 01/07/2022 No Yes L97.522 Non-pressure chronic ulcer of other part of left foot with fat layer 01/07/2022 No Yes exposed E11.42 Type 2 diabetes mellitus with diabetic polyneuropathy 01/07/2022 No Yes I10 Essential (primary) hypertension 01/07/2022 No Yes Inactive Problems Resolved Problems Electronic Signature(s) Signed: 01/07/2022 1:33:17 PM By: Worthy Keeler PA-C Previous Signature: 01/07/2022 12:53:45 PM Version By: Worthy Keeler PA-C Entered By: Worthy Keeler on 01/07/2022 13:33:17 Gene Brown ([id number]) -------------------------------------------------------------------------------- Progress Note Details Patient Name: Gene Brown Date of Service: 01/07/2022 12:45 PM Medical Record Number: [id number] Patient Account Number: [id number] Date of Birth/Sex: January 16, 1951 (71 y.o. M) Treating RN: Cornell Barman Primary Care Provider: Tracie Harrier Other Clinician: Referring Provider: Caroline More Treating Provider/Extender: Skipper Cliche in Treatment: 0 Subjective Chief Complaint Information obtained from Patient Left great toe ulcer History of Present Illness (HPI) 05/24/16; this is a 71 year old type II diabetic who probably has some degree of polyneuropathy. He tells me that he has had an area on the plantar aspect of his left great toe that is been followed by podiatry for most the  last year. He has a lot of callus, the podiatrist which shave this down put a dressing on and he would follow pad monthly or sometimes longer intervals. He has not noted any drainage or pain. He has not had a recent x-ray. No serious attempt at offloading this with modified footwear.. Last hemoglobin A1c that he knows about was over 10 a month ago he is working hard on this with his primary physician. More recently he has been soaking this with peroxide ABIs are noncompressible and the left leg. He was told by his podiatrist he has a bone spur underneath the wound but did not wish any surgical procedures. He has a caregiver at home for a very disabled wife, the nature of her illness is not certain [id number] patient comes in much the same as last week amounts of callus nonviable subcutaneous tissue which required debridement. His x-ray suggested cortical bone irregularity. He will need an MRI. He tells me he has old screws/hardware left knee however he thinks he has had a subsequent MRI after this 06/07/16 at this point in time patient has been attempting to offload his wound as much as he could on his own. He has made a conscious effort to avoid placing any pressure on the toe as he was moving around and walking and I do believe this has payed off and what I am seeing at this point in time. overall the wound appears to be somewhat improved in my opinion he is not having any significant discomfort at this point in time. In fact he tells me he has no pain at all. There is no interval sign of surrounding infection in the wound itself has actually decreased in size. Unfortunately the MRI was not ordered last week and therefore we do need to proceed with the MRI order today. again this is to evaluate the cortical bone irregularity noted on x-ray. 06/14/16 Patient has continued to use the offloading shoe at this point in time. He tells me this is somewhat difficult to get used to walking and but nonetheless  he is very strict about wearing it. Again I believe this has paid off very well for him and his wound appears to  be greatly improved. He does have some callused area surrounding the wound proximally that has caused some fluid collection of maceration of the callused area. This likely will require debridement today. Some adherent slough noted over the wound bed. 06/21/16; patient's wound on the plantar aspect of his left great toe is totally epithelialized. His MRI of the foot is tomorrow to follow-up on the cortical irregularity noted on plain x-ray. He does not have diabetic foot where 06-28-16-He comes in today with a continued healed left great toe. MRI performed on 06/23/2016 shows no abscess osteomyelitis or septic joint. Patient was discharged from wound care center today. Readmission: 01-07-2022 upon evaluation today patient appears to be doing somewhat poorly in regard to total ulcer on his left foot. Fortunately there does not appear to be any signs of infection which is great news. Has been seen by Dr. Luana Shu and he did request from Dr. Luana Shu to come see [place] due to the fact that in the past several years back we were able to get one of his wounds healed about 4 weeks. Again I explained that that is always variable but nonetheless I definitely think we can help. Fortunately I see no evidence of infection. He does have a foot offloading shoe which I think is good to attempt for now he was able to get this healed before with a peg assist. Patient does have a history of type 2 diabetes, diabetic polyneuropathy, and hypertension. This current wound started on 12-09-2021. Patient History Information obtained from Patient, Chart. Allergies Statins-Hmg-Coa Reductase Inhibitors Family History Cancer - Father, Lung Disease - Mother, No family history of Diabetes, Heart Disease, Hypertension, Kidney Disease, Seizures, Stroke, Thyroid Problems, Tuberculosis. Social History Never smoker, Marital Status  - Married, Alcohol Use - Rarely, Drug Use - No History, Caffeine Use - Daily. Medical History Eyes Patient has history of Cataracts Denies history of Glaucoma, Optic Neuritis Ear/Nose/Mouth/Throat Denies history of Chronic sinus problems/congestion, Middle ear problems Hematologic/Lymphatic Patient has history of Sickle Cell Disease - Trait Denies history of Anemia, Hemophilia, Human Immunodeficiency Virus, Lymphedema Gene Brown, Gene Brown ([id number]) Respiratory Patient has history of Sleep Apnea Denies history of Aspiration, Asthma, Chronic Obstructive Pulmonary Disease (COPD), Pneumothorax, Tuberculosis Cardiovascular Patient has history of Hypertension Denies history of Angina, Arrhythmia, Congestive Heart Failure, Coronary Artery Disease, Deep Vein Thrombosis, Hypotension, Myocardial Infarction, Peripheral Arterial Disease, Peripheral Venous Disease, Phlebitis, Vasculitis Gastrointestinal Denies history of Cirrhosis , Colitis, Crohn s, Hepatitis A, Hepatitis B, Hepatitis C Endocrine Patient has history of Type II Diabetes Denies history of Type I Diabetes Genitourinary Denies history of End Stage Renal Disease Immunological Denies history of Lupus Erythematosus, Raynaud s, Scleroderma Integumentary (Skin) Denies history of History of Burn, History of pressure wounds Musculoskeletal Patient has history of Gout, Osteoarthritis - knees Denies history of Rheumatoid Arthritis, Osteomyelitis Neurologic Denies history of Dementia, Neuropathy, Quadriplegia, Paraplegia, Seizure Disorder Oncologic Denies history of Received Chemotherapy, Received Radiation Psychiatric Denies history of Anorexia/bulimia Medical And Surgical History Notes Constitutional Symptoms (General Health) Knee surgery x 4; aorta and hernia repair; gastric bypass Review of Systems (ROS) Eyes Complains or has symptoms of Glasses / Contacts. Cardiovascular Complains or has symptoms of LE  edema. Objective Constitutional patient is hypertensive.. pulse regular and within target range for patient.Marland Kitchen respirations regular, non-labored and within target range for patient.Marland Kitchen temperature within target range for patient.. Well-nourished and well-hydrated in no acute distress. Vitals Time Taken: 1:05 PM, Height: 68 in, Weight: 217 lbs, BMI: 33, Temperature: 97.7 F, Pulse: 66 bpm,  Respiratory Rate: 18 breaths/min, Blood Pressure: 182/78 mmHg. Eyes conjunctiva clear no eyelid edema noted. pupils equal round and reactive to light and accommodation. Ears, Nose, Mouth, and Throat no gross abnormality of ear auricles or external auditory canals. normal hearing noted during conversation. mucus membranes moist. Respiratory normal breathing without difficulty. Cardiovascular 2+ dorsalis pedis/posterior tibialis pulses. no clubbing, cyanosis, significant edema, Musculoskeletal normal gait and posture. no significant deformity or arthritic changes, no loss or range of motion, no clubbing. Psychiatric this patient is able to make decisions and demonstrates good insight into disease process. Alert and Oriented x 3. pleasant and cooperative. General Notes: Patient is working with his primary care provider on his hypertension. Patient's wound bed actually showed signs of quite a bit of callus buildup that we do need to clear away. Is been a couple of weeks since Dr. Luana Shu debrided this. Subsequently I did perform debridement clearing away the majority of the callus and slough postdebridement this appears to be doing much better which is great news. Gene Brown, Gene Brown ([id number]) Integumentary (Hair, Skin) Wound #2 status is Open. Original cause of wound was Gradually Appeared. The date acquired was: 12/09/2021. The wound is located on the [employer]. The wound measures 0.6cm length x 0.6cm width x 0.5cm depth; 0.283cm^2 area and 0.141cm^3 volume. There is Fat Layer (Subcutaneous Tissue)  exposed. There is no tunneling noted, however, there is undermining starting at 11:00 and ending at 4:00 with a maximum distance of 0.6cm. There is a medium amount of serous drainage noted. The wound margin is flat and intact. There is large (67-100%) granulation within the wound bed. There is a small (1-33%) amount of necrotic tissue within the wound bed including Adherent Slough. General Notes: Callus surrounding wound and covering most of toe's plantar surface. Assessment Active Problems ICD-10 Type 2 diabetes mellitus with foot ulcer Non-pressure chronic ulcer of other part of left foot with fat layer exposed Type 2 diabetes mellitus with diabetic polyneuropathy Essential (primary) hypertension Procedures Wound #2 Pre-procedure diagnosis of Wound #2 is a Diabetic Wound/Ulcer of the Lower Extremity located on the Left,Plantar Toe Great .Severity of Tissue Pre Debridement is: Fat layer exposed. There was a Excisional Skin/Subcutaneous Tissue Debridement with a total area of 0.36 sq cm performed by Tommie Sams., PA-C. With the following instrument(s): Curette to remove Viable and Non-Viable tissue/material. Material removed includes Callus, Subcutaneous Tissue, and Slough. No specimens were taken. A time out was conducted at 13:35, prior to the start of the procedure. A Minimum amount of bleeding was controlled with Pressure. The procedure was tolerated well. Post Debridement Measurements: 0.7cm length x 0.7cm width x 0.5cm depth; 0.192cm^3 volume. Character of Wound/Ulcer Post Debridement is stable. Severity of Tissue Post Debridement is: Fat layer exposed. Post procedure Diagnosis Wound #2: Same as Pre-Procedure Plan Follow-up Appointments: Wound #2 Left,Plantar Toe Great: Return Appointment in 1 week. Nurse Visit as needed Bathing/ Shower/ Hygiene: May shower; gently cleanse wound with antibacterial soap, rinse and pat dry prior to dressing wounds No tub bath. Anesthetic (Use  'Patient Medications' Section for Anesthetic Order Entry): Lidocaine applied to wound bed Edema Control - Lymphedema / Segmental Compressive Device / Other: Compression Pump: Use compression pump on left lower extremity for 60 minutes, twice daily. Compression Pump: Use compression pump on right lower extremity for 60 minutes, twice daily. DO YOUR BEST to sleep in the bed at night. DO NOT sleep in your recliner. Long hours of sitting in a recliner leads to swelling of  the legs and/or potential wounds on your backside. Off-Loading: Offloading felt to foot. - donut cut in offloading felt Other: - front off-loader left Additional Orders / Instructions: Follow Nutritious Diet and Increase Protein Intake Activity as tolerated WOUND #2: - Toe Great Wound Laterality: Plantar, Left Cleanser: Soap and Water Discharge Instructions: Gently cleanse wound with antibacterial soap, rinse and pat dry prior to dressing wounds Primary Dressing: Silvercel Small 2x2 (in/in) Discharge Instructions: Apply Silvercel Small 2x2 (in/in) as instructed Secondary Dressing: Conforming Guaze Roll-Small Discharge Instructions: Apply Conforming Stretch Guaze Bandage as directed Secured With: Medipore Tape - 20M Medipore H Soft Cloth Surgical Tape, 2x2 (in/yd) Gene Brown, Gene V. ([id number]) 1. I would recommend that we initiate treatment with a silver alginate dressing I think that is probably can to be our best way to go and the patient is in agreement with that plan. 2. I am also can recommend that we have the patient continue with the front offloading shoe which I think is good. I would consider a total contact cast as well if the shoe does not work. 3. And also do recommend we have the patient continue with the dressing changes 3 times per week. We will see patient back for reevaluation in 1 week here in the clinic. If anything worsens or changes patient will contact our office for  additional recommendations. Electronic Signature(s) Signed: 01/07/2022 2:35:03 PM By: Worthy Keeler PA-C Entered By: Worthy Keeler on 01/07/2022 14:35:03 Gene Brown ([id number]) -------------------------------------------------------------------------------- ROS/PFSH Details Patient Name: Gene Brown Date of Service: 01/07/2022 12:45 PM Medical Record Number: [id number] Patient Account Number: [id number] Date of Birth/Sex: 1950/10/15 (71 y.o. M) Treating RN: Cornell Barman Primary Care Provider: Tracie Harrier Other Clinician: Referring Provider: Caroline More Treating Provider/Extender: Skipper Cliche in Treatment: 0 Information Obtained From Patient Chart Eyes Complaints and Symptoms: Positive for: Glasses / Contacts Medical History: Positive for: Cataracts Negative for: Glaucoma; Optic Neuritis Cardiovascular Complaints and Symptoms: Positive for: LE edema Medical History: Positive for: Hypertension Negative for: Angina; Arrhythmia; Congestive Heart Failure; Coronary Artery Disease; Deep Vein Thrombosis; Hypotension; Myocardial Infarction; Peripheral Arterial Disease; Peripheral Venous Disease; Phlebitis; Vasculitis Constitutional Symptoms (General Health) Medical History: Past Medical History Notes: Knee surgery x 4; aorta and hernia repair; gastric bypass Ear/Nose/Mouth/Throat Medical History: Negative for: Chronic sinus problems/congestion; Middle ear problems Hematologic/Lymphatic Medical History: Positive for: Sickle Cell Disease - Trait Negative for: Anemia; Hemophilia; Human Immunodeficiency Virus; Lymphedema Respiratory Medical History: Positive for: Sleep Apnea Negative for: Aspiration; Asthma; Chronic Obstructive Pulmonary Disease (COPD); Pneumothorax; Tuberculosis Gastrointestinal Medical History: Negative for: Cirrhosis ; Colitis; Crohnos; Hepatitis A; Hepatitis B; Hepatitis C Endocrine Medical History: Positive for: Type II  Diabetes Negative for: Type I Diabetes Time with diabetes: 1995 Treated with: Insulin, Oral agents Blood sugar tested every day: No Gene Brown, Gene Brown ([id number]) Genitourinary Medical History: Negative for: End Stage Renal Disease Immunological Medical History: Negative for: Lupus Erythematosus; Raynaudos; Scleroderma Integumentary (Skin) Medical History: Negative for: History of Burn; History of pressure wounds Musculoskeletal Medical History: Positive for: Gout; Osteoarthritis - knees Negative for: Rheumatoid Arthritis; Osteomyelitis Neurologic Medical History: Negative for: Dementia; Neuropathy; Quadriplegia; Paraplegia; Seizure Disorder Oncologic Medical History: Negative for: Received Chemotherapy; Received Radiation Psychiatric Medical History: Negative for: Anorexia/bulimia HBO Extended History Items Eyes: Cataracts Immunizations Pneumococcal Vaccine: Received Pneumococcal Vaccination: Yes Received Pneumococcal Vaccination On or After 60th Birthday: Yes Implantable Devices No devices added Family and Social History Cancer: Yes - Father; Diabetes: No; Heart Disease: No; Hypertension: No; Kidney Disease: No; Lung  Disease: Yes - Mother; Seizures: No; Stroke: No; Thyroid Problems: No; Tuberculosis: No; Never smoker; Marital Status - Married; Alcohol Use: Rarely; Drug Use: No History; Caffeine Use: Daily Electronic Signature(s) Signed: 01/07/2022 3:55:46 PM By: [name], BSN, RN, CWS, Kim RN, BSN Signed: 01/07/2022 4:36:57 PM By: Worthy Keeler PA-C Entered By: [name] BSN, RN, CWS, Kim on 01/07/2022 13:09:48 Gene Brown ([id number]) -------------------------------------------------------------------------------- [place] Details Patient Name: Gene Brown Date of Service: 01/07/2022 Medical Record Number: [id number] Patient Account Number: [id number] Date of Birth/Sex: [birth year] (71 y.o. 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 I10 Essential (primary) hypertension Facility Procedures CPT4 Code: [id number] Description: 99213 - WOUND CARE VISIT-LEV 3 EST PT Modifier: Quantity: 1 CPT4 Code: [id number] Description: [id number] - DEB SUBQ TISSUE 20 SQ CM/< Modifier: Quantity: 1 CPT4 Code: Description: ICD-10 Diagnosis Description 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o Modifier: sed Quantity: Physician Procedures CPT4 [id number] Description: WC PHYS LEVEL 3 o NEW PT Modifier: 25 Quantity: 1 CPT4 Code: Description: ICD-10 Diagnosis Description E11.621 Type 2 diabetes mellitus with foot ulcer 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 E11.42 Type 2 diabetes mellitus with diabetic polyneuropathy I10 Essential  (primary) hypertension Modifier: osed Quantity: CPT4 Code: [id number] Description: 11042 - WC PHYS SUBQ TISS 20 SQ CM Modifier: Quantity: 1 CPT4 Code: Description: ICD-10 Diagnosis Description 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 Modifier: osed Quantity: Electronic Signature(s) Signed: 01/07/2022 2:35:40 PM By: Worthy Keeler PA-C Entered By: Worthy Keeler on 01/07/2022 14:35:40

## 2022-01-07 NOTE — Progress Notes (Signed)
LYNDOL, VANDERHEIDEN ([id number]) Visit Report for 01/07/2022 Abuse Risk Screen Details Patient Name: Gene Brown, Gene Brown Date of Service: 01/07/2022 12:45 PM Medical Record Number: [id number] Patient Account Number: [id number] Date of Birth/Sex: [birth year] (71 y.o. M) Treating RN: Huel Coventry Primary Care Brain Honeycutt: Barbette Reichmann Other Clinician: Referring Alphia Behanna: Rosetta Posner Treating Perrion Diesel/Extender: Rowan Blase in Treatment: 0 Abuse Risk Screen Items Answer ABUSE RISK SCREEN: Has anyone close to you tried to hurt or harm you recentlyo No Do you feel uncomfortable with anyone in your familyo No Has anyone forced you do things that you didnot want to doo No Electronic Signature(s) Signed: 01/07/2022 3:55:46 PM By: Elliot Gurney, BSN, RN, CWS, Kim RN, BSN Entered By: Elliot Gurney, BSN, RN, CWS, Kim on 01/07/2022 13:10:04 Gene Brown ([id number]) -------------------------------------------------------------------------------- Activities of Daily Living Details Patient Name: Gene Brown Date of Service: 01/07/2022 12:45 PM Medical Record Number: [id number] Patient Account Number: [id number] Date of Birth/Sex: 1951-05-03 (71 y.o. M) Treating RN: Huel Coventry Primary Care Jamiel Goncalves: Barbette Reichmann Other Clinician: Referring Bethani Brugger: Rosetta Posner Treating Jasmain Ahlberg/Extender: Rowan Blase in Treatment: 0 Activities of Daily Living Items Answer Activities of Daily Living (Please select one for each item) Drive Automobile Completely Able Take Medications Completely Able Use Telephone Completely Able Care for Appearance Completely Able Use Toilet Completely Able Bath / Shower Completely Able Dress Self Completely Able Feed Self Completely Able Walk Completely Able Get In / Out Bed Completely Able Housework Completely Able Prepare Meals Completely Able Handle Money Completely Able Shop for Self Completely Able Electronic Signature(s) Signed: 01/07/2022 3:55:46 PM By: Elliot Gurney, BSN,  RN, CWS, Kim RN, BSN Entered By: Elliot Gurney, BSN, RN, CWS, Kim on 01/07/2022 13:10:13 Gene Brown ([id number]) -------------------------------------------------------------------------------- Education Screening Details Patient Name: Gene Brown Date of Service: 01/07/2022 12:45 PM Medical Record Number: [id number] Patient Account Number: [id number] Date of Birth/Sex: [birth year] (71 y.o. M) Treating RN: Huel Coventry Primary Care Delena Casebeer: Barbette Reichmann Other Clinician: Referring Philipe Laswell: Rosetta Posner Treating Floella Ensz/Extender: Rowan Blase in Treatment: 0 Primary Learner Assessed: Patient Learning Preferences/Education Level/Primary Language Learning Preference: Explanation, Demonstration Highest Education Level: College or Above Preferred Language: English Physical Barrier Impaired Vision: Yes Glasses Impaired Hearing: Yes Hearing Aid Knowledge/Comprehension Knowledge Level: High Comprehension Level: High Ability to understand written instructions: High Ability to understand verbal instructions: High Motivation Anxiety Level: Calm Cooperation: Cooperative Education Importance: Acknowledges Need Interest in Health Problems: Asks Questions Perception: Coherent Willingness to Engage in Self-Management High Activities: Readiness to Engage in Self-Management High Activities: Electronic Signature(s) Signed: 01/07/2022 3:55:46 PM By: Elliot Gurney, BSN, RN, CWS, Kim RN, BSN Entered By: Elliot Gurney, BSN, RN, CWS, Kim on 01/07/2022 13:10:44 Gene Brown ([id number]) -------------------------------------------------------------------------------- Fall Risk Assessment Details Patient Name: Gene Brown Date of Service: 01/07/2022 12:45 PM Medical Record Number: [id number] Patient Account Number: [id number] Date of Birth/Sex: November 23, 1950 (71 y.o. M) Treating RN: Huel Coventry Primary Care Jamil Armwood: Barbette Reichmann Other Clinician: Referring Vu Liebman: Rosetta Posner Treating  Niyana Chesbro/Extender: Rowan Blase in Treatment: 0 Fall Risk Assessment Items Have you had 2 or more falls in the last 12 monthso 0 No Have you had any fall that resulted in injury in the last 12 monthso 0 No FALLS RISK SCREEN History of falling - immediate or within 3 months 0 No Secondary diagnosis (Do you have 2 or more medical diagnoseso) 0 No Ambulatory aid None/bed rest/wheelchair/nurse 0 No Crutches/cane/walker 15 Yes Furniture 0 No Intravenous therapy Access/Saline/Heparin Lock 0 No Gait/Transferring Normal/ bed rest/ wheelchair 0 No Weak (short steps  with or without shuffle, stooped but able to lift head while walking, may 10 Yes seek support from furniture) Impaired (short steps with shuffle, may have difficulty arising from chair, head down, impaired 0 No balance) Mental Status Oriented to own ability 0 Yes Electronic Signature(s) Signed: 01/07/2022 3:55:46 PM By: Elliot Gurney, BSN, RN, CWS, Kim RN, BSN Entered By: Elliot Gurney, BSN, RN, CWS, Kim on 01/07/2022 13:11:16 Gene Brown ([id number]) -------------------------------------------------------------------------------- Foot Assessment Details Patient Name: Gene Brown Date of Service: 01/07/2022 12:45 PM Medical Record Number: [id number] Patient Account Number: [id number] Date of Birth/Sex: Feb 20, 1951 (71 y.o. M) Treating RN: Huel Coventry Primary Care Keniesha Adderly: Barbette Reichmann Other Clinician: Referring Cecile Guevara: Rosetta Posner Treating Gloristine Turrubiates/Extender: Rowan Blase in Treatment: 0 Foot Assessment Items Site Locations + = Sensation present, - = Sensation absent, C = Callus, U = Ulcer R = Redness, W = Warmth, M = Maceration, PU = Pre-ulcerative lesion F = Fissure, S = Swelling, D = Dryness Assessment Right: Left: Other Deformity: No No Prior Foot Ulcer: No No Prior Amputation: No No Charcot Joint: No No Ambulatory Status: Ambulatory With Help Assistance Device: Cane GaitDance movement psychotherapist) Signed: 01/07/2022 3:55:46 PM By: Elliot Gurney, BSN, RN, CWS, Kim RN, BSN Entered By: Elliot Gurney, BSN, RN, CWS, Kim on 01/07/2022 13:15:50 Gene Brown ([id number]) -------------------------------------------------------------------------------- Nutrition Risk Screening Details Patient Name: Gene Brown Date of Service: 01/07/2022 12:45 PM Medical Record Number: [id number] Patient Account Number: [id number] Date of Birth/Sex: 01-19-1951 (71 y.o. M) Treating RN: Huel Coventry Primary Care Orest Dygert: Barbette Reichmann Other Clinician: Referring Avigayil Ton: Rosetta Posner Treating Gabryella Murfin/Extender: Rowan Blase in Treatment: 0 Height (in): 68 Weight (lbs): 217 Body Mass Index (BMI): 33 Nutrition Risk Screening Items Score Screening NUTRITION RISK SCREEN: I have an illness or condition that made me change the kind and/or amount of food I eat 0 No I eat fewer than two meals per day 0 No I eat few fruits and vegetables, or milk products 0 No I have three or more drinks of beer, liquor or wine almost every day 0 No I have tooth or mouth problems that make it hard for me to eat 0 No I don't always have enough money to buy the food I need 0 No I eat alone most of the time 0 No I take three or more different prescribed or over-the-counter drugs a day 1 Yes Without wanting to, I have lost or gained 10 pounds in the last six months 0 No I am not always physically able to shop, cook and/or feed myself 0 No Nutrition Protocols Good Risk Protocol 0 No interventions needed Moderate Risk Protocol High Risk Proctocol Risk Level: Good Risk Score: 1 Electronic Signature(s) Signed: 01/07/2022 3:55:46 PM By: Elliot Gurney, BSN, RN, CWS, Kim RN, BSN Entered By: Elliot Gurney, BSN, RN, CWS, Kim on 01/07/2022 13:11:46

## 2022-01-07 NOTE — Progress Notes (Signed)
AMES, STANFIELD ([id number]) Visit Report for 01/07/2022 Allergy List Details Patient Name: Gene Brown, [place] Date of Service: 01/07/2022 12:45 PM Medical Record Number: [id number] Patient Account Number: [id number] Date of Birth/Sex: March 13, 1951 (71 y.o. M) Treating RN: Cornell Barman Primary Care Oliverio Cho: Tracie Harrier Other Clinician: Referring Jalaina Salyers: Caroline More Treating Mikayela Deats/Extender: Skipper Cliche in Treatment: 0 Allergies Active Allergies Statins-Hmg-Coa Reductase Inhibitors Allergy Notes Electronic Signature(s) Signed: 01/07/2022 3:55:46 PM By: [name], BSN, RN, CWS, Kim RN, BSN Entered By: [name], BSN, RN, CWS, Kim on 01/07/2022 13:07:55 Gene Brown ([id number]) -------------------------------------------------------------------------------- Arrival Information Details Patient Name: Gene Brown Date of Service: 01/07/2022 12:45 PM Medical Record Number: [id number] Patient Account Number: [id number] Date of Birth/Sex: [birth year] (71 y.o. M) Treating RN: Cornell Barman Primary Care Trevis Eden: Tracie Harrier Other Clinician: Referring Treasure Ingrum: Caroline More Treating Berl Bonfanti/Extender: Skipper Cliche in Treatment: 0 Visit Information Patient Arrived: Lyndel Pleasure Time: 12:55 Transfer Assistance: None Patient Identification Verified: Yes Secondary Verification Process Completed: Yes Patient Has Alerts: Yes Patient Alerts: Patient on Blood Thinner 01/07/2022 L[place]>220 on left History Since Last Visit Has Footwear/Offloading in Place as Prescribed: Yes Left: Wedge Shoe Pain Present Now: No Electronic Signature(s) Signed: 01/07/2022 3:55:46 PM By: [name], BSN, RN, CWS, Kim RN, BSN Entered By: [name], BSN, RN, CWS, Kim on 01/07/2022 13:24:27 Gene Brown ([id number]) -------------------------------------------------------------------------------- Clinic Level of Care Assessment Details Patient Name: Gene Brown Date of Service: 01/07/2022 12:45 PM Medical  Record Number: [id number] Patient Account Number: [id number] Date of Birth/Sex: 08-24-1950 (71 y.o. M) Treating RN: Cornell Barman Primary Care Izabela Ow: Tracie Harrier Other Clinician: Referring Lord Lancour: Caroline More Treating Emmi Wertheim/Extender: Skipper Cliche in Treatment: 0 Clinic Level of Care Assessment Items TOOL 1 Quantity Score []  - Use when EandM and Procedure is performed on INITIAL visit 0 ASSESSMENTS - Nursing Assessment / Reassessment X - General Physical Exam (combine w/ comprehensive assessment (listed just below) when performed on new 1 20 pt. evals) X- 1 25 Comprehensive Assessment (HX, ROS, Risk Assessments, Wounds Hx, etc.) ASSESSMENTS - Wound and Skin Assessment / Reassessment []  - Dermatologic / Skin Assessment (not related to wound area) 0 ASSESSMENTS - Ostomy and/or Continence Assessment and Care []  - Incontinence Assessment and Management 0 []  - 0 Ostomy Care Assessment and Management (repouching, etc.) PROCESS - Coordination of Care X - Simple Patient / Family Education for ongoing care 1 15 []  - 0 Complex (extensive) Patient / Family Education for ongoing care X- 1 10 Staff obtains Programmer, systems, Records, Test Results / Process Orders []  - 0 Staff telephones HHA, Nursing Homes / Clarify orders / etc []  - 0 Routine Transfer to another Facility (non-emergent condition) []  - 0 Routine Hospital Admission (non-emergent condition) X- 1 15 New Admissions / Biomedical engineer / Ordering NPWT, Apligraf, etc. []  - 0 Emergency Hospital Admission (emergent condition) PROCESS - Special Needs []  - Pediatric / Minor Patient Management 0 []  - 0 Isolation Patient Management []  - 0 Hearing / Language / Visual special needs []  - 0 Assessment of Community assistance (transportation, D/C planning, etc.) []  - 0 Additional assistance / Altered mentation []  - 0 Support Surface(s) Assessment (bed, cushion, seat, etc.) INTERVENTIONS - Miscellaneous []  - External  ear exam 0 []  - 0 Patient Transfer (multiple staff / Civil Service fast streamer / Similar devices) []  - 0 Simple Staple / Suture removal (25 or less) []  - 0 Complex Staple / Suture removal (26 or more) []  - 0 Hypo/Hyperglycemic Management (do not check if billed separately) X- 1  15 Ankle / Brachial Index (ABI) - do not check if billed separately Has the patient been seen at the hospital within the last three years: Yes Total Score: 100 Level Of Care: New/Established - Level 3 KREED, POLYAK ([id number]) Electronic Signature(s) Signed: 01/07/2022 3:55:46 PM By: [name], BSN, RN, CWS, Kim RN, BSN Entered By: [name], BSN, RN, CWS, Kim on 01/07/2022 13:45:07 Gene Brown ([id number]) -------------------------------------------------------------------------------- Encounter Discharge Information Details Patient Name: Gene Brown Date of Service: 01/07/2022 12:45 PM Medical Record Number: [id number] Patient Account Number: [id number] Date of Birth/Sex: 03-17-1951 (71 y.o. M) Treating RN: Cornell Barman Primary Care Karthik Whittinghill: Tracie Harrier Other Clinician: Referring Karry Causer: Caroline More Treating Beonca Gibb/Extender: Skipper Cliche in Treatment: 0 Encounter Discharge Information Items Post Procedure Vitals Discharge Condition: Stable Unable to obtain vitals Reason: limited time Ambulatory Status: Cane Discharge Destination: Home Transportation: Private Auto Accompanied By: self Schedule Follow-up Appointment: Yes Clinical Summary of Care: Electronic Signature(s) Signed: 01/07/2022 3:55:46 PM By: [name], BSN, RN, CWS, Kim RN, BSN Entered By: [name], BSN, RN, CWS, Kim on 01/07/2022 14:01:21 Gene Brown ([id number]) -------------------------------------------------------------------------------- Lower Extremity Assessment Details Patient Name: Gene Brown Date of Service: 01/07/2022 12:45 PM Medical Record Number: [id number] Patient Account Number: [id number] Date of Birth/Sex:  Aug 30, 1950 (71 y.o. M) Treating RN: Cornell Barman Primary Care Radiah Lubinski: Tracie Harrier Other Clinician: Referring Saphira Lahmann: Caroline More Treating Hildred Mollica/Extender: Skipper Cliche in Treatment: 0 Edema Assessment Assessed: [Left: Yes] [Right: No] Edema: [Left: Ye] [Right: s] Calf Left: Right: Point of Measurement: 29 cm From Medial Instep 42.1 cm Ankle Left: Right: Point of Measurement: 9 cm From Medial Instep 28 cm Vascular Assessment Pulses: Dorsalis Pedis Palpable: [Left:Yes] Doppler Audible: [Left:Yes] Posterior Tibial Palpable: [Left:Yes] Doppler Audible: [Left:Yes] Blood Pressure: Ankle: [Left:Dorsalis Pedis: [id number] Notes >220 on left Electronic Signature(s) Signed: 01/07/2022 3:55:46 PM By: [name], BSN, RN, CWS, Kim RN, BSN Entered By: [name], BSN, RN, CWS, Kim on 01/07/2022 13:24:11 Gene Brown ([id number]) -------------------------------------------------------------------------------- Multi Wound Chart Details Patient Name: Gene Brown Date of Service: 01/07/2022 12:45 PM Medical Record Number: [id number] Patient Account Number: [id number] Date of Birth/Sex: 14-Jun-1951 (71 y.o. M) Treating RN: Cornell Barman Primary Care Dezzie Badilla: Tracie Harrier Other Clinician: Referring Kensly Bowmer: Caroline More Treating Desirey Keahey/Extender: Skipper Cliche in Treatment: 0 Vital Signs Height(in): 80 Pulse(bpm): 17 Weight(lbs): 217 Blood Pressure(mmHg): 182/78 Body Mass Index(BMI): 33 Temperature(F): 97.7 Respiratory Rate(breaths/min): 18 Photos: [N/A:N/A] Wound Location: Left, Plantar Toe Great N/A N/A Wounding Event: Gradually Appeared N/A N/A Primary Etiology: Diabetic Wound/Ulcer of the Lower N/A N/A Extremity Comorbid History: Cataracts, Sickle Cell Disease, Sleep N/A N/A Apnea, Hypertension, Type II Diabetes, Gout, Osteoarthritis Date Acquired: 12/09/2021 N/A N/A Weeks of Treatment: 0 N/A N/A Wound Status: Open N/A N/A Wound Recurrence: No N/A  N/A Measurements L x W x D (cm) 0.6x0.6x0.5 N/A N/A Area (cm) : 0.283 N/A N/A Volume (cm) : 0.141 N/A N/A % Reduction in Area: 0.00% N/A N/A % Reduction in Volume: 0.00% N/A N/A Starting Position 1 (o'clock): 11 Ending Position 1 (o'clock): 4 Maximum Distance 1 (cm): 0.6 Undermining: Yes N/A N/A Classification: Grade 2 N/A N/A Exudate Amount: Medium N/A N/A Exudate Type: Serous N/A N/A Exudate Color: amber N/A N/A Wound Margin: Flat and Intact N/A N/A Granulation Amount: Large (67-100%) N/A N/A Necrotic Amount: Small (1-33%) N/A N/A Exposed Structures: Fat Layer (Subcutaneous Tissue): N/A N/A Yes Fascia: No Tendon: No Muscle: No Joint: No Bone: No Epithelialization: None N/A N/A Assessment Notes: Callus surrounding wound and N/A N/A covering  most of toe's plantar surface. Treatment Notes ADVAIT, MCCOIG ([id number]) Electronic Signature(s) Signed: 01/07/2022 3:55:46 PM By: [name], BSN, RN, CWS, Kim RN, BSN Entered By: [name], BSN, RN, CWS, Kim on 01/07/2022 13:39:34 Gene Brown ([id number]) -------------------------------------------------------------------------------- Multi-Disciplinary Care Plan Details Patient Name: Gene Brown Date of Service: 01/07/2022 12:45 PM Medical Record Number: [id number] Patient Account Number: [id number] Date of Birth/Sex: 1951/02/10 (71 y.o. M) Treating RN: Cornell Barman Primary Care Krystn Dermody: Tracie Harrier Other Clinician: Referring Lorence Nagengast: Caroline More Treating Amoreena Neubert/Extender: Skipper Cliche in Treatment: 0 Active Inactive Necrotic Tissue Nursing Diagnoses: Impaired tissue integrity related to necrotic/devitalized tissue Knowledge deficit related to management of necrotic/devitalized tissue Goals: Necrotic/devitalized tissue will be minimized in the wound bed Date Initiated: 01/07/2022 Target Resolution Date: 01/07/2022 Goal Status: Active Patient/caregiver will verbalize understanding of reason and process for  debridement of necrotic tissue Date Initiated: 01/07/2022 Target Resolution Date: 01/07/2022 Goal Status: Active Interventions: Assess patient pain level pre-, during and post procedure and prior to discharge Provide education on necrotic tissue and debridement process Treatment Activities: Apply topical anesthetic as ordered : 01/07/2022 Excisional debridement : 01/07/2022 Notes: Orientation to the Wound Care Program Nursing Diagnoses: Knowledge deficit related to the wound healing center program Goals: Patient/caregiver will verbalize understanding of the [place] Date Initiated: 01/07/2022 Target Resolution Date: 01/07/2022 Goal Status: Active Interventions: Provide education on orientation to the wound center Notes: Peripheral Neuropathy Nursing Diagnoses: Knowledge deficit related to disease process and management of peripheral neurovascular dysfunction Potential alteration in peripheral tissue perfusion (select prior to confirmation of diagnosis) Goals: Patient/caregiver will verbalize understanding of disease process and disease management Date Initiated: 01/07/2022 Target Resolution Date: 01/07/2022 Goal Status: Active Interventions: Assess signs and symptoms of neuropathy upon admission and as needed Provide education on Management of Neuropathy and Related Ulcers Provide education on Management of Neuropathy upon discharge from the [facility]. ([id number]) Notes: Wound/Skin Impairment Nursing Diagnoses: Impaired tissue integrity Knowledge deficit related to smoking impact on wound healing Goals: Patient/caregiver will verbalize understanding of skin care regimen Date Initiated: 01/07/2022 Target Resolution Date: 01/07/2022 Goal Status: Active Ulcer/skin breakdown will have a volume reduction of 30% by week 4 Date Initiated: 01/07/2022 Target Resolution Date: 02/04/2022 Goal Status: Active Ulcer/skin breakdown will have a volume reduction of  50% by week 8 Date Initiated: 01/07/2022 Target Resolution Date: 03/03/2022 Goal Status: Active Interventions: Assess patient/caregiver ability to obtain necessary supplies Assess patient/caregiver ability to perform ulcer/skin care regimen upon admission and as needed Assess ulceration(s) every visit Treatment Activities: Referred to DME Feige Lowdermilk for dressing supplies : 01/07/2022 Skin care regimen initiated : 01/07/2022 Topical wound management initiated : 01/07/2022 Notes: Electronic Signature(s) Signed: 01/07/2022 3:55:46 PM By: [name], BSN, RN, CWS, Kim RN, BSN Entered By: [name], BSN, RN, CWS, Kim on 01/07/2022 13:39:21 Gene Brown ([id number]) -------------------------------------------------------------------------------- Pain Assessment Details Patient Name: Gene Brown Date of Service: 01/07/2022 12:45 PM Medical Record Number: [id number] Patient Account Number: [id number] Date of Birth/Sex: 1951/05/02 (71 y.o. M) Treating RN: Cornell Barman Primary Care Mick Tanguma: Tracie Harrier Other Clinician: Referring Parsa Rickett: Caroline More Treating Nyzir Dubois/Extender: Skipper Cliche in Treatment: 0 Active Problems Location of Pain Severity and Description of Pain Patient Has Paino No Site Locations Pain Management and Medication Current Pain Management: Electronic Signature(s) Signed: 01/07/2022 3:55:46 PM By: [name], BSN, RN, CWS, Kim RN, BSN Entered By: [name], BSN, RN, CWS, Kim on 01/07/2022 13:05:54 Gene Brown ([id number]) -------------------------------------------------------------------------------- Patient/Caregiver Education Details Patient Name: Gene Brown Date  of Service: 01/07/2022 12:45 PM Medical Record Number: [id number] Patient Account Number: [id number] Date of Birth/Gender: 02/25/1951 (71 y.o. M) Treating RN: Cornell Barman Primary Care Physician: Tracie Harrier Other Clinician: Referring Physician: Caroline More Treating Physician/Extender: Skipper Cliche in Treatment: 0 Education Assessment Education Provided To: Patient Education Topics Provided Peripheral Neuropathy: Handouts: Diabetes Methods: Demonstration, Explain/Verbal Responses: State content correctly Welcome To [facility] [place]: Handouts: Welcome To [facility] [place] Methods: Demonstration, Explain/Verbal Responses: State content correctly Wound Debridement: Handouts: Wound Debridement Methods: Demonstration, Explain/Verbal Responses: State content correctly Electronic Signature(s) Signed: 01/07/2022 3:55:46 PM By: [name], BSN, RN, CWS, Kim RN, BSN Entered By: [name], BSN, RN, CWS, Kim on 01/07/2022 13:47:02 Gene Brown ([id number]) -------------------------------------------------------------------------------- Wound Assessment Details Patient Name: Gene Brown Date of Service: 01/07/2022 12:45 PM Medical Record Number: [id number] Patient Account Number: [id number] Date of Birth/Sex: 01/11/1951 (71 y.o. M) Treating RN: Cornell Barman Primary Care Sabrina Keough: Tracie Harrier Other Clinician: Referring Blakely Gluth: Caroline More Treating Samanthajo Payano/Extender: Skipper Cliche in Treatment: 0 Wound Status Wound Number: 2 Primary Diabetic Wound/Ulcer of the Lower Extremity Etiology: Wound Location: Left, Plantar Toe Great Wound Open Wounding Event: Gradually Appeared Status: Date Acquired: 12/09/2021 Comorbid Cataracts, Sickle Cell Disease, Sleep Apnea, Weeks Of Treatment: 0 History: Hypertension, Type II Diabetes, Gout, Osteoarthritis Clustered Wound: No Photos Wound Measurements Length: (cm) 0.6 Width: (cm) 0.6 Depth: (cm) 0.5 Area: (cm) 0.283 Volume: (cm) 0.141 % Reduction in Area: 0% % Reduction in Volume: 0% Epithelialization: None Tunneling: No Undermining: Yes Starting Position (o'clock): 11 Ending Position (o'clock): 4 Maximum Distance: (cm) 0.6 Wound Description Classification: Grade 2 Wound Margin: Flat and Intact Exudate  Amount: Medium Exudate Type: Serous Exudate Color: amber Foul Odor After Cleansing: No Slough/Fibrino Yes Wound Bed Granulation Amount: Large (67-100%) Exposed Structure Necrotic Amount: Small (1-33%) Fascia Exposed: No Necrotic Quality: Adherent Slough Fat Layer (Subcutaneous Tissue) Exposed: Yes Tendon Exposed: No Muscle Exposed: No Joint Exposed: No Bone Exposed: No Assessment Notes Callus surrounding wound and covering most of toe's plantar surface. Treatment Notes MATTHER, DOUPE ([id number]) Wound #2 (Toe Great) Wound Laterality: Plantar, Left Cleanser Soap and Water Discharge Instruction: Gently cleanse wound with antibacterial soap, rinse and pat dry prior to dressing wounds Peri-Wound Care Topical Primary Dressing [place] Small 2x2 (in/in) Discharge Instruction: Apply [place] Small 2x2 (in/in) as instructed Secondary Dressing Conforming [place] Roll-Small Discharge Instruction: [place] as directed Secured With [place] Soft Cloth Surgical Tape, 2x2 (in/yd) Compression Wrap Compression Stockings Environmental education officer) Signed: 01/07/2022 3:55:46 PM By: [name], BSN, RN, CWS, Kim RN, BSN Entered By: [name], BSN, RN, CWS, Kim on 01/07/2022 13:22:51 Gene Brown ([id number]) -------------------------------------------------------------------------------- [place] Details Patient Name: Gene Brown Date of Service: 01/07/2022 12:45 PM Medical Record Number: [id number] Patient Account Number: [id number] Date of Birth/Sex: Jun 09, 1951 (71 y.o. M) Treating RN: Cornell Barman Primary Care Makar Slatter: Tracie Harrier Other Clinician: Referring Ruqaya Strauss: Caroline More Treating Dallyn Bergland/Extender: Skipper Cliche in Treatment: 0 Vital Signs Time Taken: 13:05 Temperature (F): 97.7 Height (in): 68 Pulse (bpm): 66 Weight (lbs): 217 Respiratory Rate (breaths/min): 18 Body Mass Index (BMI): 33 Blood Pressure  (mmHg): 182/78 Reference Range: 80 - 120 mg / dl Electronic Signature(s) Signed: 01/07/2022 3:55:46 PM By: [name], BSN, RN, CWS, Kim RN, BSN Entered By: [name], BSN, RN, CWS, Kim on 01/07/2022 13:06:49

## 2022-01-17 ENCOUNTER — Ambulatory Visit: Payer: Medicare Other | Admitting: Physician Assistant

## 2022-01-20 ENCOUNTER — Other Ambulatory Visit
Admission: RE | Admit: 2022-01-20 | Discharge: 2022-01-20 | Disposition: A | Discharge status: Home / Self Care | Payer: Medicare Other | Source: Ambulatory Visit | Attending: Physician Assistant | Admitting: Physician Assistant

## 2022-01-20 ENCOUNTER — Ambulatory Visit
Admission: RE | Admit: 2022-01-20 | Discharge: 2022-01-20 | Disposition: A | Discharge status: Home / Self Care | Payer: Medicare Other | Source: Ambulatory Visit | Attending: Physician Assistant | Admitting: Physician Assistant

## 2022-01-20 ENCOUNTER — Other Ambulatory Visit: Payer: Self-pay | Admitting: Physician Assistant

## 2022-01-20 ENCOUNTER — Encounter: Payer: Medicare Other | Admitting: Physician Assistant

## 2022-01-20 DIAGNOSIS — Z5189 Encounter for other specified aftercare: Secondary | ICD-10-CM | POA: Diagnosis present

## 2022-01-20 DIAGNOSIS — S81802A Unspecified open wound, left lower leg, initial encounter: Secondary | ICD-10-CM | POA: Diagnosis present

## 2022-01-20 DIAGNOSIS — E11621 Type 2 diabetes mellitus with foot ulcer: Secondary | ICD-10-CM | POA: Diagnosis not present

## 2022-01-20 NOTE — Progress Notes (Addendum)
Gene Brown, Gene Brown ([id number]) Visit Report for 01/20/2022 Arrival Information Details Patient Name: Gene Brown, Gene Brown Date of Service: 01/20/2022 11:15 AM Medical Record Number: [id number] Patient Account Number: [id number] Date of Birth/Sex: 27-Jan-1951 (71 y.o. M) Treating RN: Huel Coventry Primary Care Zavien Clubb: Barbette Reichmann Other Clinician: Betha Loa Referring Stepheni Cameron: Barbette Reichmann Treating Jocee Kissick/Extender: Rowan Blase in Treatment: 1 Visit Information History Since Last Visit All ordered tests and consults were completed: No Patient Arrived: Gene Brown Added or deleted any medications: No Arrival Time: 11:42 Any new allergies or adverse reactions: No Transfer Assistance: None Had a fall or experienced change in No Patient Has Alerts: Yes activities of daily living that may affect Patient Alerts: Patient on Blood Thinner risk of falls: 01/07/2022 [place]>220 on left Hospitalized since last visit: No Pain Present Now: No Electronic Signature(s) Signed: 01/20/2022 4:07:29 PM By: Betha Loa Entered By: Betha Loa on 01/20/2022 11:44:33 Gene Brown ([id number]) -------------------------------------------------------------------------------- Clinic Level of Care Assessment Details Patient Name: Gene Brown Date of Service: 01/20/2022 11:15 AM Medical Record Number: [id number] Patient Account Number: [id number] Date of Birth/Sex: 1951-07-16 (71 y.o. M) Treating RN: Huel Coventry Primary Care Campbell Kray: Barbette Reichmann Other Clinician: Betha Loa Referring Amzie Sillas: Barbette Reichmann Treating Julia Alkhatib/Extender: Rowan Blase in Treatment: 1 Clinic Level of Care Assessment Items TOOL 1 Quantity Score []  - Use when EandM and Procedure is performed on INITIAL visit 0 ASSESSMENTS - Nursing Assessment / Reassessment []  - General Physical Exam (combine w/ comprehensive assessment (listed just below) when performed on new 0 pt. evals) []  -  0 Comprehensive Assessment (HX, ROS, Risk Assessments, Wounds Hx, etc.) ASSESSMENTS - Wound and Skin Assessment / Reassessment []  - Dermatologic / Skin Assessment (not related to wound area) 0 ASSESSMENTS - Ostomy and/or Continence Assessment and Care []  - Incontinence Assessment and Management 0 []  - 0 Ostomy Care Assessment and Management (repouching, etc.) PROCESS - Coordination of Care []  - Simple Patient / Family Education for ongoing care 0 []  - 0 Complex (extensive) Patient / Family Education for ongoing care []  - 0 Staff obtains , Records, Test Results / Process Orders []  - 0 Staff telephones HHA, Nursing Homes / Clarify orders / etc []  - 0 Routine Transfer to another Facility (non-emergent condition) []  - 0 Routine Hospital Admission (non-emergent condition) []  - 0 New Admissions / / Ordering NPWT, Apligraf, etc. []  - 0 Emergency Hospital Admission (emergent condition) PROCESS - Special Needs []  - Pediatric / Minor Patient Management 0 []  - 0 Isolation Patient Management []  - 0 Hearing / Language / Visual special needs []  - 0 Assessment of Community assistance (transportation, D/C planning, etc.) []  - 0 Additional assistance / Altered mentation []  - 0 Support Surface(s) Assessment (bed, cushion, seat, etc.) INTERVENTIONS - Miscellaneous []  - External ear exam 0 []  - 0 Patient Transfer (multiple staff / / Similar devices) []  - 0 Simple Staple / Suture removal (25 or less) []  - 0 Complex Staple / Suture removal (26 or more) []  - 0 Hypo/Hyperglycemic Management (do not check if billed separately) []  - 0 Ankle / Brachial Index (ABI) - do not check if billed separately Has the patient been seen at the hospital within the last three years: Yes Total Score: 0 Level Of Care: ____ ( ) Electronic Signature(s) Signed: 01/20/2022 4:07:29 PM By: Entered By: on  01/20/2022 12:26:37 Chiropractor ( ) -------------------------------------------------------------------------------- Encounter Discharge Information Details Patient Name: Date of  Service: 01/20/2022 11:15 AM Medical Record Number: [id number] Patient Account Number: [id number] Date of Birth/Sex: [birth year] (71 y.o. M) Treating RN: Huel Coventry Primary Care Irlene Crudup: Barbette Reichmann Other Clinician: Betha Loa Referring Jaelle Campanile: Barbette Reichmann Treating Kaislee Chao/Extender: Rowan Blase in Treatment: 1 Encounter Discharge Information Items Post Procedure Vitals Discharge Condition: Stable Temperature (F): 97.8 Ambulatory Status: Cane Pulse (bpm): 67 Discharge Destination: Home Respiratory Rate (breaths/min): 18 Transportation: Private Auto Blood Pressure (mmHg): 158/80 Accompanied By: self Schedule Follow-up Appointment: Yes Clinical Summary of Care: Electronic Signature(s) Signed: 01/20/2022 4:07:29 PM By: Betha Loa Entered By: Betha Loa on 01/20/2022 12:28:31 Gene Brown ([id number]) -------------------------------------------------------------------------------- Lower Extremity Assessment Details Patient Name: Gene Brown Date of Service: 01/20/2022 11:15 AM Medical Record Number: [id number] Patient Account Number: [id number] Date of Birth/Sex: Nov 24, 1950 (71 y.o. M) Treating RN: Huel Coventry Primary Care Kiaira Pointer: Barbette Reichmann Other Clinician: Betha Loa Referring Mandie Crabbe: Barbette Reichmann Treating Taeshaun Rames/Extender: Rowan Blase in Treatment: 1 Edema Assessment Assessed: [Left: Yes] [Right: No] Edema: [Left: Ye] [Right: s] Calf Left: Right: Point of Measurement: 29 cm From Medial Instep 45 cm Ankle Left: Right: Point of Measurement: 9 cm From Medial Instep 31.2 cm Vascular Assessment Pulses: Dorsalis Pedis Palpable: [Left:Yes] Electronic Signature(s) Signed: 01/20/2022 4:07:29 PM By:  Betha Loa Signed: 01/20/2022 5:09:42 PM By: Elliot Gurney, BSN, RN, CWS, Kim RN, BSN Entered By: Betha Loa on 01/20/2022 11:57:57 Gene Brown ([id number]) -------------------------------------------------------------------------------- Multi Wound Chart Details Patient Name: Gene Brown Date of Service: 01/20/2022 11:15 AM Medical Record Number: [id number] Patient Account Number: [id number] Date of Birth/Sex: [birth year] (71 y.o. M) Treating RN: Huel Coventry Primary Care Anoushka Divito: Barbette Reichmann Other Clinician: Betha Loa Referring Beuford Garcilazo: Barbette Reichmann Treating Krystal Teachey/Extender: Rowan Blase in Treatment: 1 Vital Signs Height(in): 68 Pulse(bpm): 67 Weight(lbs): 217 Blood Pressure(mmHg): 158/80 Body Mass Index(BMI): 33 Temperature(F): 97.8 Respiratory Rate(breaths/min): 16 Photos: [N/A:N/A] Wound Location: Left, Plantar Toe Great N/A N/A Wounding Event: Gradually Appeared N/A N/A Primary Etiology: Diabetic Wound/Ulcer of the Lower N/A N/A Extremity Comorbid History: Cataracts, Sickle Cell Disease, Sleep N/A N/A Apnea, Hypertension, Type II Diabetes, Gout, Osteoarthritis Date Acquired: 12/09/2021 N/A N/A Weeks of Treatment: 1 N/A N/A Wound Status: Open N/A N/A Wound Recurrence: No N/A N/A Measurements L x W x D (cm) 1x1.4x0.4 N/A N/A Area (cm) : 1.1 N/A N/A Volume (cm) : 0.44 N/A N/A % Reduction in Area: -288.70% N/A N/A % Reduction in Volume: -212.10% N/A N/A Classification: Grade 2 N/A N/A Exudate Amount: Medium N/A N/A Exudate Type: Serous N/A N/A Exudate Color: amber N/A N/A Wound Margin: Flat and Intact N/A N/A Granulation Amount: Large (67-100%) N/A N/A Granulation Quality: Pink N/A N/A Necrotic Amount: Small (1-33%) N/A N/A Exposed Structures: Fat Layer (Subcutaneous Tissue): N/A N/A Yes Fascia: No Tendon: No Muscle: No Joint: No Bone: No Epithelialization: None N/A N/A Treatment Notes Electronic Signature(s) Signed:  01/20/2022 4:07:29 PM By: Betha Loa Entered By: Betha Loa on 01/20/2022 11:58:28 Gene Brown ([name] ([id number]) -------------------------------------------------------------------------------- Multi-Disciplinary Care Plan Details Patient Name: Gene Brown Date of Service: 01/20/2022 11:15 AM Medical Record Number: [id number] Patient Account Number: [id number] Date of Birth/Sex: Oct 18, 1950 (71 y.o. M) Treating RN: Huel Coventry Primary Care Makaia Rappa: Barbette Reichmann Other Clinician: Betha Loa Referring Jadaya Sommerfield: Barbette Reichmann Treating Karel Mowers/Extender: Rowan Blase in Treatment: 1 Active Inactive Necrotic Tissue Nursing Diagnoses: Impaired tissue integrity related to necrotic/devitalized tissue Knowledge deficit related to management of necrotic/devitalized tissue Goals: Necrotic/devitalized tissue will be minimized in the wound bed Date Initiated:  01/07/2022 Target Resolution Date: 01/07/2022 Goal Status: Active Patient/caregiver will verbalize understanding of reason and process for debridement of necrotic tissue Date Initiated: 01/07/2022 Target Resolution Date: 01/07/2022 Goal Status: Active Interventions: Assess patient pain level pre-, during and post procedure and prior to discharge Provide education on necrotic tissue and debridement process Treatment Activities: Apply topical anesthetic as ordered : 01/07/2022 Excisional debridement : 01/07/2022 Notes: Orientation to the Wound Care Program Nursing Diagnoses: Knowledge deficit related to the wound healing center program Goals: Patient/caregiver will verbalize understanding of the [place] Date Initiated: 01/07/2022 Target Resolution Date: 01/07/2022 Goal Status: Active Interventions: Provide education on orientation to the wound center Notes: Peripheral Neuropathy Nursing Diagnoses: Knowledge deficit related to disease process and management of  peripheral neurovascular dysfunction Potential alteration in peripheral tissue perfusion (select prior to confirmation of diagnosis) Goals: Patient/caregiver will verbalize understanding of disease process and disease management Date Initiated: 01/07/2022 Target Resolution Date: 01/07/2022 Goal Status: Active Interventions: Assess signs and symptoms of neuropathy upon admission and as needed Provide education on Management of Neuropathy and Related Ulcers Provide education on Management of Neuropathy upon discharge from the [place]. ([id number]) Notes: Wound/Skin Impairment Nursing Diagnoses: Impaired tissue integrity Knowledge deficit related to smoking impact on wound healing Goals: Patient/caregiver will verbalize understanding of skin care regimen Date Initiated: 01/07/2022 Target Resolution Date: 01/07/2022 Goal Status: Active Ulcer/skin breakdown will have a volume reduction of 30% by week 4 Date Initiated: 01/07/2022 Target Resolution Date: 02/04/2022 Goal Status: Active Ulcer/skin breakdown will have a volume reduction of 50% by week 8 Date Initiated: 01/07/2022 Target Resolution Date: 03/03/2022 Goal Status: Active Interventions: Assess patient/caregiver ability to obtain necessary supplies Assess patient/caregiver ability to perform ulcer/skin care regimen upon admission and as needed Assess ulceration(s) every visit Treatment Activities: Referred to DME Laasia Arcos for dressing supplies : 01/07/2022 Skin care regimen initiated : 01/07/2022 Topical wound management initiated : 01/07/2022 Notes: Electronic Signature(s) Signed: 01/20/2022 4:07:29 PM By: Massie Kluver Signed: 01/20/2022 5:09:42 PM By: [name], BSN, RN, CWS, Kim RN, BSN Entered By: Massie Kluver on 01/20/2022 11:58:06 Gene Brown ([id number]) -------------------------------------------------------------------------------- Pain Assessment Details Patient Name: Gene Brown Date of Service:  01/20/2022 11:15 AM Medical Record Number: [id number] Patient Account Number: [id number] Date of Birth/Sex: June 30, 1951 (71 y.o. M) Treating RN: Cornell Barman Primary Care Nekoda Chock: Tracie Harrier Other Clinician: Massie Kluver Referring Lesli Issa: Tracie Harrier Treating Ethel Meisenheimer/Extender: Skipper Cliche in Treatment: 1 Active Problems Location of Pain Severity and Description of Pain Patient Has Paino No Site Locations Pain Management and Medication Current Pain Management: Electronic Signature(s) Signed: 01/20/2022 4:07:29 PM By: Massie Kluver Signed: 01/20/2022 5:09:42 PM By: [name], BSN, RN, CWS, Kim RN, BSN Entered By: Massie Kluver on 01/20/2022 11:46:46 Gene Brown ([id number]) -------------------------------------------------------------------------------- Patient/Caregiver Education Details Patient Name: Gene Brown Date of Service: 01/20/2022 11:15 AM Medical Record Number: [id number] Patient Account Number: [id number] Date of Birth/Gender: [birth year] (71 y.o. M) Treating RN: Cornell Barman Primary Care Physician: Tracie Harrier Other Clinician: Massie Kluver Referring Physician: Tracie Harrier Treating Physician/Extender: Skipper Cliche in Treatment: 1 Education Assessment Education Provided To: Patient Education Topics Provided Infection: Handouts: Infection Prevention and Management Methods: Explain/Verbal Responses: State content correctly Wound Debridement: Handouts: Wound Debridement Methods: Explain/Verbal Responses: State content correctly Electronic Signature(s) Signed: 01/20/2022 4:07:29 PM By: Massie Kluver Entered By: Massie Kluver on 01/20/2022 12:27:35 Gene Brown ([id number]) -------------------------------------------------------------------------------- Wound Assessment Details Patient Name: Gene Brown Date of Service: 01/20/2022 11:15 AM Medical Record Number: [id number]  Patient Account Number: [id number] Date  of Birth/Sex: Oct 06, 1950 (71 y.o. M) Treating RN: Cornell Barman Primary Care Laaibah Wartman: Tracie Harrier Other Clinician: Massie Kluver Referring Cadince Hilscher: Tracie Harrier Treating Minerva Bluett/Extender: Skipper Cliche in Treatment: 1 Wound Status Wound Number: 2 Primary Diabetic Wound/Ulcer of the Lower Extremity Etiology: Wound Location: Left, Plantar Toe Great Wound Open Wounding Event: Gradually Appeared Status: Date Acquired: 12/09/2021 Comorbid Cataracts, Sickle Cell Disease, Sleep Apnea, Weeks Of Treatment: 1 History: Hypertension, Type II Diabetes, Gout, Osteoarthritis Clustered Wound: No Photos Wound Measurements Length: (cm) 1 Width: (cm) 1.4 Depth: (cm) 0.4 Area: (cm) 1.1 Volume: (cm) 0.44 % Reduction in Area: -288.7% % Reduction in Volume: -212.1% Epithelialization: None Wound Description Classification: Grade 2 Wound Margin: Flat and Intact Exudate Amount: Medium Exudate Type: Serous Exudate Color: amber Foul Odor After Cleansing: No Slough/Fibrino Yes Wound Bed Granulation Amount: Large (67-100%) Exposed Structure Granulation Quality: Pink Fascia Exposed: No Necrotic Amount: Small (1-33%) Fat Layer (Subcutaneous Tissue) Exposed: Yes Necrotic Quality: Adherent Slough Tendon Exposed: No Muscle Exposed: No Joint Exposed: No Bone Exposed: No Treatment Notes Wound #2 (Toe Great) Wound Laterality: Plantar, Left Cleanser Soap and Water Discharge Instruction: Gently cleanse wound with antibacterial soap, rinse and pat dry prior to dressing wounds Peri-Wound Care RENAN, CORWELL ([id number]) Topical Primary Dressing Silvercel Small 2x2 (in/in) Discharge Instruction: Apply Silvercel Small 2x2 (in/in) as instructed Secondary Dressing Conforming [place] Roll-Small Discharge Instruction: Apply Conforming Stretch Guaze Bandage as directed Secured With Medipore Tape - 93M Medipore H Soft Cloth Surgical Tape, 2x2 (in/yd) Compression Wrap Compression  Stockings Add-Ons Electronic Signature(s) Signed: 01/20/2022 4:07:29 PM By: Massie Kluver Signed: 01/20/2022 5:09:42 PM By: [name], BSN, RN, CWS, Kim RN, BSN Entered By: Massie Kluver on 01/20/2022 11:56:08 Gene Brown ([id number]) -------------------------------------------------------------------------------- [place] Details Patient Name: Gene Brown Date of Service: 01/20/2022 11:15 AM Medical Record Number: [id number] Patient Account Number: [id number] Date of Birth/Sex: [birth year] (71 y.o. M) Treating RN: Cornell Barman Primary Care Souleymane Saiki: Tracie Harrier Other Clinician: Massie Kluver Referring Atley Scarboro: Tracie Harrier Treating Carleena Mires/Extender: Skipper Cliche in Treatment: 1 Vital Signs Time Taken: 11:44 Temperature (F): 97.8 Height (in): 68 Pulse (bpm): 67 Weight (lbs): 217 Respiratory Rate (breaths/min): 16 Body Mass Index (BMI): 33 Blood Pressure (mmHg): 158/80 Reference Range: 80 - 120 mg / dl Electronic Signature(s) Signed: 01/20/2022 4:07:29 PM By: Massie Kluver Entered By: Massie Kluver on 01/20/2022 11:46:37

## 2022-01-20 NOTE — Progress Notes (Addendum)
Gene Brown, Gene Brown ([id number]) Visit Report for 01/20/2022 Chief Complaint Document Details Patient Name: Gene Brown, Gene Brown Date of Service: 01/20/2022 11:15 AM Medical Record Number: [id number] Patient Account Number: [id number] Date of Birth/Sex: Dec 19, 1950 (71 y.o. M) Treating RN: Huel Coventry Primary Care Provider: Barbette Reichmann Other Clinician: Betha Loa Referring Provider: Barbette Reichmann Treating Provider/Extender: Rowan Blase in Treatment: 1 Information Obtained from: Patient Chief Complaint Left great toe ulcer Electronic Signature(s) Signed: 01/20/2022 11:11:07 AM By: Lenda Kelp PA-C Entered By: Lenda Kelp on 01/20/2022 11:11:07 Gene Brown ([id number]) -------------------------------------------------------------------------------- Debridement Details Patient Name: Gene Brown Date of Service: 01/20/2022 11:15 AM Medical Record Number: [id number] Patient Account Number: [id number] Date of Birth/Sex: February 13, 1951 (71 y.o. M) Treating RN: Huel Coventry Primary Care Provider: Barbette Reichmann Other Clinician: Betha Loa Referring Provider: Barbette Reichmann Treating Provider/Extender: Rowan Blase in Treatment: 1 Debridement Performed for Wound #2 Left,Plantar Toe Great Assessment: Performed By: Physician Nelida Meuse., PA-C Debridement Type: Debridement Severity of Tissue Pre Debridement: Fat layer exposed Level of Consciousness (Pre- Awake and Alert procedure): Pre-procedure Verification/Time Out Yes - 12:03 Taken: Start Time: 12:03 Total Area Debrided (L x W): 2 (cm) x 3 (cm) = 6 (cm) Tissue and other material Viable, Non-Viable, Callus, Slough, Subcutaneous, Slough debrided: Level: Skin/Subcutaneous Tissue Debridement Description: Excisional Instrument: Curette Bleeding: Minimum Hemostasis Achieved: Pressure End Time: 12:10 Response to Treatment: Procedure was tolerated well Level of Consciousness (Post- Awake and  Alert procedure): Post Debridement Measurements of Total Wound Length: (cm) 1.6 Width: (cm) 1.8 Depth: (cm) 0.5 Volume: (cm) 1.131 Character of Wound/Ulcer Post Debridement: Stable Severity of Tissue Post Debridement: Fat layer exposed Post Procedure Diagnosis Same as Pre-procedure Electronic Signature(s) Signed: 01/20/2022 4:07:29 PM By: Betha Loa Signed: 01/20/2022 4:11:01 PM By: Lenda Kelp PA-C Signed: 01/20/2022 5:09:42 PM By: Elliot Gurney, BSN, RN, CWS, Kim RN, BSN Entered By: Betha Loa on 01/20/2022 12:12:20 Gene Brown ([id number]) -------------------------------------------------------------------------------- HPI Details Patient Name: Gene Brown Date of Service: 01/20/2022 11:15 AM Medical Record Number: [id number] Patient Account Number: [id number] Date of Birth/Sex: June 14, 1951 (71 y.o. M) Treating RN: Huel Coventry Primary Care Provider: Barbette Reichmann Other Clinician: Betha Loa Referring Provider: Barbette Reichmann Treating Provider/Extender: Rowan Blase in Treatment: 1 History of Present Illness HPI Description: 05/24/16; this is a 71 year old type II diabetic who probably has some degree of polyneuropathy. He tells me that he has had an area on the plantar aspect of his left great toe that is been followed by podiatry for most the last year. He has a lot of callus, the podiatrist which shave this down put a dressing on and he would follow pad monthly or sometimes longer intervals. He has not noted any drainage or pain. He has not had a recent x-ray. No serious attempt at offloading this with modified footwear.. Last hemoglobin A1c that he knows about was over 10 a month ago he is working hard on this with his primary physician. More recently he has been soaking this with peroxide ABIs are noncompressible and the left leg. He was told by his podiatrist he has a bone spur underneath the wound but did not wish any surgical procedures. He has a  caregiver at home for a very disabled wife, the nature of her illness is not certain 05/31/16 patient comes in much the same as last week amounts of callus nonviable subcutaneous tissue which required debridement. His x-ray suggested cortical bone irregularity. He will need an MRI. He tells me he has  old screws/hardware left knee however he thinks he has had a subsequent MRI after this 06/07/16 at this point in time patient has been attempting to offload his wound as much as he could on his own. He has made a conscious effort to avoid placing any pressure on the toe as he was moving around and walking and I do believe this has payed off and what I am seeing at this point in time. overall the wound appears to be somewhat improved in my opinion he is not having any significant discomfort at this point in time. In fact he tells me he has no pain at all. There is no interval sign of surrounding infection in the wound itself has actually decreased in size. Unfortunately the MRI was not ordered last week and therefore we do need to proceed with the MRI order today. again this is to evaluate the cortical bone irregularity noted on x-ray. 06/14/16 Patient has continued to use the offloading shoe at this point in time. He tells me this is somewhat difficult to get used to walking and but nonetheless he is very strict about wearing it. Again I believe this has paid off very well for him and his wound appears to be greatly improved. He does have some callused area surrounding the wound proximally that has caused some fluid collection of maceration of the callused area. This likely will require debridement today. Some adherent slough noted over the wound bed. 06/21/16; patient's wound on the plantar aspect of his left great toe is totally epithelialized. His MRI of the foot is tomorrow to follow-up on the cortical irregularity noted on plain x-ray. He does not have diabetic foot where 06-28-16-He comes in today  with a continued healed left great toe. MRI performed on 06/23/2016 shows no abscess osteomyelitis or septic joint. Patient was discharged from wound care center today. Readmission: 01-07-2022 upon evaluation today patient appears to be doing somewhat poorly in regard to total ulcer on his left foot. Fortunately there does not appear to be any signs of infection which is great news. Has been seen by Dr. Excell Seltzer and he did request from Dr. Excell Seltzer to come see [place] due to the fact that in the past several years back we were able to get one of his wounds healed about 4 weeks. Again I explained that that is always variable but nonetheless I definitely think we can help. Fortunately I see no evidence of infection. He does have a foot offloading shoe which I think is good to attempt for now he was able to get this healed before with a peg assist. Patient does have a history of type 2 diabetes, diabetic polyneuropathy, and hypertension. This current wound started on 12-09-2021. 01-20-2022 upon evaluation today patient appears to be doing poorly in regard to his great toe. I am actually very concerned based on what I am seeing here at this point about how things are progressing. I do not believe there is any evidence of infection systemically that is obvious but locally I am concerned about the possibility this could even be an osteomyelitis type situation. Electronic Signature(s) Signed: 01/20/2022 1:26:19 PM By: Lenda Kelp PA-C Entered By: Lenda Kelp on 01/20/2022 13:26:19 Gene Brown ([id number]) -------------------------------------------------------------------------------- Physical Exam Details Patient Name: Gene Brown Date of Service: 01/20/2022 11:15 AM Medical Record Number: [id number] Patient Account Number: [id number] Date of Birth/Sex: 1950-12-21 (71 y.o. M) Treating RN: Huel Coventry Primary Care Provider: Barbette Reichmann Other Clinician: Betha Loa Referring  Provider: Barbette Reichmann Treating Provider/Extender: Rowan Blase in Treatment: 1 Constitutional Obese and well-hydrated in no acute distress. Respiratory normal breathing without difficulty. Psychiatric this patient is able to make decisions and demonstrates good insight into disease process. Alert and Oriented x 3. pleasant and cooperative. Notes Upon inspection patient's wound is significantly worse than last week when I saw him. Subsequently we are going to need to see about doing a x-ray at this point I held off the first visit as I was hoping would be able to get this to heal fairly rapidly. Its become increasingly obvious that skin to be probably unlikely least not quickly and that there could be something more going on for that reason I do want to send him for an x-ray today also can obtain a wound culture which was performed in the deep section of the wound postdebridement Electronic Signature(s) Signed: 01/20/2022 1:29:30 PM By: Lenda Kelp PA-C Entered By: Lenda Kelp on 01/20/2022 13:29:30 Gene Brown ([id number]) -------------------------------------------------------------------------------- Physician Orders Details Patient Name: Gene Brown Date of Service: 01/20/2022 11:15 AM Medical Record Number: [id number] Patient Account Number: [id number] Date of Birth/Sex: 1950-12-19 (71 y.o. M) Treating RN: Huel Coventry Primary Care Provider: Barbette Reichmann Other Clinician: Betha Loa Referring Provider: Barbette Reichmann Treating Provider/Extender: Rowan Blase in Treatment: 1 Verbal / Phone Orders: No Diagnosis Coding ICD-10 Coding Code Description E11.621 Type 2 diabetes mellitus with foot ulcer 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osed E11.42 Type 2 diabetes mellitus with diabetic polyneuropathy I10 Essential (primary) hypertension Follow-up Appointments Wound #2 Left,Plantar Toe Great o Return Appointment  in 1 week. o Nurse Visit as needed Bathing/ Shower/ Hygiene o May shower; gently cleanse wound with antibacterial soap, rinse and pat dry prior to dressing wounds o No tub bath. Anesthetic (Use 'Patient Medications' Section for Anesthetic Order Entry) o Lidocaine applied to wound bed Edema Control - Lymphedema / Segmental Compressive Device / Other o Compression Pump: Use compression pump on left lower extremity for 60 minutes, twice daily. o Compression Pump: Use compression pump on right lower extremity for 60 minutes, twice daily. o DO YOUR BEST to sleep in the bed at night. DO NOT sleep in your recliner. Long hours of sitting in a recliner leads to swelling of the legs and/or potential wounds on your backside. Off-Loading o Offloading felt to foot. - donut cut in offloading felt o Other: - front off-loader left Additional Orders / Instructions o Follow Nutritious Diet and Increase Protein Intake o Activity as tolerated Wound Treatment Wound #2 - Toe Great Wound Laterality: Plantar, Left Cleanser: Soap and Water 3 x Per Week/15 Days Discharge Instructions: Gently cleanse wound with antibacterial soap, rinse and pat dry prior to dressing wounds Primary Dressing: Silvercel Small 2x2 (in/in) (Generic) 3 x Per Week/15 Days Discharge Instructions: Apply Silvercel Small 2x2 (in/in) as instructed Secondary Dressing: Conforming Guaze Roll-Small (Generic) 3 x Per Week/15 Days Discharge Instructions: Apply Conforming Stretch Guaze Bandage as directed Secured With: Medipore Tape - 75M Medipore H Soft Cloth Surgical Tape, 2x2 (in/yd) (Generic) 3 x Per Week/15 Days Laboratory o Bacteria identified in Wound by Culture (MICRO) - left great toe oooo LOINC Code: 6462-6 oooo Convenience Name: Wound culture routine Gene Brown, Gene Brown ([id number]) Radiology o X-ray, foot - left foot, please focus on left great toe Electronic Signature(s) Signed: 01/20/2022 4:07:29 PM By:  Betha Loa Signed: 01/20/2022 4:11:01 PM By: Lenda Kelp PA-C Entered By: Glynda Jaeger,  Angie on 01/20/2022 12:26:27 Gene Brown, Vin V. ([id number]) -------------------------------------------------------------------------------- Problem List Details Patient Name: Gene Brown, Gene V. Date of Service: 01/20/2022 11:15 AM Medical Record Number: [id number] Patient Account Number: [id number] Date of Birth/Sex: 10/12/1950 (71 y.o. M) Treating RN: Huel CoventryWoody, Kim Primary Care Provider: Barbette ReichmannHande, Vishwanath Other Clinician: Betha LoaVenable, Angie Referring Provider: Barbette ReichmannHande, Vishwanath Treating Provider/Extender: Rowan BlaseStone, Urian Martenson Weeks in Treatment: 1 Active Problems ICD-10 Encounter Code Description Active Date MDM Diagnosis E11.621 Type 2 diabetes mellitus with foot ulcer 01/07/2022 No Yes L97.522 Non-pressure chronic ulcer of other part of left foot with fat layer 01/07/2022 No Yes exposed E11.42 Type 2 diabetes mellitus with diabetic polyneuropathy 01/07/2022 No Yes I10 Essential (primary) hypertension 01/07/2022 No Yes Inactive Problems Resolved Problems Electronic Signature(s) Signed: 01/20/2022 11:11:03 AM By: Lenda KelpStone III, Kaylah Chiasson PA-C Entered By: Lenda KelpStone III, Shavonne Ambroise on 01/20/2022 11:11:03 Gene Brown, Gene V. ([id number]) -------------------------------------------------------------------------------- Progress Note Details Patient Name: Gene Brown, Gene V. Date of Service: 01/20/2022 11:15 AM Medical Record Number: [id number] Patient Account Number: [id number] Date of Birth/Sex: 07/19/1951 (71 y.o. M) Treating RN: Huel CoventryWoody, Kim Primary Care Provider: Barbette ReichmannHande, Vishwanath Other Clinician: Betha LoaVenable, Angie Referring Provider: Barbette ReichmannHande, Vishwanath Treating Provider/Extender: Rowan BlaseStone, Finleigh Cheong Weeks in Treatment: 1 Subjective Chief Complaint Information obtained from Patient Left great toe ulcer History of Present Illness (HPI) 05/24/16; this is a 71 year old type II diabetic who probably has some degree of polyneuropathy. He tells me that he  has had an area on the plantar aspect of his left great toe that is been followed by podiatry for most the last year. He has a lot of callus, the podiatrist which shave this down put a dressing on and he would follow pad monthly or sometimes longer intervals. He has not noted any drainage or pain. He has not had a recent x-ray. No serious attempt at offloading this with modified footwear.. Last hemoglobin A1c that he knows about was over 10 a month ago he is working hard on this with his primary physician. More recently he has been soaking this with peroxide ABIs are noncompressible and the left leg. He was told by his podiatrist he has a bone spur underneath the wound but did not wish any surgical procedures. He has a caregiver at home for a very disabled wife, the nature of her illness is not certain 05/31/16 patient comes in much the same as last week amounts of callus nonviable subcutaneous tissue which required debridement. His x-ray suggested cortical bone irregularity. He will need an MRI. He tells me he has old screws/hardware left knee however he thinks he has had a subsequent MRI after this 06/07/16 at this point in time patient has been attempting to offload his wound as much as he could on his own. He has made a conscious effort to avoid placing any pressure on the toe as he was moving around and walking and I do believe this has payed off and what I am seeing at this point in time. overall the wound appears to be somewhat improved in my opinion he is not having any significant discomfort at this point in time. In fact he tells me he has no pain at all. There is no interval sign of surrounding infection in the wound itself has actually decreased in size. Unfortunately the MRI was not ordered last week and therefore we do need to proceed with the MRI order today. again this is to evaluate the cortical bone irregularity noted on x-ray. 06/14/16 Patient has continued to use the offloading  shoe at this point in time.  He tells me this is somewhat difficult to get used to walking and but nonetheless he is very strict about wearing it. Again I believe this has paid off very well for him and his wound appears to be greatly improved. He does have some callused area surrounding the wound proximally that has caused some fluid collection of maceration of the callused area. This likely will require debridement today. Some adherent slough noted over the wound bed. 06/21/16; patient's wound on the plantar aspect of his left great toe is totally epithelialized. His MRI of the foot is tomorrow to follow-up on the cortical irregularity noted on plain x-ray. He does not have diabetic foot where 06-28-16-He comes in today with a continued healed left great toe. MRI performed on 06/23/2016 shows no abscess osteomyelitis or septic joint. Patient was discharged from wound care center today. Readmission: 01-07-2022 upon evaluation today patient appears to be doing somewhat poorly in regard to total ulcer on his left foot. Fortunately there does not appear to be any signs of infection which is great news. Has been seen by Dr. Excell Seltzer and he did request from Dr. Excell Seltzer to come see [place] due to the fact that in the past several years back we were able to get one of his wounds healed about 4 weeks. Again I explained that that is always variable but nonetheless I definitely think we can help. Fortunately I see no evidence of infection. He does have a foot offloading shoe which I think is good to attempt for now he was able to get this healed before with a peg assist. Patient does have a history of type 2 diabetes, diabetic polyneuropathy, and hypertension. This current wound started on 12-09-2021. 01-20-2022 upon evaluation today patient appears to be doing poorly in regard to his great toe. I am actually very concerned based on what I am seeing here at this point about how things are progressing. I do not believe there  is any evidence of infection systemically that is obvious but locally I am concerned about the possibility this could even be an osteomyelitis type situation. Objective Constitutional Obese and well-hydrated in no acute distress. Vitals Time Taken: 11:44 AM, Height: 68 in, Weight: 217 lbs, BMI: 33, Temperature: 97.8 F, Pulse: 67 bpm, Respiratory Rate: 16 breaths/min, Blood Pressure: 158/80 mmHg. Respiratory Gene Brown, Gene Brown ([id number]) normal breathing without difficulty. Psychiatric this patient is able to make decisions and demonstrates good insight into disease process. Alert and Oriented x 3. pleasant and cooperative. General Notes: Upon inspection patient's wound is significantly worse than last week when I saw him. Subsequently we are going to need to see about doing a x-ray at this point I held off the first visit as I was hoping would be able to get this to heal fairly rapidly. Its become increasingly obvious that skin to be probably unlikely least not quickly and that there could be something more going on for that reason I do want to send him for an x-ray today also can obtain a wound culture which was performed in the deep section of the wound postdebridement Integumentary (Hair, Skin) Wound #2 status is Open. Original cause of wound was Gradually Appeared. The date acquired was: 12/09/2021. The wound has been in treatment 1 weeks. The wound is located on the [employer]. The wound measures 1cm length x 1.4cm width x 0.4cm depth; 1.1cm^2 area and 0.44cm^3 volume. There is Fat Layer (Subcutaneous Tissue) exposed. There is a medium amount of serous drainage noted.  The wound margin is flat and intact. There is large (67-100%) pink granulation within the wound bed. There is a small (1-33%) amount of necrotic tissue within the wound bed including Adherent Slough. Assessment Active Problems ICD-10 Type 2 diabetes mellitus with foot ulcer Non-pressure chronic ulcer of other  part of left foot with fat layer exposed Type 2 diabetes mellitus with diabetic polyneuropathy Essential (primary) hypertension Procedures Wound #2 Pre-procedure diagnosis of Wound #2 is a Diabetic Wound/Ulcer of the Lower Extremity located on the Left,Plantar Toe Great .Severity of Tissue Pre Debridement is: Fat layer exposed. There was a Excisional Skin/Subcutaneous Tissue Debridement with a total area of 6 sq cm performed by Nelida Meuse., PA-C. With the following instrument(s): Curette to remove Viable and Non-Viable tissue/material. Material removed includes Callus, Subcutaneous Tissue, and Slough. A time out was conducted at 12:03, prior to the start of the procedure. A Minimum amount of bleeding was controlled with Pressure. The procedure was tolerated well. Post Debridement Measurements: 1.6cm length x 1.8cm width x 0.5cm depth; 1.131cm^3 volume. Character of Wound/Ulcer Post Debridement is stable. Severity of Tissue Post Debridement is: Fat layer exposed. Post procedure Diagnosis Wound #2: Same as Pre-Procedure Plan Follow-up Appointments: Wound #2 Left,Plantar Toe Great: Return Appointment in 1 week. Nurse Visit as needed Bathing/ Shower/ Hygiene: May shower; gently cleanse wound with antibacterial soap, rinse and pat dry prior to dressing wounds No tub bath. Anesthetic (Use 'Patient Medications' Section for Anesthetic Order Entry): Lidocaine applied to wound bed Edema Control - Lymphedema / Segmental Compressive Device / Other: Compression Pump: Use compression pump on left lower extremity for 60 minutes, twice daily. Compression Pump: Use compression pump on right lower extremity for 60 minutes, twice daily. DO YOUR BEST to sleep in the bed at night. DO NOT sleep in your recliner. Long hours of sitting in a recliner leads to swelling of the legs and/or potential wounds on your backside. Off-Loading: Offloading felt to foot. - donut cut in offloading felt Other: - front  off-loader left Additional Orders / Instructions: Follow Nutritious Diet and Increase Protein Intake Activity as tolerated Gene Brown, Gene Brown ([id number]) Radiology ordered were: X-ray, foot - left foot, please focus on left great toe Laboratory ordered were: Wound culture routine - left great toe WOUND #2: - Toe Great Wound Laterality: Plantar, Left Cleanser: Soap and Water 3 x Per Week/15 Days Discharge Instructions: Gently cleanse wound with antibacterial soap, rinse and pat dry prior to dressing wounds Primary Dressing: Silvercel Small 2x2 (in/in) (Generic) 3 x Per Week/15 Days Discharge Instructions: Apply Silvercel Small 2x2 (in/in) as instructed Secondary Dressing: Conforming Guaze Roll-Small (Generic) 3 x Per Week/15 Days Discharge Instructions: Apply Conforming Stretch Guaze Bandage as directed Secured With: Medipore Tape - 34M Medipore H Soft Cloth Surgical Tape, 2x2 (in/yd) (Generic) 3 x Per Week/15 Days 1. I would recommend currently that we go ahead and have the patient go for the x-ray today. I think this is good to be the best thing to do at this point. 2. I am also can recommend that we have the patient monitor for any signs of worsening or infection. With that being said I think that he will also need the results of the culture and based on that we will make any adjustments as we need to with regard to using antibiotics. He is in agreement with that plan. We will see patient back for reevaluation in 1 week here in the clinic. If anything worsens or changes patient will contact our  office for additional recommendations. Electronic Signature(s) Signed: 01/20/2022 1:48:00 PM By: Lenda Kelp PA-C Entered By: Lenda Kelp on 01/20/2022 13:47:59 Gene Brown ([id number]) -------------------------------------------------------------------------------- SuperBill Details Patient Name: Gene Brown Date of Service: 01/20/2022 Medical Record Number:  [id number] Patient Account Number: [id number] Date of Birth/Sex: 1950-11-15 (71 y.o. M) Treating RN: Huel Coventry Primary Care Provider: Barbette Reichmann Other Clinician: Betha Loa Referring Provider: Barbette Reichmann Treating Provider/Extender: Rowan Blase in Treatment: 1 Diagnosis Coding ICD-10 Codes Code Description E11.621 Type 2 diabetes mellitus with foot ulcer 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osed E11.42 Type 2 diabetes mellitus with diabetic polyneuropathy I10 Essential (primary) hypertension Facility Procedures CPT4 Code: [id number] Description: 11042 - DEB SUBQ TISSUE 20 SQ CM/< Modifier: Quantity: 1 CPT4 Code: Description: ICD-10 Diagnosis Description 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 Modifier: osed Quantity: Physician Procedures CPT4 Code: 8144818 Description: 99214 - WC PHYS LEVEL 4 - EST PT Modifier: 25 Quantity: 1 CPT4 Code: Description: ICD-10 Diagnosis Description E11.621 Type 2 diabetes mellitus with foot ulcer 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

## 2022-01-23 LAB — AEROBIC CULTURE W GRAM STAIN (SUPERFICIAL SPECIMEN): Gram Stain: NONE SEEN

## 2022-01-28 ENCOUNTER — Encounter: Payer: Medicare Other | Admitting: Physician Assistant

## 2022-01-28 DIAGNOSIS — E11621 Type 2 diabetes mellitus with foot ulcer: Secondary | ICD-10-CM | POA: Diagnosis not present

## 2022-02-04 ENCOUNTER — Encounter: Payer: Medicare Other | Admitting: Physician Assistant

## 2022-02-04 DIAGNOSIS — E11621 Type 2 diabetes mellitus with foot ulcer: Secondary | ICD-10-CM | POA: Diagnosis not present

## 2022-02-04 NOTE — Progress Notes (Addendum)
Gene Brown, Gene Brown ([id number]) Visit Report for 02/04/2022 Arrival Information Details Patient Name: Gene Brown, Gene Brown Date of Service: 02/04/2022 10:45 AM Medical Record Number: [id number] Patient Account Number: [id number] Date of Birth/Sex: 1951/05/19 (71 y.o. M) Treating RN: Gene Brown Primary Care Gene Brown: Gene Brown Other Clinician: Referring Gene Brown: Gene Brown Treating Gene Brown/Extender: Gene Brown in Treatment: 4 Visit Information History Since Last Visit Added or deleted any medications: No Patient Arrived: Cane Any new allergies or adverse reactions: No Arrival Time: 10:59 Had a fall or experienced change in No Transfer Assistance: None activities of daily living that may affect Patient Has Alerts: Yes risk of falls: Patient Alerts: Patient on Blood Thinner Hospitalized since last visit: No 01/07/2022 [place]>220 on left Has Dressing in Place as Prescribed: Yes Pain Present Now: No Electronic Signature(s) Signed: 02/04/2022 4:01:56 PM By: Gene Brown Entered By: Gene Brown on 02/04/2022 11:09:20 Gene Brown ([id number]) -------------------------------------------------------------------------------- Clinic Level of Care Assessment Details Patient Name: Gene Brown Date of Service: 02/04/2022 10:45 AM Medical Record Number: [id number] Patient Account Number: [id number] Date of Birth/Sex: 1951-02-23 (71 y.o. M) Treating RN: Gene Brown Primary Care Gene Brown: Gene Brown Other Clinician: Referring Gene Brown: Gene Brown Treating Gene Brown/Extender: Gene Brown in Treatment: 4 Clinic Level of Care Assessment Items TOOL 1 Quantity Score []  - Use when EandM and Procedure is performed on INITIAL visit 0 ASSESSMENTS - Nursing Assessment / Reassessment []  - General Physical Exam (combine w/ comprehensive assessment (listed just below) when performed on new 0 pt. evals) []  - 0 Comprehensive Assessment (HX, ROS, Risk Assessments,  Wounds Hx, etc.) ASSESSMENTS - Wound and Skin Assessment / Reassessment []  - Dermatologic / Skin Assessment (not related to wound area) 0 ASSESSMENTS - Ostomy and/or Continence Assessment and Care []  - Incontinence Assessment and Management 0 []  - 0 Ostomy Care Assessment and Management (repouching, etc.) PROCESS - Coordination of Care []  - Simple Patient / Family Education for ongoing care 0 []  - 0 Complex (extensive) Patient / Family Education for ongoing care []  - 0 Staff obtains Programmer, systems, Records, Test Results / Process Orders []  - 0 Staff telephones HHA, Nursing Homes / Clarify orders / etc []  - 0 Routine Transfer to another Facility (non-emergent condition) []  - 0 Routine Hospital Admission (non-emergent condition) []  - 0 New Admissions / Biomedical engineer / Ordering NPWT, Apligraf, etc. []  - 0 Emergency Hospital Admission (emergent condition) PROCESS - Special Needs []  - Pediatric / Minor Patient Management 0 []  - 0 Isolation Patient Management []  - 0 Hearing / Language / Visual special needs []  - 0 Assessment of Community assistance (transportation, D/C planning, etc.) []  - 0 Additional assistance / Altered mentation []  - 0 Support Surface(s) Assessment (bed, cushion, seat, etc.) INTERVENTIONS - Miscellaneous []  - External ear exam 0 []  - 0 Patient Transfer (multiple staff / Civil Service fast streamer / Similar devices) []  - 0 Simple Staple / Suture removal (25 or less) []  - 0 Complex Staple / Suture removal (26 or more) []  - 0 Hypo/Hyperglycemic Management (do not check if billed separately) []  - 0 Ankle / Brachial Index (ABI) - do not check if billed separately Has the patient been seen at the hospital within the last three years: Yes Total Score: 0 Level Of Care: ____ Gene Brown ([id number]) Electronic Signature(s) Signed: 02/04/2022 4:01:56 PM By: Gene Brown Entered By: Gene Brown on 02/04/2022 11:34:53 Gene Brown  ([id number]) -------------------------------------------------------------------------------- Encounter Discharge Information Details Patient Name: Gene Brown Date of Service: 02/04/2022 10:45 AM Medical  Record Number: [id number] Patient Account Number: [id number] Date of Birth/Sex: Nov 09, 1950 (71 y.o. M) Treating RN: Gene Brown Primary Care Gene Brown: Gene Brown Other Clinician: Referring Gene Brown: Gene Brown Treating Gene Brown/Extender: Gene Brown in Treatment: 4 Encounter Discharge Information Items Post Procedure Vitals Discharge Condition: Stable Temperature (F): 98.2 Ambulatory Status: Cane Pulse (bpm): 62 Discharge Destination: Home Respiratory Rate (breaths/min): 18 Transportation: Private Auto Blood Pressure (mmHg): 182/81 Accompanied By: self Schedule Follow-up Appointment: Yes Clinical Summary of Care: Notes Patient had not taken blood pressure medication at time of appointment. Electronic Signature(s) Signed: 02/04/2022 4:01:56 PM By: Gene Brown Entered By: Gene Brown on 02/04/2022 11:53:18 Gene Brown ([id number]) -------------------------------------------------------------------------------- Lower Extremity Assessment Details Patient Name: Gene Brown Date of Service: 02/04/2022 10:45 AM Medical Record Number: [id number] Patient Account Number: [id number] Date of Birth/Sex: [birth year] (71 y.o. M) Treating RN: Gene Brown Primary Care Gene Brown: Gene Brown Other Clinician: Referring Gene Brown: Gene Brown Treating Gene Brown/Extender: Gene Brown in Treatment: 4 Edema Assessment Assessed: [Left: No] [Right: No] [Left: Edema] [Right: :] Calf Left: Right: Point of Measurement: 29 cm From Medial Instep 45 cm Ankle Left: Right: Point of Measurement: 9 cm From Medial Instep 30 cm Vascular Assessment Pulses: Dorsalis Pedis Palpable: [Left:Yes] Electronic Signature(s) Signed: 02/04/2022 4:01:56 PM By:  Gene Brown Entered By: Gene Brown on 02/04/2022 11:22:23 Gene Brown ([id number]) -------------------------------------------------------------------------------- Multi Wound Chart Details Patient Name: Gene Brown Date of Service: 02/04/2022 10:45 AM Medical Record Number: [id number] Patient Account Number: [id number] Date of Birth/Sex: 1951/05/08 (71 y.o. M) Treating RN: Gene Brown Primary Care Ario Mcdiarmid: Gene Brown Other Clinician: Referring Alyissa Whidbee: Gene Brown Treating Rennie Rouch/Extender: Gene Brown in Treatment: 4 Vital Signs Height(in): 68 Pulse(bpm): 57 Weight(lbs): 458 Blood Pressure(mmHg): 182/81 Body Mass Index(BMI): 33 Temperature(F): 98.2 Respiratory Rate(breaths/min): 18 Photos: [N/A:N/A] Wound Location: Left, Plantar Toe Great N/A N/A Wounding Event: Gradually Appeared N/A N/A Primary Etiology: Diabetic Wound/Ulcer of the Lower N/A N/A Extremity Comorbid History: Cataracts, Sickle Cell Disease, Sleep N/A N/A Apnea, Hypertension, Type II Diabetes, Gout, Osteoarthritis Date Acquired: 12/09/2021 N/A N/A Weeks of Treatment: 4 N/A N/A Wound Status: Open N/A N/A Wound Recurrence: No N/A N/A Measurements L x W x D (cm) 1.6x1.5x0.3 N/A N/A Area (cm) : 1.885 N/A N/A Volume (cm) : 0.565 N/A N/A % Reduction in Area: -566.10% N/A N/A % Reduction in Volume: -300.70% N/A N/A Classification: Grade 2 N/A N/A Exudate Amount: Medium N/A N/A Exudate Type: Serous N/A N/A Exudate Color: amber N/A N/A Wound Margin: Flat and Intact N/A N/A Granulation Amount: Large (67-100%) N/A N/A Granulation Quality: Pink N/A N/A Necrotic Amount: Small (1-33%) N/A N/A Exposed Structures: Fat Layer (Subcutaneous Tissue): N/A N/A Yes Fascia: No Tendon: No Muscle: No Joint: No Bone: No Epithelialization: None N/A N/A Treatment Notes Electronic Signature(s) Signed: 02/04/2022 4:01:56 PM By: Gene Brown Entered By: Gene Brown on 02/04/2022  11:22:39 Gene Brown ([id number] Brown ([id number]) -------------------------------------------------------------------------------- Multi-Disciplinary Care Plan Details Patient Name: Gene Brown Date of Service: 02/04/2022 10:45 AM Medical Record Number: [id number] Patient Account Number: [id number] Date of Birth/Sex: October 16, 1950 (71 y.o. M) Treating RN: Gene Brown Primary Care Cellie Dardis: Gene Brown Other Clinician: Referring Adison Reifsteck: Gene Brown Treating Mearl Olver/Extender: Gene Brown in Treatment: 4 Active Inactive Necrotic Tissue Nursing Diagnoses: Impaired tissue integrity related to necrotic/devitalized tissue Knowledge deficit related to management of necrotic/devitalized tissue Goals: Necrotic/devitalized tissue will be minimized in the wound bed Date Initiated: 01/07/2022 Target Resolution Date: 01/07/2022 Goal Status: Active Patient/caregiver will verbalize understanding of reason  and process for debridement of necrotic tissue Date Initiated: 01/07/2022 Target Resolution Date: 01/07/2022 Goal Status: Active Interventions: Assess patient pain level pre-, during and post procedure and prior to discharge Provide education on necrotic tissue and debridement process Treatment Activities: Apply topical anesthetic as ordered : 01/07/2022 Excisional debridement : 01/07/2022 Notes: Orientation to the Wound Care Program Nursing Diagnoses: Knowledge deficit related to the wound healing center program Goals: Patient/caregiver will verbalize understanding of the [place] Program Date Initiated: 01/07/2022 Target Resolution Date: 01/07/2022 Goal Status: Active Interventions: Provide education on orientation to the wound center Notes: Peripheral Neuropathy Nursing Diagnoses: Knowledge deficit related to disease process and management of peripheral neurovascular dysfunction Potential alteration in peripheral tissue perfusion (select prior  to confirmation of diagnosis) Goals: Patient/caregiver will verbalize understanding of disease process and disease management Date Initiated: 01/07/2022 Target Resolution Date: 01/07/2022 Goal Status: Active Interventions: Assess signs and symptoms of neuropathy upon admission and as needed Provide education on Management of Neuropathy and Related Ulcers Provide education on Management of Neuropathy upon discharge from the [place]. ([id number]) Notes: Wound/Skin Impairment Nursing Diagnoses: Impaired tissue integrity Knowledge deficit related to smoking impact on wound healing Goals: Patient/caregiver will verbalize understanding of skin care regimen Date Initiated: 01/07/2022 Target Resolution Date: 01/07/2022 Goal Status: Active Ulcer/skin breakdown will have a volume reduction of 30% by week 4 Date Initiated: 01/07/2022 Target Resolution Date: 02/04/2022 Goal Status: Active Ulcer/skin breakdown will have a volume reduction of 50% by week 8 Date Initiated: 01/07/2022 Target Resolution Date: 03/03/2022 Goal Status: Active Interventions: Assess patient/caregiver ability to obtain necessary supplies Assess patient/caregiver ability to perform ulcer/skin care regimen upon admission and as needed Assess ulceration(s) every visit Treatment Activities: Referred to DME Lam Bjorklund for dressing supplies : 01/07/2022 Skin care regimen initiated : 01/07/2022 Topical wound management initiated : 01/07/2022 Notes: Electronic Signature(s) Signed: 02/04/2022 4:01:56 PM By: Gene Brown Entered By: Gene Brown on 02/04/2022 11:22:30 Gene Brown ([id number]) -------------------------------------------------------------------------------- Pain Assessment Details Patient Name: Gene Brown Date of Service: 02/04/2022 10:45 AM Medical Record Number: [id number] Patient Account Number: [id number] Date of Birth/Sex: [birth year] (71 y.o. M) Treating RN: Gene Brown Primary Care  Vonte Rossin: Gene Brown Other Clinician: Referring Micholas Drumwright: Gene Brown Treating Tiyanna Larcom/Extender: Gene Brown in Treatment: 4 Active Problems Location of Pain Severity and Description of Pain Patient Has Paino No Site Locations Pain Management and Medication Current Pain Management: Electronic Signature(s) Signed: 02/04/2022 4:01:56 PM By: Gene Brown Entered By: Gene Brown on 02/04/2022 11:10:15 Gene Brown ([id number]) -------------------------------------------------------------------------------- Patient/Caregiver Education Details Patient Name: Gene Brown Date of Service: 02/04/2022 10:45 AM Medical Record Number: [id number] Patient Account Number: [id number] Date of Birth/Gender: 1951/01/03 (71 y.o. M) Treating RN: Gene Brown Primary Care Physician: Gene Brown Other Clinician: Referring Physician: Tracie Brown Treating Physician/Extender: Gene Brown in Treatment: 4 Education Assessment Education Provided To: Patient Education Topics Provided Wound Debridement: Handouts: Wound Debridement Methods: Demonstration, Explain/Verbal Responses: State content correctly Wound/Skin Impairment: Handouts: Caring for Your Ulcer Methods: Demonstration, Explain/Verbal Responses: State content correctly Electronic Signature(s) Signed: 02/04/2022 4:01:56 PM By: Gene Brown Entered By: Gene Brown on 02/04/2022 11:36:14 Gene Brown ([id number]) -------------------------------------------------------------------------------- Wound Assessment Details Patient Name: Gene Brown Date of Service: 02/04/2022 10:45 AM Medical Record Number: [id number] Patient Account Number: [id number] Date of Birth/Sex: [birth year] (71 y.o. M) Treating RN: Gene Brown Primary Care Apollos Tenbrink: Gene Brown Other Clinician: Referring Korvin Valentine: Gene Brown Treating Elonna Mcfarlane/Extender: Gene Brown in Treatment: 4 Wound Status Wound  Number:  2 Primary Diabetic Wound/Ulcer of the Lower Extremity Etiology: Wound Location: Left, Plantar Toe Great Wound Open Wounding Event: Gradually Appeared Status: Date Acquired: 12/09/2021 Comorbid Cataracts, Sickle Cell Disease, Sleep Apnea, Weeks Of Treatment: 4 History: Hypertension, Type II Diabetes, Gout, Osteoarthritis Clustered Wound: No Photos Wound Measurements Length: (cm) 1.6 Width: (cm) 1.5 Depth: (cm) 0.3 Area: (cm) 1.885 Volume: (cm) 0.565 % Reduction in Area: -566.1% % Reduction in Volume: -300.7% Epithelialization: None Wound Description Classification: Grade 2 Wound Margin: Flat and Intact Exudate Amount: Medium Exudate Type: Serous Exudate Color: amber Foul Odor After Cleansing: No Slough/Fibrino Yes Wound Bed Granulation Amount: Large (67-100%) Exposed Structure Granulation Quality: Pink Fascia Exposed: No Necrotic Amount: Small (1-33%) Fat Layer (Subcutaneous Tissue) Exposed: Yes Necrotic Quality: Adherent Slough Tendon Exposed: No Muscle Exposed: No Joint Exposed: No Bone Exposed: No Treatment Notes Wound #2 (Toe Great) Wound Laterality: Plantar, Left Cleanser [place] Ancillary Kit - 15 Day Supply Discharge Instruction: Use supplies as instructed; Kit contains: (15) Saline Bullets; (15) 3x3 Gauze; 15 pr Gloves Soap and [address] ROBSON, TRICKEY ([id number]) Discharge Instruction: Gently cleanse wound with antibacterial soap, rinse and pat dry prior to dressing wounds Peri-Wound Care Topical Primary Dressing Silvercel Small 2x2 (in/in) Discharge Instruction: Apply Silvercel Small 2x2 (in/in) as instructed Secondary Dressing [place] Roll-Small Discharge Instruction: Apply Conforming Stretch Guaze Bandage as directed Secured With Medipore Tape - 87M Medipore H Soft Cloth Surgical Tape, 2x2 (in/yd) Compression Wrap Compression Stockings Add-Ons Electronic Signature(s) Signed: 02/04/2022 4:01:56 PM By: Gene Brown Entered By: Gene Brown on 02/04/2022 11:20:11 Gene Brown ([id number]) -------------------------------------------------------------------------------- Vitals Details Patient Name: Gene Brown Date of Service: 02/04/2022 10:45 AM Medical Record Number: [id number] Patient Account Number: [id number] Date of Birth/Sex: [birth year] (71 y.o. M) Treating RN: Gene Brown Primary Care Daleah Coulson: Gene Brown Other Clinician: Referring Cristina Mattern: Gene Brown Treating Brin Ruggerio/Extender: Gene Brown in Treatment: 4 Vital Signs Time Taken: 11:00 Temperature (F): 98.2 Height (in): 68 Pulse (bpm): 62 Weight (lbs): 217 Respiratory Rate (breaths/min): 18 Body Mass Index (BMI): 33 Blood Pressure (mmHg): 182/81 Reference Range: 80 - 120 mg / dl Electronic Signature(s) Signed: 02/04/2022 4:01:56 PM By: Gene Brown Entered By: Gene Brown on 02/04/2022 11:10:09

## 2022-02-04 NOTE — Progress Notes (Addendum)
JOVANE, FOUTZ ([id number]) Visit Report for 02/04/2022 Chief Complaint Document Details Patient Name: Gene Brown, Gene Brown Date of Service: 02/04/2022 10:45 AM Medical Record Number: [id number] Patient Account Number: [id number] Date of Birth/Sex: 03-Oct-1950 (71 y.o. M) Treating RN: Alycia Rossetti Primary Care Provider: Tracie Harrier Other Clinician: Referring Provider: Tracie Harrier Treating Provider/Extender: Skipper Cliche in Treatment: 4 Information Obtained from: Patient Chief Complaint Left great toe ulcer Electronic Signature(s) Signed: 02/04/2022 10:45:30 AM By: Worthy Keeler PA-C Entered By: Worthy Keeler on 02/04/2022 10:45:29 Gene Brown ([id number]) -------------------------------------------------------------------------------- Debridement Details Patient Name: Gene Brown Date of Service: 02/04/2022 10:45 AM Medical Record Number: [id number] Patient Account Number: [id number] Date of Birth/Sex: 1950-09-26 (71 y.o. M) Treating RN: Alycia Rossetti Primary Care Provider: Tracie Harrier Other Clinician: Referring Provider: Tracie Harrier Treating Provider/Extender: Skipper Cliche in Treatment: 4 Debridement Performed for Wound #2 Left,Plantar Toe Great Assessment: Performed By: Physician Tommie Sams., PA-C Debridement Type: Debridement Severity of Tissue Pre Debridement: Fat layer exposed Level of Consciousness (Pre- Awake and Alert procedure): Pre-procedure Verification/Time Out Yes - 11:30 Taken: Start Time: 11:30 Pain Control: Lidocaine 4% Topical Solution Total Area Debrided (L x W): 1.6 (cm) x 1.5 (cm) = 2.4 (cm) Tissue and other material Viable, Non-Viable, Slough, Subcutaneous, Biofilm, Slough debrided: Level: Skin/Subcutaneous Tissue Debridement Description: Excisional Instrument: Curette Bleeding: Minimum Hemostasis Achieved: Pressure Procedural Pain: 0 Post Procedural Pain: 0 Response to Treatment: Procedure was  tolerated well Level of Consciousness (Post- Awake and Alert procedure): Post Debridement Measurements of Total Wound Length: (cm) 1.6 Width: (cm) 1.5 Depth: (cm) 0.3 Volume: (cm) 0.565 Character of Wound/Ulcer Post Debridement: Improved Severity of Tissue Post Debridement: Fat layer exposed Post Procedure Diagnosis Same as Pre-procedure Electronic Signature(s) Signed: 02/04/2022 4:01:56 PM By: Alycia Rossetti Signed: 02/04/2022 4:59:17 PM By: Worthy Keeler PA-C Entered By: Alycia Rossetti on 02/04/2022 11:34:09 Gene Brown ([id number]) -------------------------------------------------------------------------------- HPI Details Patient Name: Gene Brown Date of Service: 02/04/2022 10:45 AM Medical Record Number: [id number] Patient Account Number: [id number] Date of Birth/Sex: [birth year] (71 y.o. M) Treating RN: Alycia Rossetti Primary Care Provider: Tracie Harrier Other Clinician: Referring Provider: Tracie Harrier Treating Provider/Extender: Skipper Cliche in Treatment: 4 History of Present Illness HPI Description: 05/24/16; this is a 71 year old type II diabetic who probably has some degree of polyneuropathy. He tells me that he has had an area on the plantar aspect of his left great toe that is been followed by podiatry for most the last year. He has a lot of callus, the podiatrist which shave this down put a dressing on and he would follow pad monthly or sometimes longer intervals. He has not noted any drainage or pain. He has not had a recent x-ray. No serious attempt at offloading this with modified footwear.. Last hemoglobin A1c that he knows about was over 10 a month ago he is working hard on this with his primary physician. More recently he has been soaking this with peroxide ABIs are noncompressible and the left leg. He was told by his podiatrist he has a bone spur underneath the wound but did not wish any surgical procedures. He has a caregiver at home for a  very disabled wife, the nature of her illness is not certain 92/92/44 patient comes in much the same as last week amounts of callus nonviable subcutaneous tissue which required debridement. His x-ray suggested cortical bone irregularity. He will need an MRI. He tells me he has old screws/hardware left knee however he thinks he  has had a subsequent MRI after this 06/07/16 at this point in time patient has been attempting to offload his wound as much as he could on his own. He has made a conscious effort to avoid placing any pressure on the toe as he was moving around and walking and I do believe this has payed off and what I am seeing at this point in time. overall the wound appears to be somewhat improved in my opinion he is not having any significant discomfort at this point in time. In fact he tells me he has no pain at all. There is no interval sign of surrounding infection in the wound itself has actually decreased in size. Unfortunately the MRI was not ordered last week and therefore we do need to proceed with the MRI order today. again this is to evaluate the cortical bone irregularity noted on x-ray. 06/14/16 Patient has continued to use the offloading shoe at this point in time. He tells me this is somewhat difficult to get used to walking and but nonetheless he is very strict about wearing it. Again I believe this has paid off very well for him and his wound appears to be greatly improved. He does have some callused area surrounding the wound proximally that has caused some fluid collection of maceration of the callused area. This likely will require debridement today. Some adherent slough noted over the wound bed. 06/21/16; patient's wound on the plantar aspect of his left great toe is totally epithelialized. His MRI of the foot is tomorrow to follow-up on the cortical irregularity noted on plain x-ray. He does not have diabetic foot where 06-28-16-He comes in today with a continued healed  left great toe. MRI performed on 06/23/2016 shows no abscess osteomyelitis or septic joint. Patient was discharged from wound care center today. Readmission: 01-07-2022 upon evaluation today patient appears to be doing somewhat poorly in regard to total ulcer on his left foot. Fortunately there does not appear to be any signs of infection which is great news. Has been seen by Dr. Luana Shu and he did request from Dr. Luana Shu to come see [place] due to the fact that in the past several years back we were able to get one of his wounds healed about 4 weeks. Again I explained that that is always variable but nonetheless I definitely think we can help. Fortunately I see no evidence of infection. He does have a foot offloading shoe which I think is good to attempt for now he was able to get this healed before with a peg assist. Patient does have a history of type 2 diabetes, diabetic polyneuropathy, and hypertension. This current wound started on 12-09-2021. 01-20-2022 upon evaluation today patient appears to be doing poorly in regard to his great toe. I am actually very concerned based on what I am seeing here at this point about how things are progressing. I do not believe there is any evidence of infection systemically that is obvious but locally I am concerned about the possibility this could even be an osteomyelitis type situation. 01-28-2022 upon evaluation today patient appears to be doing well with regard to his toe ulcer compared to what we were previous. Fortunately I do not see any signs of active infection at this time which is great news. No fevers, chills, nausea, vomiting, or diarrhea. With that being said the patient has not started his Augmentin yet which I definitely think he needs to get on board ASAP. I do see some signs of  new skin growth I think he is using the front offloading shoe which is good for the time being. 02-04-2022 upon evaluation today patient appears to be doing okay in regard to his toe  ulcer this is actually measuring a little bit smaller but still has a bit of callus buildup but not as much as we have seen in the past. Fortunately I do not see any evidence of active infection locally or systemically which is great news. No fevers, chills, nausea, vomiting, or diarrhea. Electronic Signature(s) Signed: 02/04/2022 3:06:32 PM By: Worthy Keeler PA-C Entered By: Worthy Keeler on 02/04/2022 15:06:32 Gene Brown ([id number]) -------------------------------------------------------------------------------- Physical Exam Details Patient Name: Gene Brown Date of Service: 02/04/2022 10:45 AM Medical Record Number: [id number] Patient Account Number: [id number] Date of Birth/Sex: 04/15/1951 (71 y.o. M) Treating RN: Alycia Rossetti Primary Care Provider: Tracie Harrier Other Clinician: Referring Provider: Tracie Harrier Treating Provider/Extender: Skipper Cliche in Treatment: 4 Constitutional Well-nourished and well-hydrated in no acute distress. Respiratory normal breathing without difficulty. Psychiatric this patient is able to make decisions and demonstrates good insight into disease process. Alert and Oriented x 3. pleasant and cooperative. Notes Upon inspection patient's wound bed showed signs of good granulation and epithelization at this point. Fortunately I do not see any signs of active infection at this time which is great news and overall very pleased. Postdebridement the wound appears to be doing much better. Electronic Signature(s) Signed: 02/04/2022 3:06:51 PM By: Worthy Keeler PA-C Entered By: Worthy Keeler on 02/04/2022 15:06:51 Gene Brown ([id number]) -------------------------------------------------------------------------------- Physician Orders Details Patient Name: Gene Brown Date of Service: 02/04/2022 10:45 AM Medical Record Number: [id number] Patient Account Number: [id number] Date of Birth/Sex: [birth year] (71 y.o.  M) Treating RN: Alycia Rossetti Primary Care Provider: Tracie Harrier Other Clinician: Referring Provider: Tracie Harrier Treating Provider/Extender: Skipper Cliche in Treatment: 4 Verbal / Phone Orders: No Diagnosis Coding ICD-10 Coding Code Description E11.621 Type 2 diabetes mellitus with foot ulcer 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osed E11.42 Type 2 diabetes mellitus with diabetic polyneuropathy I10 Essential (primary) hypertension Follow-up Appointments Wound #2 Left,Plantar Toe Great o Return Appointment in 1 week. o Nurse Visit as needed Bathing/ Shower/ Hygiene o May shower; gently cleanse wound with antibacterial soap, rinse and pat dry prior to dressing wounds o No tub bath. Anesthetic (Use 'Patient Medications' Section for Anesthetic Order Entry) o Lidocaine applied to wound bed Edema Control - Lymphedema / Segmental Compressive Device / Other o Compression Pump: Use compression pump on left lower extremity for 60 minutes, twice daily. o Compression Pump: Use compression pump on right lower extremity for 60 minutes, twice daily. o DO YOUR BEST to sleep in the bed at night. DO NOT sleep in your recliner. Long hours of sitting in a recliner leads to swelling of the legs and/or potential wounds on your backside. Off-Loading o Offloading felt to foot. - donut cut in offloading felt o Other: - front off-loader left Additional Orders / Instructions o Follow Nutritious Diet and Increase Protein Intake o Activity as tolerated Wound Treatment Wound #2 - Toe Great Wound Laterality: Plantar, Left Cleanser: Byram Ancillary Kit - 15 Day Supply (Generic) 3 x Per Week/30 Days Discharge Instructions: Use supplies as instructed; Kit contains: (15) Saline Bullets; (15) 3x3 Gauze; 15 pr Gloves Cleanser: Soap and Water 3 x Per Week/30 Days Discharge Instructions: Gently cleanse wound with antibacterial soap,  rinse and pat dry prior to  dressing wounds Primary Dressing: Silvercel Small 2x2 (in/in) (Generic) 3 x Per Week/30 Days Discharge Instructions: Apply Silvercel Small 2x2 (in/in) as instructed Secondary Dressing: Conforming Guaze Roll-Small (Generic) 3 x Per Week/30 Days Discharge Instructions: Minor as directed Secured With: [place] Soft Cloth Surgical Tape, 2x2 (in/yd) (Generic) 3 x Per Week/30 Days Electronic Signature(s) Signed: 02/04/2022 4:01:56 PM By: Maren Beach ([id number]) Signed: 02/04/2022 4:59:17 PM By: Worthy Keeler PA-C Entered By: Alycia Rossetti on 02/04/2022 11:34:46 Gene Brown ([id number]) -------------------------------------------------------------------------------- Problem List Details Patient Name: Gene Brown Date of Service: 02/04/2022 10:45 AM Medical Record Number: [id number] Patient Account Number: [id number] Date of Birth/Sex: 1951-07-11 (71 y.o. M) Treating RN: Alycia Rossetti Primary Care Provider: Tracie Harrier Other Clinician: Referring Provider: Tracie Harrier Treating Provider/Extender: Skipper Cliche in Treatment: 4 Active Problems ICD-10 Encounter Code Description Active Date MDM Diagnosis E11.621 Type 2 diabetes mellitus with foot ulcer 01/07/2022 No Yes L97.522 Non-pressure chronic ulcer of other part of left foot with fat layer 01/07/2022 No Yes exposed E11.42 Type 2 diabetes mellitus with diabetic polyneuropathy 01/07/2022 No Yes I10 Essential (primary) hypertension 01/07/2022 No Yes Inactive Problems Resolved Problems Electronic Signature(s) Signed: 02/04/2022 4:01:56 PM By: Alycia Rossetti Signed: 02/04/2022 4:59:17 PM By: Worthy Keeler PA-C Previous Signature: 02/04/2022 10:45:24 AM Version By: Worthy Keeler PA-C Entered By: Alycia Rossetti on 02/04/2022 11:36:22 Gene Brown  ([id number]) -------------------------------------------------------------------------------- Progress Note Details Patient Name: Gene Brown Date of Service: 02/04/2022 10:45 AM Medical Record Number: [id number] Patient Account Number: [id number] Date of Birth/Sex: 02-Nov-1950 (71 y.o. M) Treating RN: Alycia Rossetti Primary Care Provider: Tracie Harrier Other Clinician: Referring Provider: Tracie Harrier Treating Provider/Extender: Skipper Cliche in Treatment: 4 Subjective Chief Complaint Information obtained from Patient Left great toe ulcer History of Present Illness (HPI) 05/24/16; this is a 71 year old type II diabetic who probably has some degree of polyneuropathy. He tells me that he has had an area on the plantar aspect of his left great toe that is been followed by podiatry for most the last year. He has a lot of callus, the podiatrist which shave this down put a dressing on and he would follow pad monthly or sometimes longer intervals. He has not noted any drainage or pain. He has not had a recent x-ray. No serious attempt at offloading this with modified footwear.. Last hemoglobin A1c that he knows about was over 10 a month ago he is working hard on this with his primary physician. More recently he has been soaking this with peroxide ABIs are noncompressible and the left leg. He was told by his podiatrist he has a bone spur underneath the wound but did not wish any surgical procedures. He has a caregiver at home for a very disabled wife, the nature of her illness is not certain 36/12/24 patient comes in much the same as last week amounts of callus nonviable subcutaneous tissue which required debridement. His x-ray suggested cortical bone irregularity. He will need an MRI. He tells me he has old screws/hardware left knee however he thinks he has had a subsequent MRI after this 06/07/16 at this point in time patient has been attempting to offload his wound as much as he  could on his own. He has made a conscious effort to avoid placing any pressure on the toe as he was moving around and walking and I do believe this has payed off and what I am seeing at  this point in time. overall the wound appears to be somewhat improved in my opinion he is not having any significant discomfort at this point in time. In fact he tells me he has no pain at all. There is no interval sign of surrounding infection in the wound itself has actually decreased in size. Unfortunately the MRI was not ordered last week and therefore we do need to proceed with the MRI order today. again this is to evaluate the cortical bone irregularity noted on x-ray. 06/14/16 Patient has continued to use the offloading shoe at this point in time. He tells me this is somewhat difficult to get used to walking and but nonetheless he is very strict about wearing it. Again I believe this has paid off very well for him and his wound appears to be greatly improved. He does have some callused area surrounding the wound proximally that has caused some fluid collection of maceration of the callused area. This likely will require debridement today. Some adherent slough noted over the wound bed. 06/21/16; patient's wound on the plantar aspect of his left great toe is totally epithelialized. His MRI of the foot is tomorrow to follow-up on the cortical irregularity noted on plain x-ray. He does not have diabetic foot where 06-28-16-He comes in today with a continued healed left great toe. MRI performed on 06/23/2016 shows no abscess osteomyelitis or septic joint. Patient was discharged from wound care center today. Readmission: 01-07-2022 upon evaluation today patient appears to be doing somewhat poorly in regard to total ulcer on his left foot. Fortunately there does not appear to be any signs of infection which is great news. Has been seen by Dr. Luana Shu and he did request from Dr. Luana Shu to come see [place] due to the fact that in  the past several years back we were able to get one of his wounds healed about 4 weeks. Again I explained that that is always variable but nonetheless I definitely think we can help. Fortunately I see no evidence of infection. He does have a foot offloading shoe which I think is good to attempt for now he was able to get this healed before with a peg assist. Patient does have a history of type 2 diabetes, diabetic polyneuropathy, and hypertension. This current wound started on 12-09-2021. 01-20-2022 upon evaluation today patient appears to be doing poorly in regard to his great toe. I am actually very concerned based on what I am seeing here at this point about how things are progressing. I do not believe there is any evidence of infection systemically that is obvious but locally I am concerned about the possibility this could even be an osteomyelitis type situation. 01-28-2022 upon evaluation today patient appears to be doing well with regard to his toe ulcer compared to what we were previous. Fortunately I do not see any signs of active infection at this time which is great news. No fevers, chills, nausea, vomiting, or diarrhea. With that being said the patient has not started his Augmentin yet which I definitely think he needs to get on board ASAP. I do see some signs of new skin growth I think he is using the front offloading shoe which is good for the time being. 02-04-2022 upon evaluation today patient appears to be doing okay in regard to his toe ulcer this is actually measuring a little bit smaller but still has a bit of callus buildup but not as much as we have seen in the past. Fortunately I  do not see any evidence of active infection locally or systemically which is great news. No fevers, chills, nausea, vomiting, or diarrhea. Objective Gene Brown, Gene Brown ([id number]) Constitutional Well-nourished and well-hydrated in no acute distress. Vitals Time Taken: 11:00 AM, Height: 68 in, Weight: 217  lbs, BMI: 33, Temperature: 98.2 F, Pulse: 62 bpm, Respiratory Rate: 18 breaths/min, Blood Pressure: 182/81 mmHg. Respiratory normal breathing without difficulty. Psychiatric this patient is able to make decisions and demonstrates good insight into disease process. Alert and Oriented x 3. pleasant and cooperative. General Notes: Upon inspection patient's wound bed showed signs of good granulation and epithelization at this point. Fortunately I do not see any signs of active infection at this time which is great news and overall very pleased. Postdebridement the wound appears to be doing much better. Integumentary (Hair, Skin) Wound #2 status is Open. Original cause of wound was Gradually Appeared. The date acquired was: 12/09/2021. The wound has been in treatment 4 weeks. The wound is located on the [employer]. The wound measures 1.6cm length x 1.5cm width x 0.3cm depth; 1.885cm^2 area and 0.565cm^3 volume. There is Fat Layer (Subcutaneous Tissue) exposed. There is a medium amount of serous drainage noted. The wound margin is flat and intact. There is large (67-100%) pink granulation within the wound bed. There is a small (1-33%) amount of necrotic tissue within the wound bed including Adherent Slough. Assessment Active Problems ICD-10 Type 2 diabetes mellitus with foot ulcer Non-pressure chronic ulcer of other part of left foot with fat layer exposed Type 2 diabetes mellitus with diabetic polyneuropathy Essential (primary) hypertension Procedures Wound #2 Pre-procedure diagnosis of Wound #2 is a Diabetic Wound/Ulcer of the Lower Extremity located on the Left,Plantar Toe Great .Severity of Tissue Pre Debridement is: Fat layer exposed. There was a Excisional Skin/Subcutaneous Tissue Debridement with a total area of 2.4 sq cm performed by Tommie Sams., PA-C. With the following instrument(s): Curette to remove Viable and Non-Viable tissue/material. Material removed  includes Subcutaneous Tissue, Slough, and Biofilm after achieving pain control using Lidocaine 4% Topical Solution. No specimens were taken. A time out was conducted at 11:30, prior to the start of the procedure. A Minimum amount of bleeding was controlled with Pressure. The procedure was tolerated well with a pain level of 0 throughout and a pain level of 0 following the procedure. Post Debridement Measurements: 1.6cm length x 1.5cm width x 0.3cm depth; 0.565cm^3 volume. Character of Wound/Ulcer Post Debridement is improved. Severity of Tissue Post Debridement is: Fat layer exposed. Post procedure Diagnosis Wound #2: Same as Pre-Procedure Plan Follow-up Appointments: Wound #2 Left,Plantar Toe Great: Return Appointment in 1 week. Nurse Visit as needed Bathing/ Shower/ Hygiene: May shower; gently cleanse wound with antibacterial soap, rinse and pat dry prior to dressing wounds No tub bath. Anesthetic (Use 'Patient Medications' Section for Anesthetic Order Entry): Lidocaine applied to wound bed Edema Control - Lymphedema / Segmental Compressive Device / Other: Compression Pump: Use compression pump on left lower extremity for 60 minutes, twice daily. Gene Brown, Gene Brown ([id number]) Compression Pump: Use compression pump on right lower extremity for 60 minutes, twice daily. DO YOUR BEST to sleep in the bed at night. DO NOT sleep in your recliner. Long hours of sitting in a recliner leads to swelling of the legs and/or potential wounds on your backside. Off-Loading: Offloading felt to foot. - donut cut in offloading felt Other: - front off-loader left Additional Orders / Instructions: Follow Nutritious Diet and Increase Protein Intake Activity as tolerated  WOUND #2: - Toe Great Wound Laterality: Plantar, Left Cleanser: [place] Ancillary Kit - 15 Day Supply (Generic) 3 x Per Week/30 Days Discharge Instructions: Use supplies as instructed; Kit contains: (15) Saline Bullets; (15) 3x3 Gauze; 15  pr Gloves Cleanser: Soap and Water 3 x Per Week/30 Days Discharge Instructions: Gently cleanse wound with antibacterial soap, rinse and pat dry prior to dressing wounds Primary Dressing: Silvercel Small 2x2 (in/in) (Generic) 3 x Per Week/30 Days Discharge Instructions: Apply Silvercel Small 2x2 (in/in) as instructed Secondary Dressing: Conforming Guaze Roll-Small (Generic) 3 x Per Week/30 Days Discharge Instructions: Apply Conforming Stretch Guaze Bandage as directed Secured With: Medipore Tape - 29M Medipore H Soft Cloth Surgical Tape, 2x2 (in/yd) (Generic) 3 x Per Week/30 Days 1. Would recommend currently that we go ahead and continue with the wound care measures as before and the patient is in agreement with that plan this includes the use of the silver alginate dressing which I think is doing a good job. 2. Also, continue with roll gauze to secure in place. 3. I would also suggest that we use the tape to help secure this as well so it does not slide off. We will see patient back for reevaluation in 1 week here in the clinic. If anything worsens or changes patient will contact our office for additional recommendations. Electronic Signature(s) Signed: 02/04/2022 3:07:21 PM By: Worthy Keeler PA-C Entered By: Worthy Keeler on 02/04/2022 15:07:20 Gene Brown ([id number]) -------------------------------------------------------------------------------- SuperBill Details Patient Name: Gene Brown Date of Service: 02/04/2022 Medical Record Number: [id number] Patient Account Number: [id number] Date of Birth/Sex: [birth year] (71 y.o. 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 I10 Essential (primary) hypertension Facility Procedures CPT4 Code: 40347425 Description: 95638 - DEB SUBQ TISSUE 20 SQ CM/< Modifier: Quantity: 1 CPT4 Code: Description: ICD-10 Diagnosis Description 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 Modifier: osed Quantity: Physician Procedures CPT4 Code: 7564332 Description: 11042 - WC PHYS SUBQ TISS 20 SQ CM Modifier: Quantity: 1 CPT4 Code: Description: ICD-10 Diagnosis Description 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 Modifier: osed Quantity: Electronic Signature(s) Signed: 02/04/2022 3:07:43 PM By: Worthy Keeler PA-C Entered By: Worthy Keeler on 02/04/2022 15:07:42

## 2022-02-14 ENCOUNTER — Encounter: Payer: Medicare Other | Attending: Physician Assistant | Admitting: Physician Assistant

## 2022-02-14 DIAGNOSIS — E11621 Type 2 diabetes mellitus with foot ulcer: Secondary | ICD-10-CM | POA: Diagnosis present

## 2022-02-14 DIAGNOSIS — E1142 Type 2 diabetes mellitus with diabetic polyneuropathy: Secondary | ICD-10-CM | POA: Insufficient documentation

## 2022-02-14 DIAGNOSIS — L97522 Non-pressure chronic ulcer of other part of left foot with fat layer exposed: Secondary | ICD-10-CM | POA: Insufficient documentation

## 2022-02-14 DIAGNOSIS — I10 Essential (primary) hypertension: Secondary | ICD-10-CM | POA: Insufficient documentation

## 2022-02-14 DIAGNOSIS — I890 Lymphedema, not elsewhere classified: Secondary | ICD-10-CM | POA: Diagnosis not present

## 2022-02-14 NOTE — Progress Notes (Signed)
D'ARCY, ABRAHA ([id number]) Visit Report for 02/14/2022 Chief Complaint Document Details Patient Name: Gene Brown, Gene Brown Date of Service: 02/14/2022 9:00 AM Medical Record Number: [id number] Patient Account Number: [id number] Date of Birth/Sex: [birth year] (71 y.o. M) Treating RN: Primary Care Provider: Barbette Reichmann Other Clinician: Referring Provider: Barbette Reichmann Treating Provider/Extender: Rowan Blase in Treatment: 5 Information Obtained from: Patient Chief Complaint Left great toe ulcer Electronic Signature(s) Signed: 02/14/2022 8:30:55 AM By: Lenda Kelp PA-C Entered By: Lenda Kelp on 02/14/2022 08:30:55 Gene Brown ([id number]) -------------------------------------------------------------------------------- Problem List Details Patient Name: Gene Brown Date of Service: 02/14/2022 9:00 AM Medical Record Number: [id number] Patient Account Number: [id number] Date of Birth/Sex: [birth year] (71 y.o. M) Treating RN: Primary Care Provider: Barbette Reichmann Other Clinician: Referring Provider: Barbette Reichmann Treating Provider/Extender: Rowan Blase in Treatment: 5 Active Problems ICD-10 Encounter Code Description Active Date MDM Diagnosis E11.621 Type 2 diabetes mellitus with foot ulcer 01/07/2022 No Yes L97.522 Non-pressure chronic ulcer of other part of left foot with fat layer 01/07/2022 No Yes exposed E11.42 Type 2 diabetes mellitus with diabetic polyneuropathy 01/07/2022 No Yes I10 Essential (primary) hypertension 01/07/2022 No Yes Inactive Problems Resolved Problems Electronic Signature(s) Signed: 02/14/2022 8:30:52 AM By: Lenda Kelp PA-C Entered By: Lenda Kelp on 02/14/2022 08:30:52

## 2022-02-14 NOTE — Progress Notes (Addendum)
DANNIEL, TONES ([id number]) Visit Report for 02/14/2022 Arrival Information Details Patient Name: Gene Brown, Gene Brown Date of Service: 02/14/2022 9:00 AM Medical Record Number: [id number] Patient Account Number: [id number] Date of Birth/Sex: 06-23-1951 (71 y.o. M) Treating RN: Huel Coventry Primary Care Becket Wecker: Barbette Reichmann Other Clinician: Referring Reeve Mallo: Barbette Reichmann Treating Levita Monical/Extender: Rowan Blase in Treatment: 5 Visit Information History Since Last Visit Added or deleted any medications: No Patient Arrived: [name] Arrival Time: 08:41 Has Footwear/Offloading in Place as Prescribed: Yes Accompanied By: self Left: Wedge Shoe Transfer Assistance: None Pain Present Now: No Patient Identification Verified: Yes Secondary Verification Process Completed: Yes Patient Has Alerts: Yes Patient Alerts: Patient on Blood Thinner 01/07/2022 Jerome>220 on left Electronic Signature(s) Signed: 02/15/2022 3:46:53 PM By: Elliot Gurney, BSN, RN, CWS, Kim RN, BSN Entered By: Elliot Gurney, BSN, RN, CWS, Kim on 02/14/2022 08:42:23 Gene Brown ([id number]) -------------------------------------------------------------------------------- Encounter Discharge Information Details Patient Name: Gene Brown Date of Service: 02/14/2022 9:00 AM Medical Record Number: [id number] Patient Account Number: [id number] Date of Birth/Sex: 05/20/1951 (71 y.o. M) Treating RN: Huel Coventry Primary Care Elaura Calix: Barbette Reichmann Other Clinician: Referring Adalis Gatti: Barbette Reichmann Treating Shelbey Spindler/Extender: Rowan Blase in Treatment: 5 Encounter Discharge Information Items Post Procedure Vitals Discharge Condition: Stable Temperature (F): 97.9 Ambulatory Status: Cane Pulse (bpm): 83 Discharge Destination: Home Respiratory Rate (breaths/min): 18 Transportation: Private Auto Blood Pressure (mmHg): 175/89 Accompanied By: self Schedule Follow-up Appointment:  Yes Clinical Summary of Care: Electronic Signature(s) Signed: 02/15/2022 3:46:53 PM By: Elliot Gurney, BSN, RN, CWS, Kim RN, BSN Entered By: Elliot Gurney, BSN, RN, CWS, Kim on 02/14/2022 09:42:13 Gene Brown ([id number]) -------------------------------------------------------------------------------- Lower Extremity Assessment Details Patient Name: Gene Brown Date of Service: 02/14/2022 9:00 AM Medical Record Number: [id number] Patient Account Number: [id number] Date of Birth/Sex: 13-Jun-1951 (71 y.o. M) Treating RN: Huel Coventry Primary Care Destanie Tibbetts: Barbette Reichmann Other Clinician: Referring Manoj Enriquez: Barbette Reichmann Treating Latrisa Hellums/Extender: Rowan Blase in Treatment: 5 Vascular Assessment Pulses: Dorsalis Pedis Palpable: [Left:Yes] Electronic Signature(s) Signed: 02/15/2022 3:46:53 PM By: Elliot Gurney, BSN, RN, CWS, Kim RN, BSN Entered By: Elliot Gurney, BSN, RN, CWS, Kim on 02/14/2022 09:12:16 Gene Brown ([id number]) -------------------------------------------------------------------------------- Multi Wound Chart Details Patient Name: Gene Brown Date of Service: 02/14/2022 9:00 AM Medical Record Number: [id number] Patient Account Number: [id number] Date of Birth/Sex: 08-Jun-1951 (71 y.o. M) Treating RN: Huel Coventry Primary Care Maan Zarcone: Barbette Reichmann Other Clinician: Referring Issacc Merlo: Barbette Reichmann Treating Amariana Mirando/Extender: Rowan Blase in Treatment: 5 Vital Signs Height(in): 68 Pulse(bpm): 83 Weight(lbs): 217 Blood Pressure(mmHg): 175/88 Body Mass Index(BMI): 33 Temperature(F): 97.9 Respiratory Rate(breaths/min): 18 Photos: [N/A:N/A] Wound Location: Left, Plantar Toe Great N/A N/A Wounding Event: Gradually Appeared N/A N/A Primary Etiology: Diabetic Wound/Ulcer of the Lower N/A N/A Extremity Comorbid History: Cataracts, Sickle Cell Disease, Sleep N/A N/A Apnea, Hypertension, Type II Diabetes, Gout, Osteoarthritis Date Acquired: 12/09/2021 N/A  N/A Weeks of Treatment: 5 N/A N/A Wound Status: Open N/A N/A Wound Recurrence: No N/A N/A Measurements L x W x D (cm) 1.5x1.2x0.3 N/A N/A Area (cm) : 1.414 N/A N/A Volume (cm) : 0.424 N/A N/A % Reduction in Area: -399.60% N/A N/A % Reduction in Volume: -200.70% N/A N/A Classification: Grade 2 N/A N/A Exudate Amount: Medium N/A N/A Exudate Type: Serous N/A N/A Exudate Color: amber N/A N/A Wound Margin: Flat and Intact N/A N/A Granulation Amount: Large (67-100%) N/A N/A Granulation Quality: Pale, Hyper-granulation N/A N/A Necrotic Amount: Small (1-33%) N/A N/A Exposed Structures: Fat Layer (Subcutaneous Tissue): N/A N/A Yes  Fascia: No Tendon: No Muscle: No Joint: No Bone: No Epithelialization: None N/A N/A Treatment Notes Electronic Signature(s) Signed: 02/15/2022 3:46:53 PM By: Elliot Gurney, BSN, RN, CWS, Kim RN, BSN Entered By: Elliot Gurney, BSN, RN, CWS, Kim on 02/14/2022 09:28:11 Gene Brown ([id number], Gene Brown ([id number]) -------------------------------------------------------------------------------- Multi-Disciplinary Care Plan Details Patient Name: Gene Brown Date of Service: 02/14/2022 9:00 AM Medical Record Number: [id number] Patient Account Number: [id number] Date of Birth/Sex: [birth year] (71 y.o. M) Treating RN: Huel Coventry Primary Care Ariba Lehnen: Barbette Reichmann Other Clinician: Referring Byron Peacock: Barbette Reichmann Treating Kirtan Sada/Extender: Rowan Blase in Treatment: 5 Active Inactive Necrotic Tissue Nursing Diagnoses: Impaired tissue integrity related to necrotic/devitalized tissue Knowledge deficit related to management of necrotic/devitalized tissue Goals: Necrotic/devitalized tissue will be minimized in the wound bed Date Initiated: 01/07/2022 Target Resolution Date: 01/07/2022 Goal Status: Active Patient/caregiver will verbalize understanding of reason and process for debridement of necrotic tissue Date Initiated: 01/07/2022 Target  Resolution Date: 01/07/2022 Goal Status: Active Interventions: Assess patient pain level pre-, during and post procedure and prior to discharge Provide education on necrotic tissue and debridement process Treatment Activities: Apply topical anesthetic as ordered : 01/07/2022 Excisional debridement : 01/07/2022 Notes: Orientation to the Wound Care Program Nursing Diagnoses: Knowledge deficit related to the wound healing center program Goals: Patient/caregiver will verbalize understanding of the Wound Healing Center Program Date Initiated: 01/07/2022 Target Resolution Date: 01/07/2022 Goal Status: Active Interventions: Provide education on orientation to the wound center Notes: Peripheral Neuropathy Nursing Diagnoses: Knowledge deficit related to disease process and management of peripheral neurovascular dysfunction Potential alteration in peripheral tissue perfusion (select prior to confirmation of diagnosis) Goals: Patient/caregiver will verbalize understanding of disease process and disease management Date Initiated: 01/07/2022 Target Resolution Date: 01/07/2022 Goal Status: Active Interventions: Assess signs and symptoms of neuropathy upon admission and as needed Provide education on Management of Neuropathy and Related Ulcers Provide education on Management of Neuropathy upon discharge from the Wound Center Gene Brown, Gene Brown ([id number]) Notes: Wound/Skin Impairment Nursing Diagnoses: Impaired tissue integrity Knowledge deficit related to smoking impact on wound healing Goals: Patient/caregiver will verbalize understanding of skin care regimen Date Initiated: 01/07/2022 Target Resolution Date: 01/07/2022 Goal Status: Active Ulcer/skin breakdown will have a volume reduction of 30% by week 4 Date Initiated: 01/07/2022 Target Resolution Date: 02/04/2022 Goal Status: Active Ulcer/skin breakdown will have a volume reduction of 50% by week 8 Date Initiated: 01/07/2022 Target Resolution Date:  03/03/2022 Goal Status: Active Interventions: Assess patient/caregiver ability to obtain necessary supplies Assess patient/caregiver ability to perform ulcer/skin care regimen upon admission and as needed Assess ulceration(s) every visit Treatment Activities: Referred to DME Vienne Corcoran for dressing supplies : 01/07/2022 Skin care regimen initiated : 01/07/2022 Topical wound management initiated : 01/07/2022 Notes: Electronic Signature(s) Signed: 02/15/2022 3:46:53 PM By: Elliot Gurney, BSN, RN, CWS, Kim RN, BSN Entered By: Elliot Gurney, BSN, RN, CWS, Kim on 02/14/2022 09:28:06 Gene Brown ([id number]) -------------------------------------------------------------------------------- Pain Assessment Details Patient Name: Gene Brown Date of Service: 02/14/2022 9:00 AM Medical Record Number: [id number] Patient Account Number: [id number] Date of Birth/Sex: [birth year] (71 y.o. M) Treating RN: Huel Coventry Primary Care Carlos Heber: Barbette Reichmann Other Clinician: Referring Encarnacion Bole: Barbette Reichmann Treating Analysia Dungee/Extender: Rowan Blase in Treatment: 5 Active Problems Location of Pain Severity and Description of Pain Patient Has Paino No Site Locations Pain Management and Medication Current Pain Management: Electronic Signature(s) Signed: 02/15/2022 3:46:53 PM By: Elliot Gurney, BSN, RN, CWS, Kim RN, BSN Entered By: Elliot Gurney, BSN, RN, CWS, Kim on 02/14/2022 08:44:19 Gene Brown ([id number]) --------------------------------------------------------------------------------  Patient/Caregiver Education Details Patient Name: Gene Brown, Gene Brown Date of Service: 02/14/2022 9:00 AM Medical Record Number: [id number] Patient Account Number: [id number] Date of Birth/Gender: 06-14-1951 (71 y.o. M) Treating RN: Huel Coventry Primary Care Physician: Barbette Reichmann Other Clinician: Referring Physician: Barbette Reichmann Treating Physician/Extender: Rowan Blase in Treatment: 5 Education Assessment Education  Provided To: Patient Education Topics Provided Wound Debridement: Handouts: Wound Debridement Methods: Demonstration, Explain/Verbal Responses: State content correctly Electronic Signature(s) Signed: 02/15/2022 3:46:53 PM By: Elliot Gurney, BSN, RN, CWS, Kim RN, BSN Entered By: Elliot Gurney, BSN, RN, CWS, Kim on 02/14/2022 09:41:16 Gene Brown ([id number]) -------------------------------------------------------------------------------- Wound Assessment Details Patient Name: Gene Brown Date of Service: 02/14/2022 9:00 AM Medical Record Number: [id number] Patient Account Number: [id number] Date of Birth/Sex: [birth year] (71 y.o. M) Treating RN: Huel Coventry Primary Care Jame Morrell: Barbette Reichmann Other Clinician: Referring Anelly Samarin: Barbette Reichmann Treating D[place] Lozito/Extender: Rowan Blase in Treatment: 5 Wound Status Wound Number: 2 Primary Diabetic Wound/Ulcer of the Lower Extremity Etiology: Wound Location: Left, Plantar Toe Great Wound Open Wounding Event: Gradually Appeared Status: Date Acquired: 12/09/2021 Comorbid Cataracts, Sickle Cell Disease, Sleep Apnea, Weeks Of Treatment: 5 History: Hypertension, Type II Diabetes, Gout, Osteoarthritis Clustered Wound: No Photos Wound Measurements Length: (cm) 1.5 % Red Width: (cm) 1.2 % Red Depth: (cm) 0.3 Epith Area: (cm) 1.414 Tunn Volume: (cm) 0.424 Unde uction in Area: -399.6% uction in Volume: -200.7% elialization: None eling: No rmining: No Wound Description Classification: Grade 2 Foul Wound Margin: Flat and Intact Slou Exudate Amount: Medium Exudate Type: Serous Exudate Color: amber Odor After Cleansing: No gh/Fibrino Yes Wound Bed Granulation Amount: Large (67-100%) Exposed Structure Granulation Quality: Pale, Hyper-granulation Fascia Exposed: No Necrotic Amount: Small (1-33%) Fat Layer (Subcutaneous Tissue) Exposed: Yes Necrotic Quality: Adherent Slough Tendon Exposed: No Muscle Exposed: No Joint  Exposed: No Bone Exposed: No Treatment Notes Wound #2 (Toe Great) Wound Laterality: Plantar, Left Cleanser Peri-Wound Care Topical Gene Brown, Gene Brown ([id number]) Primary Dressing Silvercel Small 2x2 (in/in) Discharge Instruction: Apply Silvercel Small 2x2 (in/in) as instructed Secondary Dressing Conforming Guaze Roll-Small Discharge Instruction: Apply Conforming Stretch Guaze Bandage as directed Secured With Medipore Tape - 47M Medipore H Soft Cloth Surgical Tape, 2x2 (in/yd) Compression Wrap Compression Stockings Facilities manager) Signed: 02/15/2022 3:46:53 PM By: Elliot Gurney, BSN, RN, CWS, Kim RN, BSN Entered By: Elliot Gurney, BSN, RN, CWS, Kim on 02/14/2022 09:27:58 Gene Brown ([id number]) -------------------------------------------------------------------------------- Vitals Details Patient Name: Gene Brown Date of Service: 02/14/2022 9:00 AM Medical Record Number: [id number] Patient Account Number: [id number] Date of Birth/Sex: [birth year] (71 y.o. M) Treating RN: Huel Coventry Primary Care Anhthu Perdew: Barbette Reichmann Other Clinician: Referring Georgeana Oertel: Barbette Reichmann Treating Felisa Zechman/Extender: Rowan Blase in Treatment: 5 Vital Signs Time Taken: 08:43 Temperature (F): 97.9 Height (in): 68 Pulse (bpm): 83 Weight (lbs): 217 Respiratory Rate (breaths/min): 18 Body Mass Index (BMI): 33 Blood Pressure (mmHg): 175/88 Reference Range: 80 - 120 mg / dl Electronic Signature(s) Signed: 02/15/2022 3:46:53 PM By: Elliot Gurney, BSN, RN, CWS, Kim RN, BSN Entered By: Elliot Gurney, BSN, RN, CWS, Kim on 02/14/2022 08:44:11

## 2022-02-21 ENCOUNTER — Encounter: Payer: Medicare Other | Admitting: Physician Assistant

## 2022-02-21 DIAGNOSIS — E11621 Type 2 diabetes mellitus with foot ulcer: Secondary | ICD-10-CM | POA: Diagnosis not present

## 2022-02-22 NOTE — Progress Notes (Signed)
GARNET, OVERFIELD ([id number]) Visit Report for 02/21/2022 Chief Complaint Document Details Patient Name: Gene Brown, Gene Brown Date of Service: 02/21/2022 8:30 AM Medical Record Number: [id number] Patient Account Number: [id number] Date of Birth/Sex: Dec 06, 1950 (71 y.o. M) Treating RN: Angelina Pih Primary Care Provider: Barbette Reichmann Other Clinician: Referring Provider: Barbette Reichmann Treating Provider/Extender: Rowan Blase in Treatment: 6 Information Obtained from: Patient Chief Complaint Left great toe ulcer Electronic Signature(s) Signed: 02/22/2022 5:53:34 PM By: Lenda Kelp PA-C Entered By: Lenda Kelp on 02/21/2022 08:25:30 Gene Brown ([id number]) -------------------------------------------------------------------------------- Debridement Details Patient Name: Gene Brown Date of Service: 02/21/2022 8:30 AM Medical Record Number: [id number] Patient Account Number: [id number] Date of Birth/Sex: 1951/02/21 (71 y.o. M) Treating RN: Angelina Pih Primary Care Provider: Barbette Reichmann Other Clinician: Referring Provider: Barbette Reichmann Treating Provider/Extender: Rowan Blase in Treatment: 6 Debridement Performed for Wound #2 Left,Plantar Toe Great Assessment: Performed By: Physician Nelida Meuse., PA-C Debridement Type: Debridement Severity of Tissue Pre Debridement: Fat layer exposed Level of Consciousness (Pre- Awake and Alert procedure): Pre-procedure Verification/Time Out Yes - 08:58 Taken: Total Area Debrided (L x W): 1.2 (cm) x 1.2 (cm) = 1.44 (cm) Tissue and other material Viable, Non-Viable, Callus, Slough, Subcutaneous, Slough debrided: Level: Skin/Subcutaneous Tissue Debridement Description: Excisional Instrument: Curette Bleeding: Minimum Hemostasis Achieved: Pressure Response to Treatment: Procedure was tolerated well Level of Consciousness (Post- Awake and Alert procedure): Post Debridement Measurements of  Total Wound Length: (cm) 1.2 Width: (cm) 1.2 Depth: (cm) 0.3 Volume: (cm) 0.339 Character of Wound/Ulcer Post Debridement: Stable Severity of Tissue Post Debridement: Fat layer exposed Post Procedure Diagnosis Same as Pre-procedure Electronic Signature(s) Signed: 02/21/2022 4:46:59 PM By: Angelina Pih Signed: 02/22/2022 5:53:34 PM By: Lenda Kelp PA-C Entered By: Angelina Pih on 02/21/2022 09:00:20 Gene Brown ([id number]) -------------------------------------------------------------------------------- HPI Details Patient Name: Gene Brown Date of Service: 02/21/2022 8:30 AM Medical Record Number: [id number] Patient Account Number: [id number] Date of Birth/Sex: [birth year] (71 y.o. M) Treating RN: Angelina Pih Primary Care Provider: Barbette Reichmann Other Clinician: Referring Provider: Barbette Reichmann Treating Provider/Extender: Rowan Blase in Treatment: 6 History of Present Illness HPI Description: 05/24/16; this is a 71 year old type II diabetic who probably has some degree of polyneuropathy. He tells me that he has had an area on the plantar aspect of his left great toe that is been followed by podiatry for most the last year. He has a lot of callus, the podiatrist which shave this down put a dressing on and he would follow pad monthly or sometimes longer intervals. He has not noted any drainage or pain. He has not had a recent x-ray. No serious attempt at offloading this with modified footwear.. Last hemoglobin A1c that he knows about was over 10 a month ago he is working hard on this with his primary physician. More recently he has been soaking this with peroxide ABIs are noncompressible and the left leg. He was told by his podiatrist he has a bone spur underneath the wound but did not wish any surgical procedures. He has a caregiver at home for a very disabled wife, the nature of her illness is not certain 05/31/16 patient comes in much the same as  last week amounts of callus nonviable subcutaneous tissue which required debridement. His x-ray suggested cortical bone irregularity. He will need an MRI. He tells me he has old screws/hardware left knee however he thinks he has had a subsequent MRI after this 06/07/16 at this point in time patient has been  attempting to offload his wound as much as he could on his own. He has made a conscious effort to avoid placing any pressure on the toe as he was moving around and walking and I do believe this has payed off and what I am seeing at this point in time. overall the wound appears to be somewhat improved in my opinion he is not having any significant discomfort at this point in time. In fact he tells me he has no pain at all. There is no interval sign of surrounding infection in the wound itself has actually decreased in size. Unfortunately the MRI was not ordered last week and therefore we do need to proceed with the MRI order today. again this is to evaluate the cortical bone irregularity noted on x-ray. 06/14/16 Patient has continued to use the offloading shoe at this point in time. He tells me this is somewhat difficult to get used to walking and but nonetheless he is very strict about wearing it. Again I believe this has paid off very well for him and his wound appears to be greatly improved. He does have some callused area surrounding the wound proximally that has caused some fluid collection of maceration of the callused area. This likely will require debridement today. Some adherent slough noted over the wound bed. 06/21/16; patient's wound on the plantar aspect of his left great toe is totally epithelialized. His MRI of the foot is tomorrow to follow-up on the cortical irregularity noted on plain x-ray. He does not have diabetic foot where 06-28-16-He comes in today with a continued healed left great toe. MRI performed on 06/23/2016 shows no abscess osteomyelitis or septic joint. Patient was  discharged from wound care center today. Readmission: 01-07-2022 upon evaluation today patient appears to be doing somewhat poorly in regard to total ulcer on his left foot. Fortunately there does not appear to be any signs of infection which is great news. Has been seen by Dr. Excell Seltzer and he did request from Dr. Excell Seltzer to come see [place] due to the fact that in the past several years back we were able to get one of his wounds healed about 4 weeks. Again I explained that that is always variable but nonetheless I definitely think we can help. Fortunately I see no evidence of infection. He does have a foot offloading shoe which I think is good to attempt for now he was able to get this healed before with a peg assist. Patient does have a history of type 2 diabetes, diabetic polyneuropathy, and hypertension. This current wound started on 12-09-2021. 01-20-2022 upon evaluation today patient appears to be doing poorly in regard to his great toe. I am actually very concerned based on what I am seeing here at this point about how things are progressing. I do not believe there is any evidence of infection systemically that is obvious but locally I am concerned about the possibility this could even be an osteomyelitis type situation. 01-28-2022 upon evaluation today patient appears to be doing well with regard to his toe ulcer compared to what we were previous. Fortunately I do not see any signs of active infection at this time which is great news. No fevers, chills, nausea, vomiting, or diarrhea. With that being said the patient has not started his Augmentin yet which I definitely think he needs to get on board ASAP. I do see some signs of new skin growth I think he is using the front offloading shoe which is good for  the time being. 02-04-2022 upon evaluation today patient appears to be doing okay in regard to his toe ulcer this is actually measuring a little bit smaller but still has a bit of callus buildup but not as  much as we have seen in the past. Fortunately I do not see any evidence of active infection locally or systemically which is great news. No fevers, chills, nausea, vomiting, or diarrhea. Marland Kitchen 02-14-2022 upon evaluation today patient's wound is actually showing signs of good improvement which is great news. Fortunately I do not see any evidence of active infection locally or systemically which is great news as well. No fevers, chills, nausea, vomiting, or diarrhea. 02-21-2022 upon evaluation today patient's toe ulcer actually is showing signs of excellent improvement. I do not see any evidence of infection locally or systemically which is great news and overall I am extremely pleased with where we stand today. I do not see any signs of active infection locally or systemically which is great news. No fevers, chills, nausea, vomiting, or diarrhea. Electronic Signature(s) Signed: 02/21/2022 9:21:08 AM By: Lenda Kelp PA-C Entered By: Lenda Kelp on 02/21/2022 09:21:08 Gene Brown ([id number]) -------------------------------------------------------------------------------- Physical Exam Details Patient Name: Gene Brown Date of Service: 02/21/2022 8:30 AM Medical Record Number: [id number] Patient Account Number: [id number] Date of Birth/Sex: 1950/11/28 (71 y.o. M) Treating RN: Angelina Pih Primary Care Provider: Barbette Reichmann Other Clinician: Referring Provider: Barbette Reichmann Treating Provider/Extender: Rowan Blase in Treatment: 6 Constitutional Well-nourished and well-hydrated in no acute distress. Respiratory normal breathing without difficulty. Psychiatric this patient is able to make decisions and demonstrates good insight into disease process. Alert and Oriented x 3. pleasant and cooperative. Notes Upon inspection patient's wound bed actually showed signs of good granulation and epithelization at this point. Fortunately I do not see any evidence of infection  locally or systemically which is great news and overall I am extremely pleased with where we stand currently. Electronic Signature(s) Signed: 02/21/2022 9:21:22 AM By: Lenda Kelp PA-C Entered By: Lenda Kelp on 02/21/2022 09:21:22 Gene Brown ([id number]) -------------------------------------------------------------------------------- Physician Orders Details Patient Name: Gene Brown Date of Service: 02/21/2022 8:30 AM Medical Record Number: [id number] Patient Account Number: [id number] Date of Birth/Sex: 01/27/1951 (71 y.o. M) Treating RN: Angelina Pih Primary Care Provider: Barbette Reichmann Other Clinician: Referring Provider: Barbette Reichmann Treating Provider/Extender: Rowan Blase in Treatment: 6 Verbal / Phone Orders: No Diagnosis Coding ICD-10 Coding Code Description E11.621 Type 2 diabetes mellitus with foot ulcer 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osed E11.42 Type 2 diabetes mellitus with diabetic polyneuropathy I10 Essential (primary) hypertension Follow-up Appointments Wound #2 Left,Plantar Toe Great o Return Appointment in 1 week. o Nurse Visit as needed Bathing/ Shower/ Hygiene o May shower; gently cleanse wound with antibacterial soap, rinse and pat dry prior to dressing wounds o No tub bath. Anesthetic (Use 'Patient Medications' Section for Anesthetic Order Entry) o Lidocaine applied to wound bed Edema Control - Lymphedema / Segmental Compressive Device / Other o Compression Pump: Use compression pump on left lower extremity for 60 minutes, twice daily. o Compression Pump: Use compression pump on right lower extremity for 60 minutes, twice daily. o DO YOUR BEST to sleep in the bed at night. DO NOT sleep in your recliner. Long hours of sitting in a recliner leads to swelling of the legs and/or potential wounds on your backside. Off-Loading o Offloading felt to foot. - donut  cut in  offloading felt o Other: - front off-loader left Additional Orders / Instructions o Follow Nutritious Diet and Increase Protein Intake o Activity as tolerated Wound Treatment Wound #2 - Toe Great Wound Laterality: Plantar, Left Primary Dressing: Silvercel Small 2x2 (in/in) 1 x Per Day/15 Days Discharge Instructions: Apply Silvercel Small 2x2 (in/in) as instructed Secondary Dressing: Conforming Guaze Roll-Small 1 x Per Day/15 Days Discharge Instructions: Apply Conforming Stretch Guaze Bandage as directed Secured With: Medipore Tape - 68M Medipore H Soft Cloth Surgical Tape, 2x2 (in/yd) 1 x Per Day/15 Days Electronic Signature(s) Signed: 02/21/2022 4:46:59 PM By: Angelina Pih Signed: 02/22/2022 5:53:34 PM By: Lenda Kelp PA-C Entered By: Angelina Pih on 02/21/2022 09:10:06 Gene Brown ([id number]) -------------------------------------------------------------------------------- Problem List Details Patient Name: Gene Brown Date of Service: 02/21/2022 8:30 AM Medical Record Number: [id number] Patient Account Number: [id number] Date of Birth/Sex: January 10, 1951 (71 y.o. M) Treating RN: Angelina Pih Primary Care Provider: Barbette Reichmann Other Clinician: Referring Provider: Barbette Reichmann Treating Provider/Extender: Rowan Blase in Treatment: 6 Active Problems ICD-10 Encounter Code Description Active Date MDM Diagnosis E11.621 Type 2 diabetes mellitus with foot ulcer 01/07/2022 No Yes L97.522 Non-pressure chronic ulcer of other part of left foot with fat layer 01/07/2022 No Yes exposed E11.42 Type 2 diabetes mellitus with diabetic polyneuropathy 01/07/2022 No Yes I10 Essential (primary) hypertension 01/07/2022 No Yes Inactive Problems Resolved Problems Electronic Signature(s) Signed: 02/22/2022 5:53:34 PM By: Lenda Kelp PA-C Entered By: Lenda Kelp on 02/21/2022 08:25:00 Gene Brown  ([id number]) -------------------------------------------------------------------------------- Progress Note Details Patient Name: Gene Brown Date of Service: 02/21/2022 8:30 AM Medical Record Number: [id number] Patient Account Number: [id number] Date of Birth/Sex: [birth year] (71 y.o. M) Treating RN: Angelina Pih Primary Care Provider: Barbette Reichmann Other Clinician: Referring Provider: Barbette Reichmann Treating Provider/Extender: Rowan Blase in Treatment: 6 Subjective Chief Complaint Information obtained from Patient Left great toe ulcer History of Present Illness (HPI) 05/24/16; this is a 71 year old type II diabetic who probably has some degree of polyneuropathy. He tells me that he has had an area on the plantar aspect of his left great toe that is been followed by podiatry for most the last year. He has a lot of callus, the podiatrist which shave this down put a dressing on and he would follow pad monthly or sometimes longer intervals. He has not noted any drainage or pain. He has not had a recent x-ray. No serious attempt at offloading this with modified footwear.. Last hemoglobin A1c that he knows about was over 10 a month ago he is working hard on this with his primary physician. More recently he has been soaking this with peroxide ABIs are noncompressible and the left leg. He was told by his podiatrist he has a bone spur underneath the wound but did not wish any surgical procedures. He has a caregiver at home for a very disabled wife, the nature of her illness is not certain 05/31/16 patient comes in much the same as last week amounts of callus nonviable subcutaneous tissue which required debridement. His x-ray suggested cortical bone irregularity. He will need an MRI. He tells me he has old screws/hardware left knee however he thinks he has had a subsequent MRI after this 06/07/16 at this point in time patient has been attempting to offload his wound as much as he  could on his own. He has made a conscious effort to avoid placing any pressure on the toe as he was moving around and walking and I do believe this  has payed off and what I am seeing at this point in time. overall the wound appears to be somewhat improved in my opinion he is not having any significant discomfort at this point in time. In fact he tells me he has no pain at all. There is no interval sign of surrounding infection in the wound itself has actually decreased in size. Unfortunately the MRI was not ordered last week and therefore we do need to proceed with the MRI order today. again this is to evaluate the cortical bone irregularity noted on x-ray. 06/14/16 Patient has continued to use the offloading shoe at this point in time. He tells me this is somewhat difficult to get used to walking and but nonetheless he is very strict about wearing it. Again I believe this has paid off very well for him and his wound appears to be greatly improved. He does have some callused area surrounding the wound proximally that has caused some fluid collection of maceration of the callused area. This likely will require debridement today. Some adherent slough noted over the wound bed. 06/21/16; patient's wound on the plantar aspect of his left great toe is totally epithelialized. His MRI of the foot is tomorrow to follow-up on the cortical irregularity noted on plain x-ray. He does not have diabetic foot where 06-28-16-He comes in today with a continued healed left great toe. MRI performed on 06/23/2016 shows no abscess osteomyelitis or septic joint. Patient was discharged from wound care center today. Readmission: 01-07-2022 upon evaluation today patient appears to be doing somewhat poorly in regard to total ulcer on his left foot. Fortunately there does not appear to be any signs of infection which is great news. Has been seen by Dr. Excell Seltzer and he did request from Dr. Excell Seltzer to come see [place] due to the fact that in  the past several years back we were able to get one of his wounds healed about 4 weeks. Again I explained that that is always variable but nonetheless I definitely think we can help. Fortunately I see no evidence of infection. He does have a foot offloading shoe which I think is good to attempt for now he was able to get this healed before with a peg assist. Patient does have a history of type 2 diabetes, diabetic polyneuropathy, and hypertension. This current wound started on 12-09-2021. 01-20-2022 upon evaluation today patient appears to be doing poorly in regard to his great toe. I am actually very concerned based on what I am seeing here at this point about how things are progressing. I do not believe there is any evidence of infection systemically that is obvious but locally I am concerned about the possibility this could even be an osteomyelitis type situation. 01-28-2022 upon evaluation today patient appears to be doing well with regard to his toe ulcer compared to what we were previous. Fortunately I do not see any signs of active infection at this time which is great news. No fevers, chills, nausea, vomiting, or diarrhea. With that being said the patient has not started his Augmentin yet which I definitely think he needs to get on board ASAP. I do see some signs of new skin growth I think he is using the front offloading shoe which is good for the time being. 02-04-2022 upon evaluation today patient appears to be doing okay in regard to his toe ulcer this is actually measuring a little bit smaller but still has a bit of callus buildup but not as much  as we have seen in the past. Fortunately I do not see any evidence of active infection locally or systemically which is great news. No fevers, chills, nausea, vomiting, or diarrhea. Marland Kitchen. 02-14-2022 upon evaluation today patient's wound is actually showing signs of good improvement which is great news. Fortunately I do not see any evidence of active  infection locally or systemically which is great news as well. No fevers, chills, nausea, vomiting, or diarrhea. 02-21-2022 upon evaluation today patient's toe ulcer actually is showing signs of excellent improvement. I do not see any evidence of infection locally or systemically which is great news and overall I am extremely pleased with where we stand today. I do not see any signs of active infection locally or systemically which is great news. No fevers, chills, nausea, vomiting, or diarrhea. Gene Brown, Gene V. ([id number]) Objective Constitutional Well-nourished and well-hydrated in no acute distress. Vitals Time Taken: 8:30 AM, Height: 68 in, Weight: 217 lbs, BMI: 33, Temperature: 97.6 F, Pulse: 65 bpm, Respiratory Rate: 18 breaths/min, Blood Pressure: 176/78 mmHg. Respiratory normal breathing without difficulty. Psychiatric this patient is able to make decisions and demonstrates good insight into disease process. Alert and Oriented x 3. pleasant and cooperative. General Notes: Upon inspection patient's wound bed actually showed signs of good granulation and epithelization at this point. Fortunately I do not see any evidence of infection locally or systemically which is great news and overall I am extremely pleased with where we stand currently. Integumentary (Hair, Skin) Wound #2 status is Open. Original cause of wound was Gradually Appeared. The date acquired was: 12/09/2021. The wound has been in treatment 6 weeks. The wound is located on the Masco CorporationLeft,Plantar Toe Great. The wound measures 1.2cm length x 1.2cm width x 0.3cm depth; 1.131cm^2 area and 0.339cm^3 volume. There is Fat Layer (Subcutaneous Tissue) exposed. There is no tunneling or undermining noted. There is a medium amount of serous drainage noted. The wound margin is flat and intact. There is large (67-100%) pink, pale, hyper - granulation within the wound bed. There is a small (1-33%) amount of necrotic tissue within the wound bed  including Adherent Slough. Assessment Active Problems ICD-10 Type 2 diabetes mellitus with foot ulcer Non-pressure chronic ulcer of other part of left foot with fat layer exposed Type 2 diabetes mellitus with diabetic polyneuropathy Essential (primary) hypertension Procedures Wound #2 Pre-procedure diagnosis of Wound #2 is a Diabetic Wound/Ulcer of the Lower Extremity located on the Left,Plantar Toe Great .Severity of Tissue Pre Debridement is: Fat layer exposed. There was a Excisional Skin/Subcutaneous Tissue Debridement with a total area of 1.44 sq cm performed by Nelida MeuseStone, Jamariah Tony E., PA-C. With the following instrument(s): Curette to remove Viable and Non-Viable tissue/material. Material removed includes Callus, Subcutaneous Tissue, and Slough. No specimens were taken. A time out was conducted at 08:58, prior to the start of the procedure. A Minimum amount of bleeding was controlled with Pressure. The procedure was tolerated well. Post Debridement Measurements: 1.2cm length x 1.2cm width x 0.3cm depth; 0.339cm^3 volume. Character of Wound/Ulcer Post Debridement is stable. Severity of Tissue Post Debridement is: Fat layer exposed. Post procedure Diagnosis Wound #2: Same as Pre-Procedure Plan Follow-up Appointments: Wound #2 Left,Plantar Toe Great: Return Appointment in 1 week. Nurse Visit as needed Bathing/ Shower/ Hygiene: May shower; gently cleanse wound with antibacterial soap, rinse and pat dry prior to dressing wounds No tub bath. Anesthetic (Use 'Patient Medications' Section for Anesthetic Order Entry): Gene Brown, Gene V. ([id number]) Lidocaine applied to wound bed Edema Control - Lymphedema /  Segmental Compressive Device / Other: Compression Pump: Use compression pump on left lower extremity for 60 minutes, twice daily. Compression Pump: Use compression pump on right lower extremity for 60 minutes, twice daily. DO YOUR BEST to sleep in the bed at night. DO NOT sleep in your  recliner. Long hours of sitting in a recliner leads to swelling of the legs and/or potential wounds on your backside. Off-Loading: Offloading felt to foot. - donut cut in offloading felt Other: - front off-loader left Additional Orders / Instructions: Follow Nutritious Diet and Increase Protein Intake Activity as tolerated WOUND #2: - Toe Great Wound Laterality: Plantar, Left Primary Dressing: Silvercel Small 2x2 (in/in) 1 x Per Day/15 Days Discharge Instructions: Apply Silvercel Small 2x2 (in/in) as instructed Secondary Dressing: Conforming Guaze Roll-Small 1 x Per Day/15 Days Discharge Instructions: Apply Conforming Stretch Guaze Bandage as directed Secured With: Medipore Tape - 82M Medipore H Soft Cloth Surgical Tape, 2x2 (in/yd) 1 x Per Day/15 Days 1. Based on what I am seeing I do believe that the patient is doing decently well currently in regard to his wound I feel like that it is on the right track and actually looks like is doing significantly better from an overall visual perspective. 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 2. I am also can recommend that we use rolled gauze to secure in place along with gauze behind it in order to catch any excessive drainage. We will see patient back for reevaluation in 1 week here in the clinic. If anything worsens or changes patient will contact our office for additional recommendations. Electronic Signature(s) Signed: 02/21/2022 9:21:46 AM By: Lenda Kelp PA-C Entered By: Lenda Kelp on 02/21/2022 09:21:45 Gene Brown ([id number]) -------------------------------------------------------------------------------- SuperBill Details Patient Name: Gene Brown Date of Service: 02/21/2022 Medical Record Number: [id number] Patient Account Number: [id number] Date of Birth/Sex: 1951-04-08 (71 y.o. 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 I10 Essential (primary) hypertension Facility Procedures CPT4 Code: 19417408 Description: 11042 - DEB SUBQ TISSUE 20 SQ CM/< Modifier: Quantity: 1 CPT4 Code: Description: ICD-10 Diagnosis Description 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 Modifier: osed Quantity: Physician Procedures CPT4 Code: 1448185 Description: 11042 - WC PHYS SUBQ TISS 20 SQ CM Modifier: Quantity: 1 CPT4 Code: Description: ICD-10 Diagnosis Description 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 Modifier: osed Quantity: Electronic Signature(s) Signed: 02/21/2022 9:21:52 AM By: Lenda Kelp PA-C Entered By: Lenda Kelp on 02/21/2022 09:21:52

## 2022-02-22 NOTE — Progress Notes (Signed)
Gene Brown, Gene Brown ([id number]) Visit Report for 02/21/2022 Arrival Information Details Patient Name: Gene Brown, Gene Brown Date of Service: 02/21/2022 8:30 AM Medical Record Number: [id number] Patient Account Number: [id number] Date of Birth/Sex: 1951/02/21 (71 y.o. M) Treating RN: Levora Dredge Primary Care Valbona Slabach: Tracie Harrier Other Clinician: Referring Marybeth Dandy: Tracie Harrier Treating Arnetia Bronk/Extender: Skipper Cliche in Treatment: 6 Visit Information History Since Last Visit Added or deleted any medications: No Patient Arrived: Gene Brown Any new allergies or adverse reactions: No Arrival Time: 08:26 Had a fall or experienced change in No Accompanied By: self activities of daily living that may affect Transfer Assistance: None risk of falls: Patient Identification Verified: Yes Hospitalized since last visit: No Secondary Verification Process Completed: Yes Has Dressing in Place as Prescribed: Yes Patient Has Alerts: Yes Has Footwear/Offloading in Place as Prescribed: Yes Patient Alerts: Patient on Blood Thinner Left: Surgical Shoe with Pressure Relief 01/07/2022 [place]>220 on left Insole Pain Present Now: No Electronic Signature(s) Signed: 02/21/2022 4:46:59 PM By: Levora Dredge Entered By: Levora Dredge on 02/21/2022 08:30:50 Gene Brown ([id number]) -------------------------------------------------------------------------------- Clinic Level of Care Assessment Details Patient Name: Gene Brown Date of Service: 02/21/2022 8:30 AM Medical Record Number: [id number] Patient Account Number: [id number] Date of Birth/Sex: [birth year] (71 y.o. M) Treating RN: Levora Dredge Primary Care Jaegar Croft: Tracie Harrier Other Clinician: Referring Franciso Dierks: Tracie Harrier Treating Tynleigh Birt/Extender: Skipper Cliche in Treatment: 6 Clinic Level of Care Assessment Items TOOL 1 Quantity Score []  - Use when EandM and Procedure is performed on INITIAL visit 0 ASSESSMENTS  - Nursing Assessment / Reassessment []  - General Physical Exam (combine w/ comprehensive assessment (listed just below) when performed on new 0 pt. evals) []  - 0 Comprehensive Assessment (HX, ROS, Risk Assessments, Wounds Hx, etc.) ASSESSMENTS - Wound and Skin Assessment / Reassessment []  - Dermatologic / Skin Assessment (not related to wound area) 0 ASSESSMENTS - Ostomy and/or Continence Assessment and Care []  - Incontinence Assessment and Management 0 []  - 0 Ostomy Care Assessment and Management (repouching, etc.) PROCESS - Coordination of Care []  - Simple Patient / Family Education for ongoing care 0 []  - 0 Complex (extensive) Patient / Family Education for ongoing care []  - 0 Staff obtains Programmer, systems, Records, Test Results / Process Orders []  - 0 Staff telephones HHA, Nursing Homes / Clarify orders / etc []  - 0 Routine Transfer to another Facility (non-emergent condition) []  - 0 Routine Hospital Admission (non-emergent condition) []  - 0 New Admissions / Biomedical engineer / Ordering NPWT, Apligraf, etc. []  - 0 Emergency Hospital Admission (emergent condition) PROCESS - Special Needs []  - Pediatric / Minor Patient Management 0 []  - 0 Isolation Patient Management []  - 0 Hearing / Language / Visual special needs []  - 0 Assessment of Community assistance (transportation, D/C planning, etc.) []  - 0 Additional assistance / Altered mentation []  - 0 Support Surface(s) Assessment (bed, cushion, seat, etc.) INTERVENTIONS - Miscellaneous []  - External ear exam 0 []  - 0 Patient Transfer (multiple staff / Civil Service fast streamer / Similar devices) []  - 0 Simple Staple / Suture removal (25 or less) []  - 0 Complex Staple / Suture removal (26 or more) []  - 0 Hypo/Hyperglycemic Management (do not check if billed separately) []  - 0 Ankle / Brachial Index (ABI) - do not check if billed separately Has the patient been seen at the hospital within the last three years: Yes Total Score:  0 Level Of Care: ____ Gene Brown ([id number]) Electronic Signature(s) Signed: 02/21/2022 4:46:59 PM By: Levora Dredge Entered By:  Levora Dredge on 02/21/2022 09:10:25 Gene Brown, Gene Brown ([id number]) -------------------------------------------------------------------------------- Encounter Discharge Information Details Patient Name: Gene Brown Date of Service: 02/21/2022 8:30 AM Medical Record Number: [id number] Patient Account Number: [id number] Date of Birth/Sex: 1951/01/04 (71 y.o. M) Treating RN: Levora Dredge Primary Care Galan Ghee: Tracie Harrier Other Clinician: Referring Luddie Boghosian: Tracie Harrier Treating Emilly Lavey/Extender: Skipper Cliche in Treatment: 6 Encounter Discharge Information Items Post Procedure Vitals Discharge Condition: Stable Temperature (F): 97.6 Ambulatory Status: Cane Pulse (bpm): 65 Discharge Destination: Home Respiratory Rate (breaths/min): 18 Transportation: Private Auto Blood Pressure (mmHg): 176/78 Accompanied By: self Schedule Follow-up Appointment: Yes Clinical Summary of Care: Electronic Signature(s) Signed: 02/21/2022 4:46:59 PM By: Levora Dredge Entered By: Levora Dredge on 02/21/2022 09:11:26 Gene Brown ([id number]) -------------------------------------------------------------------------------- Lower Extremity Assessment Details Patient Name: Gene Brown Date of Service: 02/21/2022 8:30 AM Medical Record Number: [id number] Patient Account Number: [id number] Date of Birth/Sex: 1950-09-14 (71 y.o. M) Treating RN: Levora Dredge Primary Care Avedis Bevis: Tracie Harrier Other Clinician: Referring Katieann Hungate: Tracie Harrier Treating Isabeau Mccalla/Extender: Jeri Cos Weeks in Treatment: 6 Edema Assessment Assessed: [Left: No] [Right: No] Edema: [Left: Ye] [Right: s] Electronic Signature(s) Signed: 02/21/2022 4:46:59 PM By: Levora Dredge Entered By: Levora Dredge on 02/21/2022 08:40:30 Gene Brown  ([id number]) -------------------------------------------------------------------------------- Multi Wound Chart Details Patient Name: Gene Brown Date of Service: 02/21/2022 8:30 AM Medical Record Number: [id number] Patient Account Number: [id number] Date of Birth/Sex: [birth year] (71 y.o. 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 Comorbid History: Cataracts, Sickle Cell Disease, Sleep N/A N/A Apnea, Hypertension, Type II Diabetes, Gout, Osteoarthritis Date Acquired: 12/09/2021 N/A N/A Weeks of Treatment: 6 N/A N/A Wound Status: Open N/A N/A Wound Recurrence: No N/A N/A Measurements L x W x D (cm) 1.2x1.2x0.3 N/A N/A Area (cm) : 1.131 N/A N/A Volume (cm) : 0.339 N/A N/A % Reduction in Area: -299.60% N/A N/A % Reduction in Volume: -140.40% N/A N/A Classification: Grade 2 N/A N/A Exudate Amount: Medium N/A N/A Exudate Type: Serous N/A N/A Exudate Color: amber N/A N/A Wound Margin: Flat and Intact N/A N/A Granulation Amount: Large (67-100%) N/A N/A Granulation Quality: Pink, Pale, Hyper-granulation N/A N/A Necrotic Amount: Small (1-33%) N/A N/A Exposed Structures: Fat Layer (Subcutaneous Tissue): N/A N/A Yes Fascia: No Tendon: No Muscle: No Joint: No Bone: No Epithelialization: None N/A N/A Treatment Notes Electronic Signature(s) Signed: 02/21/2022 4:46:59 PM By: Levora Dredge Entered By: Levora Dredge on 02/21/2022 08:41:04 Gene Brown ([id number] Brown ([id number]) -------------------------------------------------------------------------------- Multi-Disciplinary Care Plan Details Patient Name: Gene Brown Date of Service: 02/21/2022 8:30 AM Medical Record Number: [id number] Patient Account Number: [id number] Date of Birth/Sex: 17-Dec-1950 (71 y.o. M) Treating RN: Levora Dredge Primary Care Meredyth Hornung: Tracie Harrier Other Clinician: Referring Antoniette Peake: Tracie Harrier Treating Levi Klaiber/Extender: Skipper Cliche in Treatment: 6 Active Inactive Wound/Skin Impairment Nursing Diagnoses: Impaired tissue integrity Knowledge deficit related to smoking impact on wound healing Goals: Patient/caregiver will verbalize understanding of skin care regimen Date Initiated: 01/07/2022 Date Inactivated: 02/21/2022 Target Resolution Date: 01/07/2022 Goal Status: Met Ulcer/skin breakdown will have a volume reduction of 30% by week 4 Date Initiated: 01/07/2022 Target Resolution Date: 02/04/2022 Goal Status: Active Ulcer/skin  breakdown will have a volume reduction of 50% by week 8 Date Initiated: 01/07/2022 Target Resolution Date: 03/03/2022 Goal Status: Active Interventions: Assess patient/caregiver ability to obtain necessary supplies Assess patient/caregiver ability to perform ulcer/skin care regimen upon admission and as needed Assess ulceration(s) every visit Treatment Activities: Referred to DME Kara Melching for dressing supplies : 01/07/2022 Skin care regimen initiated : 01/07/2022 Topical wound management initiated : 01/07/2022 Notes: Electronic Signature(s) Signed: 02/21/2022 4:46:59 PM By: Levora Dredge Entered By: Levora Dredge on 02/21/2022 08:40:56 Gene Brown ([id number]) -------------------------------------------------------------------------------- Pain Assessment Details Patient Name: Gene Brown Date of Service: 02/21/2022 8:30 AM Medical Record Number: [id number] Patient Account Number: [id number] Date of Birth/Sex:  Aug 14, 1950 (71 y.o. M) Treating RN: Levora Dredge Primary Care Harvel Meskill: Tracie Harrier Other Clinician: Referring Nahiara Kretzschmar: Tracie Harrier Treating Dollye Glasser/Extender: Skipper Cliche in Treatment: 6 Active Problems Location of Pain Severity and Description of Pain Patient Has Paino No Site Locations Rate the pain. Current Pain Level: 0 Pain Management and Medication Current Pain Management: Electronic Signature(s) Signed: 02/21/2022 4:46:59 PM By: Levora Dredge Entered By: Levora Dredge on 02/21/2022 08:32:45 Gene Brown ([id number]) -------------------------------------------------------------------------------- Patient/Caregiver Education Details Patient Name: Gene Brown Date of Service: 02/21/2022 8:30 AM Medical Record Number: [id number] Patient Account Number: [id number] Date of Birth/Gender: 29-Apr-1951 (71 y.o. M) Treating RN: Levora Dredge Primary Care Physician: Tracie Harrier Other Clinician: Referring Physician: Tracie Harrier Treating Physician/Extender: Skipper Cliche in Treatment: 6 Education Assessment Education Provided To: Patient Education Topics Provided Wound Debridement: Handouts: Wound Debridement Methods: Explain/Verbal Responses: State content correctly Wound/Skin Impairment: Handouts: Caring for Your Ulcer Methods: Explain/Verbal Responses: State content correctly Electronic Signature(s) Signed: 02/21/2022 4:46:59 PM By: Levora Dredge Entered By: Levora Dredge on 02/21/2022 09:10:46 Gene Brown ([id number]) -------------------------------------------------------------------------------- Wound Assessment Details Patient Name: Gene Brown Date of Service: 02/21/2022 8:30 AM Medical Record Number: [id number] Patient Account Number: [id number] Date of Birth/Sex: [birth year] (71 y.o. M) Treating RN: Levora Dredge Primary Care Gonsalo Cuthbertson: Tracie Harrier Other Clinician: Referring Emmanual Gauthreaux: Tracie Harrier Treating Shantara Goosby/Extender: Skipper Cliche in Treatment: 6 Wound Status Wound Number: 2 Primary Diabetic Wound/Ulcer of the Lower Extremity Etiology: Wound Location: Left, Plantar Toe Great Wound Open Wounding Event: Gradually Appeared Status: Date Acquired: 12/09/2021 Comorbid Cataracts, Sickle Cell Disease, Sleep Apnea, Weeks Of Treatment: 6 History: Hypertension, Type II Diabetes, Gout, Osteoarthritis Clustered Wound: No Photos Wound Measurements Length: (cm) 1.2 Width: (cm) 1.2 Depth: (cm) 0.3 Area: (cm) 1.131 Volume: (cm) 0.339 % Reduction in Area: -299.6% % Reduction in Volume: -140.4% Epithelialization: None Tunneling: No Undermining: No Wound Description Classification: Grade 2 F Wound Margin: Flat and Intact S Exudate Amount: Medium Exudate Type: Serous Exudate Color: amber oul Odor After Cleansing: No lough/Fibrino Yes Wound Bed Granulation Amount: Large (67-100%) Exposed Structure Granulation Quality: Pink, Pale, Hyper-granulation Fascia Exposed: No Necrotic Amount: Small (1-33%) Fat Layer (Subcutaneous Tissue) Exposed: Yes Necrotic Quality: Adherent Slough Tendon Exposed: No Muscle Exposed: No Joint Exposed: No Bone Exposed: No Treatment Notes Wound #2 (Toe Great) Wound Laterality: Plantar, Left Cleanser Peri-Wound Care Topical LATWAN, LUCHSINGER ([id number]) Primary Dressing Silvercel Small 2x2 (in/in) Discharge Instruction: Apply Silvercel Small 2x2 (in/in) as instructed Secondary Dressing [place] Roll-Small Discharge Instruction: [place] as directed Secured With Medipore Tape - 42M Medipore H Soft Cloth Surgical Tape, 2x2 (in/yd) Compression Wrap Compression Stockings Add-Ons Electronic Signature(s) Signed: 02/21/2022 4:46:59 PM By: Levora Dredge Entered By: Levora Dredge on 02/21/2022 08:39:47 Gene Brown  ([id number]) -------------------------------------------------------------------------------- Vitals Details Patient Name:  Gene Brown Date of Service: 02/21/2022 8:30 AM Medical Record Number: [id number] Patient Account Number: [id number] Date of Birth/Sex: April 13, 1951 (71 y.o. M) Treating RN: Levora Dredge Primary Care Kayra Crowell: Tracie Harrier Other Clinician: Referring Kashay Cavenaugh: Tracie Harrier Treating Estephania Licciardi/Extender: Skipper Cliche in Treatment: 6 Vital Signs Time Taken: 08:30 Temperature (F): 97.6 Height (in): 68 Pulse (bpm): 65 Weight (lbs): 217 Respiratory Rate (breaths/min): 18 Body Mass Index (BMI): 33 Blood Pressure (mmHg): 176/78 Reference Range: 80 - 120 mg / dl Electronic Signature(s) Signed: 02/21/2022 4:46:59 PM By: Levora Dredge Entered By: Levora Dredge on 02/21/2022 08:32:38

## 2022-02-28 ENCOUNTER — Encounter: Payer: Medicare Other | Admitting: Internal Medicine

## 2022-02-28 DIAGNOSIS — E11621 Type 2 diabetes mellitus with foot ulcer: Secondary | ICD-10-CM | POA: Diagnosis not present

## 2022-03-02 NOTE — Progress Notes (Signed)
KWABENA, STRUTZ ([id number]) Visit Report for 02/28/2022 Arrival Information Details Patient Name: Gene Brown, Gene Brown Date of Service: 02/28/2022 11:15 AM Medical Record Number: [id number] Patient Account Number: [id number] Date of Birth/Sex: [birth year] (71 y.o. M) Treating RN: Cornell Barman Primary Care Shandell Giovanni: Tracie Harrier Other Clinician: Massie Kluver Referring Kiara Mcdowell: Tracie Harrier Treating Kaycee Mcgaugh/Extender: Tito Dine in Treatment: 7 Visit Information History Since Last Visit All ordered tests and consults were completed: No Patient Arrived: Gene Brown Added or deleted any medications: No Arrival Time: 11:38 Any new allergies or adverse reactions: No Transfer Assistance: None Had a fall or experienced change in No Patient Has Alerts: Yes activities of daily living that may affect Patient Alerts: Patient on Blood Thinner risk of falls: 01/07/2022 Gene Brown>220 on left Hospitalized since last visit: No Pain Present Now: No Electronic Signature(s) Signed: 03/02/2022 8:24:32 AM By: Massie Kluver Entered By: Massie Kluver on 02/28/2022 11:43:03 Gene Brown ([id number]) -------------------------------------------------------------------------------- Clinic Level of Care Assessment Details Patient Name: Gene Brown Date of Service: 02/28/2022 11:15 AM Medical Record Number: [id number] Patient Account Number: [id number] Date of Birth/Sex: 1951-05-25 (71 y.o. M) Treating RN: Cornell Barman Primary Care Alyssa Rotondo: Tracie Harrier Other Clinician: Massie Kluver Referring Necie Wilcoxson: Tracie Harrier Treating Tayshaun Kroh/Extender: Tito Dine in Treatment: 7 Clinic Level of Care Assessment Items TOOL 1 Quantity Score []  - Use when EandM and Procedure is performed on INITIAL visit 0 ASSESSMENTS - Nursing Assessment / Reassessment []  - General Physical Exam (combine w/ comprehensive assessment (listed just below) when performed on new 0 pt. evals) []  -  0 Comprehensive Assessment (HX, ROS, Risk Assessments, Wounds Hx, etc.) ASSESSMENTS - Wound and Skin Assessment / Reassessment []  - Dermatologic / Skin Assessment (not related to wound area) 0 ASSESSMENTS - Ostomy and/or Continence Assessment and Care []  - Incontinence Assessment and Management 0 []  - 0 Ostomy Care Assessment and Management (repouching, etc.) PROCESS - Coordination of Care []  - Simple Patient / Family Education for ongoing care 0 []  - 0 Complex (extensive) Patient / Family Education for ongoing care []  - 0 Staff obtains Consents, Records, Test Results / Process Orders []  - 0 Staff telephones HHA, Nursing Homes / Clarify orders / etc []  - 0 Routine Transfer to another Facility (non-emergent condition) []  - 0 Routine Hospital Admission (non-emergent condition) []  - 0 New Admissions / Biomedical engineer / Ordering NPWT, Apligraf, etc. []  - 0 Emergency Hospital Admission (emergent condition) PROCESS - Special Needs []  - Pediatric / Minor Patient Management 0 []  - 0 Isolation Patient Management []  - 0 Hearing / Language / Visual special needs []  - 0 Assessment of Community assistance (transportation, D/C planning, etc.) []  - 0 Additional assistance / Altered mentation []  - 0 Support Surface(s) Assessment (bed, cushion, seat, etc.) INTERVENTIONS - Miscellaneous []  - External ear exam 0 []  - 0 Patient Transfer (multiple staff / Civil Service fast streamer / Similar devices) []  - 0 Simple Staple / Suture removal (25 or less) []  - 0 Complex Staple / Suture removal (26 or more) []  - 0 Hypo/Hyperglycemic Management (do not check if billed separately) []  - 0 Ankle / Brachial Index (ABI) - do not check if billed separately Has the patient been seen at the hospital within the last three years: Yes Total Score: 0 Level Of Care: ____ Gene Brown ([id number]) Electronic Signature(s) Signed: 03/02/2022 8:24:32 AM By: Massie Kluver Entered By: Massie Kluver on  02/28/2022 12:03:44 Gene Brown ([id number]) -------------------------------------------------------------------------------- Encounter Discharge Information Details Patient Name: Gene Brown  Date of Service: 02/28/2022 11:15 AM Medical Record Number: [id number] Patient Account Number: [id number] Date of Birth/Sex: September 06, 1950 (71 y.o. M) Treating RN: Cornell Barman Primary Care Shrinika Blatz: Tracie Harrier Other Clinician: Massie Kluver Referring Jaxon Flatt: Tracie Harrier Treating Avina Eberle/Extender: Tito Dine in Treatment: 7 Encounter Discharge Information Items Post Procedure Vitals Discharge Condition: Stable Temperature (F): 98.2 Ambulatory Status: Cane Pulse (bpm): 86 Discharge Destination: Home Respiratory Rate (breaths/min): 18 Transportation: Private Auto Blood Pressure (mmHg): 158/89 Accompanied By: self Schedule Follow-up Appointment: No Clinical Summary of Care: Electronic Signature(s) Signed: 03/02/2022 8:24:32 AM By: Massie Kluver Entered By: Massie Kluver on 02/28/2022 12:14:34 Gene Brown ([id number]) -------------------------------------------------------------------------------- Lower Extremity Assessment Details Patient Name: Gene Brown Date of Service: 02/28/2022 11:15 AM Medical Record Number: [id number] Patient Account Number: [id number] Date of Birth/Sex: August 24, 1950 (71 y.o. M) Treating RN: Cornell Barman Primary Care Kayd Launer: Tracie Harrier Other Clinician: Massie Kluver Referring Laporche Martelle: Tracie Harrier Treating Ivan Lacher/Extender: Tito Dine in Treatment: 7 Electronic Signature(s) Signed: 02/28/2022 5:31:53 PM By: [name] BSN, RN, CWS, Kim RN, BSN Signed: 03/02/2022 8:24:32 AM By: Massie Kluver Entered By: Massie Kluver on 02/28/2022 11:55:11 Gene Brown ([id number]) -------------------------------------------------------------------------------- Multi Wound Chart Details Patient Name: Gene Brown Date of Service: 02/28/2022 11:15 AM Medical Record Number: [id number] Patient Account Number: [id number] Date of Birth/Sex: 11-19-1950 (71 y.o. M) Treating RN: Cornell Barman Primary Care Reagyn Facemire: Tracie Harrier Other Clinician: Massie Kluver Referring Jerre Vandrunen: Tracie Harrier Treating Annagrace Carr/Extender: Tito Dine in Treatment: 7 Vital Signs Height(in): 68 Pulse(bpm): 77 Weight(lbs): 217 Blood Pressure(mmHg): 158/89 Body Mass Index(BMI): 33 Temperature(F): 98.2 Respiratory Rate(breaths/min): 18 Photos: [N/A:N/A] Wound Location: Left, Plantar Toe Great N/A N/A Wounding Event: Gradually Appeared N/A N/A Primary Etiology: Diabetic Wound/Ulcer of the Lower N/A N/A Extremity Comorbid History: Cataracts, Sickle Cell Disease, Sleep N/A N/A Apnea, Hypertension, Type II Diabetes, Gout, Osteoarthritis Date Acquired: 12/09/2021 N/A N/A Weeks of Treatment: 7 N/A N/A Wound Status: Open N/A N/A Wound Recurrence: No N/A N/A Measurements L x W x D (cm) 1.2x1x0.3 N/A N/A Area (cm) : 0.942 N/A N/A Volume (cm) : 0.283 N/A N/A % Reduction in Area: -232.90% N/A N/A % Reduction in Volume: -100.70% N/A N/A Classification: Grade 2 N/A N/A Exudate Amount: Medium N/A N/A Exudate Type: Serous N/A N/A Exudate Color: amber N/A N/A Wound Margin: Flat and Intact N/A N/A Granulation Amount: Large (67-100%) N/A N/A Granulation Quality: Pink, Pale, Hyper-granulation N/A N/A Necrotic Amount: Small (1-33%) N/A N/A Exposed Structures: Fat Layer (Subcutaneous Tissue): N/A N/A Yes Fascia: No Tendon: No Muscle: No Joint: No Bone: No Epithelialization: None N/A N/A Treatment Notes Electronic Signature(s) Signed: 03/02/2022 8:24:32 AM By: Massie Kluver Entered By: Massie Kluver on 02/28/2022 11:55:24 Gene Brown ([id number] Brown ([id number]) -------------------------------------------------------------------------------- Multi-Disciplinary Care Plan  Details Patient Name: Gene Brown Date of Service: 02/28/2022 11:15 AM Medical Record Number: [id number] Patient Account Number: [id number] Date of Birth/Sex: 12/26/1950 (71 y.o. M) Treating RN: Cornell Barman Primary Care Keyon Liller: Tracie Harrier Other Clinician: Massie Kluver Referring Rehana Uncapher: Tracie Harrier Treating Henretter Piekarski/Extender: Tito Dine in Treatment: 7 Active Inactive Wound/Skin Impairment Nursing Diagnoses: Impaired tissue integrity Knowledge deficit related to smoking impact on wound healing Goals: Patient/caregiver will verbalize understanding of skin care regimen Date Initiated: 01/07/2022 Date Inactivated: 02/21/2022 Target Resolution Date: 01/07/2022 Goal Status: Met Ulcer/skin breakdown will have a volume reduction of 30% by week 4 Date Initiated: 01/07/2022 Target Resolution Date: 02/04/2022 Goal Status: Active Ulcer/skin breakdown will have a volume reduction of  50% by week 8 Date Initiated: 01/07/2022 Target Resolution Date: 03/03/2022 Goal Status: Active Interventions: Assess patient/caregiver ability to obtain necessary supplies Assess patient/caregiver ability to perform ulcer/skin care regimen upon admission and as needed Assess ulceration(s) every visit Treatment Activities: Referred to DME Antonie Borjon for dressing supplies : 01/07/2022 Skin care regimen initiated : 01/07/2022 Topical wound management initiated : 01/07/2022 Notes: Electronic Signature(s) Signed: 02/28/2022 5:31:53 PM By: [name], BSN, RN, CWS, Kim RN, BSN Signed: 03/02/2022 8:24:32 AM By: Massie Kluver Entered By: Massie Kluver on 02/28/2022 11:55:15 Gene Brown ([id number]) -------------------------------------------------------------------------------- Pain Assessment Details Patient Name: Gene Brown Date of Service: 02/28/2022 11:15 AM Medical Record Number: [id number] Patient Account Number: [id number] Date of Birth/Sex: Dec 01, 1950 (71 y.o. M) Treating RN: Cornell Barman Primary Care Xavion Muscat: Tracie Harrier Other Clinician: Massie Kluver Referring Sanaii Caporaso: Tracie Harrier Treating Brantlee Hinde/Extender: Tito Dine in Treatment: 7 Active Problems Location of Pain Severity and Description of Pain Patient Has Paino No Site Locations Pain Management and Medication Current Pain Management: Electronic Signature(s) Signed: 02/28/2022 5:31:53 PM By: [name], BSN, RN, CWS, Kim RN, BSN Signed: 03/02/2022 8:24:32 AM By: Massie Kluver Entered By: Massie Kluver on 02/28/2022 11:45:31 Gene Brown ([id number]) -------------------------------------------------------------------------------- Patient/Caregiver Education Details Patient Name: Gene Brown Date of Service: 02/28/2022 11:15 AM Medical Record Number: [id number] Patient Account Number: [id number] Date of Birth/Gender: Feb 24, 1951 (71 y.o. M) Treating RN: Cornell Barman Primary Care Physician: Tracie Harrier Other Clinician: Massie Kluver Referring Physician: Tracie Harrier Treating Physician/Extender: Tito Dine in Treatment: 7 Education Assessment Education Provided To: Patient Education Topics Provided Wound/Skin Impairment: Handouts: Other: continue wound care as directed Electronic Signature(s) Signed: 03/02/2022 8:24:32 AM By: Massie Kluver Entered By: Massie Kluver on 02/28/2022 12:04:16 Gene Brown ([id number]) -------------------------------------------------------------------------------- Wound Assessment Details Patient Name: Gene Brown Date of Service: 02/28/2022 11:15 AM Medical Record Number: [id number] Patient Account Number: [id number] Date of Birth/Sex: 13-Sep-1950 (71 y.o. M) Treating RN: Cornell Barman Primary Care Sarahanne Novakowski: Tracie Harrier Other Clinician: Massie Kluver Referring Lowery Paullin: Tracie Harrier Treating Anel Creighton/Extender: Tito Dine in Treatment: 7 Wound Status Wound Number: 2 Primary Diabetic  Wound/Ulcer of the Lower Extremity Etiology: Wound Location: Left, Plantar Toe Great Wound Open Wounding Event: Gradually Appeared Status: Date Acquired: 12/09/2021 Comorbid Cataracts, Sickle Cell Disease, Sleep Apnea, Weeks Of Treatment: 7 History: Hypertension, Type II Diabetes, Gout, Osteoarthritis Clustered Wound: No Photos Wound Measurements Length: (cm) 1.2 Width: (cm) 1 Depth: (cm) 0.3 Area: (cm) 0.942 Volume: (cm) 0.283 % Reduction in Area: -232.9% % Reduction in Volume: -100.7% Epithelialization: None Wound Description Classification: Grade 2 Wound Margin: Flat and Intact Exudate Amount: Medium Exudate Type: Serous Exudate Color: amber Foul Odor After Cleansing: No Slough/Fibrino Yes Wound Bed Granulation Amount: Large (67-100%) Exposed Structure Granulation Quality: Pink, Pale, Hyper-granulation Fascia Exposed: No Necrotic Amount: Small (1-33%) Fat Layer (Subcutaneous Tissue) Exposed: Yes Necrotic Quality: Adherent Slough Tendon Exposed: No Muscle Exposed: No Joint Exposed: No Bone Exposed: No Treatment Notes Wound #2 (Toe Great) Wound Laterality: Plantar, Left Cleanser Peri-Wound Care Topical Gene Brown, Gene Brown ([id number]) Primary Dressing Silvercel Small 2x2 (in/in) Discharge Instruction: Apply Silvercel Small 2x2 (in/in) as instructed Secondary Dressing Conforming [place] Roll-Small Discharge Instruction: Apply Conforming Stretch Guaze Bandage as directed Secured With Medipore Tape - 28M Medipore H Soft Cloth Surgical Tape, 2x2 (in/yd) Compression Wrap Compression Stockings Environmental education officer) Signed: 02/28/2022 5:31:53 PM By: [name], BSN, RN, CWS, Kim RN, BSN Signed: 03/02/2022 8:24:32 AM By: Massie Kluver Entered By: Massie Kluver on 02/28/2022 11:54:56  Gene Brown, Gene Brown ([id number]) -------------------------------------------------------------------------------- Vitals Details Patient Name: Gene Brown Date of Service:  02/28/2022 11:15 AM Medical Record Number: [id number] Patient Account Number: [id number] Date of Birth/Sex: Oct 13, 1950 (71 y.o. M) Treating RN: Cornell Barman Primary Care Rahim Astorga: Tracie Harrier Other Clinician: Massie Kluver Referring Analiyah Lechuga: Tracie Harrier Treating Ranika Mcniel/Extender: Tito Dine in Treatment: 7 Vital Signs Time Taken: 11:43 Temperature (F): 98.2 Height (in): 68 Pulse (bpm): 86 Weight (lbs): 217 Respiratory Rate (breaths/min): 18 Body Mass Index (BMI): 33 Blood Pressure (mmHg): 158/89 Reference Range: 80 - 120 mg / dl Electronic Signature(s) Signed: 03/02/2022 8:24:32 AM By: Massie Kluver Entered By: Massie Kluver on 02/28/2022 11:45:18

## 2022-03-02 NOTE — Progress Notes (Signed)
RAHSHAWN, REMO ([id number]) Visit Report for 02/28/2022 Debridement Details Patient Name: Gene Brown, Gene Brown Date of Service: 02/28/2022 11:15 AM Medical Record Number: [id number] Patient Account Number: [id number] Date of Birth/Sex: 1950/10/11 (71 y.o. M) Treating RN: Huel Coventry Primary Care Provider: Barbette Reichmann Other Clinician: Betha Loa Referring Provider: Barbette Reichmann Treating Provider/Extender: Altamese [place] in Treatment: 7 Debridement Performed for Wound #2 Left,Plantar Toe Great Assessment: Performed By: Physician Maxwell Caul, MD Debridement Type: Debridement Severity of Tissue Pre Debridement: Fat layer exposed Level of Consciousness (Pre- Awake and Alert procedure): Pre-procedure Verification/Time Out Yes - 12:00 Taken: Start Time: 12:00 Total Area Debrided (L x W): 1.2 (cm) x 1 (cm) = 1.2 (cm) Tissue and other material Callus, Subcutaneous, Skin: Epidermis debrided: Level: Skin/Subcutaneous Tissue Debridement Description: Excisional Instrument: Curette Bleeding: Moderate Hemostasis Achieved: Silver Nitrate End Time: 12:02 Response to Treatment: Procedure was tolerated well Level of Consciousness (Post- Awake and Alert procedure): Post Debridement Measurements of Total Wound Length: (cm) 1.2 Width: (cm) 1 Depth: (cm) 0.4 Volume: (cm) 0.377 Character of Wound/Ulcer Post Debridement: Stable Severity of Tissue Post Debridement: Fat layer exposed Post Procedure Diagnosis Same as Pre-procedure Electronic Signature(s) Signed: 02/28/2022 4:17:57 PM By: Baltazar Najjar MD Signed: 02/28/2022 5:31:53 PM By: Elliot Gurney, BSN, RN, CWS, Kim RN, BSN Signed: 03/02/2022 8:24:32 AM By: Betha Loa Entered By: Betha Loa on 02/28/2022 12:02:57 Gene Brown ([id number]) -------------------------------------------------------------------------------- HPI Details Patient Name: Gene Brown Date of Service: 02/28/2022 11:15 AM Medical  Record Number: [id number] Patient Account Number: [id number] Date of Birth/Sex: 08-04-1951 (71 y.o. M) Treating RN: Huel Coventry Primary Care Provider: Barbette Reichmann Other Clinician: Betha Loa Referring Provider: Barbette Reichmann Treating Provider/Extender: Altamese [place] in Treatment: 7 History of Present Illness HPI Description: 05/24/16; this is a 71 year old type II diabetic who probably has some degree of polyneuropathy. He tells me that he has had an area on the plantar aspect of his left great toe that is been followed by podiatry for most the last year. He has a lot of callus, the podiatrist which shave this down put a dressing on and he would follow pad monthly or sometimes longer intervals. He has not noted any drainage or pain. He has not had a recent x-ray. No serious attempt at offloading this with modified footwear.. Last hemoglobin A1c that he knows about was over 10 a month ago he is working hard on this with his primary physician. More recently he has been soaking this with peroxide ABIs are noncompressible and the left leg. He was told by his podiatrist he has a bone spur underneath the wound but did not wish any surgical procedures. He has a caregiver at home for a very disabled wife, the nature of her illness is not certain 05/31/16 patient comes in much the same as last week amounts of callus nonviable subcutaneous tissue which required debridement. His x-ray suggested cortical bone irregularity. He will need an MRI. He tells me he has old screws/hardware left knee however he thinks he has had a subsequent MRI after this 06/07/16 at this point in time patient has been attempting to offload his wound as much as he could on his own. He has made a conscious effort to avoid placing any pressure on the toe as he was moving around and walking and I do believe this has payed off and what I am seeing at this point in time. overall the wound appears to be somewhat  improved in my opinion he is  not having any significant discomfort at this point in time. In fact he tells me he has no pain at all. There is no interval sign of surrounding infection in the wound itself has actually decreased in size. Unfortunately the MRI was not ordered last week and therefore we do need to proceed with the MRI order today. again this is to evaluate the cortical bone irregularity noted on x-ray. 06/14/16 Patient has continued to use the offloading shoe at this point in time. He tells me this is somewhat difficult to get used to walking and but nonetheless he is very strict about wearing it. Again I believe this has paid off very well for him and his wound appears to be greatly improved. He does have some callused area surrounding the wound proximally that has caused some fluid collection of maceration of the callused area. This likely will require debridement today. Some adherent slough noted over the wound bed. 06/21/16; patient's wound on the plantar aspect of his left great toe is totally epithelialized. His MRI of the foot is tomorrow to follow-up on the cortical irregularity noted on plain x-ray. He does not have diabetic foot where 06-28-16-He comes in today with a continued healed left great toe. MRI performed on 06/23/2016 shows no abscess osteomyelitis or septic joint. Patient was discharged from wound care center today. Readmission: 01-07-2022 upon evaluation today patient appears to be doing somewhat poorly in regard to total ulcer on his left foot. Fortunately there does not appear to be any signs of infection which is great news. Has been seen by Dr. Excell Seltzer and he did request from Dr. Excell Seltzer to come see [place] due to the fact that in the past several years back we were able to get one of his wounds healed about 4 weeks. Again I explained that that is always variable but nonetheless I definitely think we can help. Fortunately I see no evidence of infection. He does have a foot  offloading shoe which I think is good to attempt for now he was able to get this healed before with a peg assist. Patient does have a history of type 2 diabetes, diabetic polyneuropathy, and hypertension. This current wound started on 12-09-2021. 01-20-2022 upon evaluation today patient appears to be doing poorly in regard to his great toe. I am actually very concerned based on what I am seeing here at this point about how things are progressing. I do not believe there is any evidence of infection systemically that is obvious but locally I am concerned about the possibility this could even be an osteomyelitis type situation. 01-28-2022 upon evaluation today patient appears to be doing well with regard to his toe ulcer compared to what we were previous. Fortunately I do not see any signs of active infection at this time which is great news. No fevers, chills, nausea, vomiting, or diarrhea. With that being said the patient has not started his Augmentin yet which I definitely think he needs to get on board ASAP. I do see some signs of new skin growth I think he is using the front offloading shoe which is good for the time being. 02-04-2022 upon evaluation today patient appears to be doing okay in regard to his toe ulcer this is actually measuring a little bit smaller but still has a bit of callus buildup but not as much as we have seen in the past. Fortunately I do not see any evidence of active infection locally or systemically which is great news. No fevers,  chills, nausea, vomiting, or diarrhea. Marland Kitchen 02-14-2022 upon evaluation today patient's wound is actually showing signs of good improvement which is great news. Fortunately I do not see any evidence of active infection locally or systemically which is great news as well. No fevers, chills, nausea, vomiting, or diarrhea. 02-21-2022 upon evaluation today patient's toe ulcer actually is showing signs of excellent improvement. I do not see any evidence of  infection locally or systemically which is great news and overall I am extremely pleased with where we stand today. I do not see any signs of active infection locally or systemically which is great news. No fevers, chills, nausea, vomiting, or diarrhea. 7/24; plantar left great toe no real improvement since last week. Using silver alginate gauze. He has a forefoot offloading boot. He also has lymphedema and uses his compression pumps I do not think this is adding any particular difficulties. It sounds as though he is still pretty active Electronic Signature(s) Signed: 02/28/2022 4:17:57 PM By: Baltazar Najjar MD Entered By: Baltazar Najjar on 02/28/2022 12:18:49 Gene Brown ([id number]) Gene Brown, Gene Brown ([id number]) -------------------------------------------------------------------------------- Physical Exam Details Patient Name: Gene Brown Date of Service: 02/28/2022 11:15 AM Medical Record Number: [id number] Patient Account Number: [id number] Date of Birth/Sex: [birth year] (71 y.o. M) Treating RN: Huel Coventry Primary Care Provider: Barbette Reichmann Other Clinician: Betha Loa Referring Provider: Barbette Reichmann Treating Provider/Extender: Altamese [place] in Treatment: 7 Constitutional Patient is hypertensive.. Pulse regular and within target range for patient.Marland Kitchen Respirations regular, non-labored and within target range.. Temperature is normal and within the target range for the patient.Marland Kitchen appears in no distress. Notes Wound exam; plantar left great toe punched-out wound. This does not probe to bone medially slightly more depth. The surface is fibrinous thick rolled skin around the medial edge. I used a #5 curette to remove skin and callus from the wound edge and fibrinous debris from the wound surface hemostasis with silver nitrate and direct pressure. There is no evidence of infection and as mentioned the wound does not probe to bone or anywhere close to  this Electronic Signature(s) Signed: 02/28/2022 4:17:57 PM By: Baltazar Najjar MD Entered By: Baltazar Najjar on 02/28/2022 12:19:55 Gene Brown ([id number]) -------------------------------------------------------------------------------- Physician Orders Details Patient Name: Gene Brown Date of Service: 02/28/2022 11:15 AM Medical Record Number: [id number] Patient Account Number: [id number] Date of Birth/Sex: [birth year] (70 y.o. M) Treating RN: Huel Coventry Primary Care Provider: Barbette Reichmann Other Clinician: Betha Loa Referring Provider: Barbette Reichmann Treating Provider/Extender: Altamese [place] in Treatment: 7 Verbal / Phone Orders: No Diagnosis Coding Follow-up Appointments Wound #2 Left,Plantar Toe Great o Return Appointment in 1 week. o Nurse Visit as needed Bathing/ Shower/ Hygiene o May shower; gently cleanse wound with antibacterial soap, rinse and pat dry prior to dressing wounds o No tub bath. Anesthetic (Use 'Patient Medications' Section for Anesthetic Order Entry) o Lidocaine applied to wound bed Edema Control - Lymphedema / Segmental Compressive Device / Other o Compression Pump: Use compression pump on left lower extremity for 60 minutes, twice daily. o Compression Pump: Use compression pump on right lower extremity for 60 minutes, twice daily. o DO YOUR BEST to sleep in the bed at night. DO NOT sleep in your recliner. Long hours of sitting in a recliner leads to swelling of the legs and/or potential wounds on your backside. Off-Loading o Offloading felt to foot. - donut cut in offloading felt o Other: - front off-loader left Additional Orders / Instructions o Follow Nutritious Diet  and Increase Protein Intake o Activity as tolerated Wound Treatment Wound #2 - Toe Great Wound Laterality: Plantar, Left Primary Dressing: Silvercel Small 2x2 (in/in) 1 x Per Day/15 Days Discharge Instructions: Apply Silvercel Small  2x2 (in/in) as instructed Secondary Dressing: Conforming Guaze Roll-Small 1 x Per Day/15 Days Discharge Instructions: Apply Conforming Stretch Guaze Bandage as directed Secured With: Medipore Tape - 35M Medipore H Soft Cloth Surgical Tape, 2x2 (in/yd) 1 x Per Day/15 Days Electronic Signature(s) Signed: 02/28/2022 4:17:57 PM By: Baltazar Najjar MD Signed: 03/02/2022 8:24:32 AM By: Betha Loa Entered By: Betha Loa on 02/28/2022 12:03:36 Gene Brown ([id number]) -------------------------------------------------------------------------------- Problem List Details Patient Name: Gene Brown Date of Service: 02/28/2022 11:15 AM Medical Record Number: [id number] Patient Account Number: [id number] Date of Birth/Sex: [birth year] (71 y.o. M) Treating RN: Huel Coventry Primary Care Provider: Barbette Reichmann Other Clinician: Betha Loa Referring Provider: Barbette Reichmann Treating Provider/Extender: Altamese [place] in Treatment: 7 Active Problems ICD-10 Encounter Code Description Active Date MDM Diagnosis E11.621 Type 2 diabetes mellitus with foot ulcer 01/07/2022 No Yes L97.522 Non-pressure chronic ulcer of other part of left foot with fat layer 01/07/2022 No Yes exposed E11.42 Type 2 diabetes mellitus with diabetic polyneuropathy 01/07/2022 No Yes I10 Essential (primary) hypertension 01/07/2022 No Yes Inactive Problems Resolved Problems Electronic Signature(s) Signed: 02/28/2022 4:17:57 PM By: Baltazar Najjar MD Entered By: Baltazar Najjar on 02/28/2022 12:17:19 Gene Brown ([id number]) -------------------------------------------------------------------------------- Progress Note Details Patient Name: Gene Brown Date of Service: 02/28/2022 11:15 AM Medical Record Number: [id number] Patient Account Number: [id number] Date of Birth/Sex: 07/25/1951 (71 y.o. M) Treating RN: Huel Coventry Primary Care Provider: Barbette Reichmann Other Clinician: Betha Loa Referring Provider: Barbette Reichmann Treating Provider/Extender: Altamese [place] in Treatment: 7 Subjective History of Present Illness (HPI) 05/24/16; this is a 71 year old type II diabetic who probably has some degree of polyneuropathy. He tells me that he has had an area on the plantar aspect of his left great toe that is been followed by podiatry for most the last year. He has a lot of callus, the podiatrist which shave this down put a dressing on and he would follow pad monthly or sometimes longer intervals. He has not noted any drainage or pain. He has not had a recent x-ray. No serious attempt at offloading this with modified footwear.. Last hemoglobin A1c that he knows about was over 10 a month ago he is working hard on this with his primary physician. More recently he has been soaking this with peroxide ABIs are noncompressible and the left leg. He was told by his podiatrist he has a bone spur underneath the wound but did not wish any surgical procedures. He has a caregiver at home for a very disabled wife, the nature of her illness is not certain 05/31/16 patient comes in much the same as last week amounts of callus nonviable subcutaneous tissue which required debridement. His x-ray suggested cortical bone irregularity. He will need an MRI. He tells me he has old screws/hardware left knee however he thinks he has had a subsequent MRI after this 06/07/16 at this point in time patient has been attempting to offload his wound as much as he could on his own. He has made a conscious effort to avoid placing any pressure on the toe as he was moving around and walking and I do believe this has payed off and what I am seeing at this point in time. overall the wound appears to be somewhat improved in my  opinion he is not having any significant discomfort at this point in time. In fact he tells me he has no pain at all. There is no interval sign of surrounding infection in the wound  itself has actually decreased in size. Unfortunately the MRI was not ordered last week and therefore we do need to proceed with the MRI order today. again this is to evaluate the cortical bone irregularity noted on x-ray. 06/14/16 Patient has continued to use the offloading shoe at this point in time. He tells me this is somewhat difficult to get used to walking and but nonetheless he is very strict about wearing it. Again I believe this has paid off very well for him and his wound appears to be greatly improved. He does have some callused area surrounding the wound proximally that has caused some fluid collection of maceration of the callused area. This likely will require debridement today. Some adherent slough noted over the wound bed. 06/21/16; patient's wound on the plantar aspect of his left great toe is totally epithelialized. His MRI of the foot is tomorrow to follow-up on the cortical irregularity noted on plain x-ray. He does not have diabetic foot where 06-28-16-He comes in today with a continued healed left great toe. MRI performed on 06/23/2016 shows no abscess osteomyelitis or septic joint. Patient was discharged from wound care center today. Readmission: 01-07-2022 upon evaluation today patient appears to be doing somewhat poorly in regard to total ulcer on his left foot. Fortunately there does not appear to be any signs of infection which is great news. Has been seen by Dr. Excell SeltzerBaker and he did request from Dr. Excell SeltzerBaker to come see us due to the fact that in the past several years back we were able to get one of his wounds healed about 4 weeks. Again I explained that that is always variable but nonetheless I definitely think we can help. Fortunately I see no evidence of infection. He does have a foot offloading shoe which I think is good to attempt for now he was able to get this healed before with a peg assist. Patient does have a history of type 2 diabetes, diabetic polyneuropathy, and  hypertension. This current wound started on 12-09-2021. 01-20-2022 upon evaluation today patient appears to be doing poorly in regard to his great toe. I am actually very concerned based on what I am seeing here at this point about how things are progressing. I do not believe there is any evidence of infection systemically that is obvious but locally I am concerned about the possibility this could even be an osteomyelitis type situation. 01-28-2022 upon evaluation today patient appears to be doing well with regard to his toe ulcer compared to what we were previous. Fortunately I do not see any signs of active infection at this time which is great news. No fevers, chills, nausea, vomiting, or diarrhea. With that being said the patient has not started his Augmentin yet which I definitely think he needs to get on board ASAP. I do see some signs of new skin growth I think he is using the front offloading shoe which is good for the time being. 02-04-2022 upon evaluation today patient appears to be doing okay in regard to his toe ulcer this is actually measuring a little bit smaller but still has a bit of callus buildup but not as much as we have seen in the past. Fortunately I do not see any evidence of active infection locally or systemically which is  great news. No fevers, chills, nausea, vomiting, or diarrhea. Marland Kitchen 02-14-2022 upon evaluation today patient's wound is actually showing signs of good improvement which is great news. Fortunately I do not see any evidence of active infection locally or systemically which is great news as well. No fevers, chills, nausea, vomiting, or diarrhea. 02-21-2022 upon evaluation today patient's toe ulcer actually is showing signs of excellent improvement. I do not see any evidence of infection locally or systemically which is great news and overall I am extremely pleased with where we stand today. I do not see any signs of active infection locally or systemically which is great  news. No fevers, chills, nausea, vomiting, or diarrhea. 7/24; plantar left great toe no real improvement since last week. Using silver alginate gauze. He has a forefoot offloading boot. He also has lymphedema and uses his compression pumps I do not think this is adding any particular difficulties. It sounds as though he is still pretty active Gene Brown, Gene Brown ([id number]) Objective Constitutional Patient is hypertensive.. Pulse regular and within target range for patient.Marland Kitchen Respirations regular, non-labored and within target range.. Temperature is normal and within the target range for the patient.Marland Kitchen appears in no distress. Vitals Time Taken: 11:43 AM, Height: 68 in, Weight: 217 lbs, BMI: 33, Temperature: 98.2 F, Pulse: 86 bpm, Respiratory Rate: 18 breaths/min, Blood Pressure: 158/89 mmHg. General Notes: Wound exam; plantar left great toe punched-out wound. This does not probe to bone medially slightly more depth. The surface is fibrinous thick rolled skin around the medial edge. I used a #5 curette to remove skin and callus from the wound edge and fibrinous debris from the wound surface hemostasis with silver nitrate and direct pressure. There is no evidence of infection and as mentioned the wound does not probe to bone or anywhere close to this Integumentary (Hair, Skin) Wound #2 status is Open. Original cause of wound was Gradually Appeared. The date acquired was: 12/09/2021. The wound has been in treatment 7 weeks. The wound is located on the [employer]. The wound measures 1.2cm length x 1cm width x 0.3cm depth; 0.942cm^2 area and 0.283cm^3 volume. There is Fat Layer (Subcutaneous Tissue) exposed. There is a medium amount of serous drainage noted. The wound margin is flat and intact. There is large (67-100%) pink, pale, hyper - granulation within the wound bed. There is a small (1-33%) amount of necrotic tissue within the wound bed including Adherent Slough. Assessment Active  Problems ICD-10 Type 2 diabetes mellitus with foot ulcer Non-pressure chronic ulcer of other part of left foot with fat layer exposed Type 2 diabetes mellitus with diabetic polyneuropathy Essential (primary) hypertension Procedures Wound #2 Pre-procedure diagnosis of Wound #2 is a Diabetic Wound/Ulcer of the Lower Extremity located on the Left,Plantar Toe Great .Severity of Tissue Pre Debridement is: Fat layer exposed. There was a Excisional Skin/Subcutaneous Tissue Debridement with a total area of 1.2 sq cm performed by Maxwell Caul, MD. With the following instrument(s): Curette Material removed includes Callus, Subcutaneous Tissue, and Skin: Epidermis. A time out was conducted at 12:00, prior to the start of the procedure. A Moderate amount of bleeding was controlled with Silver Nitrate. The procedure was tolerated well. Post Debridement Measurements: 1.2cm length x 1cm width x 0.4cm depth; 0.377cm^3 volume. Character of Wound/Ulcer Post Debridement is stable. Severity of Tissue Post Debridement is: Fat layer exposed. Post procedure Diagnosis Wound #2: Same as Pre-Procedure Plan Follow-up Appointments: Wound #2 Left,Plantar Toe Great: Return Appointment in 1 week. Nurse Visit as  needed Bathing/ Shower/ Hygiene: May shower; gently cleanse wound with antibacterial soap, rinse and pat dry prior to dressing wounds No tub bath. Anesthetic (Use 'Patient Medications' Section for Anesthetic Order Entry): Lidocaine applied to wound bed Edema Control - Lymphedema / Segmental Compressive Device / Other: Compression Pump: Use compression pump on left lower extremity for 60 minutes, twice daily. Compression Pump: Use compression pump on right lower extremity for 60 minutes, twice daily. Gene Brown, Gene Brown ([id number]) DO YOUR BEST to sleep in the bed at night. DO NOT sleep in your recliner. Long hours of sitting in a recliner leads to swelling of the legs and/or potential wounds on your  backside. Off-Loading: Offloading felt to foot. - donut cut in offloading felt Other: - front off-loader left Additional Orders / Instructions: Follow Nutritious Diet and Increase Protein Intake Activity as tolerated WOUND #2: - Toe Great Wound Laterality: Plantar, Left Primary Dressing: Silvercel Small 2x2 (in/in) 1 x Per Day/15 Days Discharge Instructions: Apply Silvercel Small 2x2 (in/in) as instructed Secondary Dressing: Conforming Guaze Roll-Small 1 x Per Day/15 Days Discharge Instructions: Apply Conforming Stretch Guaze Bandage as directed Secured With: Medipore Tape - 73M Medipore H Soft Cloth Surgical Tape, 2x2 (in/yd) 1 x Per Day/15 Days 1. Debridement. Surface of this very gritty and fibrinous. Rolled edges of thick skin and callus on the edge medially especially. 2. For now I continue with the same dressing and offloading. If this does not progress he is likely to require more aggressive offloadingo TCC Electronic Signature(s) Signed: 02/28/2022 4:17:57 PM By: Baltazar Najjar MD Entered By: Baltazar Najjar on 02/28/2022 12:22:21 Gene Brown ([id number]) -------------------------------------------------------------------------------- SuperBill Details Patient Name: Gene Brown Date of Service: 02/28/2022 Medical Record Number: [id number] Patient Account Number: [id number] Date of Birth/Sex: [birth year] (71 y.o. 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 I10 Essential (primary) hypertension Facility Procedures CPT4 Code: 28003491 Description: 11042 - DEB SUBQ TISSUE  20 SQ CM/< Modifier: Quantity: 1 CPT4 Code: Description: ICD-10 Diagnosis Description 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 E11.621 Type 2 diabetes mellitus with foot ulcer Modifier: osed Quantity: Physician Procedures CPT4 Code: 7915056 Description: 11042 - WC PHYS SUBQ TISS 20 SQ CM Modifier: Quantity: 1 CPT4 Code: Description: ICD-10 Diagnosis Description 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 E11.621 Type 2 diabetes mellitus with foot ulcer Modifier: osed Quantity: Electronic Signature(s) Signed: 02/28/2022 4:17:57 PM By: Baltazar Najjar MD Entered By: Baltazar Najjar on 02/28/2022 12:22:39

## 2022-03-07 ENCOUNTER — Encounter: Payer: Medicare Other | Admitting: Physician Assistant

## 2022-03-07 DIAGNOSIS — E11621 Type 2 diabetes mellitus with foot ulcer: Secondary | ICD-10-CM | POA: Diagnosis not present

## 2022-03-07 NOTE — Progress Notes (Signed)
AODHAN, SCHEIDT ([id number]) Visit Report for 03/07/2022 Chief Complaint Document Details Patient Name: Gene Brown, Gene Brown Date of Service: 03/07/2022 11:15 AM Medical Record Number: [id number] Patient Account Number: [id number] Date of Birth/Sex: 1951/04/20 (71 y.o. M) Treating RN: Huel Coventry Primary Care Provider: Barbette Reichmann Other Clinician: Betha Loa Referring Provider: Barbette Reichmann Treating Provider/Extender: Rowan Blase in Treatment: 8 Information Obtained from: Patient Chief Complaint Left great toe ulcer Electronic Signature(s) Signed: 03/07/2022 11:50:29 AM By: Lenda Kelp PA-C Entered By: Lenda Kelp on 03/07/2022 11:50:29 [name] ([id number]) -------------------------------------------------------------------------------- Problem List Details Patient Name: [name] Date of Service: 03/07/2022 11:15 AM Medical Record Number: [id number] Patient Account Number: [id number] Date of Birth/Sex: 05/16/1951 (71 y.o. M) Treating RN: Huel Coventry Primary Care Provider: Barbette Reichmann Other Clinician: Betha Loa Referring Provider: Barbette Reichmann Treating Provider/Extender: Rowan Blase in Treatment: 8 Active Problems ICD-10 Encounter Code Description Active Date MDM Diagnosis E11.621 Type 2 diabetes mellitus with foot ulcer 01/07/2022 No Yes L97.522 Non-pressure chronic ulcer of other part of left foot with fat layer 01/07/2022 No Yes exposed E11.42 Type 2 diabetes mellitus with diabetic polyneuropathy 01/07/2022 No Yes I10 Essential (primary) hypertension 01/07/2022 No Yes Inactive Problems Resolved Problems Electronic Signature(s) Signed: 03/07/2022 11:50:25 AM By: Lenda Kelp PA-C Entered By: Lenda Kelp on 03/07/2022 11:50:24

## 2022-03-08 NOTE — Progress Notes (Signed)
Gene Brown, Gene Brown ([id number]) Visit Report for 03/07/2022 Arrival Information Details Patient Name: Gene Brown, Gene Brown Date of Service: 03/07/2022 11:15 AM Medical Record Number: [id number] Patient Account Number: [id number] Date of Birth/Sex: February 21, 1951 (71 y.o. M) Treating RN: Gene Brown Primary Care Gene Brown: Gene Brown Gene Clinician: Massie Brown Referring Gene Brown: Gene Brown Treating Gene Brown/Extender: Gene Brown in Treatment: 8 Visit Information History Since Last Visit All ordered tests and consults were completed: No Patient Arrived: Gene Brown Added or deleted any medications: No Arrival Time: 11:41 Any new allergies or adverse reactions: No Transfer Assistance: None Had a fall or experienced change in No Patient Has Alerts: Yes activities of daily living that may affect Patient Alerts: Patient on Blood Thinner risk of falls: 01/07/2022 [place]>220 on left Hospitalized since last visit: No Pain Present Now: No Electronic Signature(s) Signed: 03/08/2022 3:08:50 PM By: Gene Brown Entered By: Gene Brown on 03/07/2022 11:42:23 Gene Brown ([id number]) -------------------------------------------------------------------------------- Clinic Level of Care Assessment Details Patient Name: Gene Brown Date of Service: 03/07/2022 11:15 AM Medical Record Number: [id number] Patient Account Number: [id number] Date of Birth/Sex: [birth year] (71 y.o. M) Treating RN: Gene Brown Primary Care Breck Hollinger: Gene Brown Gene Clinician: Massie Brown Referring Wanetta Funderburke: Gene Brown Treating Clance Baquero/Extender: Gene Brown in Treatment: 8 Clinic Level of Care Assessment Items TOOL 1 Quantity Score _0  - Use when EandM and Procedure is performed on INITIAL visit 0 ASSESSMENTS - Nursing Assessment / Reassessment _1  - General Physical Exam (combine w/ comprehensive assessment (listed just below) when performed on new 0 pt. evals) _2  -  0 Comprehensive Assessment (HX, ROS, Risk Assessments, Wounds Hx, etc.) ASSESSMENTS - Wound and Skin Assessment / Reassessment _3  - Dermatologic / Skin Assessment (not related to wound area) 0 ASSESSMENTS - Ostomy and/or Continence Assessment and Care _4  - Incontinence Assessment and Management 0 _5  - 0 Ostomy Care Assessment and Management (repouching, etc.) PROCESS - Coordination of Care _6  - Simple Patient / Family Education for ongoing care 0 _7  - 0 Complex (extensive) Patient / Family Education for ongoing care _8  - 0 Staff obtains Programmer, systems, Records, Test Results / Process Orders _9  - 0 Staff telephones HHA, Nursing Homes / Clarify orders / etc _10  - 0 Routine Transfer to another Facility (non-emergent condition) _11  - 0 Routine Hospital Admission (non-emergent condition) _12  - 0 New Admissions / Biomedical engineer / Ordering NPWT, Apligraf, etc. _13  - 0 Emergency Hospital Admission (emergent condition) PROCESS - Special Needs _14  - Pediatric / Minor Patient Management 0 _15  - 0 Isolation Patient Management _16  - 0 Hearing / Language / Visual special needs _17  - 0 Assessment of Community assistance (transportation, D/C planning, etc.) _18  - 0 Additional assistance / Altered mentation _19  - 0 Support Surface(s) Assessment (bed, cushion, seat, etc.) INTERVENTIONS - Miscellaneous _20  - External ear exam 0 _21  - 0 Patient Transfer (multiple staff / Civil Service fast streamer / Similar devices) _22  - 0 Simple Staple / Suture removal (25 or less) _23  - 0 Complex Staple / Suture removal (26 or more) _24  - 0 Hypo/Hyperglycemic Management (do not check if billed separately) _25  - 0 Ankle / Brachial Index (ABI) - do not check if billed separately Has the patient been seen at the hospital within the last three years: Yes Total Score: 0 Level Of Care: ____ Gene Brown ([id number]) Electronic Signature(s) Signed: 03/08/2022 3:08:50 PM By: Gene Brown Entered By: Gene Brown on 03/07/2022  12:17:29 Gene Brown ([id number]) -------------------------------------------------------------------------------- Encounter Discharge Information Details Patient Name: Gene Brown Date of  Service: 03/07/2022 11:15 AM Medical Record Number: [id number] Patient Account Number: [id number] Date of Birth/Sex: 01/05/1951 (71 y.o. M) Treating RN: Gene Brown Primary Care Jolayne Branson: Gene Brown Gene Clinician: Massie Brown Referring Gloria Ricardo: Gene Brown Treating Kanda Deluna/Extender: Gene Brown in Treatment: 8 Encounter Discharge Information Items Post Procedure Vitals Discharge Condition: Stable Temperature (F): 97.6 Ambulatory Status: Cane Pulse (bpm): 69 Discharge Destination: Home Respiratory Rate (breaths/min): 18 Transportation: Private Auto Blood Pressure (mmHg): 157/82 Accompanied By: self Schedule Follow-up Appointment: Yes Clinical Summary of Care: Electronic Signature(s) Signed: 03/08/2022 3:08:50 PM By: Gene Brown Entered By: Gene Brown on 03/07/2022 12:19:30 Gene Brown ([id number]) -------------------------------------------------------------------------------- Lower Extremity Assessment Details Patient Name: Gene Brown Date of Service: 03/07/2022 11:15 AM Medical Record Number: [id number] Patient Account Number: [id number] Date of Birth/Sex: January 22, 1951 (71 y.o. M) Treating RN: Gene Brown Primary Care Dorothye Berni: Gene Brown Gene Clinician: Massie Brown Referring Emran Molzahn: Gene Brown Treating Lashaun Poch/Extender: Gene Brown in Treatment: 8 Edema Assessment Assessed: [Left: Yes] [Right: Yes] Edema: [Left: Ye] [Right: s] Calf Left: Right: Point of Measurement: 28 cm From Medial Instep 43.2 cm Ankle Left: Right: Point of Measurement: 11 cm From Medial Instep 30.3 cm Vascular Assessment Pulses: Dorsalis Pedis Palpable: [Left:Yes] Electronic Signature(s) Signed: 03/07/2022 5:09:07 PM By: [name], BSN, RN,  CWS, Kim RN, BSN Signed: 03/08/2022 3:08:50 PM By: Gene Brown Entered By: Gene Brown on 03/07/2022 11:56:26 Gene Brown ([id number]) -------------------------------------------------------------------------------- Multi Wound Chart Details Patient Name: Gene Brown Date of Service: 03/07/2022 11:15 AM Medical Record Number: [id number] Patient Account Number: [id number] Date of Birth/Sex: 24-Sep-1950 (71 y.o. M) Treating RN: Gene Brown Primary Care Azir Muzyka: Gene Brown Gene Clinician: Massie Brown Referring Kaeleen Odom: Gene Brown Treating Mischa Pollard/Extender: Gene Brown in Treatment: 8 Vital Signs Height(in): 68 Pulse(bpm): 55 Weight(lbs): 217 Blood Pressure(mmHg): 157/82 Body Mass Index(BMI): 33 Temperature(F): 97.6 Respiratory Rate(breaths/min): 18 Photos: [N/A:N/A] Wound Location: Left, Plantar Toe Great N/A N/A Wounding Event: Gradually Appeared N/A N/A Primary Etiology: Diabetic Wound/Ulcer of the Lower N/A N/A Extremity Comorbid History: Cataracts, Sickle Cell Disease, Sleep N/A N/A Apnea, Hypertension, Type II Diabetes, Gout, Osteoarthritis Date Acquired: 12/09/2021 N/A N/A Weeks of Treatment: 8 N/A N/A Wound Status: Open N/A N/A Wound Recurrence: No N/A N/A Measurements L x W x D (cm) 1.4x1x0.3 N/A N/A Area (cm) : 1.1 N/A N/A Volume (cm) : 0.33 N/A N/A % Reduction in Area: -288.70% N/A N/A % Reduction in Volume: -134.00% N/A N/A Classification: Grade 2 N/A N/A Exudate Amount: Medium N/A N/A Exudate Type: Serous N/A N/A Exudate Color: amber N/A N/A Wound Margin: Flat and Intact N/A N/A Granulation Amount: Large (67-100%) N/A N/A Granulation Quality: Pink, Pale, Hyper-granulation N/A N/A Necrotic Amount: Small (1-33%) N/A N/A Exposed Structures: Fat Layer (Subcutaneous Tissue): N/A N/A Yes Fascia: No Tendon: No Muscle: No Joint: No Bone: No Epithelialization: None N/A N/A Treatment Notes Electronic Signature(s) Signed:  03/08/2022 3:08:50 PM By: Gene Brown Entered By: Gene Brown on 03/07/2022 11:56:49 Gene Brown ([id number] Brown ([id number]) -------------------------------------------------------------------------------- Multi-Disciplinary Care Plan Details Patient Name: Gene Brown Date of Service: 03/07/2022 11:15 AM Medical Record Number: [id number] Patient Account Number: [id number] Date of Birth/Sex: 1950-10-14 (71 y.o. M) Treating RN: Gene Brown Primary Care Zaleah Ternes: Gene Brown Gene Clinician: Massie Brown Referring [place]: Gene Brown Treating Lennell Shanks/Extender: Gene Brown in Treatment: 8 Active Inactive Wound/Skin Impairment Nursing Diagnoses: Impaired tissue integrity Knowledge deficit related to smoking impact on wound healing Goals: Patient/caregiver will verbalize understanding of skin care regimen Date Initiated: 01/07/2022 Date  Inactivated: 02/21/2022 Target Resolution Date: 01/07/2022 Goal Status: Met Ulcer/skin breakdown will have a volume reduction of 30% by week 4 Date Initiated: 01/07/2022 Target Resolution Date: 02/04/2022 Goal Status: Active Ulcer/skin breakdown will have a volume reduction of 50% by week 8 Date Initiated: 01/07/2022 Target Resolution Date: 03/03/2022 Goal Status: Active Interventions: Assess patient/caregiver ability to obtain necessary supplies Assess patient/caregiver ability to perform ulcer/skin care regimen upon admission and as needed Assess ulceration(s) every visit Treatment Activities: Referred to DME Sabastian Raimondi for dressing supplies : 01/07/2022 Skin care regimen initiated : 01/07/2022 Topical wound management initiated : 01/07/2022 Notes: Electronic Signature(s) Signed: 03/07/2022 5:09:07 PM By: [name], BSN, RN, CWS, Kim RN, BSN Signed: 03/08/2022 3:08:50 PM By: Gene Brown Entered By: Gene Brown on 03/07/2022 11:56:33 Gene Brown  ([id number]) -------------------------------------------------------------------------------- Pain Assessment Details Patient Name: Gene Brown Date of Service: 03/07/2022 11:15 AM Medical Record Number: [id number] Patient Account Number: [id number] Date of Birth/Sex: May 23, 1951 (71 y.o. M) Treating RN: Gene Brown Primary Care Regina Coppolino: Gene Brown Gene Clinician: Massie Brown Referring Danzel Marszalek: Gene Brown Treating Kioni Stahl/Extender: Gene Brown in Treatment: 8 Active Problems Location of Pain Severity and Description of Pain Patient Has Paino No Site Locations Pain Management and Medication Current Pain Management: Electronic Signature(s) Signed: 03/07/2022 5:09:07 PM By: [name], BSN, RN, CWS, Kim RN, BSN Signed: 03/08/2022 3:08:50 PM By: Gene Brown Entered By: Gene Brown on 03/07/2022 11:44:59 Gene Brown ([id number]) -------------------------------------------------------------------------------- Patient/Caregiver Education Details Patient Name: Gene Brown Date of Service: 03/07/2022 11:15 AM Medical Record Number: [id number] Patient Account Number: [id number] Date of Birth/Gender: [birth year] (71 y.o. M) Treating RN: Gene Brown Primary Care Physician: Gene Brown Gene Clinician: Massie Brown Referring Physician: Tracie Brown Treating Physician/Extender: Gene Brown in Treatment: 8 Education Assessment Education Provided To: Patient Education Topics Provided Venous: Handouts: Controlling Swelling with Compression Stockings Methods: Explain/Verbal Responses: State content correctly Wound/Skin Impairment: Handouts: Gene: continue wound care as directed Methods: Explain/Verbal Responses: State content correctly Electronic Signature(s) Signed: 03/08/2022 3:08:50 PM By: Gene Brown Entered By: Gene Brown on 03/07/2022 12:18:36 Gene Brown  ([id number]) -------------------------------------------------------------------------------- Wound Assessment Details Patient Name: Gene Brown Date of Service: 03/07/2022 11:15 AM Medical Record Number: [id number] Patient Account Number: [id number] Date of Birth/Sex: [birth year] (71 y.o. M) Treating RN: Gene Brown Primary Care Kimie Pidcock: Gene Brown Gene Clinician: Massie Brown Referring Adryel Wortmann: Gene Brown Treating Olivier Frayre/Extender: Gene Brown in Treatment: 8 Wound Status Wound Number: 2 Primary Diabetic Wound/Ulcer of the Lower Extremity Etiology: Wound Location: Left, Plantar Toe Great Wound Open Wounding Event: Gradually Appeared Status: Date Acquired: 12/09/2021 Comorbid Cataracts, Sickle Cell Disease, Sleep Apnea, Weeks Of Treatment: 8 History: Hypertension, Type II Diabetes, Gout, Osteoarthritis Clustered Wound: No Photos Wound Measurements Length: (cm) 1.4 Width: (cm) 1 Depth: (cm) 0.3 Area: (cm) 1.1 Volume: (cm) 0.33 % Reduction in Area: -288.7% % Reduction in Volume: -134% Epithelialization: None Wound Description Classification: Grade 2 Wound Margin: Flat and Intact Exudate Amount: Medium Exudate Type: Serous Exudate Color: amber Foul Odor After Cleansing: No Slough/Fibrino Yes Wound Bed Granulation Amount: Large (67-100%) Exposed Structure Granulation Quality: Pink, Pale, Hyper-granulation Fascia Exposed: No Necrotic Amount: Small (1-33%) Fat Layer (Subcutaneous Tissue) Exposed: Yes Necrotic Quality: Adherent Slough Tendon Exposed: No Muscle Exposed: No Joint Exposed: No Bone Exposed: No Treatment Notes Wound #2 (Toe Great) Wound Laterality: Plantar, Left Cleanser Peri-Wound Care Topical LEUL, NARRAMORE ([id number]) Primary Dressing [place] Small 2x2 (in/in) Discharge Instruction: Apply [place] Small 2x2 (in/in) as instructed Secondary Dressing [place] Roll-Small Discharge  Instruction: Apply  Conforming Stretch Guaze Bandage as directed Secured With [place]rio H Soft Cloth Surgical Tape, 2x2 (in/yd) Compression Wrap Compression Stockings Add-Ons Electronic Signature(s) Signed: 03/07/2022 5:09:07 PM By: [name], BSN, RN, CWS, Kim RN, BSN Signed: 03/08/2022 3:08:50 PM By: Gene Brown Entered By: Gene Brown on 03/07/2022 11:54:50 Gene Brown ([id number]) -------------------------------------------------------------------------------- Vitals Details Patient Name: Gene Brown Date of Service: 03/07/2022 11:15 AM Medical Record Number: [id number] Patient Account Number: [id number] Date of Birth/Sex: 01-30-1951 (71 y.o. M) Treating RN: Gene Brown Primary Care Shelsey Rieth: Gene Brown Gene Clinician: Massie Brown Referring Rocco Kerkhoff: Gene Brown Treating Braylin Xu/Extender: Gene Brown in Treatment: 8 Vital Signs Time Taken: 11:42 Temperature (F): 97.6 Height (in): 68 Pulse (bpm): 69 Weight (lbs): 217 Respiratory Rate (breaths/min): 18 Body Mass Index (BMI): 33 Blood Pressure (mmHg): 157/82 Reference Range: 80 - 120 mg / dl Electronic Signature(s) Signed: 03/08/2022 3:08:50 PM By: Gene Brown Entered By: Gene Brown on 03/07/2022 11:44:53

## 2022-03-14 ENCOUNTER — Encounter: Payer: Medicare Other | Attending: Physician Assistant | Admitting: Physician Assistant

## 2022-03-14 DIAGNOSIS — L97522 Non-pressure chronic ulcer of other part of left foot with fat layer exposed: Secondary | ICD-10-CM | POA: Insufficient documentation

## 2022-03-14 DIAGNOSIS — E1142 Type 2 diabetes mellitus with diabetic polyneuropathy: Secondary | ICD-10-CM | POA: Insufficient documentation

## 2022-03-14 DIAGNOSIS — I890 Lymphedema, not elsewhere classified: Secondary | ICD-10-CM | POA: Insufficient documentation

## 2022-03-14 DIAGNOSIS — M1990 Unspecified osteoarthritis, unspecified site: Secondary | ICD-10-CM | POA: Insufficient documentation

## 2022-03-14 DIAGNOSIS — E11621 Type 2 diabetes mellitus with foot ulcer: Secondary | ICD-10-CM | POA: Diagnosis not present

## 2022-03-14 DIAGNOSIS — M109 Gout, unspecified: Secondary | ICD-10-CM | POA: Diagnosis not present

## 2022-03-14 NOTE — Progress Notes (Signed)
CARLE, FENECH ([id number]) Visit Report for 03/14/2022 Chief Complaint Document Details Patient Name: [name], [name] Date of Service: 03/14/2022 9:00 AM Medical Record Number: [id number] Patient Account Number: [id number] Date of Birth/Sex: 12-31-1950 (71 y.o. M) Treating RN: Huel Coventry Primary Care Provider: Barbette Reichmann Other Clinician: Betha Loa Referring Provider: Barbette Reichmann Treating Provider/Extender: Rowan Blase in Treatment: 9 Information Obtained from: Patient Chief Complaint Left great toe ulcer Electronic Signature(s) Signed: 03/14/2022 9:21:14 AM By: Lenda Kelp PA-C Entered By: Lenda Kelp on 03/14/2022 09:21:14 Gene Brown ([id number]) -------------------------------------------------------------------------------- Problem List Details Patient Name: Gene Brown Date of Service: 03/14/2022 9:00 AM Medical Record Number: [id number] Patient Account Number: [id number] Date of Birth/Sex: [birth year] (71 y.o. M) Treating RN: Huel Coventry Primary Care Provider: Barbette Reichmann Other Clinician: Betha Loa Referring Provider: Barbette Reichmann Treating Provider/Extender: Rowan Blase in Treatment: 9 Active Problems ICD-10 Encounter Code Description Active Date MDM Diagnosis E11.621 Type 2 diabetes mellitus with foot ulcer 01/07/2022 No Yes L97.522 Non-pressure chronic ulcer of other part of left foot with fat layer 01/07/2022 No Yes exposed E11.42 Type 2 diabetes mellitus with diabetic polyneuropathy 01/07/2022 No Yes I10 Essential (primary) hypertension 01/07/2022 No Yes Inactive Problems Resolved Problems Electronic Signature(s) Signed: 03/14/2022 9:21:12 AM By: Lenda Kelp PA-C Entered By: Lenda Kelp on 03/14/2022 09:21:11

## 2022-03-16 NOTE — Progress Notes (Signed)
ARVEL, OQUINN ([id number]) Visit Report for 03/14/2022 Arrival Information Details Patient Name: Gene Brown, Gene Brown Date of Service: 03/14/2022 9:00 AM Medical Record Number: [id number] Patient Account Number: [id number] Date of Birth/Sex: 1951/07/23 (71 y.o. M) Treating RN: Cornell Barman Primary Care Lyne Khurana: Tracie Harrier Other Clinician: Massie Kluver Referring Adelynne Joerger: Tracie Harrier Treating Lyall Faciane/Extender: Skipper Cliche in Treatment: 9 Visit Information History Since Last Visit All ordered tests and consults were completed: No Patient Arrived: Kasandra Knudsen Added or deleted any medications: No Arrival Time: 09:04 Any new allergies or adverse reactions: No Transfer Assistance: None Had a fall or experienced change in No Patient Has Alerts: Yes activities of daily living that may affect Patient Alerts: Patient on Blood Thinner risk of falls: 01/07/2022 [place]>220 on left Hospitalized since last visit: No Pain Present Now: No Electronic Signature(s) Signed: 03/14/2022 4:15:07 PM By: Massie Kluver Entered By: Massie Kluver on 03/14/2022 09:05:33 Gene Brown ([id number]) -------------------------------------------------------------------------------- Clinic Level of Care Assessment Details Patient Name: Gene Brown Date of Service: 03/14/2022 9:00 AM Medical Record Number: [id number] Patient Account Number: [id number] Date of Birth/Sex: [birth year] (71 y.o. M) Treating RN: Cornell Barman Primary Care Rishith Siddoway: Tracie Harrier Other Clinician: Massie Kluver Referring Shamila Lerch: Tracie Harrier Treating Tayshaun Kroh/Extender: Skipper Cliche in Treatment: 9 Clinic Level of Care Assessment Items TOOL 1 Quantity Score []  - Use when EandM and Procedure is performed on INITIAL visit 0 ASSESSMENTS - Nursing Assessment / Reassessment []  - General Physical Exam (combine w/ comprehensive assessment (listed just below) when performed on new 0 pt. evals) []  - 0 Comprehensive  Assessment (HX, ROS, Risk Assessments, Wounds Hx, etc.) ASSESSMENTS - Wound and Skin Assessment / Reassessment []  - Dermatologic / Skin Assessment (not related to wound area) 0 ASSESSMENTS - Ostomy and/or Continence Assessment and Care []  - Incontinence Assessment and Management 0 []  - 0 Ostomy Care Assessment and Management (repouching, etc.) PROCESS - Coordination of Care []  - Simple Patient / Family Education for ongoing care 0 []  - 0 Complex (extensive) Patient / Family Education for ongoing care []  - 0 Staff obtains Programmer, systems, Records, Test Results / Process Orders []  - 0 Staff telephones HHA, Nursing Homes / Clarify orders / etc []  - 0 Routine Transfer to another Facility (non-emergent condition) []  - 0 Routine Hospital Admission (non-emergent condition) []  - 0 New Admissions / Biomedical engineer / Ordering NPWT, Apligraf, etc. []  - 0 Emergency Hospital Admission (emergent condition) PROCESS - Special Needs []  - Pediatric / Minor Patient Management 0 []  - 0 Isolation Patient Management []  - 0 Hearing / Language / Visual special needs []  - 0 Assessment of Community assistance (transportation, D/C planning, etc.) []  - 0 Additional assistance / Altered mentation []  - 0 Support Surface(s) Assessment (bed, cushion, seat, etc.) INTERVENTIONS - Miscellaneous []  - External ear exam 0 []  - 0 Patient Transfer (multiple staff / Civil Service fast streamer / Similar devices) []  - 0 Simple Staple / Suture removal (25 or less) []  - 0 Complex Staple / Suture removal (26 or more) []  - 0 Hypo/Hyperglycemic Management (do not check if billed separately) []  - 0 Ankle / Brachial Index (ABI) - do not check if billed separately Has the patient been seen at the hospital within the last three years: Yes Total Score: 0 Level Of Care: ____ Gene Brown ([id number]) Electronic Signature(s) Signed: 03/14/2022 4:15:07 PM By: Massie Kluver Entered By: Massie Kluver on 03/14/2022  09:28:32 Gene Brown ([id number]) -------------------------------------------------------------------------------- Encounter Discharge Information Details Patient Name: Gene Brown Date of  Service: 03/14/2022 9:00 AM Medical Record Number: [id number] Patient Account Number: [id number] Date of Birth/Sex: 11/07/1950 (71 y.o. M) Treating RN: Cornell Barman Primary Care Marsha Hillman: Tracie Harrier Other Clinician: Massie Kluver Referring Thoms Barthelemy: Tracie Harrier Treating Shaniya Tashiro/Extender: Skipper Cliche in Treatment: 9 Encounter Discharge Information Items Post Procedure Vitals Discharge Condition: Stable Temperature (F): 97.7 Ambulatory Status: Cane Pulse (bpm): 59 Discharge Destination: Home Respiratory Rate (breaths/min): 18 Transportation: Private Auto Blood Pressure (mmHg): 151/85 Accompanied By: self Schedule Follow-up Appointment: Yes Clinical Summary of Care: Electronic Signature(s) Signed: 03/14/2022 4:15:07 PM By: Massie Kluver Entered By: Massie Kluver on 03/14/2022 09:38:24 Gene Brown ([id number]) -------------------------------------------------------------------------------- Lower Extremity Assessment Details Patient Name: Gene Brown Date of Service: 03/14/2022 9:00 AM Medical Record Number: [id number] Patient Account Number: [id number] Date of Birth/Sex: February 01, 1951 (71 y.o. M) Treating RN: Cornell Barman Primary Care Sabra Sessler: Tracie Harrier Other Clinician: Massie Kluver Referring Tammie Yanda: Tracie Harrier Treating Khalea Ventura/Extender: Skipper Cliche in Treatment: 9 Edema Assessment Assessed: [Left: Yes] [Right: No] Edema: [Left: Ye] [Right: s] Calf Left: Right: Point of Measurement: 28 cm From Medial Instep 43 cm Ankle Left: Right: Point of Measurement: 11 cm From Medial Instep 30.2 cm Vascular Assessment Pulses: Dorsalis Pedis Palpable: [Left:Yes] Electronic Signature(s) Signed: 03/14/2022 4:15:07 PM By: Massie Kluver Signed:  03/16/2022 3:21:50 PM By: [name], BSN, RN, CWS, Kim RN, BSN Entered By: Massie Kluver on 03/14/2022 09:15:26 Gene Brown ([id number]) -------------------------------------------------------------------------------- Multi Wound Chart Details Patient Name: Gene Brown Date of Service: 03/14/2022 9:00 AM Medical Record Number: [id number] Patient Account Number: [id number] Date of Birth/Sex: [birth year] (71 y.o. M) Treating RN: Cornell Barman Primary Care Meelah Tallo: Tracie Harrier Other Clinician: Massie Kluver Referring Kailon Treese: Tracie Harrier Treating Carra Brindley/Extender: Skipper Cliche in Treatment: 9 Vital Signs Height(in): 68 Pulse(bpm): 77 Weight(lbs): 597 Blood Pressure(mmHg): 151/85 Body Mass Index(BMI): 33 Temperature(F): 97.7 Respiratory Rate(breaths/min): 18 Photos: [N/A:N/A] Wound Location: Left, Plantar Toe Great N/A N/A Wounding Event: Gradually Appeared N/A N/A Primary Etiology: Diabetic Wound/Ulcer of the Lower N/A N/A Extremity Comorbid History: Cataracts, Sickle Cell Disease, Sleep N/A N/A Apnea, Hypertension, Type II Diabetes, Gout, Osteoarthritis Date Acquired: 12/09/2021 N/A N/A Weeks of Treatment: 9 N/A N/A Wound Status: Open N/A N/A Wound Recurrence: No N/A N/A Measurements L x W x D (cm) 1x1.1x0.4 N/A N/A Area (cm) : 0.864 N/A N/A Volume (cm) : 0.346 N/A N/A % Reduction in Area: -205.30% N/A N/A % Reduction in Volume: -145.40% N/A N/A Classification: Grade 2 N/A N/A Exudate Amount: Medium N/A N/A Exudate Type: Serous N/A N/A Exudate Color: amber N/A N/A Wound Margin: Flat and Intact N/A N/A Granulation Amount: Large (67-100%) N/A N/A Granulation Quality: Pink, Pale, Hyper-granulation N/A N/A Necrotic Amount: Small (1-33%) N/A N/A Exposed Structures: Fat Layer (Subcutaneous Tissue): N/A N/A Yes Fascia: No Tendon: No Muscle: No Joint: No Bone: No Epithelialization: None N/A N/A Treatment Notes Electronic Signature(s) Signed:  03/14/2022 4:15:07 PM By: Massie Kluver Entered By: Massie Kluver on 03/14/2022 09:15:47 Gene Brown ([id number] Brown ([id number]) -------------------------------------------------------------------------------- Multi-Disciplinary Care Plan Details Patient Name: Gene Brown Date of Service: 03/14/2022 9:00 AM Medical Record Number: [id number] Patient Account Number: [id number] Date of Birth/Sex: Dec 11, 1950 (71 y.o. M) Treating RN: Cornell Barman Primary Care Aylah Yeary: Tracie Harrier Other Clinician: Massie Kluver Referring Broadus Costilla: Tracie Harrier Treating Dupree Givler/Extender: Skipper Cliche in Treatment: 9 Active Inactive Wound/Skin Impairment Nursing Diagnoses: Impaired tissue integrity Knowledge deficit related to smoking impact on wound healing Goals: Patient/caregiver will verbalize understanding of skin care regimen Date Initiated: 01/07/2022 Date  Inactivated: 02/21/2022 Target Resolution Date: 01/07/2022 Goal Status: Met Ulcer/skin breakdown will have a volume reduction of 30% by week 4 Date Initiated: 01/07/2022 Target Resolution Date: 02/04/2022 Goal Status: Active Ulcer/skin breakdown will have a volume reduction of 50% by week 8 Date Initiated: 01/07/2022 Target Resolution Date: 03/03/2022 Goal Status: Active Interventions: Assess patient/caregiver ability to obtain necessary supplies Assess patient/caregiver ability to perform ulcer/skin care regimen upon admission and as needed Assess ulceration(s) every visit Treatment Activities: Referred to DME Charolette Bultman for dressing supplies : 01/07/2022 Skin care regimen initiated : 01/07/2022 Topical wound management initiated : 01/07/2022 Notes: Electronic Signature(s) Signed: 03/14/2022 4:15:07 PM By: Massie Kluver Signed: 03/16/2022 3:21:50 PM By: [name], BSN, RN, CWS, Kim RN, BSN Entered By: Massie Kluver on 03/14/2022 09:15:33 Gene Brown  ([id number]) -------------------------------------------------------------------------------- Pain Assessment Details Patient Name: Gene Brown Date of Service: 03/14/2022 9:00 AM Medical Record Number: [id number] Patient Account Number: [id number] Date of Birth/Sex: 12/29/1950 (71 y.o. M) Treating RN: Cornell Barman Primary Care Kollen Armenti: Tracie Harrier Other Clinician: Massie Kluver Referring Pinchos Topel: Tracie Harrier Treating Thadeus Gandolfi/Extender: Skipper Cliche in Treatment: 9 Active Problems Location of Pain Severity and Description of Pain Patient Has Paino No Site Locations Pain Management and Medication Current Pain Management: Electronic Signature(s) Signed: 03/14/2022 4:15:07 PM By: Massie Kluver Signed: 03/16/2022 3:21:50 PM By: [name], BSN, RN, CWS, Kim RN, BSN Entered By: Massie Kluver on 03/14/2022 09:07:39 Gene Brown ([id number]) -------------------------------------------------------------------------------- Patient/Caregiver Education Details Patient Name: Gene Brown Date of Service: 03/14/2022 9:00 AM Medical Record Number: [id number] Patient Account Number: [id number] Date of Birth/Gender: [birth year] (71 y.o. M) Treating RN: Cornell Barman Primary Care Physician: Tracie Harrier Other Clinician: Massie Kluver Referring Physician: Tracie Harrier Treating Physician/Extender: Skipper Cliche in Treatment: 9 Education Assessment Education Provided To: Patient Education Topics Provided Wound/Skin Impairment: Handouts: Other: continue wound care as directed Methods: Explain/Verbal Responses: State content correctly Electronic Signature(s) Signed: 03/14/2022 4:15:07 PM By: Massie Kluver Entered By: Massie Kluver on 03/14/2022 09:28:58 Gene Brown ([id number]) -------------------------------------------------------------------------------- Wound Assessment Details Patient Name: Gene Brown Date of Service: 03/14/2022 9:00 AM Medical  Record Number: [id number] Patient Account Number: [id number] Date of Birth/Sex: 01-06-1951 (71 y.o. M) Treating RN: Cornell Barman Primary Care Tosh Glaze: Tracie Harrier Other Clinician: Massie Kluver Referring Gregroy Dombkowski: Tracie Harrier Treating Dereona Kolodny/Extender: Skipper Cliche in Treatment: 9 Wound Status Wound Number: 2 Primary Diabetic Wound/Ulcer of the Lower Extremity Etiology: Wound Location: Left, Plantar Toe Great Wound Open Wounding Event: Gradually Appeared Status: Date Acquired: 12/09/2021 Comorbid Cataracts, Sickle Cell Disease, Sleep Apnea, Weeks Of Treatment: 9 History: Hypertension, Type II Diabetes, Gout, Osteoarthritis Clustered Wound: No Photos Wound Measurements Length: (cm) 1 Width: (cm) 1.1 Depth: (cm) 0.4 Area: (cm) 0.864 Volume: (cm) 0.346 % Reduction in Area: -205.3% % Reduction in Volume: -145.4% Epithelialization: None Wound Description Classification: Grade 2 Fo Wound Margin: Flat and Intact Sl Exudate Amount: Medium Exudate Type: Serous Exudate Color: amber ul Odor After Cleansing: No ough/Fibrino Yes Wound Bed Granulation Amount: Large (67-100%) Exposed Structure Granulation Quality: Pink, Pale, Hyper-granulation Fascia Exposed: No Necrotic Amount: Small (1-33%) Fat Layer (Subcutaneous Tissue) Exposed: Yes Necrotic Quality: Adherent Slough Tendon Exposed: No Muscle Exposed: No Joint Exposed: No Bone Exposed: No Treatment Notes Wound #2 (Toe Great) Wound Laterality: Plantar, Left Cleanser Peri-Wound Care Topical ASBURY, HAIR ([id number]) Primary Dressing Hydrofera Blue Ready Transfer Foam, 4x5 (in/in) Discharge Instruction: Apply Hydrofera Blue Ready to wound bed as directed Secondary Dressing Conforming [place] Roll-Small Discharge Instruction: Bath  as directed Secured With [place] H Soft Cloth Surgical Tape, 2x2 (in/yd) Compression Wrap Compression  Stockings Add-Ons Electronic Signature(s) Signed: 03/14/2022 4:15:07 PM By: Massie Kluver Signed: 03/16/2022 3:21:50 PM By: [name], BSN, RN, CWS, Kim RN, BSN Entered By: Massie Kluver on 03/14/2022 09:14:04 Gene Brown ([id number]) -------------------------------------------------------------------------------- Vitals Details Patient Name: Gene Brown Date of Service: 03/14/2022 9:00 AM Medical Record Number: [id number] Patient Account Number: [id number] Date of Birth/Sex: Sep 12, 1950 (71 y.o. M) Treating RN: Cornell Barman Primary Care Lillyth Spong: Tracie Harrier Other Clinician: Massie Kluver Referring Loriel Diehl: Tracie Harrier Treating Koryn Charlot/Extender: Skipper Cliche in Treatment: 9 Vital Signs Time Taken: 09:05 Temperature (F): 97.7 Height (in): 68 Pulse (bpm): 59 Weight (lbs): 217 Respiratory Rate (breaths/min): 18 Body Mass Index (BMI): 33 Blood Pressure (mmHg): 151/85 Reference Range: 80 - 120 mg / dl Electronic Signature(s) Signed: 03/14/2022 4:15:07 PM By: Massie Kluver Entered By: Massie Kluver on 03/14/2022 09:07:34

## 2022-03-21 ENCOUNTER — Encounter: Payer: Medicare Other | Admitting: Physician Assistant

## 2022-03-21 DIAGNOSIS — E11621 Type 2 diabetes mellitus with foot ulcer: Secondary | ICD-10-CM | POA: Diagnosis not present

## 2022-03-23 NOTE — Progress Notes (Signed)
LONZA, SHIMABUKURO ([id number]) Visit Report for 03/21/2022 Arrival Information Details Patient Name: Gene Brown, Gene Brown Date of Service: 03/21/2022 12:30 PM Medical Record Number: [id number] Patient Account Number: [id number] Date of Birth/Sex: 1951-05-28 (71 y.o. M) Treating RN: Carlene Coria Primary Care Deiondre Harrower: Tracie Harrier Other Clinician: Referring Cailan Antonucci: Tracie Harrier Treating Enzo Treu/Extender: Skipper Cliche in Treatment: 10 Visit Information History Since Last Visit All ordered tests and consults were completed: No Patient Arrived: Kasandra Knudsen Added or deleted any medications: No Arrival Time: 12:35 Any new allergies or adverse reactions: No Accompanied By: self Had a fall or experienced change in No Transfer Assistance: None activities of daily living that may affect Patient Identification Verified: Yes risk of falls: Secondary Verification Process Completed: Yes Signs or symptoms of abuse/neglect since last visito No Patient Has Alerts: Yes Hospitalized since last visit: No Patient Alerts: Patient on Blood Thinner Implantable device outside of the clinic excluding No 01/07/2022 Arpelar>220 on left cellular tissue based products placed in the center since last visit: Has Dressing in Place as Prescribed: Yes Has Compression in Place as Prescribed: Yes Has Footwear/Offloading in Place as Prescribed: Yes Left: Wedge Shoe Pain Present Now: No Electronic Signature(s) Signed: 03/22/2022 8:41:24 AM By: Carlene Coria RN Entered By: Carlene Coria on 03/21/2022 12:39:41 Gene Brown ([id number]) -------------------------------------------------------------------------------- Encounter Discharge Information Details Patient Name: Gene Brown Date of Service: 03/21/2022 12:30 PM Medical Record Number: [id number] Patient Account Number: [id number] Date of Birth/Sex: [birth year] (71 y.o. M) Treating RN: Carlene Coria Primary Care Isaih Bulger: Tracie Harrier Other  Clinician: Referring Anavi Branscum: Tracie Harrier Treating Ledarrius Beauchaine/Extender: Skipper Cliche in Treatment: 10 Encounter Discharge Information Items Post Procedure Vitals Discharge Condition: Stable Temperature (F): 97.7 Ambulatory Status: Cane Pulse (bpm): 74 Discharge Destination: Home Respiratory Rate (breaths/min): 18 Transportation: Private Auto Blood Pressure (mmHg): 165/74 Accompanied By: self Schedule Follow-up Appointment: Yes Clinical Summary of Care: Electronic Signature(s) Signed: 03/22/2022 8:41:24 AM By: Carlene Coria RN Entered By: Carlene Coria on 03/21/2022 12:59:08 Gene Brown ([id number]) -------------------------------------------------------------------------------- Lower Extremity Assessment Details Patient Name: Gene Brown Date of Service: 03/21/2022 12:30 PM Medical Record Number: [id number] Patient Account Number: [id number] Date of Birth/Sex: November 01, 1950 (71 y.o. M) Treating RN: Carlene Coria Primary Care Ibn Stief: Tracie Harrier Other Clinician: Referring Keilynn Marano: Tracie Harrier Treating Shamiyah Ngu/Extender: Skipper Cliche in Treatment: 10 Edema Assessment Assessed: [Left: No] [Right: No] Edema: [Left: Ye] [Right: s] Calf Left: Right: Point of Measurement: 28 cm From Medial Instep 45 cm Ankle Left: Right: Point of Measurement: 11 cm From Medial Instep 30 cm Vascular Assessment Pulses: Dorsalis Pedis Palpable: [Left:Yes] Electronic Signature(s) Signed: 03/22/2022 8:41:24 AM By: Carlene Coria RN Entered By: Carlene Coria on 03/21/2022 12:45:30 Gene Brown ([id number]) -------------------------------------------------------------------------------- Multi Wound Chart Details Patient Name: Gene Brown Date of Service: 03/21/2022 12:30 PM Medical Record Number: [id number] Patient Account Number: [id number] Date of Birth/Sex: Oct 05, 1950 (71 y.o. M) Treating RN: Carlene Coria Primary Care Yeny Schmoll: Tracie Harrier Other  Clinician: Referring Vernie Piet: Tracie Harrier Treating Jisella Ashenfelter/Extender: Skipper Cliche in Treatment: 10 Vital Signs Height(in): 68 Pulse(bpm): 85 Weight(lbs): 217 Blood Pressure(mmHg): 165/74 Body Mass Index(BMI): 33 Temperature(F): 97.7 Respiratory Rate(breaths/min): 18 Photos: [N/A:N/A] Wound Location: Left, Plantar Toe Great N/A N/A Wounding Event: Gradually Appeared N/A N/A Primary Etiology: Diabetic Wound/Ulcer of the Lower N/A N/A Extremity Comorbid History: Cataracts, Sickle Cell Disease, Sleep N/A N/A Apnea, Hypertension, Type II Diabetes, Gout, Osteoarthritis Date Acquired: 12/09/2021 N/A N/A Weeks of Treatment: 10 N/A N/A Wound Status: Open N/A N/A Wound Recurrence: No N/A N/A  Measurements L x W x D (cm) 1x1x0.4 N/A N/A Area (cm) : 0.785 N/A N/A Volume (cm) : 0.314 N/A N/A % Reduction in Area: -177.40% N/A N/A % Reduction in Volume: -122.70% N/A N/A Classification: Grade 2 N/A N/A Exudate Amount: Medium N/A N/A Exudate Type: Serosanguineous N/A N/A Exudate Color: red, brown N/A N/A Wound Margin: Flat and Intact N/A N/A Granulation Amount: Large (67-100%) N/A N/A Granulation Quality: Pink, Pale N/A N/A Necrotic Amount: Small (1-33%) N/A N/A Exposed Structures: Fat Layer (Subcutaneous Tissue): N/A N/A Yes Fascia: No Tendon: No Muscle: No Joint: No Bone: No Epithelialization: None N/A N/A Treatment Notes Electronic Signature(s) Signed: 03/22/2022 8:41:24 AM By: Carlene Coria RN Entered By: Carlene Coria on 03/21/2022 12:46:00 Gene Brown ([id number]) Gene Brown ([id number]) -------------------------------------------------------------------------------- Multi-Disciplinary Care Plan Details Patient Name: Gene Brown Date of Service: 03/21/2022 12:30 PM Medical Record Number: [id number] Patient Account Number: [id number] Date of Birth/Sex: 1951/06/21 (71 y.o. M) Treating RN: Carlene Coria Primary Care Tivon Lemoine: Tracie Harrier Other  Clinician: Referring Khayden Herzberg: Tracie Harrier Treating Jalin Erpelding/Extender: Skipper Cliche in Treatment: 10 Active Inactive Wound/Skin Impairment Nursing Diagnoses: Impaired tissue integrity Knowledge deficit related to smoking impact on wound healing Goals: Patient/caregiver will verbalize understanding of skin care regimen Date Initiated: 01/07/2022 Date Inactivated: 02/21/2022 Target Resolution Date: 01/07/2022 Goal Status: Met Ulcer/skin breakdown will have a volume reduction of 30% by week 4 Date Initiated: 01/07/2022 Target Resolution Date: 02/04/2022 Goal Status: Active Ulcer/skin breakdown will have a volume reduction of 50% by week 8 Date Initiated: 01/07/2022 Target Resolution Date: 03/03/2022 Goal Status: Active Interventions: Assess patient/caregiver ability to obtain necessary supplies Assess patient/caregiver ability to perform ulcer/skin care regimen upon admission and as needed Assess ulceration(s) every visit Treatment Activities: Referred to DME Mekiah Wahler for dressing supplies : 01/07/2022 Skin care regimen initiated : 01/07/2022 Topical wound management initiated : 01/07/2022 Notes: Electronic Signature(s) Signed: 03/22/2022 8:41:24 AM By: Carlene Coria RN Entered By: Carlene Coria on 03/21/2022 12:45:43 Gene Brown ([id number]) -------------------------------------------------------------------------------- Pain Assessment Details Patient Name: Gene Brown Date of Service: 03/21/2022 12:30 PM Medical Record Number: [id number] Patient Account Number: [id number] Date of Birth/Sex: May 05, 1951 (71 y.o. M) Treating RN: Carlene Coria Primary Care Zadrian Mccauley: Tracie Harrier Other Clinician: Referring Enyla Lisbon: Tracie Harrier Treating Aby Gessel/Extender: Skipper Cliche in Treatment: 10 Active Problems Location of Pain Severity and Description of Pain Patient Has Paino No Site Locations Pain Management and Medication Current Pain Management: Electronic  Signature(s) Signed: 03/22/2022 8:41:24 AM By: Carlene Coria RN Entered By: Carlene Coria on 03/21/2022 12:40:10 Gene Brown ([id number]) -------------------------------------------------------------------------------- Wound Assessment Details Patient Name: Gene Brown Date of Service: 03/21/2022 12:30 PM Medical Record Number: [id number] Patient Account Number: [id number] Date of Birth/Sex: 1950-10-14 (71 y.o. M) Treating RN: Carlene Coria Primary Care Sunnie Odden: Tracie Harrier Other Clinician: Referring Sabas Frett: Tracie Harrier Treating Khamari Yousuf/Extender: Skipper Cliche in Treatment: 10 Wound Status Wound Number: 2 Primary Diabetic Wound/Ulcer of the Lower Extremity Etiology: Wound Location: Left, Plantar Toe Great Wound Open Wounding Event: Gradually Appeared Status: Date Acquired: 12/09/2021 Comorbid Cataracts, Sickle Cell Disease, Sleep Apnea, Weeks Of Treatment: 10 History: Hypertension, Type II Diabetes, Gout, Osteoarthritis Clustered Wound: No Photos Wound Measurements Length: (cm) 1 % Red Width: (cm) 1 % Red Depth: (cm) 0.4 Epith Area: (cm) 0.785 Tunn Volume: (cm) 0.314 Unde uction in Area: -177.4% uction in Volume: -122.7% elialization: None eling: No rmining: No Wound Description Classification: Grade 2 Foul Wound Margin: Flat and Intact Slou Exudate Amount: Medium Exudate Type: Serosanguineous Exudate  Color: red, brown Odor After Cleansing: No gh/Fibrino Yes Wound Bed Granulation Amount: Large (67-100%) Exposed Structure Granulation Quality: Pink, Pale Fascia Exposed: No Necrotic Amount: Small (1-33%) Fat Layer (Subcutaneous Tissue) Exposed: Yes Necrotic Quality: Adherent Slough Tendon Exposed: No Muscle Exposed: No Joint Exposed: No Bone Exposed: No Treatment Notes Wound #2 (Toe Great) Wound Laterality: Plantar, Left Cleanser Peri-Wound Care Topical BRENTIN, SHIN ([id number]) Primary Dressing Hydrofera Blue Ready Transfer  Foam, 4x5 (in/in) Discharge Instruction: Apply Hydrofera Blue Ready to wound bed as directed Secondary Dressing [place] Roll-Small Discharge Instruction: [place] as directed Secured With [place] H Soft Cloth Surgical Tape, 2x2 (in/yd) Compression Wrap Compression Stockings Add-Ons Electronic Signature(s) Signed: 03/22/2022 8:41:24 AM By: Carlene Coria RN Entered By: Carlene Coria on 03/21/2022 12:44:32 Gene Brown ([id number]) -------------------------------------------------------------------------------- Vitals Details Patient Name: Gene Brown Date of Service: 03/21/2022 12:30 PM Medical Record Number: [id number] Patient Account Number: [id number] Date of Birth/Sex: April 30, 1951 (71 y.o. M) Treating RN: Carlene Coria Primary Care Anyjah Roundtree: Tracie Harrier Other Clinician: Referring Eward Rutigliano: Tracie Harrier Treating John Williamsen/Extender: Skipper Cliche in Treatment: 10 Vital Signs Time Taken: 12:39 Temperature (F): 97.7 Height (in): 68 Pulse (bpm): 74 Weight (lbs): 217 Respiratory Rate (breaths/min): 18 Body Mass Index (BMI): 33 Blood Pressure (mmHg): 165/74 Reference Range: 80 - 120 mg / dl Electronic Signature(s) Signed: 03/22/2022 8:41:24 AM By: Carlene Coria RN Entered By: Carlene Coria on 03/21/2022 12:40:01

## 2022-03-23 NOTE — Progress Notes (Addendum)
Gene Brown, Gene V. ([id number]) Visit Report for 03/21/2022 Chief Complaint Document Details Patient Name: Gene Brown, Gene V. Date of Service: 03/21/2022 12:30 PM Medical Record Number: [id number] Patient Account Number: [id number] Date of Birth/Sex: 11/21/1950 (71 y.o. M) Treating RN: Yevonne PaxEpps, Carrie Primary Care Provider: Barbette ReichmannHande, Vishwanath Other Clinician: Referring Provider: Barbette ReichmannHande, Vishwanath Treating Provider/Extender: Rowan BlaseStone, Lauraine Crespo Weeks in Treatment: 10 Information Obtained from: Patient Chief Complaint Left great toe ulcer Electronic Signature(s) Signed: 03/21/2022 12:49:25 PM By: Lenda KelpStone III, Erez Mccallum PA-C Entered By: Lenda KelpStone III, Chamberlain Steinborn on 03/21/2022 12:49:25 Gene Brown, Gene V. ([id number]) -------------------------------------------------------------------------------- Debridement Details Patient Name: Gene Brown, Gene V. Date of Service: 03/21/2022 12:30 PM Medical Record Number: [id number] Patient Account Number: [id number] Date of Birth/Sex: 05/24/1951 (71 y.o. M) Treating RN: Yevonne PaxEpps, Carrie Primary Care Provider: Barbette ReichmannHande, Vishwanath Other Clinician: Referring Provider: Barbette ReichmannHande, Vishwanath Treating Provider/Extender: Rowan BlaseStone, Averyana Pillars Weeks in Treatment: 10 Debridement Performed for Wound #2 Left,Plantar Toe Great Assessment: Performed By: Physician Nelida MeuseStone, Florencia Zaccaro E., PA-C Debridement Type: Debridement Severity of Tissue Pre Debridement: Fat layer exposed Level of Consciousness (Pre- Awake and Alert procedure): Pre-procedure Verification/Time Out Yes - 12:55 Taken: Start Time: 12:55 Pain Control: Lidocaine 4% Topical Solution Total Area Debrided (L x W): 1 (cm) x 1 (cm) = 1 (cm) Tissue and other material Viable, Non-Viable, Callus, Slough, Subcutaneous, Skin: Dermis , Skin: Epidermis, Slough debrided: Level: Skin/Subcutaneous Tissue Debridement Description: Excisional Instrument: Curette Bleeding: Moderate Hemostasis Achieved: Pressure End Time: 12:58 Procedural Pain: 0 Post Procedural Pain:  0 Response to Treatment: Procedure was tolerated well Level of Consciousness (Post- Awake and Alert procedure): Post Debridement Measurements of Total Wound Length: (cm) 1 Width: (cm) 1 Depth: (cm) 0.4 Volume: (cm) 0.314 Character of Wound/Ulcer Post Debridement: Improved Severity of Tissue Post Debridement: Fat layer exposed Post Procedure Diagnosis Same as Pre-procedure Electronic Signature(s) Signed: 03/21/2022 4:45:05 PM By: Lenda KelpStone III, Pharoah Goggins PA-C Signed: 03/22/2022 8:41:24 AM By: Yevonne PaxEpps, Carrie RN Entered By: Yevonne PaxEpps, Carrie on 03/21/2022 12:58:13 Gene Brown, Gene V. ([id number]) -------------------------------------------------------------------------------- HPI Details Patient Name: Gene Brown, Gene V. Date of Service: 03/21/2022 12:30 PM Medical Record Number: [id number] Patient Account Number: [id number] Date of Birth/Sex: 10/25/1950 (71 y.o. M) Treating RN: Yevonne PaxEpps, Carrie Primary Care Provider: Barbette ReichmannHande, Vishwanath Other Clinician: Referring Provider: Barbette ReichmannHande, Vishwanath Treating Provider/Extender: Rowan BlaseStone, Chasitty Hehl Weeks in Treatment: 10 History of Present Illness HPI Description: 05/24/16; this is a 71 year old type II diabetic who probably has some degree of polyneuropathy. He tells me that he has had an area on the plantar aspect of his left great toe that is been followed by podiatry for most the last year. He has a lot of callus, the podiatrist which shave this down put a dressing on and he would follow pad monthly or sometimes longer intervals. He has not noted any drainage or pain. He has not had a recent x-ray. No serious attempt at offloading this with modified footwear.. Last hemoglobin A1c that he knows about was over 10 a month ago he is working hard on this with his primary physician. More recently he has been soaking this with peroxide ABIs are noncompressible and the left leg. He was told by his podiatrist he has a bone spur underneath the wound but did not wish any  surgical procedures. He has a caregiver at home for a very disabled wife, the nature of her illness is not certain 05/31/16 patient comes in much the same as last week amounts of callus nonviable subcutaneous tissue which required debridement. His x-ray suggested cortical bone irregularity. He will need an MRI. He tells me he  has old screws/hardware left knee however he thinks he has had a subsequent MRI after this 06/07/16 at this point in time patient has been attempting to offload his wound as much as he could on his own. He has made a conscious effort to avoid placing any pressure on the toe as he was moving around and walking and I do believe this has payed off and what I am seeing at this point in time. overall the wound appears to be somewhat improved in my opinion he is not having any significant discomfort at this point in time. In fact he tells me he has no pain at all. There is no interval sign of surrounding infection in the wound itself has actually decreased in size. Unfortunately the MRI was not ordered last week and therefore we do need to proceed with the MRI order today. again this is to evaluate the cortical bone irregularity noted on x-ray. 06/14/16 Patient has continued to use the offloading shoe at this point in time. He tells me this is somewhat difficult to get used to walking and but nonetheless he is very strict about wearing it. Again I believe this has paid off very well for him and his wound appears to be greatly improved. He does have some callused area surrounding the wound proximally that has caused some fluid collection of maceration of the callused area. This likely will require debridement today. Some adherent slough noted over the wound bed. 06/21/16; patient's wound on the plantar aspect of his left great toe is totally epithelialized. His MRI of the foot is tomorrow to follow-up on the cortical irregularity noted on plain x-ray. He does not have diabetic foot  where 06-28-16-He comes in today with a continued healed left great toe. MRI performed on 06/23/2016 shows no abscess osteomyelitis or septic joint. Patient was discharged from wound care center today. Readmission: 01-07-2022 upon evaluation today patient appears to be doing somewhat poorly in regard to total ulcer on his left foot. Fortunately there does not appear to be any signs of infection which is great news. Has been seen by Dr. Excell Seltzer and he did request from Dr. Excell Seltzer to come see [place] due to the fact that in the past several years back we were able to get one of his wounds healed about 4 weeks. Again I explained that that is always variable but nonetheless I definitely think we can help. Fortunately I see no evidence of infection. He does have a foot offloading shoe which I think is good to attempt for now he was able to get this healed before with a peg assist. Patient does have a history of type 2 diabetes, diabetic polyneuropathy, and hypertension. This current wound started on 12-09-2021. 01-20-2022 upon evaluation today patient appears to be doing poorly in regard to his great toe. I am actually very concerned based on what I am seeing here at this point about how things are progressing. I do not believe there is any evidence of infection systemically that is obvious but locally I am concerned about the possibility this could even be an osteomyelitis type situation. 01-28-2022 upon evaluation today patient appears to be doing well with regard to his toe ulcer compared to what we were previous. Fortunately I do not see any signs of active infection at this time which is great news. No fevers, chills, nausea, vomiting, or diarrhea. With that being said the patient has not started his Augmentin yet which I definitely think he needs to get  on board ASAP. I do see some signs of new skin growth I think he is using the front offloading shoe which is good for the time being. 02-04-2022 upon evaluation  today patient appears to be doing okay in regard to his toe ulcer this is actually measuring a little bit smaller but still has a bit of callus buildup but not as much as we have seen in the past. Fortunately I do not see any evidence of active infection locally or systemically which is great news. No fevers, chills, nausea, vomiting, or diarrhea. Marland Kitchen 02-14-2022 upon evaluation today patient's wound is actually showing signs of good improvement which is great news. Fortunately I do not see any evidence of active infection locally or systemically which is great news as well. No fevers, chills, nausea, vomiting, or diarrhea. 02-21-2022 upon evaluation today patient's toe ulcer actually is showing signs of excellent improvement. I do not see any evidence of infection locally or systemically which is great news and overall I am extremely pleased with where we stand today. I do not see any signs of active infection locally or systemically which is great news. No fevers, chills, nausea, vomiting, or diarrhea. 7/24; plantar left great toe no real improvement since last week. Using silver alginate gauze. He has a forefoot offloading boot. He also has lymphedema and uses his compression pumps I do not think this is adding any particular difficulties. It sounds as though he is still pretty active 03-07-2022 upon evaluation today patient appears to be doing excellent in regard to the wound compared to previous pictures. He still has a quite deep wound on the toe good news is the x-ray did not show any signs of osteomyelitis which is excellent. He does need some debridement today. 03-14-2022 upon evaluation today patient appears to be doing well currently in regard to his wound. He has actually been tolerating the dressing changes without complication. With that being said the dressing seems to either be draining a lot or getting very wet otherwise I think we need to have him change this more frequently. DAMASCUS, FELDPAUSCH ([id number]) 03-21-2022 upon evaluation today patient appears to be doing well with regard to his toe ulcer I am actually seeing signs of improvement which is great news. This is slow but nonetheless the skin around the edges of the wound are definitely improving as we proceed here. I do not see any evidence of active infection locally or systemically which is great news. Electronic Signature(s) Signed: 03/21/2022 1:10:12 PM By: Lenda Kelp PA-C Entered By: Lenda Kelp on 03/21/2022 13:10:11 Gene Bang ([id number]) -------------------------------------------------------------------------------- Physical Exam Details Patient Name: Gene Bang Date of Service: 03/21/2022 12:30 PM Medical Record Number: [id number] Patient Account Number: [id number] Date of Birth/Sex: 1951/03/16 (71 y.o. M) Treating RN: Yevonne Pax Primary Care Provider: Barbette Reichmann Other Clinician: Referring Provider: Barbette Reichmann Treating Provider/Extender: Rowan Blase in Treatment: 10 Constitutional Well-nourished and well-hydrated in no acute distress. Respiratory normal breathing without difficulty. Psychiatric this patient is able to make decisions and demonstrates good insight into disease process. Alert and Oriented x 3. pleasant and cooperative. Notes Patient's wound bed actually showed signs of good granulation and epithelization at this point. Fortunately I do not see any evidence of infection at this time which is great and overall I am extremely pleased with where we stand today. Electronic Signature(s) Signed: 03/21/2022 1:10:24 PM By: Lenda Kelp PA-C Entered By: Lenda Kelp on 03/21/2022 13:10:24 Horlacher, Nemiah Commander (  [id number]) -------------------------------------------------------------------------------- Physician Orders Details Patient Name: Gene Bang Date of Service: 03/21/2022 12:30 PM Medical Record Number: [id number] Patient Account Number:  [id number] Date of Birth/Sex: January 11, 1951 (71 y.o. M) Treating RN: Yevonne Pax Primary Care Provider: Barbette Reichmann Other Clinician: Referring Provider: Barbette Reichmann Treating Provider/Extender: Rowan Blase in Treatment: 10 Verbal / Phone Orders: No Diagnosis Coding Follow-up Appointments Wound #2 Left,Plantar Toe Great o Return Appointment in 1 week. o Nurse Visit as needed Bathing/ Shower/ Hygiene o May shower; gently cleanse wound with antibacterial soap, rinse and pat dry prior to dressing wounds o No tub bath. Anesthetic (Use 'Patient Medications' Section for Anesthetic Order Entry) o Lidocaine applied to wound bed Edema Control - Lymphedema / Segmental Compressive Device / Other o Patient to wear own Velcro compression garment. Remove compression stockings every night before going to bed and put on every morning when getting up. - patient needs juxtalite or equivalent velcro wrap bilat lower extremities due to chronic venous insufficiency and stage 3 lympthedema o Compression Pump: Use compression pump on left lower extremity for 60 minutes, twice daily. o Compression Pump: Use compression pump on right lower extremity for 60 minutes, twice daily. o DO YOUR BEST to sleep in the bed at night. DO NOT sleep in your recliner. Long hours of sitting in a recliner leads to swelling of the legs and/or potential wounds on your backside. Off-Loading o Offloading felt to foot. - donut cut in offloading felt o Other: - front off-loader left Additional Orders / Instructions o Follow Nutritious Diet and Increase Protein Intake o Activity as tolerated Wound Treatment Wound #2 - Toe Great Wound Laterality: Plantar, Left Primary Dressing: Hydrofera Blue Ready Transfer Foam, 4x5 (in/in) 1 x Per Day/15 Days Discharge Instructions: double layer cut to size of wound Secondary Dressing: Gauze 1 x Per Day/15 Days Discharge Instructions: As directed: dry,  moistened with saline or moistened with Dakins Solution Secured With: Medipore Tape - 104M Medipore H Soft Cloth Surgical Tape, 2x2 (in/yd) 1 x Per Day/15 Days Electronic Signature(s) Signed: 03/21/2022 4:45:05 PM By: Lenda Kelp PA-C Signed: 03/22/2022 8:41:24 AM By: Yevonne Pax RN Entered By: Yevonne Pax on 03/21/2022 13:16:45 Gene Bang ([id number]) -------------------------------------------------------------------------------- Prescription 03/21/2022 Patient Name: Gene Bang Provider: Allen Derry PA-C Date of Birth: 1950-08-25 NPI#: [id number] Sex: M DEA#: [id number] Phone #: [phone] License #: Patient Address: [facility] [address] [facility] [address] [facility] [place], [place] [address] [address] [place], [place] [address] [phone] Allergies Statins-Hmg-Coa Reductase Inhibitors Provider's Orders Patient to wear own Velcro compression garment. Remove compression stockings every night before going to bed and put on every morning when getting up. - patient needs juxtalite or equivalent velcro wrap bilat lower extremities due to chronic venous insufficiency and stage 3 lympthedema Hand Signature: Date(s): Electronic Signature(s) Signed: 03/21/2022 4:45:05 PM By: Lenda Kelp PA-C Signed: 03/22/2022 8:41:24 AM By: Yevonne Pax RN Entered By: Yevonne Pax on 03/21/2022 13:16:45 Gene Bang ([id number]) --------------------------------------------------------------------------------  Problem List Details Patient Name: Gene Bang Date of Service: 03/21/2022 12:30 PM Medical Record Number: [id number] Patient Account Number: [id number] Date of Birth/Sex: 08-Dec-1950 (71 y.o. M) Treating RN: Yevonne Pax Primary Care Provider: Barbette Reichmann Other Clinician: Referring Provider: Barbette Reichmann Treating Provider/Extender: Rowan Blase in Treatment: 10 Active  Problems ICD-10 Encounter Code Description Active Date MDM Diagnosis E11.621 Type 2 diabetes mellitus with foot ulcer 01/07/2022 No Yes L97.522 Non-pressure chronic ulcer of other part  of left foot with fat layer 01/07/2022 No Yes exposed E11.42 Type 2 diabetes mellitus with diabetic polyneuropathy 01/07/2022 No Yes I10 Essential (primary) hypertension 01/07/2022 No Yes Inactive Problems Resolved Problems Electronic Signature(s) Signed: 03/21/2022 12:49:22 PM By: Lenda Kelp PA-C Entered By: Lenda Kelp on 03/21/2022 12:49:21 Gene Bang ([id number]) -------------------------------------------------------------------------------- Progress Note Details Patient Name: Gene Bang Date of Service: 03/21/2022 12:30 PM Medical Record Number: [id number] Patient Account Number: [id number] Date of Birth/Sex: 03-11-1951 (71 y.o. M) Treating RN: Yevonne Pax Primary Care Provider: Barbette Reichmann Other Clinician: Referring Provider: Barbette Reichmann Treating Provider/Extender: Rowan Blase in Treatment: 10 Subjective Chief Complaint Information obtained from Patient Left great toe ulcer History of Present Illness (HPI) 05/24/16; this is a 71 year old type II diabetic who probably has some degree of polyneuropathy. He tells me that he has had an area on the plantar aspect of his left great toe that is been followed by podiatry for most the last year. He has a lot of callus, the podiatrist which shave this down put a dressing on and he would follow pad monthly or sometimes longer intervals. He has not noted any drainage or pain. He has not had a recent x-ray. No serious attempt at offloading this with modified footwear.. Last hemoglobin A1c that he knows about was over 10 a month ago he is working hard on this with his primary physician. More recently he has been soaking this with peroxide ABIs are noncompressible and the left leg. He was told by his podiatrist he has a bone  spur underneath the wound but did not wish any surgical procedures. He has a caregiver at home for a very disabled wife, the nature of her illness is not certain 05/31/16 patient comes in much the same as last week amounts of callus nonviable subcutaneous tissue which required debridement. His x-ray suggested cortical bone irregularity. He will need an MRI. He tells me he has old screws/hardware left knee however he thinks he has had a subsequent MRI after this 06/07/16 at this point in time patient has been attempting to offload his wound as much as he could on his own. He has made a conscious effort to avoid placing any pressure on the toe as he was moving around and walking and I do believe this has payed off and what I am seeing at this point in time. overall the wound appears to be somewhat improved in my opinion he is not having any significant discomfort at this point in time. In fact he tells me he has no pain at all. There is no interval sign of surrounding infection in the wound itself has actually decreased in size. Unfortunately the MRI was not ordered last week and therefore we do need to proceed with the MRI order today. again this is to evaluate the cortical bone irregularity noted on x-ray. 06/14/16 Patient has continued to use the offloading shoe at this point in time. He tells me this is somewhat difficult to get used to walking and but nonetheless he is very strict about wearing it. Again I believe this has paid off very well for him and his wound appears to be greatly improved. He does have some callused area surrounding the wound proximally that has caused some fluid collection of maceration of the callused area. This likely will require debridement today. Some adherent slough noted over the wound bed. 06/21/16; patient's wound on the plantar aspect of his left great toe is totally epithelialized. His MRI  of the foot is tomorrow to follow-up on the cortical irregularity noted on  plain x-ray. He does not have diabetic foot where 06-28-16-He comes in today with a continued healed left great toe. MRI performed on 06/23/2016 shows no abscess osteomyelitis or septic joint. Patient was discharged from wound care center today. Readmission: 01-07-2022 upon evaluation today patient appears to be doing somewhat poorly in regard to total ulcer on his left foot. Fortunately there does not appear to be any signs of infection which is great news. Has been seen by Dr. Excell Seltzer and he did request from Dr. Excell Seltzer to come see [place] due to the fact that in the past several years back we were able to get one of his wounds healed about 4 weeks. Again I explained that that is always variable but nonetheless I definitely think we can help. Fortunately I see no evidence of infection. He does have a foot offloading shoe which I think is good to attempt for now he was able to get this healed before with a peg assist. Patient does have a history of type 2 diabetes, diabetic polyneuropathy, and hypertension. This current wound started on 12-09-2021. 01-20-2022 upon evaluation today patient appears to be doing poorly in regard to his great toe. I am actually very concerned based on what I am seeing here at this point about how things are progressing. I do not believe there is any evidence of infection systemically that is obvious but locally I am concerned about the possibility this could even be an osteomyelitis type situation. 01-28-2022 upon evaluation today patient appears to be doing well with regard to his toe ulcer compared to what we were previous. Fortunately I do not see any signs of active infection at this time which is great news. No fevers, chills, nausea, vomiting, or diarrhea. With that being said the patient has not started his Augmentin yet which I definitely think he needs to get on board ASAP. I do see some signs of new skin growth I think he is using the front offloading shoe which is good for  the time being. 02-04-2022 upon evaluation today patient appears to be doing okay in regard to his toe ulcer this is actually measuring a little bit smaller but still has a bit of callus buildup but not as much as we have seen in the past. Fortunately I do not see any evidence of active infection locally or systemically which is great news. No fevers, chills, nausea, vomiting, or diarrhea. Marland Kitchen 02-14-2022 upon evaluation today patient's wound is actually showing signs of good improvement which is great news. Fortunately I do not see any evidence of active infection locally or systemically which is great news as well. No fevers, chills, nausea, vomiting, or diarrhea. 02-21-2022 upon evaluation today patient's toe ulcer actually is showing signs of excellent improvement. I do not see any evidence of infection locally or systemically which is great news and overall I am extremely pleased with where we stand today. I do not see any signs of active infection locally or systemically which is great news. No fevers, chills, nausea, vomiting, or diarrhea. 7/24; plantar left great toe no real improvement since last week. Using silver alginate gauze. He has a forefoot offloading boot. He also has lymphedema and uses his compression pumps I do not think this is adding any particular difficulties. It sounds as though he is still pretty active 03-07-2022 upon evaluation today patient appears to be doing excellent in regard to the wound compared  to previous pictures. He still has a quite ORIEN, MAYHALL. ([id number]) deep wound on the toe good news is the x-ray did not show any signs of osteomyelitis which is excellent. He does need some debridement today. 03-14-2022 upon evaluation today patient appears to be doing well currently in regard to his wound. He has actually been tolerating the dressing changes without complication. With that being said the dressing seems to either be draining a lot or getting very wet otherwise  I think we need to have him change this more frequently. 03-21-2022 upon evaluation today patient appears to be doing well with regard to his toe ulcer I am actually seeing signs of improvement which is great news. This is slow but nonetheless the skin around the edges of the wound are definitely improving as we proceed here. I do not see any evidence of active infection locally or systemically which is great news. Objective Constitutional Well-nourished and well-hydrated in no acute distress. Vitals Time Taken: 12:39 PM, Height: 68 in, Weight: 217 lbs, BMI: 33, Temperature: 97.7 F, Pulse: 74 bpm, Respiratory Rate: 18 breaths/min, Blood Pressure: 165/74 mmHg. Respiratory normal breathing without difficulty. Psychiatric this patient is able to make decisions and demonstrates good insight into disease process. Alert and Oriented x 3. pleasant and cooperative. General Notes: Patient's wound bed actually showed signs of good granulation and epithelization at this point. Fortunately I do not see any evidence of infection at this time which is great and overall I am extremely pleased with where we stand today. Integumentary (Hair, Skin) Wound #2 status is Open. Original cause of wound was Gradually Appeared. The date acquired was: 12/09/2021. The wound has been in treatment 10 weeks. The wound is located on the [employer]. The wound measures 1cm length x 1cm width x 0.4cm depth; 0.785cm^2 area and 0.314cm^3 volume. There is Fat Layer (Subcutaneous Tissue) exposed. There is no tunneling or undermining noted. There is a medium amount of serosanguineous drainage noted. The wound margin is flat and intact. There is large (67-100%) pink, pale granulation within the wound bed. There is a small (1-33%) amount of necrotic tissue within the wound bed including Adherent Slough. 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 There was a Excisional Skin/Subcutaneous Tissue Debridement with a total area of 1 sq cm performed by Nelida Meuse., PA-C. With the following instrument(s): Curette to remove Viable and Non-Viable tissue/material. Material removed includes Callus, Subcutaneous Tissue, Slough, Skin: Dermis, and Skin: Epidermis after achieving pain control using Lidocaine 4% Topical Solution. No specimens were taken. A time out was conducted at 12:55, prior to the start of the procedure. A Moderate amount of bleeding was controlled with Pressure. The procedure was tolerated well with a pain level of 0 throughout and a pain level of 0 following the procedure. Post Debridement Measurements: 1cm length x 1cm width x 0.4cm depth; 0.314cm^3 volume. Character of Wound/Ulcer Post Debridement is improved. Severity of Tissue Post Debridement is: Fat layer exposed. Post procedure Diagnosis Wound #2: Same as Pre-Procedure ZAYED, GRIFFIE ([id number]) Plan Follow-up Appointments: Wound #2 Left,Plantar Toe Great: Return Appointment in 1 week. Nurse Visit as needed Bathing/ Shower/  Hygiene: May shower; gently cleanse wound with antibacterial soap, rinse and pat dry prior to dressing wounds No tub bath. Anesthetic (Use 'Patient Medications' Section for Anesthetic Order Entry): Lidocaine applied to wound bed Edema Control - Lymphedema / Segmental Compressive Device / Other: Patient to wear own Velcro compression garment. Remove compression stockings every night before going to bed and put on every morning when getting up. - patient needs juxtalite or equivalent velcro wrap bilat lower extremities due to  chronic venous insufficiency and stage 3 lympthedema Compression Pump: Use compression pump on left lower extremity for 60 minutes, twice daily. Compression Pump: Use compression pump on right lower extremity for 60 minutes, twice daily. DO YOUR BEST to sleep in the bed at night. DO NOT sleep in your recliner. Long hours of sitting in a recliner leads to swelling of the legs and/or potential wounds on your backside. Off-Loading: Offloading felt to foot. - donut cut in offloading felt Other: - front off-loader left Additional Orders / Instructions: Follow Nutritious Diet and Increase Protein Intake Activity as tolerated WOUND #2: - Toe Great Wound Laterality: Plantar, Left Primary Dressing: Hydrofera Blue Ready Transfer Foam, 4x5 (in/in) 1 x Per Day/15 Days Discharge Instructions: Apply Hydrofera Blue Ready to wound bed as directed Secondary Dressing: Conforming Guaze Roll-Small 1 x Per Day/15 Days Discharge Instructions: Apply Conforming Stretch Guaze Bandage as directed Secured With: Medipore Tape - 59M Medipore H Soft Cloth Surgical Tape, 2x2 (in/yd) 1 x Per Day/15 Days 1. I am good recommend that we going continue with the wound care measures as before and the patient is in agreement with plan. This includes the use of the [facility] which I think is really doing a great job here. 2. Also can recommend that we continue with the felt offloading. I think this should do better than the foam which was trapping too much moisture. 3. Patient's clinic continue to use Tubigrip for now and opinion on how things proceed we will switch over to his compression sock whenever he gets this. We will see patient back for reevaluation in 1 week here in the clinic. If anything worsens or changes patient will contact our office for additional recommendations. Electronic Signature(s) Signed: 03/21/2022 1:11:04 PM By: Lenda Kelp PA-C Entered By: Lenda Kelp on 03/21/2022 13:11:04 Gene Bang ([id number]) -------------------------------------------------------------------------------- SuperBill Details Patient Name: Gene Bang Date of Service: 03/21/2022 Medical Record Number: [id number] Patient Account Number: [id number] Date of Birth/Sex: 04/09/1951 (71 y.o. M) Treating RN: Yevonne Pax Primary Care Provider: Barbette Reichmann Other Clinician: Referring Provider: Barbette Reichmann Treating Provider/Extender: Rowan Blase in Treatment: 10 Diagnosis Coding ICD-10 Codes Code Description E11.621 Type 2 diabetes mellitus with foot ulcer 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osed E11.42 Type 2 diabetes mellitus with diabetic polyneuropathy I10 Essential (primary) hypertension Facility Procedures CPT4 Code: 78295621 Description: 11042 - DEB SUBQ TISSUE 20 SQ CM/< Modifier: Quantity: 1 CPT4 Code: Description: ICD-10 Diagnosis Description 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 Modifier: osed Quantity: Physician Procedures CPT4 Code: 3086578 Description: 11042 - WC PHYS SUBQ TISS 20 SQ CM Modifier: Quantity: 1 CPT4 Code: Description: ICD-10 Diagnosis Description 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 Modifier: osed Quantity: Electronic Signature(s) Signed: 03/21/2022 1:11:13 PM By: Lenda Kelp PA-C Entered By: Lenda Kelp on 03/21/2022 13:11:12

## 2022-03-29 ENCOUNTER — Encounter: Payer: Medicare Other | Admitting: Physician Assistant

## 2022-03-29 DIAGNOSIS — E11621 Type 2 diabetes mellitus with foot ulcer: Secondary | ICD-10-CM | POA: Diagnosis not present

## 2022-03-29 NOTE — Progress Notes (Signed)
JAMI, OHLIN ([id number]) Visit Report for 03/29/2022 Arrival Information Details Patient Name: Gene Brown, Gene Brown Date of Service: 03/29/2022 3:30 PM Medical Record Number: [id number] Patient Account Number: [id number] Date of Birth/Sex: Jun 27, 1951 (71 y.o. M) Treating RN: Carlene Coria Primary Care Joie Hipps: Tracie Harrier Other Clinician: Referring Holleigh Crihfield: Tracie Harrier Treating Cordarious Zeek/Extender: Skipper Cliche in Treatment: 11 Visit Information History Since Last Visit All ordered tests and consults were completed: No Patient Arrived: Ambulatory Added or deleted any medications: No Arrival Time: 15:32 Any new allergies or adverse reactions: No Accompanied By: self Had a fall or experienced change in No Transfer Assistance: None activities of daily living that may affect Patient Identification Verified: Yes risk of falls: Secondary Verification Process Completed: Yes Signs or symptoms of abuse/neglect since last visito No Patient Has Alerts: Yes Hospitalized since last visit: No Patient Alerts: Patient on Blood Thinner Implantable device outside of the clinic excluding No 01/07/2022 Talala>220 on left cellular tissue based products placed in the center since last visit: Has Dressing in Place as Prescribed: Yes Has Compression in Place as Prescribed: Yes Pain Present Now: No Electronic Signature(s) Unsigned Entered ByCarlene Coria on 03/29/2022 15:32:35 Signature(s): Date(s): Gene Brown ([id number]) -------------------------------------------------------------------------------- Clinic Level of Care Assessment Details Patient Name: Gene Brown, Gene Brown Date of Service: 03/29/2022 3:30 PM Medical Record Number: [id number] Patient Account Number: [id number] Date of Birth/Sex: [birth year] (71 y.o. M) Treating RN: Carlene Coria Primary Care Kashayla Ungerer: Tracie Harrier Other Clinician: Referring Valjean Ruppel: Tracie Harrier Treating Tyshell Ramberg/Extender: Skipper Cliche in Treatment: 11 Clinic Level of Care Assessment Items TOOL 1 Quantity Score []  - Use when EandM and Procedure is performed on INITIAL visit 0 ASSESSMENTS - Nursing Assessment / Reassessment []  - General Physical Exam (combine w/ comprehensive assessment (listed just below) when performed on new 0 pt. evals) []  - 0 Comprehensive Assessment (HX, ROS, Risk Assessments, Wounds Hx, etc.) ASSESSMENTS - Wound and Skin Assessment / Reassessment []  - Dermatologic / Skin Assessment (not related to wound area) 0 ASSESSMENTS - Ostomy and/or Continence Assessment and Care []  - Incontinence Assessment and Management 0 []  - 0 Ostomy Care Assessment and Management (repouching, etc.) PROCESS - Coordination of Care []  - Simple Patient / Family Education for ongoing care 0 []  - 0 Complex (extensive) Patient / Family Education for ongoing care []  - 0 Staff obtains Programmer, systems, Records, Test Results / Process Orders []  - 0 Staff telephones HHA, Nursing Homes / Clarify orders / etc []  - 0 Routine Transfer to another Facility (non-emergent condition) []  - 0 Routine Hospital Admission (non-emergent condition) []  - 0 New Admissions / Biomedical engineer / Ordering NPWT, Apligraf, etc. []  - 0 Emergency Hospital Admission (emergent condition) PROCESS - Special Needs []  - Pediatric / Minor Patient Management 0 []  - 0 Isolation Patient Management []  - 0 Hearing / Language / Visual special needs []  - 0 Assessment of Community assistance (transportation, D/C planning, etc.) []  - 0 Additional assistance / Altered mentation []  - 0 Support Surface(s) Assessment (bed, cushion, seat, etc.) INTERVENTIONS - Miscellaneous []  - External ear exam 0 []  - 0 Patient Transfer (multiple staff / Civil Service fast streamer / Similar devices) []  - 0 Simple Staple / Suture removal (25 or less) []  - 0 Complex Staple / Suture removal (26 or more) []  - 0 Hypo/Hyperglycemic Management (do not check if billed  separately) []  - 0 Ankle / Brachial Index (ABI) - do not check if billed separately Has the patient been seen at the hospital within the last  three years: Yes Total Score: 0 Level Of Care: ____ Gene Brown ([id number]) Electronic Signature(s) Unsigned Entered ByCarlene Coria on 03/29/2022 15:59:16 Signature(s): Date(s): Gene Brown ([id number]) -------------------------------------------------------------------------------- Encounter Discharge Information Details Patient Name: Gene Brown Date of Service: 03/29/2022 3:30 PM Medical Record Number: [id number] Patient Account Number: [id number] Date of Birth/Sex: October 09, 1950 (71 y.o. M) Treating RN: Carlene Coria Primary Care Shawni Volkov: Tracie Harrier Other Clinician: Referring Alanzo Lamb: Tracie Harrier Treating Marvelyn Bouchillon/Extender: Skipper Cliche in Treatment: 11 Encounter Discharge Information Items Post Procedure Vitals Discharge Condition: Stable Temperature (F): 98.5 Ambulatory Status: Ambulatory Pulse (bpm): 78 Discharge Destination: Home Respiratory Rate (breaths/min): 18 Transportation: Private Auto Blood Pressure (mmHg): 162/74 Accompanied By: self Schedule Follow-up Appointment: Yes Clinical Summary of Care: Electronic Signature(s) Unsigned Entered ByCarlene Coria on 03/29/2022 16:00:12 Signature(s): Date(s): Gene Brown ([id number]) -------------------------------------------------------------------------------- Lower Extremity Assessment Details Patient Name: Gene Brown, Gene Brown Date of Service: 03/29/2022 3:30 PM Medical Record Number: [id number] Patient Account Number: [id number] Date of Birth/Sex: 1950-09-08 (71 y.o. M) Treating RN: Carlene Coria Primary Care Juelz Claar: Tracie Harrier Other Clinician: Referring Kelso Bibby: Tracie Harrier Treating Mikal Wisman/Extender: Skipper Cliche in Treatment: 11 Edema Assessment Assessed: [Left: No] [Right: No] Edema: [Left: Ye] [Right:  s] Calf Left: Right: Point of Measurement: 28 cm From Medial Instep 43 cm Ankle Left: Right: Point of Measurement: 11 cm From Medial Instep 30 cm Vascular Assessment Pulses: Dorsalis Pedis Palpable: [Left:Yes] Electronic Signature(s) Unsigned Entered ByCarlene Coria on 03/29/2022 15:39:01 Signature(s): Date(s): Gene Brown ([id number]) -------------------------------------------------------------------------------- Multi Wound Chart Details Patient Name: Gene Brown Date of Service: 03/29/2022 3:30 PM Medical Record Number: [id number] Patient Account Number: [id number] Date of Birth/Sex: 1951-06-23 (71 y.o. M) Treating RN: Carlene Coria Primary Care Saadiq Poche: Tracie Harrier Other Clinician: Referring Medardo Hassing: Tracie Harrier Treating Azari Janssens/Extender: Skipper Cliche in Treatment: 11 Vital Signs Height(in): 68 Pulse(bpm): 78 Weight(lbs): 217 Blood Pressure(mmHg): 162/74 Body Mass Index(BMI): 33 Temperature(F): 98.5 Respiratory Rate(breaths/min): 18 Photos: [N/A:N/A] Wound Location: Left, Plantar Toe Great N/A N/A Wounding Event: Gradually Appeared N/A N/A Primary Etiology: Diabetic Wound/Ulcer of the Lower N/A N/A Extremity Comorbid History: Cataracts, Sickle Cell Disease, Sleep N/A N/A Apnea, Hypertension, Type II Diabetes, Gout, Osteoarthritis Date Acquired: 12/09/2021 N/A N/A Weeks of Treatment: 11 N/A N/A Wound Status: Open N/A N/A Wound Recurrence: No N/A N/A Measurements L x W x D (cm) 1x0.9x0.3 N/A N/A Area (cm) : 0.707 N/A N/A Volume (cm) : 0.212 N/A N/A % Reduction in Area: -149.80% N/A N/A % Reduction in Volume: -50.40% N/A N/A Classification: Grade 2 N/A N/A Exudate Amount: Medium N/A N/A Exudate Type: Serosanguineous N/A N/A Exudate Color: red, brown N/A N/A Wound Margin: Flat and Intact N/A N/A Granulation Amount: Large (67-100%) N/A N/A Granulation Quality: Pink, Pale N/A N/A Necrotic Amount: Small (1-33%) N/A N/A Exposed  Structures: Fat Layer (Subcutaneous Tissue): N/A N/A Yes Fascia: No Tendon: No Muscle: No Joint: No Bone: No Epithelialization: None N/A N/A Treatment Notes Electronic Signature(s) Unsigned Entered ByCarlene Coria on 03/29/2022 15:39:25 Signature(s): Date(s): Gene Brown ([id number] Brown ([id number]) -------------------------------------------------------------------------------- Multi-Disciplinary Care Plan Details Patient Name: Gene Brown Date of Service: 03/29/2022 3:30 PM Medical Record Number: [id number] Patient Account Number: [id number] Date of Birth/Sex: [birth year] (71 y.o. M) Treating RN: Carlene Coria Primary Care Kortny Lirette: Tracie Harrier Other Clinician: Referring Kimora Stankovic: Tracie Harrier Treating Robbyn Hodkinson/Extender: Skipper Cliche in Treatment: 11 Active Inactive Wound/Skin Impairment Nursing Diagnoses: Impaired tissue integrity Knowledge deficit related to smoking impact on wound healing Goals: Patient/caregiver will  verbalize understanding of skin care regimen Date Initiated: 01/07/2022 Date Inactivated: 02/21/2022 Target Resolution Date: 01/07/2022 Goal Status: Met Ulcer/skin breakdown will have a volume reduction of 30% by week 4 Date Initiated: 01/07/2022 Target Resolution Date: 02/04/2022 Goal Status: Active Ulcer/skin breakdown will have a volume reduction of 50% by week 8 Date Initiated: 01/07/2022 Target Resolution Date: 03/03/2022 Goal Status: Active Interventions: Assess patient/caregiver ability to obtain necessary supplies Assess patient/caregiver ability to perform ulcer/skin care regimen upon admission and as needed Assess ulceration(s) every visit Treatment Activities: Referred to DME Jahi Roza for dressing supplies : 01/07/2022 Skin care regimen initiated : 01/07/2022 Topical wound management initiated : 01/07/2022 Notes: Electronic Signature(s) Unsigned Entered By: Carlene Coria on 03/29/2022  15:39:09 Signature(s): Date(s): Gene Brown ([id number]) -------------------------------------------------------------------------------- Pain Assessment Details Patient Name: Gene Brown, Gene Brown Date of Service: 03/29/2022 3:30 PM Medical Record Number: [id number] Patient Account Number: [id number] Date of Birth/Sex: [birth year] (71 y.o. M) Treating RN: Carlene Coria Primary Care Kharizma Lesnick: Tracie Harrier Other Clinician: Referring Sadey Yandell: Tracie Harrier Treating Daris Aristizabal/Extender: Skipper Cliche in Treatment: 11 Active Problems Location of Pain Severity and Description of Pain Patient Has Paino No Site Locations Pain Management and Medication Current Pain Management: Electronic Signature(s) Unsigned Entered ByCarlene Coria on 03/29/2022 15:33:01 Signature(s): Date(s): Gene Brown ([id number]) -------------------------------------------------------------------------------- Patient/Caregiver Education Details Patient Name: Gene Brown Date of Service: 03/29/2022 3:30 PM Medical Record Number: [id number] Patient Account Number: [id number] Date of Birth/Gender: [birth year] (71 y.o. M) Treating RN: Carlene Coria Primary Care Physician: Tracie Harrier Other Clinician: Referring Physician: Tracie Harrier Treating Physician/Extender: Skipper Cliche in Treatment: 11 Education Assessment Education Provided To: Patient Education Topics Provided Wound/Skin Impairment: Methods: Explain/Verbal Responses: State content correctly Electronic Signature(s) Unsigned Entered ByCarlene Coria on 03/29/2022 15:59:27 Signature(s): Date(s): Gene Brown ([id number]) -------------------------------------------------------------------------------- Wound Assessment Details Patient Name: Gene Brown, Gene Brown Date of Service: 03/29/2022 3:30 PM Medical Record Number: [id number] Patient Account Number: [id number] Date of Birth/Sex: Feb 24, 1951 (71 y.o. M) Treating RN:  Carlene Coria Primary Care Shandale Malak: Tracie Harrier Other Clinician: Referring Shekela Goodridge: Tracie Harrier Treating Naviah Belfield/Extender: Skipper Cliche in Treatment: 11 Wound Status Wound Number: 2 Primary Diabetic Wound/Ulcer of the Lower Extremity Etiology: Wound Location: Left, Plantar Toe Great Wound Open Wounding Event: Gradually Appeared Status: Date Acquired: 12/09/2021 Comorbid Cataracts, Sickle Cell Disease, Sleep Apnea, Weeks Of Treatment: 11 History: Hypertension, Type II Diabetes, Gout, Osteoarthritis Clustered Wound: No Photos Wound Measurements Length: (cm) 1 % Redu Width: (cm) 0.9 % Redu Depth: (cm) 0.3 Epithe Area: (cm) 0.707 Tunne Volume: (cm) 0.212 Under ction in Area: -149.8% ction in Volume: -50.4% lialization: None ling: No mining: No Wound Description Classification: Grade 2 Foul Wound Margin: Flat and Intact Sloug Exudate Amount: Medium Exudate Type: Serosanguineous Exudate Color: red, brown Odor After Cleansing: No h/Fibrino Yes Wound Bed Granulation Amount: Large (67-100%) Exposed Structure Granulation Quality: Pink, Pale Fascia Exposed: No Necrotic Amount: Small (1-33%) Fat Layer (Subcutaneous Tissue) Exposed: Yes Necrotic Quality: Adherent Slough Tendon Exposed: No Muscle Exposed: No Joint Exposed: No Bone Exposed: No Treatment Notes Wound #2 (Toe Great) Wound Laterality: Plantar, Left Cleanser Peri-Wound Care Topical Gene Brown, Gene Brown ([id number]) Primary Dressing Hydrofera Blue Ready Transfer Foam, 4x5 (in/in) Discharge Instruction: double layer cut to size of wound Secondary Dressing Gauze Discharge Instruction: As directed: dry, moistened with saline or moistened with Dakins Solution Secured With Medipore Tape - 43M Medipore H Soft Cloth Surgical Tape, 2x2 (in/yd) Compression Wrap Compression Stockings Add-Ons Electronic Signature(s) Unsigned Entered ByCarlene Coria on 03/29/2022  15:38:28 Signature(s): Date(s): Gene Brown, Gene Brown ([id number]) -------------------------------------------------------------------------------- Vitals Details Patient Name: Gene Brown Date of Service: 03/29/2022 3:30 PM Medical Record Number: [id number] Patient Account Number: [id number] Date of Birth/Sex: October 01, 1950 (71 y.o. M) Treating RN: Carlene Coria Primary Care Xolani Degracia: Tracie Harrier Other Clinician: Referring Lorren Splawn: Tracie Harrier Treating Jeryn Cerney/Extender: Skipper Cliche in Treatment: 11 Vital Signs Time Taken: 15:32 Temperature (F): 98.5 Height (in): 68 Pulse (bpm): 78 Weight (lbs): 217 Respiratory Rate (breaths/min): 18 Body Mass Index (BMI): 33 Blood Pressure (mmHg): 162/74 Reference Range: 80 - 120 mg / dl Electronic Signature(s) Unsigned Entered ByCarlene Coria on 03/29/2022 15:32:55 Signature(s): Date(s):

## 2022-03-29 NOTE — Progress Notes (Signed)
Gene Brown, Gene Brown ([id number]) Visit Report for 03/29/2022 Chief Complaint Document Details Patient Name: Gene Brown, Gene Brown Date of Service: 03/29/2022 3:30 PM Medical Record Number: [id number] Patient Account Number: [id number] Date of Birth/Sex: 03-31-1951 (71 y.o. M) Treating RN: Yevonne Pax Primary Care Provider: Barbette Reichmann Other Clinician: Referring Provider: Barbette Reichmann Treating Provider/Extender: Rowan Blase in Treatment: 11 Information Obtained from: Patient Chief Complaint Left great toe ulcer Electronic Signature(s) Signed: 03/29/2022 3:12:43 PM By: Lenda Kelp PA-C Entered By: Lenda Kelp on 03/29/2022 15:12:43 Gene Brown ([id number]) -------------------------------------------------------------------------------- Debridement Details Patient Name: Gene Brown Date of Service: 03/29/2022 3:30 PM Medical Record Number: [id number] Patient Account Number: [id number] Date of Birth/Sex: [birth year] (71 y.o. M) Treating RN: Yevonne Pax Primary Care Provider: Barbette Reichmann Other Clinician: Referring Provider: Barbette Reichmann Treating Provider/Extender: Rowan Blase in Treatment: 11 Debridement Performed for Wound #2 Left,Plantar Toe Great Assessment: Performed By: Physician Nelida Meuse., PA-C Debridement Type: Debridement Severity of Tissue Pre Debridement: Fat layer exposed Level of Consciousness (Pre- Awake and Alert procedure): Pre-procedure Verification/Time Out Yes - 15:55 Taken: Start Time: 15:55 Pain Control: Lidocaine 4% Topical Solution Total Area Debrided (L x W): 2 (cm) x 1.5 (cm) = 3 (cm) Tissue and other material Viable, Non-Viable, Callus, Slough, Subcutaneous, Skin: Dermis , Skin: Epidermis, Slough debrided: Level: Skin/Subcutaneous Tissue Debridement Description: Excisional Instrument: Curette Bleeding: Moderate Hemostasis Achieved: Pressure End Time: 16:00 Procedural Pain: 0 Post Procedural Pain:  0 Response to Treatment: Procedure was tolerated well Level of Consciousness (Post- Awake and Alert procedure): Post Debridement Measurements of Total Wound Length: (cm) 1 Width: (cm) 0.9 Depth: (cm) 0.3 Volume: (cm) 0.212 Character of Wound/Ulcer Post Debridement: Improved Severity of Tissue Post Debridement: Fat layer exposed Post Procedure Diagnosis Same as Pre-procedure Electronic Signature(s) Unsigned Entered ByYevonne Pax on 03/29/2022 15:59:06 Signature(s): Date(s): Gene Brown ([id number]) -------------------------------------------------------------------------------- HPI Details Patient Name: Gene Brown Date of Service: 03/29/2022 3:30 PM Medical Record Number: [id number] Patient Account Number: [id number] Date of Birth/Sex: [birth year] (71 y.o. M) Treating RN: Yevonne Pax Primary Care Provider: Barbette Reichmann Other Clinician: Referring Provider: Barbette Reichmann Treating Provider/Extender: Rowan Blase in Treatment: 11 History of Present Illness HPI Description: 05/24/16; this is a 71 year old type II diabetic who probably has some degree of polyneuropathy. He tells me that he has had an area on the plantar aspect of his left great toe that is been followed by podiatry for most the last year. He has a lot of callus, the podiatrist which shave this down put a dressing on and he would follow pad monthly or sometimes longer intervals. He has not noted any drainage or pain. He has not had a recent x-ray. No serious attempt at offloading this with modified footwear.. Last hemoglobin A1c that he knows about was over 10 a month ago he is working hard on this with his primary physician. More recently he has been soaking this with peroxide ABIs are noncompressible and the left leg. He was told by his podiatrist he has a bone spur underneath the wound but did not wish any surgical procedures. He has a caregiver at home for a very disabled wife, the nature  of her illness is not certain 05/31/16 patient comes in much the same as last week amounts of callus nonviable subcutaneous tissue which required debridement. His x-ray suggested cortical bone irregularity. He will need an MRI. He tells me he has old screws/hardware left knee however he thinks he has had a subsequent MRI  after this 06/07/16 at this point in time patient has been attempting to offload his wound as much as he could on his own. He has made a conscious effort to avoid placing any pressure on the toe as he was moving around and walking and I do believe this has payed off and what I am seeing at this point in time. overall the wound appears to be somewhat improved in my opinion he is not having any significant discomfort at this point in time. In fact he tells me he has no pain at all. There is no interval sign of surrounding infection in the wound itself has actually decreased in size. Unfortunately the MRI was not ordered last week and therefore we do need to proceed with the MRI order today. again this is to evaluate the cortical bone irregularity noted on x-ray. 06/14/16 Patient has continued to use the offloading shoe at this point in time. He tells me this is somewhat difficult to get used to walking and but nonetheless he is very strict about wearing it. Again I believe this has paid off very well for him and his wound appears to be greatly improved. He does have some callused area surrounding the wound proximally that has caused some fluid collection of maceration of the callused area. This likely will require debridement today. Some adherent slough noted over the wound bed. 06/21/16; patient's wound on the plantar aspect of his left great toe is totally epithelialized. His MRI of the foot is tomorrow to follow-up on the cortical irregularity noted on plain x-ray. He does not have diabetic foot where 06-28-16-He comes in today with a continued healed left great toe. MRI performed on  06/23/2016 shows no abscess osteomyelitis or septic joint. Patient was discharged from wound care center today. Readmission: 01-07-2022 upon evaluation today patient appears to be doing somewhat poorly in regard to total ulcer on his left foot. Fortunately there does not appear to be any signs of infection which is great news. Has been seen by Dr. Excell Seltzer and he did request from Dr. Excell Seltzer to come see [place] due to the fact that in the past several years back we were able to get one of his wounds healed about 4 weeks. Again I explained that that is always variable but nonetheless I definitely think we can help. Fortunately I see no evidence of infection. He does have a foot offloading shoe which I think is good to attempt for now he was able to get this healed before with a peg assist. Patient does have a history of type 2 diabetes, diabetic polyneuropathy, and hypertension. This current wound started on 12-09-2021. 01-20-2022 upon evaluation today patient appears to be doing poorly in regard to his great toe. I am actually very concerned based on what I am seeing here at this point about how things are progressing. I do not believe there is any evidence of infection systemically that is obvious but locally I am concerned about the possibility this could even be an osteomyelitis type situation. 01-28-2022 upon evaluation today patient appears to be doing well with regard to his toe ulcer compared to what we were previous. Fortunately I do not see any signs of active infection at this time which is great news. No fevers, chills, nausea, vomiting, or diarrhea. With that being said the patient has not started his Augmentin yet which I definitely think he needs to get on board ASAP. I do see some signs of new skin growth I think  he is using the front offloading shoe which is good for the time being. 02-04-2022 upon evaluation today patient appears to be doing okay in regard to his toe ulcer this is actually measuring  a little bit smaller but still has a bit of callus buildup but not as much as we have seen in the past. Fortunately I do not see any evidence of active infection locally or systemically which is great news. No fevers, chills, nausea, vomiting, or diarrhea. Marland Kitchen 02-14-2022 upon evaluation today patient's wound is actually showing signs of good improvement which is great news. Fortunately I do not see any evidence of active infection locally or systemically which is great news as well. No fevers, chills, nausea, vomiting, or diarrhea. 02-21-2022 upon evaluation today patient's toe ulcer actually is showing signs of excellent improvement. I do not see any evidence of infection locally or systemically which is great news and overall I am extremely pleased with where we stand today. I do not see any signs of active infection locally or systemically which is great news. No fevers, chills, nausea, vomiting, or diarrhea. 7/24; plantar left great toe no real improvement since last week. Using silver alginate gauze. He has a forefoot offloading boot. He also has lymphedema and uses his compression pumps I do not think this is adding any particular difficulties. It sounds as though he is still pretty active 03-07-2022 upon evaluation today patient appears to be doing excellent in regard to the wound compared to previous pictures. He still has a quite deep wound on the toe good news is the x-ray did not show any signs of osteomyelitis which is excellent. He does need some debridement today. 03-14-2022 upon evaluation today patient appears to be doing well currently in regard to his wound. He has actually been tolerating the dressing changes without complication. With that being said the dressing seems to either be draining a lot or getting very wet otherwise I think we need to have him change this more frequently. Gene Brown, Gene Brown ([id number]) 03-21-2022 upon evaluation today patient appears to be doing well with regard  to his toe ulcer I am actually seeing signs of improvement which is great news. This is slow but nonetheless the skin around the edges of the wound are definitely improving as we proceed here. I do not see any evidence of active infection locally or systemically which is great news. 03-29-2022 upon evaluation today patient appears to be doing fairly well except for with regard to his toe being somewhat red. I do think that we may want to place him back on the Augmentin which was previously beneficial for him. He is not in disagreement with this. Otherwise I did perform some debridement of clearway some of the necrotic debris today he seems to be tolerating this without complication. Electronic Signature(s) Signed: 03/29/2022 4:10:21 PM By: Lenda Kelp PA-C Entered By: Lenda Kelp on 03/29/2022 16:10:21 Gene Brown ([id number]) -------------------------------------------------------------------------------- Physical Exam Details Patient Name: Gene Brown Date of Service: 03/29/2022 3:30 PM Medical Record Number: [id number] Patient Account Number: [id number] Date of Birth/Sex: 07/13/1951 (71 y.o. M) Treating RN: Yevonne Pax Primary Care Provider: Barbette Reichmann Other Clinician: Referring Provider: Barbette Reichmann Treating Provider/Extender: Rowan Blase in Treatment: 11 Constitutional Well-nourished and well-hydrated in no acute distress. Respiratory normal breathing without difficulty. Psychiatric this patient is able to make decisions and demonstrates good insight into disease process. Alert and Oriented x 3. pleasant and cooperative. Notes Upon inspection patient's wound bed actually  showed signs of good granulation and epithelization at this point. Fortunately I do not see any evidence of infection locally or systemically which is great news and overall I am extremely pleased. Electronic Signature(s) Signed: 03/29/2022 4:10:35 PM By: Lenda Kelp  PA-C Entered By: Lenda Kelp on 03/29/2022 16:10:35 Gene Brown ([id number]) -------------------------------------------------------------------------------- Physician Orders Details Patient Name: Gene Brown Date of Service: 03/29/2022 3:30 PM Medical Record Number: [id number] Patient Account Number: [id number] Date of Birth/Sex: 12/12/1950 (71 y.o. M) Treating RN: Yevonne Pax Primary Care Provider: Barbette Reichmann Other Clinician: Referring Provider: Barbette Reichmann Treating Provider/Extender: Rowan Blase in Treatment: 11 Verbal / Phone Orders: No Diagnosis Coding ICD-10 Coding Code Description E11.621 Type 2 diabetes mellitus with foot ulcer 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osed E11.42 Type 2 diabetes mellitus with diabetic polyneuropathy I10 Essential (primary) hypertension Follow-up Appointments Wound #2 Left,Plantar Toe Great o Return Appointment in 1 week. o Nurse Visit as needed Bathing/ Shower/ Hygiene o May shower; gently cleanse wound with antibacterial soap, rinse and pat dry prior to dressing wounds o No tub bath. Anesthetic (Use 'Patient Medications' Section for Anesthetic Order Entry) o Lidocaine applied to wound bed Edema Control - Lymphedema / Segmental Compressive Device / Other o Patient to wear own Velcro compression garment. Remove compression stockings every night before going to bed and put on every morning when getting up. - patient needs juxtalite or equivalent velcro wrap bilat lower extremities due to chronic venous insufficiency and stage 3 lympthedema o Compression Pump: Use compression pump on left lower extremity for 60 minutes, twice daily. o Compression Pump: Use compression pump on right lower extremity for 60 minutes, twice daily. o DO YOUR BEST to sleep in the bed at night. DO NOT sleep in your recliner. Long hours of sitting in a recliner leads to swelling  of the legs and/or potential wounds on your backside. Off-Loading o Offloading felt to foot. - donut cut in offloading felt o Other: - front off-loader left Additional Orders / Instructions o Follow Nutritious Diet and Increase Protein Intake o Activity as tolerated Wound Treatment Wound #2 - Toe Great Wound Laterality: Plantar, Left Primary Dressing: Hydrofera Blue Ready Transfer Foam, 4x5 (in/in) 1 x Per Day/15 Days Discharge Instructions: double layer cut to size of wound Secondary Dressing: Gauze 1 x Per Day/15 Days Discharge Instructions: As directed: dry, moistened with saline or moistened with Dakins Solution Secured With: Medipore Tape - 15M Medipore H Soft Cloth Surgical Tape, 2x2 (in/yd) 1 x Per Day/15 Days Patient Medications Allergies: Statins-Hmg-Coa Reductase Inhibitors Notifications Medication Indication Start End amoxicillin-pot clavulanate 03/29/2022 DOSE 1 - oral 875 mg-125 mg tablet - 1 tablet oral taken 2 times per day for 14 days Gene Brown, Gene Brown ([id number]) Electronic Signature(s) Signed: 03/29/2022 4:12:03 PM By: Lenda Kelp PA-C Previous Signature: 03/29/2022 3:47:08 PM Version By: Yevonne Pax RN Entered By: Lenda Kelp on 03/29/2022 16:12:02 Gene Brown ([id number]) -------------------------------------------------------------------------------- Prescription 03/29/2022 Patient Name: Gene Brown Provider: Allen Derry PA-C Date of Birth: 10/19/1950 NPI#: [id number] Sex: M DEA#: [id number] Phone #: [phone] License #: Patient Address: [facility] [address] [facility] [address] [facility] [place], [place] [address] [address] [place], [place] [address] [phone] Allergies Statins-Hmg-Coa Reductase Inhibitors Provider's Orders Patient to wear own Velcro compression garment. Remove compression stockings every night before going to bed and put on every morning when getting  up. - patient  needs juxtalite or equivalent velcro wrap bilat lower extremities due to chronic venous insufficiency and stage 3 lympthedema Hand Signature: Date(s): Electronic Signature(s) Unsigned Entered By: Lenda Kelp on 03/29/2022 16:12:03 Signature(s): Date(s): Gene Brown ([id number]) --------------------------------------------------------------------------------  Problem List Details Patient Name: Gene Brown Date of Service: 03/29/2022 3:30 PM Medical Record Number: [id number] Patient Account Number: [id number] Date of Birth/Sex: 08-01-1951 (71 y.o. M) Treating RN: Yevonne Pax Primary Care Provider: Barbette Reichmann Other Clinician: Referring Provider: Barbette Reichmann Treating Provider/Extender: Rowan Blase in Treatment: 11 Active Problems ICD-10 Encounter Code Description Active Date MDM Diagnosis E11.621 Type 2 diabetes mellitus with foot ulcer 01/07/2022 No Yes L97.522 Non-pressure chronic ulcer of other part of left foot with fat layer 01/07/2022 No Yes exposed E11.42 Type 2 diabetes mellitus with diabetic polyneuropathy 01/07/2022 No Yes I10 Essential (primary) hypertension 01/07/2022 No Yes Inactive Problems Resolved Problems Electronic Signature(s) Signed: 03/29/2022 3:12:41 PM By: Lenda Kelp PA-C Entered By: Lenda Kelp on 03/29/2022 15:12:41 Gene Brown ([id number]) -------------------------------------------------------------------------------- Progress Note Details Patient Name: Gene Brown Date of Service: 03/29/2022 3:30 PM Medical Record Number: [id number] Patient Account Number: [id number] Date of Birth/Sex: 11/08/1950 (71 y.o. M) Treating RN: Yevonne Pax Primary Care Provider: Barbette Reichmann Other Clinician: Referring Provider: Barbette Reichmann Treating Provider/Extender: Rowan Blase in Treatment: 11 Subjective Chief Complaint Information obtained from Patient Left great toe ulcer History of Present  Illness (HPI) 05/24/16; this is a 71 year old type II diabetic who probably has some degree of polyneuropathy. He tells me that he has had an area on the plantar aspect of his left great toe that is been followed by podiatry for most the last year. He has a lot of callus, the podiatrist which shave this down put a dressing on and he would follow pad monthly or sometimes longer intervals. He has not noted any drainage or pain. He has not had a recent x-ray. No serious attempt at offloading this with modified footwear.. Last hemoglobin A1c that he knows about was over 10 a month ago he is working hard on this with his primary physician. More recently he has been soaking this with peroxide ABIs are noncompressible and the left leg. He was told by his podiatrist he has a bone spur underneath the wound but did not wish any surgical procedures. He has a caregiver at home for a very disabled wife, the nature of her illness is not certain 05/31/16 patient comes in much the same as last week amounts of callus nonviable subcutaneous tissue which required debridement. His x-ray suggested cortical bone irregularity. He will need an MRI. He tells me he has old screws/hardware left knee however he thinks he has had a subsequent MRI after this 06/07/16 at this point in time patient has been attempting to offload his wound as much as he could on his own. He has made a conscious effort to avoid placing any pressure on the toe as he was moving around and walking and I do believe this has payed off and what I am seeing at this point in time. overall the wound appears to be somewhat improved in my opinion he is not having any significant discomfort at this point in time. In fact he tells me he has no pain at all. There is no interval sign of surrounding infection in the wound itself has actually decreased in size. Unfortunately the MRI was not ordered last week and therefore we do need to proceed with the MRI order  today. again this is to evaluate the cortical bone irregularity noted on x-ray. 06/14/16 Patient has continued to use the offloading shoe at this point in time. He tells me this is somewhat difficult to get used to walking and but nonetheless he is very strict about wearing it. Again I believe this has paid off very well for him and his wound appears to be greatly improved. He does have some callused area surrounding the wound proximally that has caused some fluid collection of maceration of the callused area. This likely will require debridement today. Some adherent slough noted over the wound bed. 06/21/16; patient's wound on the plantar aspect of his left great toe is totally epithelialized. His MRI of the foot is tomorrow to follow-up on the cortical irregularity noted on plain x-ray. He does not have diabetic foot where 06-28-16-He comes in today with a continued healed left great toe. MRI performed on 06/23/2016 shows no abscess osteomyelitis or septic joint. Patient was discharged from wound care center today. Readmission: 01-07-2022 upon evaluation today patient appears to be doing somewhat poorly in regard to total ulcer on his left foot. Fortunately there does not appear to be any signs of infection which is great news. Has been seen by Dr. Excell SeltzerBaker and he did request from Dr. Excell SeltzerBaker to come see us due to the fact that in the past several years back we were able to get one of his wounds healed about 4 weeks. Again I explained that that is always variable but nonetheless I definitely think we can help. Fortunately I see no evidence of infection. He does have a foot offloading shoe which I think is good to attempt for now he was able to get this healed before with a peg assist. Patient does have a history of type 2 diabetes, diabetic polyneuropathy, and hypertension. This current wound started on 12-09-2021. 01-20-2022 upon evaluation today patient appears to be doing poorly in regard to his great  toe. I am actually very concerned based on what I am seeing here at this point about how things are progressing. I do not believe there is any evidence of infection systemically that is obvious but locally I am concerned about the possibility this could even be an osteomyelitis type situation. 01-28-2022 upon evaluation today patient appears to be doing well with regard to his toe ulcer compared to what we were previous. Fortunately I do not see any signs of active infection at this time which is great news. No fevers, chills, nausea, vomiting, or diarrhea. With that being said the patient has not started his Augmentin yet which I definitely think he needs to get on board ASAP. I do see some signs of new skin growth I think he is using the front offloading shoe which is good for the time being. 02-04-2022 upon evaluation today patient appears to be doing okay in regard to his toe ulcer this is actually measuring a little bit smaller but still has a bit of callus buildup but not as much as we have seen in the past. Fortunately I do not see any evidence of active infection locally or systemically which is great news. No fevers, chills, nausea, vomiting, or diarrhea. Marland Kitchen. 02-14-2022 upon evaluation today patient's wound is actually showing signs of good improvement which is great news. Fortunately I do not see any evidence of active infection locally or systemically which is great news as well. No fevers, chills, nausea, vomiting, or diarrhea. 02-21-2022 upon evaluation today patient's toe ulcer actually  is showing signs of excellent improvement. I do not see any evidence of infection locally or systemically which is great news and overall I am extremely pleased with where we stand today. I do not see any signs of active infection locally or systemically which is great news. No fevers, chills, nausea, vomiting, or diarrhea. 7/24; plantar left great toe no real improvement since last week. Using silver alginate  gauze. He has a forefoot offloading boot. He also has lymphedema and uses his compression pumps I do not think this is adding any particular difficulties. It sounds as though he is still pretty active 03-07-2022 upon evaluation today patient appears to be doing excellent in regard to the wound compared to previous pictures. He still has a quite Gene Brown, Gene Brown. ([id number]) deep wound on the toe good news is the x-ray did not show any signs of osteomyelitis which is excellent. He does need some debridement today. 03-14-2022 upon evaluation today patient appears to be doing well currently in regard to his wound. He has actually been tolerating the dressing changes without complication. With that being said the dressing seems to either be draining a lot or getting very wet otherwise I think we need to have him change this more frequently. 03-21-2022 upon evaluation today patient appears to be doing well with regard to his toe ulcer I am actually seeing signs of improvement which is great news. This is slow but nonetheless the skin around the edges of the wound are definitely improving as we proceed here. I do not see any evidence of active infection locally or systemically which is great news. 03-29-2022 upon evaluation today patient appears to be doing fairly well except for with regard to his toe being somewhat red. I do think that we may want to place him back on the Augmentin which was previously beneficial for him. He is not in disagreement with this. Otherwise I did perform some debridement of clearway some of the necrotic debris today he seems to be tolerating this without complication. Objective Constitutional Well-nourished and well-hydrated in no acute distress. Vitals Time Taken: 3:32 PM, Height: 68 in, Weight: 217 lbs, BMI: 33, Temperature: 98.5 F, Pulse: 78 bpm, Respiratory Rate: 18 breaths/min, Blood Pressure: 162/74 mmHg. Respiratory normal breathing without  difficulty. Psychiatric this patient is able to make decisions and demonstrates good insight into disease process. Alert and Oriented x 3. pleasant and cooperative. General Notes: Upon inspection patient's wound bed actually showed signs of good granulation and epithelization at this point. Fortunately I do not see any evidence of infection locally or systemically which is great news and overall I am extremely pleased. Integumentary (Hair, Skin) Wound #2 status is Open. Original cause of wound was Gradually Appeared. The date acquired was: 12/09/2021. The wound has been in treatment 11 weeks. The wound is located on the [employer]. The wound measures 1cm length x 0.9cm width x 0.3cm depth; 0.707cm^2 area and 0.212cm^3 volume. There is Fat Layer (Subcutaneous Tissue) exposed. There is no tunneling or undermining noted. There is a medium amount of serosanguineous drainage noted. The wound margin is flat and intact. There is large (67-100%) pink, pale granulation within the wound bed. There is a small (1-33%) amount of necrotic tissue within the wound bed including Adherent Slough. 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 There was a Excisional Skin/Subcutaneous Tissue Debridement with a total area of 3 sq cm performed by Nelida Meuse., PA-C. With the following instrument(s): Curette to remove Viable and Non-Viable tissue/material. Material removed includes Callus, Subcutaneous Tissue, Slough, Skin: Dermis, and Skin: Epidermis after achieving pain control using Lidocaine 4% Topical Solution.  No specimens were taken. A time out was conducted at 15:55, prior to the start of the procedure. A Moderate amount of bleeding was controlled with Pressure. The procedure was tolerated well with a pain level of 0 throughout and a pain level of 0 following the procedure. Post Debridement Gene Brown, Gene Brown ([id number]) Measurements: 1cm length x 0.9cm width x 0.3cm depth; 0.212cm^3 volume. Character of Wound/Ulcer Post Debridement is improved. Severity of Tissue Post Debridement is: Fat layer exposed. Post procedure Diagnosis Wound #2: Same as Pre-Procedure Plan Follow-up Appointments: Wound #2 Left,Plantar Toe Great: Return Appointment in 1 week. Nurse Visit as needed Bathing/ Shower/ Hygiene: May shower; gently cleanse wound with antibacterial soap, rinse and pat dry prior to dressing wounds No tub bath. Anesthetic (Use 'Patient Medications' Section for Anesthetic Order Entry): Lidocaine applied to wound bed Edema Control - Lymphedema / Segmental Compressive Device / Other: Patient to wear own Velcro compression garment. Remove compression stockings every night before going to bed and put on every morning when getting up. - patient needs juxtalite or equivalent velcro wrap bilat lower extremities due to chronic venous insufficiency and stage 3 lympthedema Compression Pump: Use compression pump on left lower extremity for 60 minutes, twice daily. Compression Pump: Use compression pump on right lower extremity for 60 minutes, twice daily. DO YOUR BEST to sleep in the bed at night. DO NOT sleep in your recliner. Long hours of sitting in a recliner leads to swelling of the legs and/or potential wounds on your backside. Off-Loading: Offloading felt to foot. - donut cut in offloading felt Other: - front off-loader left Additional Orders / Instructions: Follow Nutritious Diet and Increase Protein Intake Activity as tolerated The following medication(s) was prescribed: amoxicillin-pot  clavulanate oral 875 mg-125 mg tablet 1 1 tablet oral taken 2 times per day for 14 days starting 03/29/2022 WOUND #2: - Toe Great Wound Laterality: Plantar, Left Primary Dressing: Hydrofera Blue Ready Transfer Foam, 4x5 (in/in) 1 x Per Day/15 Days Discharge Instructions: double layer cut to size of wound Secondary Dressing: Gauze 1 x Per Day/15 Days Discharge Instructions: As directed: dry, moistened with saline or moistened with Dakins Solution Secured With: Medipore Tape - 81M Medipore H Soft Cloth Surgical Tape, 2x2 (in/yd) 1 x Per Day/15 Days 1. Based on what I am seeing, going to send in a prescription for Augmentin which I think is good to be the best way to go. The patient is in agreement with this plan. 2. I am also can recommend that we have the patient continue to monitor for any signs of worsening or infection. If anything changes he should let me know soon as possible. 3. I am going to recommend he continue with the [facility] and I think we do need to make sure to take this in the undermining area very lightly. We will see patient back for reevaluation in 1 week here in the clinic. If anything worsens or changes patient will contact our office for additional recommendations. Electronic Signature(s) Signed: 03/29/2022 4:12:38 PM By:  Linwood Dibbles, Adamarys Shall PA-C Entered By: Lenda Kelp on 03/29/2022 16:12:37 Gene Brown ([id number]) -------------------------------------------------------------------------------- SuperBill Details Patient Name: Gene Brown Date of Service: 03/29/2022 Medical Record Number: [id number] Patient Account Number: [id number] Date of Birth/Sex: 04/26/1951 (71 y.o. M) Treating RN: Yevonne Pax Primary Care Provider: Barbette Reichmann Other Clinician: Referring Provider: Barbette Reichmann Treating Provider/Extender: Rowan Blase in Treatment: 11 Diagnosis Coding ICD-10 Codes Code Description E11.621 Type 2 diabetes mellitus with foot  ulcer 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osed E11.42 Type 2 diabetes mellitus with diabetic polyneuropathy I10 Essential (primary) hypertension Facility Procedures CPT4 Code: 75916384 Description: 11042 - DEB SUBQ TISSUE 20 SQ CM/< Modifier: Quantity: 1 CPT4 Code: Description: ICD-10 Diagnosis Description 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 Modifier: osed Quantity: Physician Procedures CPT4 Code: 6659935 Description: 99214 - WC PHYS LEVEL 4 - EST PT Modifier: 25 Quantity: 1 CPT4 Code: Description: ICD-10 Diagnosis Description E11.621 Type 2 diabetes mellitus with foot ulcer 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 E11.42 Type 2 diabetes mellitus with diabetic polyneuropathy I10 Essential  (primary) hypertension Modifier: osed Quantity: CPT4 Code: [id number] Description: 11042 - WC PHYS SUBQ TISS 20 SQ CM Modifier: Quantity: 1 CPT4 Code: Description: ICD-10 Diagnosis Description 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 Modifier: osed Quantity: Electronic Signature(s) Signed: 03/29/2022 4:12:59 PM By: Lenda Kelp PA-C Entered By: Lenda Kelp on 03/29/2022 16:12:59

## 2022-04-04 ENCOUNTER — Encounter: Payer: Medicare Other | Admitting: Physician Assistant

## 2022-04-04 DIAGNOSIS — E11621 Type 2 diabetes mellitus with foot ulcer: Secondary | ICD-10-CM | POA: Diagnosis not present

## 2022-04-04 NOTE — Progress Notes (Addendum)
Gene Brown, Gene V. ([id number]) Visit Report for 04/04/2022 Chief Complaint Document Details Patient Name: Gene Brown, Gene V. Date of Service: 04/04/2022 10:15 AM Medical Record Number: [id number] Patient Account Number: [id number] Date of Birth/Sex: 10/13/1950 (71 y.o. M) Treating RN: Yevonne PaxEpps, Carrie Primary Care Provider: Barbette ReichmannHande, Vishwanath Other Clinician: Referring Provider: Barbette ReichmannHande, Vishwanath Treating Provider/Extender: Rowan BlaseStone, Karole Oo Weeks in Treatment: 12 Information Obtained from: Patient Chief Complaint Left great toe ulcer Electronic Signature(s) Signed: 04/04/2022 10:15:37 AM By: Lenda KelpStone III, Deavin Forst PA-C Entered By: Lenda KelpStone III, Tierra Divelbiss on 04/04/2022 10:15:37 Gene Brown, Gene V. ([id number]) -------------------------------------------------------------------------------- Debridement Details Patient Name: Gene Brown, Gene V. Date of Service: 04/04/2022 10:15 AM Medical Record Number: [id number] Patient Account Number: [id number] Date of Birth/Sex: 11/13/1950 (71 y.o. M) Treating RN: Yevonne PaxEpps, Carrie Primary Care Provider: Barbette ReichmannHande, Vishwanath Other Clinician: Referring Provider: Barbette ReichmannHande, Vishwanath Treating Provider/Extender: Rowan BlaseStone, Ikechukwu Cerny Weeks in Treatment: 12 Debridement Performed for Wound #2 Left,Plantar Toe Great Assessment: Performed By: Physician Nelida MeuseStone, Jeannette Maddy E., PA-C Debridement Type: Debridement Severity of Tissue Pre Debridement: Fat layer exposed Level of Consciousness (Pre- Awake and Alert procedure): Pre-procedure Verification/Time Out Yes - 10:40 Taken: Start Time: 10:40 Total Area Debrided (L x W): 2 (cm) x 2 (cm) = 4 (cm) Tissue and other material Viable, Non-Viable, Slough, Subcutaneous, Skin: Dermis , Skin: Epidermis, Biofilm, Slough debrided: Level: Skin/Subcutaneous Tissue Debridement Description: Excisional Instrument: Curette Bleeding: Moderate Hemostasis Achieved: Pressure End Time: 10:44 Procedural Pain: 0 Post Procedural Pain: 0 Response to Treatment: Procedure was  tolerated well Level of Consciousness (Post- Awake and Alert procedure): Post Debridement Measurements of Total Wound Length: (cm) 1 Width: (cm) 0.6 Depth: (cm) 0.2 Volume: (cm) 0.094 Character of Wound/Ulcer Post Debridement: Improved Severity of Tissue Post Debridement: Fat layer exposed Post Procedure Diagnosis Same as Pre-procedure Electronic Signature(s) Signed: 04/04/2022 4:28:34 PM By: Yevonne PaxEpps, Carrie RN Signed: 04/04/2022 5:17:38 PM By: Lenda KelpStone III, Sanyia Dini PA-C Entered By: Yevonne PaxEpps, Carrie on 04/04/2022 10:43:00 Gene Brown, Gene V. ([id number]) -------------------------------------------------------------------------------- HPI Details Patient Name: Gene Brown, Gene V. Date of Service: 04/04/2022 10:15 AM Medical Record Number: [id number] Patient Account Number: [id number] Date of Birth/Sex: 09/15/1950 (71 y.o. M) Treating RN: Yevonne PaxEpps, Carrie Primary Care Provider: Barbette ReichmannHande, Vishwanath Other Clinician: Referring Provider: Barbette ReichmannHande, Vishwanath Treating Provider/Extender: Rowan BlaseStone, Montavis Schubring Weeks in Treatment: 12 History of Present Illness HPI Description: 05/24/16; this is a 71 year old type II diabetic who probably has some degree of polyneuropathy. He tells me that he has had an area on the plantar aspect of his left great toe that is been followed by podiatry for most the last year. He has a lot of callus, the podiatrist which shave this down put a dressing on and he would follow pad monthly or sometimes longer intervals. He has not noted any drainage or pain. He has not had a recent x-ray. No serious attempt at offloading this with modified footwear.. Last hemoglobin A1c that he knows about was over 10 a month ago he is working hard on this with his primary physician. More recently he has been soaking this with peroxide ABIs are noncompressible and the left leg. He was told by his podiatrist he has a bone spur underneath the wound but did not wish any surgical procedures. He has a caregiver at home for a  very disabled wife, the nature of her illness is not certain 05/31/16 patient comes in much the same as last week amounts of callus nonviable subcutaneous tissue which required debridement. His x-ray suggested cortical bone irregularity. He will need an MRI. He tells me he has old screws/hardware left knee however  he thinks he has had a subsequent MRI after this 06/07/16 at this point in time patient has been attempting to offload his wound as much as he could on his own. He has made a conscious effort to avoid placing any pressure on the toe as he was moving around and walking and I do believe this has payed off and what I am seeing at this point in time. overall the wound appears to be somewhat improved in my opinion he is not having any significant discomfort at this point in time. In fact he tells me he has no pain at all. There is no interval sign of surrounding infection in the wound itself has actually decreased in size. Unfortunately the MRI was not ordered last week and therefore we do need to proceed with the MRI order today. again this is to evaluate the cortical bone irregularity noted on x-ray. 06/14/16 Patient has continued to use the offloading shoe at this point in time. He tells me this is somewhat difficult to get used to walking and but nonetheless he is very strict about wearing it. Again I believe this has paid off very well for him and his wound appears to be greatly improved. He does have some callused area surrounding the wound proximally that has caused some fluid collection of maceration of the callused area. This likely will require debridement today. Some adherent slough noted over the wound bed. 06/21/16; patient's wound on the plantar aspect of his left great toe is totally epithelialized. His MRI of the foot is tomorrow to follow-up on the cortical irregularity noted on plain x-ray. He does not have diabetic foot where 06-28-16-He comes in today with a continued healed  left great toe. MRI performed on 06/23/2016 shows no abscess osteomyelitis or septic joint. Patient was discharged from wound care center today. Readmission: 01-07-2022 upon evaluation today patient appears to be doing somewhat poorly in regard to total ulcer on his left foot. Fortunately there does not appear to be any signs of infection which is great news. Has been seen by Dr. Excell SeltzerBaker and he did request from Dr. Excell SeltzerBaker to come see us due to the fact that in the past several years back we were able to get one of his wounds healed about 4 weeks. Again I explained that that is always variable but nonetheless I definitely think we can help. Fortunately I see no evidence of infection. He does have a foot offloading shoe which I think is good to attempt for now he was able to get this healed before with a peg assist. Patient does have a history of type 2 diabetes, diabetic polyneuropathy, and hypertension. This current wound started on 12-09-2021. 01-20-2022 upon evaluation today patient appears to be doing poorly in regard to his great toe. I am actually very concerned based on what I am seeing here at this point about how things are progressing. I do not believe there is any evidence of infection systemically that is obvious but locally I am concerned about the possibility this could even be an osteomyelitis type situation. 01-28-2022 upon evaluation today patient appears to be doing well with regard to his toe ulcer compared to what we were previous. Fortunately I do not see any signs of active infection at this time which is great news. No fevers, chills, nausea, vomiting, or diarrhea. With that being said the patient has not started his Augmentin yet which I definitely think he needs to get on board ASAP. I do see  some signs of new skin growth I think he is using the front offloading shoe which is good for the time being. 02-04-2022 upon evaluation today patient appears to be doing okay in regard to his toe  ulcer this is actually measuring a little bit smaller but still has a bit of callus buildup but not as much as we have seen in the past. Fortunately I do not see any evidence of active infection locally or systemically which is great news. No fevers, chills, nausea, vomiting, or diarrhea. Marland Kitchen 02-14-2022 upon evaluation today patient's wound is actually showing signs of good improvement which is great news. Fortunately I do not see any evidence of active infection locally or systemically which is great news as well. No fevers, chills, nausea, vomiting, or diarrhea. 02-21-2022 upon evaluation today patient's toe ulcer actually is showing signs of excellent improvement. I do not see any evidence of infection locally or systemically which is great news and overall I am extremely pleased with where we stand today. I do not see any signs of active infection locally or systemically which is great news. No fevers, chills, nausea, vomiting, or diarrhea. 7/24; plantar left great toe no real improvement since last week. Using silver alginate gauze. He has a forefoot offloading boot. He also has lymphedema and uses his compression pumps I do not think this is adding any particular difficulties. It sounds as though he is still pretty active 03-07-2022 upon evaluation today patient appears to be doing excellent in regard to the wound compared to previous pictures. He still has a quite deep wound on the toe good news is the x-ray did not show any signs of osteomyelitis which is excellent. He does need some debridement today. 03-14-2022 upon evaluation today patient appears to be doing well currently in regard to his wound. He has actually been tolerating the dressing changes without complication. With that being said the dressing seems to either be draining a lot or getting very wet otherwise I think we need to have him change this more frequently. MCGWIRE, DASARO ([id number]) 03-21-2022 upon evaluation today patient  appears to be doing well with regard to his toe ulcer I am actually seeing signs of improvement which is great news. This is slow but nonetheless the skin around the edges of the wound are definitely improving as we proceed here. I do not see any evidence of active infection locally or systemically which is great news. 03-29-2022 upon evaluation today patient appears to be doing fairly well except for with regard to his toe being somewhat red. I do think that we may want to place him back on the Augmentin which was previously beneficial for him. He is not in disagreement with this. Otherwise I did perform some debridement of clearway some of the necrotic debris today he seems to be tolerating this without complication. 04-04-2022 upon evaluation today patient unfortunately is continued to have significant issues here with his toe ulcer. He actually has an abscess more proximal on the dorsal aspect of the toe which is new since last week. Subsequently he is on antibiotics already I think we need to proceed with an MRI to further evaluate this. Electronic Signature(s) Signed: 04/04/2022 5:02:52 PM By: Lenda Kelp PA-C Entered By: Lenda Kelp on 04/04/2022 17:02:51 Gene Brown ([id number]) -------------------------------------------------------------------------------- Physical Exam Details Patient Name: Gene Brown Date of Service: 04/04/2022 10:15 AM Medical Record Number: [id number] Patient Account Number: [id number] Date of Birth/Sex: [birth year] (71 y.o. M) Treating RN:  Yevonne Pax Primary Care Provider: Barbette Reichmann Other Clinician: Referring Provider: Barbette Reichmann Treating Provider/Extender: Rowan Blase in Treatment: 12 Constitutional Well-nourished and well-hydrated in no acute distress. Respiratory normal breathing without difficulty. Psychiatric this patient is able to make decisions and demonstrates good insight into disease process. Alert and Oriented  x 3. pleasant and cooperative. Notes Upon inspection patient's wound actually showed signs of good granulation though there was some slough buildup I did perform debridement to clear this away as well as some of the callus. I was able to evacuate the abscess area which the patient tolerated without complication today and is much as I could clean this out along with the debridement in regard to the toe in general the patient did not seem to have any significant discomfort which was good news. Electronic Signature(s) Signed: 04/04/2022 5:05:37 PM By: Lenda Kelp PA-C Entered By: Lenda Kelp on 04/04/2022 17:05:37 Gene Brown ([id number]) -------------------------------------------------------------------------------- Physician Orders Details Patient Name: Gene Brown Date of Service: 04/04/2022 10:15 AM Medical Record Number: [id number] Patient Account Number: [id number] Date of Birth/Sex: [birth year] (71 y.o. M) Treating RN: Yevonne Pax Primary Care Provider: Barbette Reichmann Other Clinician: Referring Provider: Barbette Reichmann Treating Provider/Extender: Rowan Blase in Treatment: 12 Verbal / Phone Orders: No Diagnosis Coding ICD-10 Coding Code Description E11.621 Type 2 diabetes mellitus with foot ulcer 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osed E11.42 Type 2 diabetes mellitus with diabetic polyneuropathy I10 Essential (primary) hypertension Follow-up Appointments Wound #2 Left,Plantar Toe Great o Return Appointment in 1 week. o Nurse Visit as needed Bathing/ Shower/ Hygiene o May shower; gently cleanse wound with antibacterial soap, rinse and pat dry prior to dressing wounds o No tub bath. Anesthetic (Use 'Patient Medications' Section for Anesthetic Order Entry) o Lidocaine applied to wound bed Edema Control - Lymphedema / Segmental Compressive Device / Other o Patient to wear own Velcro compression  garment. Remove compression stockings every night before going to bed and put on every morning when getting up. - patient needs juxtalite or equivalent velcro wrap bilat lower extremities due to chronic venous insufficiency and stage 3 lympthedema o Compression Pump: Use compression pump on left lower extremity for 60 minutes, twice daily. o Compression Pump: Use compression pump on right lower extremity for 60 minutes, twice daily. o DO YOUR BEST to sleep in the bed at night. DO NOT sleep in your recliner. Long hours of sitting in a recliner leads to swelling of the legs and/or potential wounds on your backside. Off-Loading o Offloading felt to foot. - donut cut in offloading felt o Other: - front off-loader left Additional Orders / Instructions o Follow Nutritious Diet and Increase Protein Intake o Activity as tolerated Wound Treatment Wound #2 - Toe Great Wound Laterality: Plantar, Left Primary Dressing: Hydrofera Blue Ready Transfer Foam, 4x5 (in/in) 1 x Per Day/15 Days Discharge Instructions: double layer cut to size of wound Secondary Dressing: Gauze 1 x Per Day/15 Days Discharge Instructions: As directed: dry, moistened with saline or moistened with Dakins Solution Secured With: Medipore Tape - 47M Medipore H Soft Cloth Surgical Tape, 2x2 (in/yd) 1 x Per Day/15 Days Radiology o MRI with and without Contrast left foot - non healing wound left foot - (ICD10 E11.621 - Type 2 diabetes mellitus with foot ulcer) Electronic Signature(s) Gene Brown, Gene Brown ([id number]) Signed: 04/04/2022 4:28:34 PM By: Yevonne Pax RN Signed: 04/04/2022 5:17:38 PM By: Lenda Kelp PA-C Entered By: Yevonne Pax  on 04/04/2022 10:46:31 Gene Brown, Gene Brown ([id number]) -------------------------------------------------------------------------------- Prescription 04/04/2022 Patient Name: Gene Brown Provider: Allen Derry PA-C Date of Birth: 1950/09/07 NPI#: [id number] Sex: [name] DEA#:  [id number] Phone #: [phone] License #: Patient Address: [facility] [address] [facility] [address] [facility] [place], [place] [address] [address] [place], [place] [address] [phone] Allergies Statins-Hmg-Coa Reductase Inhibitors Provider's Orders Patient to wear own Velcro compression garment. Remove compression stockings every night before going to bed and put on every morning when getting up. - patient needs juxtalite or equivalent velcro wrap bilat lower extremities due to chronic venous insufficiency and stage 3 lympthedema Hand Signature: Date(s): Electronic Signature(s) Signed: 04/04/2022 4:28:34 PM By: Yevonne Pax RN Signed: 04/04/2022 5:17:38 PM By: Lenda Kelp PA-C Entered By: Yevonne Pax on 04/04/2022 10:46:32 Gene Brown ([id number]) --------------------------------------------------------------------------------  Problem List Details Patient Name: Gene Brown Date of Service: 04/04/2022 10:15 AM Medical Record Number: [id number] Patient Account Number: [id number] Date of Birth/Sex: 01-19-1951 (71 y.o. M) Treating RN: Yevonne Pax Primary Care Provider: Barbette Reichmann Other Clinician: Referring Provider: Barbette Reichmann Treating Provider/Extender: Rowan Blase in Treatment: 12 Active Problems ICD-10 Encounter Code Description Active Date MDM Diagnosis E11.621 Type 2 diabetes mellitus with foot ulcer 01/07/2022 No Yes L97.522 Non-pressure chronic ulcer of other part of left foot with fat layer 01/07/2022 No Yes exposed E11.42 Type 2 diabetes mellitus with diabetic polyneuropathy 01/07/2022 No Yes I10 Essential (primary) hypertension 01/07/2022 No Yes Inactive Problems Resolved Problems Electronic Signature(s) Signed: 04/04/2022 10:15:34 AM By: Lenda Kelp PA-C Entered By: Lenda Kelp on 04/04/2022 10:15:33 Gene Brown  ([id number]) -------------------------------------------------------------------------------- Progress Note Details Patient Name: Gene Brown Date of Service: 04/04/2022 10:15 AM Medical Record Number: [id number] Patient Account Number: [id number] Date of Birth/Sex: [birth year] (71 y.o. M) Treating RN: Yevonne Pax Primary Care Provider: Barbette Reichmann Other Clinician: Referring Provider: Barbette Reichmann Treating Provider/Extender: Rowan Blase in Treatment: 12 Subjective Chief Complaint Information obtained from Patient Left great toe ulcer History of Present Illness (HPI) 05/24/16; this is a 71 year old type II diabetic who probably has some degree of polyneuropathy. He tells me that he has had an area on the plantar aspect of his left great toe that is been followed by podiatry for most the last year. He has a lot of callus, the podiatrist which shave this down put a dressing on and he would follow pad monthly or sometimes longer intervals. He has not noted any drainage or pain. He has not had a recent x-ray. No serious attempt at offloading this with modified footwear.. Last hemoglobin A1c that he knows about was over 10 a month ago he is working hard on this with his primary physician. More recently he has been soaking this with peroxide ABIs are noncompressible and the left leg. He was told by his podiatrist he has a bone spur underneath the wound but did not wish any surgical procedures. He has a caregiver at home for a very disabled wife, the nature of her illness is not certain 05/31/16 patient comes in much the same as last week amounts of callus nonviable subcutaneous tissue which required debridement. His x-ray suggested cortical bone irregularity. He will need an MRI. He tells me he has old screws/hardware left knee however he thinks he has had a subsequent MRI after this 06/07/16 at this point in time patient has been attempting to offload his wound as much as he  could on his own. He  has made a conscious effort to avoid placing any pressure on the toe as he was moving around and walking and I do believe this has payed off and what I am seeing at this point in time. overall the wound appears to be somewhat improved in my opinion he is not having any significant discomfort at this point in time. In fact he tells me he has no pain at all. There is no interval sign of surrounding infection in the wound itself has actually decreased in size. Unfortunately the MRI was not ordered last week and therefore we do need to proceed with the MRI order today. again this is to evaluate the cortical bone irregularity noted on x-ray. 06/14/16 Patient has continued to use the offloading shoe at this point in time. He tells me this is somewhat difficult to get used to walking and but nonetheless he is very strict about wearing it. Again I believe this has paid off very well for him and his wound appears to be greatly improved. He does have some callused area surrounding the wound proximally that has caused some fluid collection of maceration of the callused area. This likely will require debridement today. Some adherent slough noted over the wound bed. 06/21/16; patient's wound on the plantar aspect of his left great toe is totally epithelialized. His MRI of the foot is tomorrow to follow-up on the cortical irregularity noted on plain x-ray. He does not have diabetic foot where 06-28-16-He comes in today with a continued healed left great toe. MRI performed on 06/23/2016 shows no abscess osteomyelitis or septic joint. Patient was discharged from wound care center today. Readmission: 01-07-2022 upon evaluation today patient appears to be doing somewhat poorly in regard to total ulcer on his left foot. Fortunately there does not appear to be any signs of infection which is great news. Has been seen by Dr. Excell Seltzer and he did request from Dr. Excell Seltzer to come see [place] due to the fact that in  the past several years back we were able to get one of his wounds healed about 4 weeks. Again I explained that that is always variable but nonetheless I definitely think we can help. Fortunately I see no evidence of infection. He does have a foot offloading shoe which I think is good to attempt for now he was able to get this healed before with a peg assist. Patient does have a history of type 2 diabetes, diabetic polyneuropathy, and hypertension. This current wound started on 12-09-2021. 01-20-2022 upon evaluation today patient appears to be doing poorly in regard to his great toe. I am actually very concerned based on what I am seeing here at this point about how things are progressing. I do not believe there is any evidence of infection systemically that is obvious but locally I am concerned about the possibility this could even be an osteomyelitis type situation. 01-28-2022 upon evaluation today patient appears to be doing well with regard to his toe ulcer compared to what we were previous. Fortunately I do not see any signs of active infection at this time which is great news. No fevers, chills, nausea, vomiting, or diarrhea. With that being said the patient has not started his Augmentin yet which I definitely think he needs to get on board ASAP. I do see some signs of new skin growth I think he is using the front offloading shoe which is good for the time being. 02-04-2022 upon evaluation today patient appears to be doing okay in  regard to his toe ulcer this is actually measuring a little bit smaller but still has a bit of callus buildup but not as much as we have seen in the past. Fortunately I do not see any evidence of active infection locally or systemically which is great news. No fevers, chills, nausea, vomiting, or diarrhea. Marland Kitchen 02-14-2022 upon evaluation today patient's wound is actually showing signs of good improvement which is great news. Fortunately I do not see any evidence of active  infection locally or systemically which is great news as well. No fevers, chills, nausea, vomiting, or diarrhea. 02-21-2022 upon evaluation today patient's toe ulcer actually is showing signs of excellent improvement. I do not see any evidence of infection locally or systemically which is great news and overall I am extremely pleased with where we stand today. I do not see any signs of active infection locally or systemically which is great news. No fevers, chills, nausea, vomiting, or diarrhea. 7/24; plantar left great toe no real improvement since last week. Using silver alginate gauze. He has a forefoot offloading boot. He also has lymphedema and uses his compression pumps I do not think this is adding any particular difficulties. It sounds as though he is still pretty active 03-07-2022 upon evaluation today patient appears to be doing excellent in regard to the wound compared to previous pictures. He still has a quite PREET, PERRIER. ([id number]) deep wound on the toe good news is the x-ray did not show any signs of osteomyelitis which is excellent. He does need some debridement today. 03-14-2022 upon evaluation today patient appears to be doing well currently in regard to his wound. He has actually been tolerating the dressing changes without complication. With that being said the dressing seems to either be draining a lot or getting very wet otherwise I think we need to have him change this more frequently. 03-21-2022 upon evaluation today patient appears to be doing well with regard to his toe ulcer I am actually seeing signs of improvement which is great news. This is slow but nonetheless the skin around the edges of the wound are definitely improving as we proceed here. I do not see any evidence of active infection locally or systemically which is great news. 03-29-2022 upon evaluation today patient appears to be doing fairly well except for with regard to his toe being somewhat red. I do think  that we may want to place him back on the Augmentin which was previously beneficial for him. He is not in disagreement with this. Otherwise I did perform some debridement of clearway some of the necrotic debris today he seems to be tolerating this without complication. 04-04-2022 upon evaluation today patient unfortunately is continued to have significant issues here with his toe ulcer. He actually has an abscess more proximal on the dorsal aspect of the toe which is new since last week. Subsequently he is on antibiotics already I think we need to proceed with an MRI to further evaluate this. Objective Constitutional Well-nourished and well-hydrated in no acute distress. Vitals Time Taken: 10:18 AM, Height: 68 in, Weight: 217 lbs, BMI: 33, Temperature: 98 F, Pulse: 81 bpm, Respiratory Rate: 18 breaths/min, Blood Pressure: 131/79 mmHg. Respiratory normal breathing without difficulty. Psychiatric this patient is able to make decisions and demonstrates good insight into disease process. Alert and Oriented x 3. pleasant and cooperative. General Notes: Upon inspection patient's wound actually showed signs of good granulation though there was some slough buildup I did perform debridement to clear  this away as well as some of the callus. I was able to evacuate the abscess area which the patient tolerated without complication today and is much as I could clean this out along with the debridement in regard to the toe in general the patient did not seem to have any significant discomfort which was good news. Integumentary (Hair, Skin) Wound #2 status is Open. Original cause of wound was Gradually Appeared. The date acquired was: 12/09/2021. The wound has been in treatment 12 weeks. The wound is located on the [employer]. The wound measures 1cm length x 0.6cm width x 0.2cm depth; 0.471cm^2 area and 0.094cm^3 volume. There is Fat Layer (Subcutaneous Tissue) exposed. There is no tunneling or  undermining noted. There is a medium amount of serosanguineous drainage noted. The wound margin is flat and intact. There is large (67-100%) pink, pale granulation within the wound bed. There is a small (1-33%) amount of necrotic tissue within the wound bed including Adherent Slough. 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 There was a Excisional Skin/Subcutaneous Tissue Debridement with a total area of 4 sq cm performed by Nelida Meuse., PA-C. With the following instrument(s): Curette to remove Viable and Non-Viable tissue/material. Material removed includes Subcutaneous Tissue, Slough, Skin: Dermis, Skin: Epidermis, and Biofilm. No specimens were taken. A time out was conducted at 10:40, prior to the start of the procedure. A Moderate amount of bleeding was controlled with Pressure. The procedure was tolerated well with a pain level of 0 throughout and a pain level of 0 following the procedure. Post Debridement Measurements: 1cm length x 0.6cm width x 0.2cm depth; 0.094cm^3 volume. Character of Wound/Ulcer Post Debridement is improved. Severity of Tissue Post Debridement is: Fat layer exposed. Post procedure Diagnosis Wound #2: Same as Pre-Procedure Plan Follow-up Appointments: Wound #2 Left,Plantar Toe Great: Return Appointment in 1 week. Nurse Visit as needed Bathing/ Shower/ Hygiene: May shower; gently cleanse wound with antibacterial soap, rinse and pat dry prior to dressing wounds No tub bath. Anesthetic (Use 'Patient Medications' Section for Anesthetic  Order Entry): Lidocaine applied to wound bed Edema Control - Lymphedema / Segmental Compressive Device / Other: Patient to wear own Velcro compression garment. Remove compression stockings every night before going to bed and put on every morning when getting up. - patient needs juxtalite or equivalent velcro wrap bilat lower extremities due to chronic venous insufficiency and stage 3 lympthedema Compression Pump: Use compression pump on left lower extremity for 60 minutes, twice daily. Compression Pump: Use compression pump on right lower extremity for 60 minutes, twice daily. DO YOUR BEST to sleep in the bed at night. DO NOT sleep in your recliner. Long hours of sitting in a recliner leads to swelling of the legs and/or potential wounds on your backside. Off-Loading: Offloading felt to foot. - donut cut in offloading felt Other: - front off-loader left Additional Orders / Instructions: Follow Nutritious Diet and Increase Protein Intake Activity as tolerated Radiology ordered were: MRI with and without Contrast left foot - non healing wound left foot WOUND #2: - Toe Great Wound Laterality: Plantar, Left Primary Dressing: Hydrofera Blue Ready Transfer Foam,  4x5 (in/in) 1 x Per Day/15 Days Discharge Instructions: double layer cut to size of wound Secondary Dressing: Gauze 1 x Per Day/15 Days Discharge Instructions: As directed: dry, moistened with saline or moistened with Dakins Solution Secured With: Medipore Tape - 71M Medipore H Soft Cloth Surgical Tape, 2x2 (in/yd) 1 x Per Day/15 Days 1. Based on what I am seeing I would recommend he continue with the antibiotics. 2. I am also going to suggest that the patient should continue to monitor for any signs of worsening or infection. We have been using the [facility] which I think is doing well on the plantar aspect would use a little bit of Hydrofera Blue rope in the dorsal aspect to try to keep this open and draining. 3. I am also can  recommend that we go ahead and proceed with an MRI and I think that we need to get this done in order to evaluate for any signs of osteomyelitis especially with the new finding today. We will see patient back for reevaluation in 1 week here in the clinic. If anything worsens or changes patient will contact our office for additional recommendations. Electronic Signature(s) Signed: 04/04/2022 5:06:13 PM By: Lenda Kelp PA-C Entered By: Lenda Kelp on 04/04/2022 17:06:13 Gene Brown ([id number]) -------------------------------------------------------------------------------- SuperBill Details Patient Name: Gene Brown Date of Service: 04/04/2022 Medical Record Number: [id number] Patient Account Number: [id number] Date of Birth/Sex: [birth year] (71 y.o. M) Treating RN: Yevonne Pax Primary Care Provider: Barbette Reichmann Other Clinician: Referring Provider: Barbette Reichmann Treating Provider/Extender: Rowan Blase in Treatment: 12 Diagnosis Coding ICD-10 Codes Code Description E11.621 Type 2 diabetes mellitus with foot ulcer 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osed E11.42 Type 2 diabetes mellitus with diabetic polyneuropathy I10 Essential (primary) hypertension Facility Procedures CPT4 Code: 13244010 Description: 11042 - DEB SUBQ TISSUE 20 SQ CM/< Modifier: Quantity: 1 CPT4 Code: Description: ICD-10 Diagnosis Description 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 Modifier: osed Quantity: Physician Procedures CPT4 Code: 2725366 Description: 99214 - WC PHYS LEVEL 4 - EST PT Modifier: 25 Quantity: 1 CPT4 Code: Description: ICD-10 Diagnosis Description E11.621 Type 2 diabetes mellitus with foot ulcer 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 E11.42 Type 2 diabetes mellitus with diabetic polyneuropathy I10 Essential  (primary)  hypertension Modifier: osed Quantity: CPT4 Code: 4403474 Description: 11042 - WC PHYS SUBQ TISS 20 SQ CM Modifier: Quantity: 1 CPT4 Code: Description: ICD-10 Diagnosis Description 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 Modifier: osed Quantity: Electronic Signature(s) Signed: 04/04/2022 5:16:16 PM By: Lenda Kelp PA-C Entered By: Lenda Kelp on 04/04/2022 17:16:16

## 2022-04-04 NOTE — Progress Notes (Addendum)
BHARATH, BERNSTEIN ([id number]) Visit Report for 04/04/2022 Arrival Information Details Patient Name: Gene, Brown Date of Service: 04/04/2022 10:15 AM Medical Record Number: [id number] Patient Account Number: [id number] Date of Birth/Sex: [birth year] (71 y.o. M) Treating RN: Carlene Coria Primary Care Jackye Dever: Tracie Harrier Other Clinician: Referring Maty Zeisler: Tracie Harrier Treating Merit Maybee/Extender: Skipper Cliche in Treatment: 12 Visit Information History Since Last Visit All ordered tests and consults were completed: No Patient Arrived: Gene Brown Added or deleted any medications: No Arrival Time: 10:13 Any new allergies or adverse reactions: No Accompanied By: self Had a fall or experienced change in No Transfer Assistance: None activities of daily living that may affect Patient Identification Verified: Yes risk of falls: Secondary Verification Process Completed: Yes Signs or symptoms of abuse/neglect since last visito No Patient Has Alerts: Yes Hospitalized since last visit: No Patient Alerts: Patient on Blood Thinner Implantable device outside of the clinic excluding No 01/07/2022 [place]>220 on left cellular tissue based products placed in the center since last visit: Has Dressing in Place as Prescribed: Yes Pain Present Now: No Electronic Signature(s) Signed: 04/04/2022 4:28:34 PM By: Carlene Coria RN Entered By: Carlene Coria on 04/04/2022 10:18:22 Gene Brown ([id number]) -------------------------------------------------------------------------------- Clinic Level of Care Assessment Details Patient Name: Gene Brown Date of Service: 04/04/2022 10:15 AM Medical Record Number: [id number] Patient Account Number: [id number] Date of Birth/Sex: [birth year] (71 y.o. M) Treating RN: Carlene Coria Primary Care Marketa Midkiff: Tracie Harrier Other Clinician: Referring Zayne Marovich: Tracie Harrier Treating Fanchon Papania/Extender: Skipper Cliche in Treatment: 12 Clinic Level  of Care Assessment Items TOOL 1 Quantity Score _0  - Use when EandM and Procedure is performed on INITIAL visit 0 ASSESSMENTS - Nursing Assessment / Reassessment _1  - General Physical Exam (combine w/ comprehensive assessment (listed just below) when performed on new 0 pt. evals) _2  - 0 Comprehensive Assessment (HX, ROS, Risk Assessments, Wounds Hx, etc.) ASSESSMENTS - Wound and Skin Assessment / Reassessment _3  - Dermatologic / Skin Assessment (not related to wound area) 0 ASSESSMENTS - Ostomy and/or Continence Assessment and Care _4  - Incontinence Assessment and Management 0 _5  - 0 Ostomy Care Assessment and Management (repouching, etc.) PROCESS - Coordination of Care _6  - Simple Patient / Family Education for ongoing care 0 _7  - 0 Complex (extensive) Patient / Family Education for ongoing care _8  - 0 Staff obtains Programmer, systems, Records, Test Results / Process Orders _9  - 0 Staff telephones HHA, Nursing Homes / Clarify orders / etc _10  - 0 Routine Transfer to another Facility (non-emergent condition) _11  - 0 Routine Hospital Admission (non-emergent condition) _12  - 0 New Admissions / Biomedical engineer / Ordering NPWT, Apligraf, etc. _13  - 0 Emergency Hospital Admission (emergent condition) PROCESS - Special Needs _14  - Pediatric / Minor Patient Management 0 _15  - 0 Isolation Patient Management _16  - 0 Hearing / Language / Visual special needs _17  - 0 Assessment of Community assistance (transportation, D/C planning, etc.) _18  - 0 Additional assistance / Altered mentation _19  - 0 Support Surface(s) Assessment (bed, cushion, seat, etc.) INTERVENTIONS - Miscellaneous _20  - External ear exam 0 _21  - 0 Patient Transfer (multiple staff / Civil Service fast streamer / Similar devices) _22  - 0 Simple Staple / Suture removal (25 or less) _23  - 0 Complex Staple / Suture removal (26 or more) _24  - 0 Hypo/Hyperglycemic Management (do not check if billed separately) _25  - 0 Ankle / Brachial Index (ABI) -  do not check if billed separately Has the patient been seen at the hospital within the last three years:  Yes Total Score: 0 Level Of Care: ____ Gene Brown ([id number]) Electronic Signature(s) Signed: 04/04/2022 4:28:34 PM By: Carlene Coria RN Entered By: Carlene Coria on 04/04/2022 10:44:26 Gene Brown ([id number]) -------------------------------------------------------------------------------- Encounter Discharge Information Details Patient Name: Gene Brown Date of Service: 04/04/2022 10:15 AM Medical Record Number: [id number] Patient Account Number: [id number] Date of Birth/Sex: 23-Dec-1950 (71 y.o. M) Treating RN: Carlene Coria Primary Care Jamilia Jacques: Tracie Harrier Other Clinician: Referring Raiford Fetterman: Tracie Harrier Treating Yuliza Cara/Extender: Skipper Cliche in Treatment: 12 Encounter Discharge Information Items Post Procedure Vitals Discharge Condition: Stable Temperature (F): 98 Ambulatory Status: Ambulatory Pulse (bpm): 81 Discharge Destination: Home Respiratory Rate (breaths/min): 18 Transportation: Private Auto Blood Pressure (mmHg): 131/79 Accompanied By: self Schedule Follow-up Appointment: Yes Clinical Summary of Care: Electronic Signature(s) Signed: 04/04/2022 4:28:34 PM By: Carlene Coria RN Entered By: Carlene Coria on 04/04/2022 10:45:29 Gene Brown ([id number]) -------------------------------------------------------------------------------- Lower Extremity Assessment Details Patient Name: Gene Brown Date of Service: 04/04/2022 10:15 AM Medical Record Number: [id number] Patient Account Number: [id number] Date of Birth/Sex: [birth year] (71 y.o. M) Treating RN: Carlene Coria Primary Care Mirenda Baltazar: Tracie Harrier Other Clinician: Referring Bleu Moisan: Tracie Harrier Treating Cassadee Vanzandt/Extender: Skipper Cliche in Treatment: 12 Edema Assessment Assessed: [Left: No] [Right: No] Edema: [Left: Ye] [Right: s] Calf Left: Right: Point  of Measurement: 28 cm From Medial Instep 44 cm Ankle Left: Right: Point of Measurement: 11 cm From Medial Instep 30 cm Vascular Assessment Pulses: Dorsalis Pedis Palpable: [Left:Yes] Electronic Signature(s) Signed: 04/04/2022 4:28:34 PM By: Carlene Coria RN Entered By: Carlene Coria on 04/04/2022 10:24:40 Gene Brown ([id number]) -------------------------------------------------------------------------------- Multi Wound Chart Details Patient Name: Gene Brown Date of Service: 04/04/2022 10:15 AM Medical Record Number: [id number] Patient Account Number: [id number] Date of Birth/Sex: 1950/12/30 (71 y.o. M) Treating RN: Carlene Coria Primary Care Asami Lambright: Tracie Harrier Other Clinician: Referring Jahmir Salo: Tracie Harrier Treating Jaiyden Laur/Extender: Skipper Cliche in Treatment: 12 Vital Signs Height(in): 68 Pulse(bpm): 81 Weight(lbs): 217 Blood Pressure(mmHg): 131/79 Body Mass Index(BMI): 33 Temperature(F): 98 Respiratory Rate(breaths/min): 18 Photos: [N/A:N/A] Wound Location: Left, Plantar Toe Great N/A N/A Wounding Event: Gradually Appeared N/A N/A Primary Etiology: Diabetic Wound/Ulcer of the Lower N/A N/A Extremity Comorbid History: Cataracts, Sickle Cell Disease, Sleep N/A N/A Apnea, Hypertension, Type II Diabetes, Gout, Osteoarthritis Date Acquired: 12/09/2021 N/A N/A Weeks of Treatment: 12 N/A N/A Wound Status: Open N/A N/A Wound Recurrence: No N/A N/A Measurements L x W x D (cm) 1x0.6x0.2 N/A N/A Area (cm) : 0.471 N/A N/A Volume (cm) : 0.094 N/A N/A % Reduction in Area: -66.40% N/A N/A % Reduction in Volume: 33.30% N/A N/A Classification: Grade 2 N/A N/A Exudate Amount: Medium N/A N/A Exudate Type: Serosanguineous N/A N/A Exudate Color: red, brown N/A N/A Wound Margin: Flat and Intact N/A N/A Granulation Amount: Large (67-100%) N/A N/A Granulation Quality: Pink, Pale N/A N/A Necrotic Amount: Small (1-33%) N/A N/A Exposed Structures: Fat  Layer (Subcutaneous Tissue): N/A N/A Yes Fascia: No Tendon: No Muscle: No Joint: No Bone: No Epithelialization: None N/A N/A Treatment Notes Electronic Signature(s) Signed: 04/04/2022 4:28:34 PM By: Carlene Coria RN Entered By: Carlene Coria on 04/04/2022 10:24:55 Gene Brown ([id number] Brown ([id number]) -------------------------------------------------------------------------------- Multi-Disciplinary Care Plan Details Patient Name: Gene Brown Date of Service: 04/04/2022 10:15 AM Medical Record Number: [id number] Patient Account Number: [id number] Date of Birth/Sex: 08-06-1951 (71 y.o. M) Treating RN: Carlene Coria Primary Care Demarrio Menges: Tracie Harrier Other Clinician: Referring Remmie Bembenek: Tracie Harrier Treating Mendel Binsfeld/Extender: Skipper Cliche in Treatment: 12 Active Inactive Wound/Skin  Impairment Nursing Diagnoses: Impaired tissue integrity Knowledge deficit related to smoking impact on wound healing Goals: Patient/caregiver will verbalize understanding of skin care regimen Date Initiated: 01/07/2022 Date Inactivated: 02/21/2022 Target Resolution Date: 01/07/2022 Goal Status: Met Ulcer/skin breakdown will have a volume reduction of 30% by week 4 Date Initiated: 01/07/2022 Target Resolution Date: 02/04/2022 Goal Status: Active Ulcer/skin breakdown will have a volume reduction of 50% by week 8 Date Initiated: 01/07/2022 Target Resolution Date: 03/03/2022 Goal Status: Active Interventions: Assess patient/caregiver ability to obtain necessary supplies Assess patient/caregiver ability to perform ulcer/skin care regimen upon admission and as needed Assess ulceration(s) every visit Treatment Activities: Referred to DME Fredricka Kohrs for dressing supplies : 01/07/2022 Skin care regimen initiated : 01/07/2022 Topical wound management initiated : 01/07/2022 Notes: Electronic Signature(s) Signed: 04/04/2022 4:28:34 PM By: Carlene Coria RN Entered By: Carlene Coria on  04/04/2022 10:24:44 Gene Brown ([id number]) -------------------------------------------------------------------------------- Pain Assessment Details Patient Name: Gene Brown Date of Service: 04/04/2022 10:15 AM Medical Record Number: [id number] Patient Account Number: [id number] Date of Birth/Sex: 1950/12/27 (71 y.o. M) Treating RN: Carlene Coria Primary Care Damarie Schoolfield: Tracie Harrier Other Clinician: Referring Teiana Hajduk: Tracie Harrier Treating Lem Peary/Extender: Skipper Cliche in Treatment: 12 Active Problems Location of Pain Severity and Description of Pain Patient Has Paino No Site Locations Pain Management and Medication Current Pain Management: Electronic Signature(s) Signed: 04/04/2022 4:28:34 PM By: Carlene Coria RN Entered By: Carlene Coria on 04/04/2022 10:18:45 Gene Brown ([id number]) -------------------------------------------------------------------------------- Patient/Caregiver Education Details Patient Name: Gene Brown Date of Service: 04/04/2022 10:15 AM Medical Record Number: [id number] Patient Account Number: [id number] Date of Birth/Gender: 20-Apr-1951 (71 y.o. M) Treating RN: Carlene Coria Primary Care Physician: Tracie Harrier Other Clinician: Referring Physician: Tracie Harrier Treating Physician/Extender: Skipper Cliche in Treatment: 12 Education Assessment Education Provided To: Patient Education Topics Provided Wound/Skin Impairment: Methods: Explain/Verbal Responses: State content correctly Electronic Signature(s) Signed: 04/04/2022 4:28:34 PM By: Carlene Coria RN Entered By: Carlene Coria on 04/04/2022 10:44:41 Gene Brown ([id number]) -------------------------------------------------------------------------------- Wound Assessment Details Patient Name: Gene Brown Date of Service: 04/04/2022 10:15 AM Medical Record Number: [id number] Patient Account Number: [id number] Date of Birth/Sex: 05-08-1951 (71 y.o.  M) Treating RN: Carlene Coria Primary Care Brittinee Risk: Tracie Harrier Other Clinician: Referring Devina Bezold: Tracie Harrier Treating Kamelia Lampkins/Extender: Skipper Cliche in Treatment: 12 Wound Status Wound Number: 2 Primary Diabetic Wound/Ulcer of the Lower Extremity Etiology: Wound Location: Left, Plantar Toe Great Wound Open Wounding Event: Gradually Appeared Status: Date Acquired: 12/09/2021 Comorbid Cataracts, Sickle Cell Disease, Sleep Apnea, Weeks Of Treatment: 12 History: Hypertension, Type II Diabetes, Gout, Osteoarthritis Clustered Wound: No Photos Wound Measurements Length: (cm) 1 % Red Width: (cm) 0.6 % Red Depth: (cm) 0.2 Epith Area: (cm) 0.471 Tunn Volume: (cm) 0.094 Unde uction in Area: -66.4% uction in Volume: 33.3% elialization: None eling: No rmining: No Wound Description Classification: Grade 2 Foul Wound Margin: Flat and Intact Slou Exudate Amount: Medium Exudate Type: Serosanguineous Exudate Color: red, brown Odor After Cleansing: No gh/Fibrino Yes Wound Bed Granulation Amount: Large (67-100%) Exposed Structure Granulation Quality: Pink, Pale Fascia Exposed: No Necrotic Amount: Small (1-33%) Fat Layer (Subcutaneous Tissue) Exposed: Yes Necrotic Quality: Adherent Slough Tendon Exposed: No Muscle Exposed: No Joint Exposed: No Bone Exposed: No Treatment Notes Wound #2 (Toe Great) Wound Laterality: Plantar, Left Cleanser Peri-Wound Care Topical Gene Brown, Gene Brown ([id number]) Primary Dressing Hydrofera Blue Ready Transfer Foam, 4x5 (in/in) Discharge Instruction: double layer cut to size of wound Secondary Dressing Gauze Discharge Instruction: As directed: dry, moistened with saline or  moistened with Dakins Solution Secured With Medipore Tape - 23M Medipore H Soft Cloth Surgical Tape, 2x2 (in/yd) Compression Wrap Compression Stockings Add-Ons Electronic Signature(s) Signed: 04/04/2022 4:28:34 PM By: Carlene Coria RN Entered By: Carlene Coria on 04/04/2022 10:23:14 Gene Brown ([id number]) -------------------------------------------------------------------------------- Vitals Details Patient Name: Gene Brown Date of Service: 04/04/2022 10:15 AM Medical Record Number: [id number] Patient Account Number: [id number] Date of Birth/Sex: 10/30/1950 (71 y.o. M) Treating RN: Carlene Coria Primary Care Shanzay Hepworth: Tracie Harrier Other Clinician: Referring Tawna Alwin: Tracie Harrier Treating Neymar Dowe/Extender: Skipper Cliche in Treatment: 12 Vital Signs Time Taken: 10:18 Temperature (F): 98 Height (in): 68 Pulse (bpm): 81 Weight (lbs): 217 Respiratory Rate (breaths/min): 18 Body Mass Index (BMI): 33 Blood Pressure (mmHg): 131/79 Reference Range: 80 - 120 mg / dl Electronic Signature(s) Signed: 04/04/2022 4:28:34 PM By: Carlene Coria RN Entered By: Carlene Coria on 04/04/2022 10:18:40

## 2022-04-05 ENCOUNTER — Other Ambulatory Visit (HOSPITAL_COMMUNITY): Payer: Self-pay | Admitting: Physician Assistant

## 2022-04-05 ENCOUNTER — Other Ambulatory Visit: Payer: Self-pay | Admitting: Physician Assistant

## 2022-04-05 DIAGNOSIS — E11621 Type 2 diabetes mellitus with foot ulcer: Secondary | ICD-10-CM

## 2022-04-12 ENCOUNTER — Encounter: Payer: Medicare Other | Attending: Physician Assistant | Admitting: Physician Assistant

## 2022-04-12 DIAGNOSIS — E1142 Type 2 diabetes mellitus with diabetic polyneuropathy: Secondary | ICD-10-CM | POA: Insufficient documentation

## 2022-04-12 DIAGNOSIS — I10 Essential (primary) hypertension: Secondary | ICD-10-CM | POA: Insufficient documentation

## 2022-04-12 DIAGNOSIS — L97522 Non-pressure chronic ulcer of other part of left foot with fat layer exposed: Secondary | ICD-10-CM | POA: Diagnosis not present

## 2022-04-12 DIAGNOSIS — E11621 Type 2 diabetes mellitus with foot ulcer: Secondary | ICD-10-CM | POA: Diagnosis present

## 2022-04-12 NOTE — Progress Notes (Addendum)
LUCION, DILGER ([id number]) Visit Report for 04/12/2022 Chief Complaint Document Details Patient Name: Gene Brown, Gene Brown Date of Service: 04/12/2022 12:30 PM Medical Record Number: [id number] Patient Account Number: [id number] Date of Birth/Sex: September 04, 1950 (71 y.o. M) Treating RN: Yevonne Pax Primary Care Provider: Barbette Reichmann Other Clinician: Referring Provider: Barbette Reichmann Treating Provider/Extender: Rowan Blase in Treatment: 13 Information Obtained from: Patient Chief Complaint Left great toe ulcer Electronic Signature(s) Signed: 04/12/2022 12:47:08 PM By: Lenda Kelp PA-C Entered By: Lenda Kelp on 04/12/2022 12:47:08 Gene Brown ([id number]) -------------------------------------------------------------------------------- Debridement Details Patient Name: Gene Brown Date of Service: 04/12/2022 12:30 PM Medical Record Number: [id number] Patient Account Number: [id number] Date of Birth/Sex: July 19, 1951 (71 y.o. M) Treating RN: Yevonne Pax Primary Care Provider: Barbette Reichmann Other Clinician: Referring Provider: Barbette Reichmann Treating Provider/Extender: Rowan Blase in Treatment: 13 Debridement Performed for Wound #2 Left,Plantar Toe Great Assessment: Performed By: Physician Nelida Meuse., PA-C Debridement Type: Debridement Severity of Tissue Pre Debridement: Fat layer exposed Level of Consciousness (Pre- Awake and Alert procedure): Pre-procedure Verification/Time Out Yes - 12:50 Taken: Start Time: 12:50 Total Area Debrided (L x W): 0.1 (cm) x 3 (cm) = 0.3 (cm) Tissue and other material Viable, Non-Viable, Callus, Slough, Subcutaneous, Skin: Dermis , Skin: Epidermis, Slough debrided: Level: Skin/Subcutaneous Tissue Debridement Description: Excisional Instrument: Curette Bleeding: Minimum Hemostasis Achieved: Pressure End Time: 12:54 Procedural Pain: 0 Post Procedural Pain: 0 Response to Treatment: Procedure was  tolerated well Level of Consciousness (Post- Awake and Alert procedure): Post Debridement Measurements of Total Wound Length: (cm) 0.5 Width: (cm) 4 Depth: (cm) 0.4 Volume: (cm) 0.628 Character of Wound/Ulcer Post Debridement: Improved Severity of Tissue Post Debridement: Fat layer exposed Post Procedure Diagnosis Same as Pre-procedure Electronic Signature(s) Signed: 04/12/2022 4:56:20 PM By: Lenda Kelp PA-C Signed: 04/18/2022 8:13:04 AM By: Yevonne Pax RN Entered By: Yevonne Pax on 04/12/2022 12:53:04 Gene Brown ([id number]) -------------------------------------------------------------------------------- HPI Details Patient Name: Gene Brown Date of Service: 04/12/2022 12:30 PM Medical Record Number: [id number] Patient Account Number: [id number] Date of Birth/Sex: 06/19/1951 (71 y.o. M) Treating RN: Yevonne Pax Primary Care Provider: Barbette Reichmann Other Clinician: Referring Provider: Barbette Reichmann Treating Provider/Extender: Rowan Blase in Treatment: 13 History of Present Illness HPI Description: 05/24/16; this is a 72 year old type II diabetic who probably has some degree of polyneuropathy. He tells me that he has had an area on the plantar aspect of his left great toe that is been followed by podiatry for most the last year. He has a lot of callus, the podiatrist which shave this down put a dressing on and he would follow pad monthly or sometimes longer intervals. He has not noted any drainage or pain. He has not had a recent x-ray. No serious attempt at offloading this with modified footwear.. Last hemoglobin A1c that he knows about was over 10 a month ago he is working hard on this with his primary physician. More recently he has been soaking this with peroxide ABIs are noncompressible and the left leg. He was told by his podiatrist he has a bone spur underneath the wound but did not wish any surgical procedures. He has a caregiver at home for a  very disabled wife, the nature of her illness is not certain 05/31/16 patient comes in much the same as last week amounts of callus nonviable subcutaneous tissue which required debridement. His x-ray suggested cortical bone irregularity. He will need an MRI. He tells me he has old screws/hardware left knee however  he thinks he has had a subsequent MRI after this 06/07/16 at this point in time patient has been attempting to offload his wound as much as he could on his own. He has made a conscious effort to avoid placing any pressure on the toe as he was moving around and walking and I do believe this has payed off and what I am seeing at this point in time. overall the wound appears to be somewhat improved in my opinion he is not having any significant discomfort at this point in time. In fact he tells me he has no pain at all. There is no interval sign of surrounding infection in the wound itself has actually decreased in size. Unfortunately the MRI was not ordered last week and therefore we do need to proceed with the MRI order today. again this is to evaluate the cortical bone irregularity noted on x-ray. 06/14/16 Patient has continued to use the offloading shoe at this point in time. He tells me this is somewhat difficult to get used to walking and but nonetheless he is very strict about wearing it. Again I believe this has paid off very well for him and his wound appears to be greatly improved. He does have some callused area surrounding the wound proximally that has caused some fluid collection of maceration of the callused area. This likely will require debridement today. Some adherent slough noted over the wound bed. 06/21/16; patient's wound on the plantar aspect of his left great toe is totally epithelialized. His MRI of the foot is tomorrow to follow-up on the cortical irregularity noted on plain x-ray. He does not have diabetic foot where 06-28-16-He comes in today with a continued healed  left great toe. MRI performed on 06/23/2016 shows no abscess osteomyelitis or septic joint. Patient was discharged from wound care center today. Readmission: 01-07-2022 upon evaluation today patient appears to be doing somewhat poorly in regard to total ulcer on his left foot. Fortunately there does not appear to be any signs of infection which is great news. Has been seen by Dr. Excell SeltzerBaker and he did request from Dr. Excell SeltzerBaker to come see us due to the fact that in the past several years back we were able to get one of his wounds healed about 4 weeks. Again I explained that that is always variable but nonetheless I definitely think we can help. Fortunately I see no evidence of infection. He does have a foot offloading shoe which I think is good to attempt for now he was able to get this healed before with a peg assist. Patient does have a history of type 2 diabetes, diabetic polyneuropathy, and hypertension. This current wound started on 12-09-2021. 01-20-2022 upon evaluation today patient appears to be doing poorly in regard to his great toe. I am actually very concerned based on what I am seeing here at this point about how things are progressing. I do not believe there is any evidence of infection systemically that is obvious but locally I am concerned about the possibility this could even be an osteomyelitis type situation. 01-28-2022 upon evaluation today patient appears to be doing well with regard to his toe ulcer compared to what we were previous. Fortunately I do not see any signs of active infection at this time which is great news. No fevers, chills, nausea, vomiting, or diarrhea. With that being said the patient has not started his Augmentin yet which I definitely think he needs to get on board ASAP. I do see  some signs of new skin growth I think he is using the front offloading shoe which is good for the time being. 02-04-2022 upon evaluation today patient appears to be doing okay in regard to his toe  ulcer this is actually measuring a little bit smaller but still has a bit of callus buildup but not as much as we have seen in the past. Fortunately I do not see any evidence of active infection locally or systemically which is great news. No fevers, chills, nausea, vomiting, or diarrhea. Marland Kitchen 02-14-2022 upon evaluation today patient's wound is actually showing signs of good improvement which is great news. Fortunately I do not see any evidence of active infection locally or systemically which is great news as well. No fevers, chills, nausea, vomiting, or diarrhea. 02-21-2022 upon evaluation today patient's toe ulcer actually is showing signs of excellent improvement. I do not see any evidence of infection locally or systemically which is great news and overall I am extremely pleased with where we stand today. I do not see any signs of active infection locally or systemically which is great news. No fevers, chills, nausea, vomiting, or diarrhea. 7/24; plantar left great toe no real improvement since last week. Using silver alginate gauze. He has a forefoot offloading boot. He also has lymphedema and uses his compression pumps I do not think this is adding any particular difficulties. It sounds as though he is still pretty active 03-07-2022 upon evaluation today patient appears to be doing excellent in regard to the wound compared to previous pictures. He still has a quite deep wound on the toe good news is the x-ray did not show any signs of osteomyelitis which is excellent. He does need some debridement today. 03-14-2022 upon evaluation today patient appears to be doing well currently in regard to his wound. He has actually been tolerating the dressing changes without complication. With that being said the dressing seems to either be draining a lot or getting very wet otherwise I think we need to have him change this more frequently. GARRELL, FLAGG ([id number]) 03-21-2022 upon evaluation today patient  appears to be doing well with regard to his toe ulcer I am actually seeing signs of improvement which is great news. This is slow but nonetheless the skin around the edges of the wound are definitely improving as we proceed here. I do not see any evidence of active infection locally or systemically which is great news. 03-29-2022 upon evaluation today patient appears to be doing fairly well except for with regard to his toe being somewhat red. I do think that we may want to place him back on the Augmentin which was previously beneficial for him. He is not in disagreement with this. Otherwise I did perform some debridement of clearway some of the necrotic debris today he seems to be tolerating this without complication. 04-04-2022 upon evaluation today patient unfortunately is continued to have significant issues here with his toe ulcer. He actually has an abscess more proximal on the dorsal aspect of the toe which is new since last week. Subsequently he is on antibiotics already I think we need to proceed with an MRI to further evaluate this. 04-12-2022 upon evaluation today patient appears to be doing better in regard to his toe ulcer. This actually is less red than what it was he is about done with the initial antibiotic he does has his MRI scheduled for Friday. The reason I am going to probably extend the Augmentin for the time being  I will to make sure that we keep this under control especially if he potentially has a bone infection I do not going to be going without the antibiotic therapy. Patient is in agreement with that plan. Electronic Signature(s) Signed: 04/12/2022 12:58:24 PM By: Lenda Kelp PA-C Entered By: Lenda Kelp on 04/12/2022 12:58:23 Gene Brown ([id number]) -------------------------------------------------------------------------------- Physical Exam Details Patient Name: Gene Brown Date of Service: 04/12/2022 12:30 PM Medical Record Number: [id number] Patient  Account Number: [id number] Date of Birth/Sex: [birth year] (71 y.o. M) Treating RN: Yevonne Pax Primary Care Provider: Barbette Reichmann Other Clinician: Referring Provider: Barbette Reichmann Treating Provider/Extender: Rowan Blase in Treatment: 13 Constitutional Well-nourished and well-hydrated in no acute distress. Respiratory normal breathing without difficulty. Psychiatric this patient is able to make decisions and demonstrates good insight into disease process. Alert and Oriented x 3. pleasant and cooperative. Notes Upon inspection patient's wound bed actually showed signs of good granulation and epithelization at this point. Fortunately he does not show any signs of infection systemically which is great news and overall I am extremely pleased with where things stand here. Electronic Signature(s) Signed: 04/12/2022 12:58:44 PM By: Lenda Kelp PA-C Entered By: Lenda Kelp on 04/12/2022 12:58:43 Gene Brown ([id number]) -------------------------------------------------------------------------------- Physician Orders Details Patient Name: Gene Brown Date of Service: 04/12/2022 12:30 PM Medical Record Number: [id number] Patient Account Number: [id number] Date of Birth/Sex: 31-Jan-1951 (71 y.o. M) Treating RN: Yevonne Pax Primary Care Provider: Barbette Reichmann Other Clinician: Referring Provider: Barbette Reichmann Treating Provider/Extender: Rowan Blase in Treatment: 13 Verbal / Phone Orders: No Diagnosis Coding ICD-10 Coding Code Description E11.621 Type 2 diabetes mellitus with foot ulcer 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osed E11.42 Type 2 diabetes mellitus with diabetic polyneuropathy I10 Essential (primary) hypertension Follow-up Appointments Wound #2 Left,Plantar Toe Great o Return Appointment in 1 week. o Nurse Visit as needed Bathing/ Shower/ Hygiene o May shower; gently cleanse wound with  antibacterial soap, rinse and pat dry prior to dressing wounds o No tub bath. Anesthetic (Use 'Patient Medications' Section for Anesthetic Order Entry) o Lidocaine applied to wound bed Edema Control - Lymphedema / Segmental Compressive Device / Other o Patient to wear own Velcro compression garment. Remove compression stockings every night before going to bed and put on every morning when getting up. - patient needs juxtalite or equivalent velcro wrap bilat lower extremities due to chronic venous insufficiency and stage 3 lympthedema o Compression Pump: Use compression pump on left lower extremity for 60 minutes, twice daily. o Compression Pump: Use compression pump on right lower extremity for 60 minutes, twice daily. o DO YOUR BEST to sleep in the bed at night. DO NOT sleep in your recliner. Long hours of sitting in a recliner leads to swelling of the legs and/or potential wounds on your backside. Off-Loading o Offloading felt to foot. - donut cut in offloading felt o Other: - front off-loader left Additional Orders / Instructions o Follow Nutritious Diet and Increase Protein Intake o Activity as tolerated Wound Treatment Wound #2 - Toe Great Wound Laterality: Plantar, Left Primary Dressing: Hydrofera Blue Ready Transfer Foam, 4x5 (in/in) 1 x Per Day/15 Days Discharge Instructions: double layer cut to size of wound Secondary Dressing: Gauze 1 x Per Day/15 Days Discharge Instructions: As directed: dry, moistened with saline or moistened with Dakins Solution Secured With: Medipore Tape - 70M Medipore H Soft Cloth Surgical Tape, 2x2 (in/yd) 1 x Per Day/15 Days  Patient Medications Allergies: Statins-Hmg-Coa Reductase Inhibitors Notifications Medication Indication Start End amoxicillin-pot clavulanate 04/12/2022 DOSE 1 - oral 875 mg-125 mg tablet - 1 tablet oral taken 2 times per day for 30 days. DYLAN, MONFORTE ([id number]) Electronic Signature(s) Signed: 04/12/2022  1:01:07 PM By: Lenda Kelp PA-C Entered By: Lenda Kelp on 04/12/2022 13:01:06 LESEAN, WOOLVERTON ([id number]) -------------------------------------------------------------------------------- Prescription 04/12/2022 Patient Name: Gene Brown Provider: Allen Derry PA-C Date of Birth: [birth year] NPI#: [id number] Sex: M DEA#: [id number] Phone #: [phone] License #: Patient Address: [facility] [address] [facility] [facility] [address] [facility] [place], [place] [address] [address] [place], [place] [address] [phone] Allergies Statins-Hmg-Coa Reductase Inhibitors Provider's Orders Patient to wear own Velcro compression garment. Remove compression stockings every night before going to bed and put on every morning when getting up. - patient needs juxtalite or equivalent velcro wrap bilat lower extremities due to chronic venous insufficiency and stage 3 lympthedema Hand Signature: Date(s): Electronic Signature(s) Signed: 04/12/2022 4:56:20 PM By: Lenda Kelp PA-C Entered By: Lenda Kelp on 04/12/2022 13:01:07 Gene Brown ([id number]) --------------------------------------------------------------------------------  Problem List Details Patient Name: Gene Brown Date of Service: 04/12/2022 12:30 PM Medical Record Number: [id number] Patient Account Number: [id number] Date of Birth/Sex: 1951/06/27 (71 y.o. M) Treating RN: Yevonne Pax Primary Care Provider: Barbette Reichmann Other Clinician: Referring Provider: Barbette Reichmann Treating Provider/Extender: Rowan Blase in Treatment: 13 Active Problems ICD-10 Encounter Code Description Active Date MDM Diagnosis E11.621 Type 2 diabetes mellitus with foot ulcer 01/07/2022 No Yes L97.522 Non-pressure chronic ulcer of other part of left foot with fat layer 01/07/2022 No Yes exposed E11.42 Type 2 diabetes mellitus with diabetic polyneuropathy 01/07/2022 No Yes I10  Essential (primary) hypertension 01/07/2022 No Yes Inactive Problems Resolved Problems Electronic Signature(s) Signed: 04/12/2022 12:47:04 PM By: Lenda Kelp PA-C Entered By: Lenda Kelp on 04/12/2022 12:47:04 Gene Brown ([id number]) -------------------------------------------------------------------------------- Progress Note Details Patient Name: Gene Brown Date of Service: 04/12/2022 12:30 PM Medical Record Number: [id number] Patient Account Number: [id number] Date of Birth/Sex: [birth year] (71 y.o. M) Treating RN: Yevonne Pax Primary Care Provider: Barbette Reichmann Other Clinician: Referring Provider: Barbette Reichmann Treating Provider/Extender: Rowan Blase in Treatment: 13 Subjective Chief Complaint Information obtained from Patient Left great toe ulcer History of Present Illness (HPI) 05/24/16; this is a 71 year old type II diabetic who probably has some degree of polyneuropathy. He tells me that he has had an area on the plantar aspect of his left great toe that is been followed by podiatry for most the last year. He has a lot of callus, the podiatrist which shave this down put a dressing on and he would follow pad monthly or sometimes longer intervals. He has not noted any drainage or pain. He has not had a recent x-ray. No serious attempt at offloading this with modified footwear.. Last hemoglobin A1c that he knows about was over 10 a month ago he is working hard on this with his primary physician. More recently he has been soaking this with peroxide ABIs are noncompressible and the left leg. He was told by his podiatrist he has a bone spur underneath the wound but did not wish any surgical procedures. He has a caregiver at home for a very disabled wife, the nature of her illness is not certain 05/31/16 patient comes in much the same as last week amounts of callus nonviable subcutaneous tissue which required debridement. His x-ray suggested cortical bone  irregularity. He  will need an MRI. He tells me he has old screws/hardware left knee however he thinks he has had a subsequent MRI after this 06/07/16 at this point in time patient has been attempting to offload his wound as much as he could on his own. He has made a conscious effort to avoid placing any pressure on the toe as he was moving around and walking and I do believe this has payed off and what I am seeing at this point in time. overall the wound appears to be somewhat improved in my opinion he is not having any significant discomfort at this point in time. In fact he tells me he has no pain at all. There is no interval sign of surrounding infection in the wound itself has actually decreased in size. Unfortunately the MRI was not ordered last week and therefore we do need to proceed with the MRI order today. again this is to evaluate the cortical bone irregularity noted on x-ray. 06/14/16 Patient has continued to use the offloading shoe at this point in time. He tells me this is somewhat difficult to get used to walking and but nonetheless he is very strict about wearing it. Again I believe this has paid off very well for him and his wound appears to be greatly improved. He does have some callused area surrounding the wound proximally that has caused some fluid collection of maceration of the callused area. This likely will require debridement today. Some adherent slough noted over the wound bed. 06/21/16; patient's wound on the plantar aspect of his left great toe is totally epithelialized. His MRI of the foot is tomorrow to follow-up on the cortical irregularity noted on plain x-ray. He does not have diabetic foot where 06-28-16-He comes in today with a continued healed left great toe. MRI performed on 06/23/2016 shows no abscess osteomyelitis or septic joint. Patient was discharged from wound care center today. Readmission: 01-07-2022 upon evaluation today patient appears to be doing  somewhat poorly in regard to total ulcer on his left foot. Fortunately there does not appear to be any signs of infection which is great news. Has been seen by Dr. Excell Seltzer and he did request from Dr. Excell Seltzer to come see [place] due to the fact that in the past several years back we were able to get one of his wounds healed about 4 weeks. Again I explained that that is always variable but nonetheless I definitely think we can help. Fortunately I see no evidence of infection. He does have a foot offloading shoe which I think is good to attempt for now he was able to get this healed before with a peg assist. Patient does have a history of type 2 diabetes, diabetic polyneuropathy, and hypertension. This current wound started on 12-09-2021. 01-20-2022 upon evaluation today patient appears to be doing poorly in regard to his great toe. I am actually very concerned based on what I am seeing here at this point about how things are progressing. I do not believe there is any evidence of infection systemically that is obvious but locally I am concerned about the possibility this could even be an osteomyelitis type situation. 01-28-2022 upon evaluation today patient appears to be doing well with regard to his toe ulcer compared to what we were previous. Fortunately I do not see any signs of active infection at this time which is great news. No fevers, chills, nausea, vomiting, or diarrhea. With that being said the patient has not started his Augmentin yet  which I definitely think he needs to get on board ASAP. I do see some signs of new skin growth I think he is using the front offloading shoe which is good for the time being. 02-04-2022 upon evaluation today patient appears to be doing okay in regard to his toe ulcer this is actually measuring a little bit smaller but still has a bit of callus buildup but not as much as we have seen in the past. Fortunately I do not see any evidence of active infection locally or systemically  which is great news. No fevers, chills, nausea, vomiting, or diarrhea. Marland Kitchen 02-14-2022 upon evaluation today patient's wound is actually showing signs of good improvement which is great news. Fortunately I do not see any evidence of active infection locally or systemically which is great news as well. No fevers, chills, nausea, vomiting, or diarrhea. 02-21-2022 upon evaluation today patient's toe ulcer actually is showing signs of excellent improvement. I do not see any evidence of infection locally or systemically which is great news and overall I am extremely pleased with where we stand today. I do not see any signs of active infection locally or systemically which is great news. No fevers, chills, nausea, vomiting, or diarrhea. 7/24; plantar left great toe no real improvement since last week. Using silver alginate gauze. He has a forefoot offloading boot. He also has lymphedema and uses his compression pumps I do not think this is adding any particular difficulties. It sounds as though he is still pretty active 03-07-2022 upon evaluation today patient appears to be doing excellent in regard to the wound compared to previous pictures. He still has a quite TRAVERS, GOODLEY. ([id number]) deep wound on the toe good news is the x-ray did not show any signs of osteomyelitis which is excellent. He does need some debridement today. 03-14-2022 upon evaluation today patient appears to be doing well currently in regard to his wound. He has actually been tolerating the dressing changes without complication. With that being said the dressing seems to either be draining a lot or getting very wet otherwise I think we need to have him change this more frequently. 03-21-2022 upon evaluation today patient appears to be doing well with regard to his toe ulcer I am actually seeing signs of improvement which is great news. This is slow but nonetheless the skin around the edges of the wound are definitely improving as we proceed  here. I do not see any evidence of active infection locally or systemically which is great news. 03-29-2022 upon evaluation today patient appears to be doing fairly well except for with regard to his toe being somewhat red. I do think that we may want to place him back on the Augmentin which was previously beneficial for him. He is not in disagreement with this. Otherwise I did perform some debridement of clearway some of the necrotic debris today he seems to be tolerating this without complication. 04-04-2022 upon evaluation today patient unfortunately is continued to have significant issues here with his toe ulcer. He actually has an abscess more proximal on the dorsal aspect of the toe which is new since last week. Subsequently he is on antibiotics already I think we need to proceed with an MRI to further evaluate this. 04-12-2022 upon evaluation today patient appears to be doing better in regard to his toe ulcer. This actually is less red than what it was he is about done with the initial antibiotic he does has his MRI scheduled for Friday.  The reason I am going to probably extend the Augmentin for the time being I will to make sure that we keep this under control especially if he potentially has a bone infection I do not going to be going without the antibiotic therapy. Patient is in agreement with that plan. Objective Constitutional Well-nourished and well-hydrated in no acute distress. Vitals Time Taken: 12:38 PM, Height: 68 in, Weight: 217 lbs, BMI: 33, Temperature: 98.2 F, Pulse: 74 bpm, Respiratory Rate: 18 breaths/min, Blood Pressure: 172/76 mmHg. Respiratory normal breathing without difficulty. Psychiatric this patient is able to make decisions and demonstrates good insight into disease process. Alert and Oriented x 3. pleasant and cooperative. General Notes: Upon inspection patient's wound bed actually showed signs of good granulation and epithelization at this point. Fortunately  he does not show any signs of infection systemically which is great news and overall I am extremely pleased with where things stand here. Integumentary (Hair, Skin) Wound #2 status is Open. Original cause of wound was Gradually Appeared. The date acquired was: 12/09/2021. The wound has been in treatment 13 weeks. The wound is located on the [employer]. The wound measures 0.5cm length x 4cm width x 0.4cm depth; 1.571cm^2 area and 0.628cm^3 volume. There is Fat Layer (Subcutaneous Tissue) exposed. There is no tunneling or undermining noted. There is a medium amount of serosanguineous drainage noted. The wound margin is flat and intact. There is large (67-100%) pink, pale granulation within the wound bed. There is a small (1-33%) amount of necrotic tissue within the wound bed including Adherent Slough. 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 There was a Excisional Skin/Subcutaneous Tissue Debridement with a total area of 0.3 sq cm performed by Nelida Meuse., PA-C. With the following instrument(s): Curette to remove Viable and Non-Viable tissue/material. Material removed includes Callus, Subcutaneous Tissue, Slough, Skin: Dermis, and Skin: Epidermis. No specimens were taken. A time out was conducted at 12:50, prior to the start of the procedure. A Minimum amount of bleeding was controlled with Pressure. The procedure was tolerated well with a pain level of 0 throughout and a pain level of 0 following the procedure. Post Debridement Measurements: 0.5cm length x 4cm  width x 0.4cm depth; 0.628cm^3 volume. Character of Wound/Ulcer Post Debridement is improved. Severity of Tissue Post Debridement is: Fat layer exposed. Post procedure Diagnosis Wound #2: Same as Pre-Procedure Plan Follow-up Appointments: Wound #2 Left,Plantar Toe Great: Return Appointment in 1 week. Nurse Visit as needed Bathing/ Shower/ Hygiene: May shower; gently cleanse wound with antibacterial soap, rinse and pat dry prior to dressing wounds No tub bath. Anesthetic (Use 'Patient Medications' Section for Anesthetic Order Entry): Lidocaine applied to wound bed Edema Control - Lymphedema / Segmental Compressive Device / Other: Patient to wear own Velcro compression garment. Remove compression stockings every night before going to bed and put on every morning when getting up. - patient needs juxtalite or equivalent velcro wrap bilat lower extremities due to chronic venous insufficiency and stage 3 lympthedema Compression Pump: Use compression pump on left lower extremity for 60 minutes, twice daily. Compression Pump: Use compression pump on right lower extremity for 60  minutes, twice daily. DO YOUR BEST to sleep in the bed at night. DO NOT sleep in your recliner. Long hours of sitting in a recliner leads to swelling of the legs and/or potential wounds on your backside. Off-Loading: Offloading felt to foot. - donut cut in offloading felt Other: - front off-loader left Additional Orders / Instructions: Follow Nutritious Diet and Increase Protein Intake Activity as tolerated The following medication(s) was prescribed: amoxicillin-pot clavulanate oral 875 mg-125 mg tablet 1 1 tablet oral taken 2 times per day for 30 days. starting 04/12/2022 WOUND #2: - Toe Great Wound Laterality: Plantar, Left Primary Dressing: Hydrofera Blue Ready Transfer Foam, 4x5 (in/in) 1 x Per Day/15 Days Discharge Instructions: double layer cut to size of wound Secondary Dressing: Gauze 1 x Per Day/15  Days Discharge Instructions: As directed: dry, moistened with saline or moistened with Dakins Solution Secured With: Medipore Tape - 55M Medipore H Soft Cloth Surgical Tape, 2x2 (in/yd) 1 x Per Day/15 Days 1. Based on what I am seeing currently I do think that the optimal thing will be for [place] to proceed with the [facility] Blue dressings which does seem to be doing well. Patient is in agreement with that plan. 2. I am also going to go ahead and send in a prescription for the Augmentin which I feel like is still good to be a good option here for him. In fact I think the Augmentin is probably the best thing for him to be doing. He seems to be making excellent progress with this over the past 2 weeks and therefore I think extending that is probably the right thing to do. 3. I am also can recommend that we have the patient continue with the dressing changes every other day at least if they get soiled he may need to do this sooner. Again depend on the MRI results we will adjust our treatment plan as needed. We will see patient back for reevaluation in 1 week here in the clinic. If anything worsens or changes patient will contact our office for additional recommendations. Electronic Signature(s) Signed: 04/12/2022 1:01:36 PM By: Lenda Kelp PA-C Entered By: Lenda Kelp on 04/12/2022 13:01:35 Gene Brown ([id number]) -------------------------------------------------------------------------------- SuperBill Details Patient Name: Gene Brown Date of Service: 04/12/2022 Medical Record Number: [id number] Patient Account Number: [id number] Date of Birth/Sex: [birth year] (71 y.o. M) Treating RN: Yevonne Pax Primary Care Provider: Barbette Reichmann Other Clinician: Referring Provider: Barbette Reichmann Treating Provider/Extender: Rowan Blase in Treatment: 13 Diagnosis Coding ICD-10 Codes Code Description E11.621 Type 2 diabetes mellitus with foot ulcer L97.522 Non-pressure chronic  ulcer of other part of left foot with fat layer exposed E11.42 Type 2 diabetes mellitus with diabetic polyneuropathy I10 Essential (primary) hypertension Facility Procedures CPT4 Code: 63149702 Description: 11042 - DEB SUBQ TISSUE 20 SQ CM/< Modifier: Quantity: 1 CPT4 Code: Description: ICD-10 Diagnosis Description 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 Modifier: osed Quantity: Physician Procedures CPT4 Code: [id number] Description: 99214 - WC PHYS LEVEL 4 - EST PT Modifier: 25 Quantity: 1 CPT4 Code: Description: ICD-10 Diagnosis Description E11.621 Type 2 diabetes mellitus with foot ulcer 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 E11.42 Type 2 diabetes mellitus with diabetic polyneuropathy I10 Essential  (primary) hypertension Modifier: osed Quantity: CPT4 Code: [id number] Description: 11042 - WC PHYS SUBQ TISS 20 SQ CM Modifier: Quantity: 1 CPT4 Code: Description: ICD-10 Diagnosis Description L97.522 Non-pressure chronic ulcer of other part  of left foot with fat layer exp Modifier: osed Quantity: Electronic Signature(s) Signed: 04/12/2022 1:02:11 PM By: Lenda Kelp PA-C Entered By: Lenda Kelp on 04/12/2022 13:02:10

## 2022-04-15 ENCOUNTER — Ambulatory Visit
Admission: RE | Admit: 2022-04-15 | Discharge: 2022-04-15 | Disposition: A | Discharge status: Home / Self Care | Payer: Medicare Other | Source: Ambulatory Visit | Attending: Physician Assistant | Admitting: Physician Assistant

## 2022-04-15 DIAGNOSIS — E11621 Type 2 diabetes mellitus with foot ulcer: Secondary | ICD-10-CM | POA: Diagnosis present

## 2022-04-15 DIAGNOSIS — L97509 Non-pressure chronic ulcer of other part of unspecified foot with unspecified severity: Secondary | ICD-10-CM | POA: Insufficient documentation

## 2022-04-15 MED ORDER — GADOBUTROL 1 MMOL/ML IV SOLN
10.0000 mL | Freq: Once | INTRAVENOUS | Status: AC | PRN
  Administered 2022-04-15: 10 mL via INTRAVENOUS

## 2022-04-18 ENCOUNTER — Encounter: Payer: Medicare Other | Admitting: Physician Assistant

## 2022-04-18 DIAGNOSIS — E11621 Type 2 diabetes mellitus with foot ulcer: Secondary | ICD-10-CM | POA: Diagnosis not present

## 2022-04-18 NOTE — Progress Notes (Signed)
ROLLY, MAGRI ([id number]) Visit Report for 04/12/2022 Arrival Information Details Patient Name: Gene Brown, Gene Brown Date of Service: 04/12/2022 12:30 PM Medical Record Number: [id number] Patient Account Number: [id number] Date of Birth/Sex: 07/08/1951 (71 y.o. M) Treating RN: Carlene Coria Primary Care Meloney Feld: Tracie Harrier Other Clinician: Referring Jennine Peddy: Tracie Harrier Treating Latalia Etzler/Extender: Skipper Cliche in Treatment: 53 Visit Information History Since Last Visit All ordered tests and consults were completed: No Patient Arrived: Ambulatory Added or deleted any medications: No Arrival Time: 12:34 Any new allergies or adverse reactions: No Accompanied By: self Had a fall or experienced change in No Transfer Assistance: None activities of daily living that may affect Patient Identification Verified: Yes risk of falls: Secondary Verification Process Completed: Yes Signs or symptoms of abuse/neglect since last visito No Patient Requires Transmission-Based No Hospitalized since last visit: No Precautions: Implantable device outside of the clinic excluding No Patient Has Alerts: Yes cellular tissue based products placed in the center Patient Alerts: Patient on Blood Thinner since last visit: 01/07/2022 [place]>220 on Has Dressing in Place as Prescribed: Yes left Pain Present Now: No Electronic Signature(s) Signed: 04/18/2022 8:13:04 AM By: Carlene Coria RN Entered By: Carlene Coria on 04/12/2022 12:38:12 Gene Brown ([id number]) -------------------------------------------------------------------------------- Clinic Level of Care Assessment Details Patient Name: Gene Brown Date of Service: 04/12/2022 12:30 PM Medical Record Number: [id number] Patient Account Number: [id number] Date of Birth/Sex: 24-Dec-1950 (71 y.o. M) Treating RN: Carlene Coria Primary Care Junah Yam: Tracie Harrier Other Clinician: Referring Brylie Sneath: Tracie Harrier Treating  Darleene Cumpian/Extender: Skipper Cliche in Treatment: 13 Clinic Level of Care Assessment Items TOOL 1 Quantity Score []  - Use when EandM and Procedure is performed on INITIAL visit 0 ASSESSMENTS - Nursing Assessment / Reassessment []  - General Physical Exam (combine w/ comprehensive assessment (listed just below) when performed on new 0 pt. evals) []  - 0 Comprehensive Assessment (HX, ROS, Risk Assessments, Wounds Hx, etc.) ASSESSMENTS - Wound and Skin Assessment / Reassessment []  - Dermatologic / Skin Assessment (not related to wound area) 0 ASSESSMENTS - Ostomy and/or Continence Assessment and Care []  - Incontinence Assessment and Management 0 []  - 0 Ostomy Care Assessment and Management (repouching, etc.) PROCESS - Coordination of Care []  - Simple Patient / Family Education for ongoing care 0 []  - 0 Complex (extensive) Patient / Family Education for ongoing care []  - 0 Staff obtains Programmer, systems, Records, Test Results / Process Orders []  - 0 Staff telephones HHA, Nursing Homes / Clarify orders / etc []  - 0 Routine Transfer to another Facility (non-emergent condition) []  - 0 Routine Hospital Admission (non-emergent condition) []  - 0 New Admissions / Biomedical engineer / Ordering NPWT, Apligraf, etc. []  - 0 Emergency Hospital Admission (emergent condition) PROCESS - Special Needs []  - Pediatric / Minor Patient Management 0 []  - 0 Isolation Patient Management []  - 0 Hearing / Language / Visual special needs []  - 0 Assessment of Community assistance (transportation, D/C planning, etc.) []  - 0 Additional assistance / Altered mentation []  - 0 Support Surface(s) Assessment (bed, cushion, seat, etc.) INTERVENTIONS - Miscellaneous []  - External ear exam 0 []  - 0 Patient Transfer (multiple staff / Civil Service fast streamer / Similar devices) []  - 0 Simple Staple / Suture removal (25 or less) []  - 0 Complex Staple / Suture removal (26 or more) []  - 0 Hypo/Hyperglycemic Management (do not  check if billed separately) []  - 0 Ankle / Brachial Index (ABI) - do not check if billed separately Has the patient been seen at the hospital  within the last three years: Yes Total Score: 0 Level Of Care: ____ Gene Brown ([id number]) Electronic Signature(s) Signed: 04/18/2022 8:13:04 AM By: Carlene Coria RN Entered By: Carlene Coria on 04/12/2022 12:53:20 Gene Brown ([id number]) -------------------------------------------------------------------------------- Encounter Discharge Information Details Patient Name: Gene Brown Date of Service: 04/12/2022 12:30 PM Medical Record Number: [id number] Patient Account Number: [id number] Date of Birth/Sex: [birth year] (71 y.o. M) Treating RN: Carlene Coria Primary Care Camron Monday: Tracie Harrier Other Clinician: Referring Camaryn Lumbert: Tracie Harrier Treating Brayden Brodhead/Extender: Skipper Cliche in Treatment: 13 Encounter Discharge Information Items Post Procedure Vitals Discharge Condition: Stable Temperature (F): 98.2 Ambulatory Status: Cane Pulse (bpm): 74 Discharge Destination: Home Respiratory Rate (breaths/min): 18 Transportation: Private Auto Blood Pressure (mmHg): 172/76 Accompanied By: self Schedule Follow-up Appointment: Yes Clinical Summary of Care: Electronic Signature(s) Signed: 04/18/2022 8:13:04 AM By: Carlene Coria RN Entered By: Carlene Coria on 04/12/2022 12:54:19 Gene Brown ([id number]) -------------------------------------------------------------------------------- Lower Extremity Assessment Details Patient Name: Gene Brown Date of Service: 04/12/2022 12:30 PM Medical Record Number: [id number] Patient Account Number: [id number] Date of Birth/Sex: [birth year] (71 y.o. M) Treating RN: Carlene Coria Primary Care Alizabeth Antonio: Tracie Harrier Other Clinician: Referring Kesler Wickham: Tracie Harrier Treating Coltrane Tugwell/Extender: Skipper Cliche in Treatment: 13 Edema Assessment Assessed: [Left: No]  [Right: No] Edema: [Left: Ye] [Right: s] Calf Left: Right: Point of Measurement: 28 cm From Medial Instep 45 cm Ankle Left: Right: Point of Measurement: 11 cm From Medial Instep 30 cm Vascular Assessment Pulses: Dorsalis Pedis Palpable: [Left:Yes] Electronic Signature(s) Signed: 04/18/2022 8:13:04 AM By: Carlene Coria RN Entered By: Carlene Coria on 04/12/2022 12:45:08 Gene Brown ([id number]) -------------------------------------------------------------------------------- Multi Wound Chart Details Patient Name: Gene Brown Date of Service: 04/12/2022 12:30 PM Medical Record Number: [id number] Patient Account Number: [id number] Date of Birth/Sex: Dec 09, 1950 (71 y.o. M) Treating RN: Carlene Coria Primary Care Amilyah Nack: Tracie Harrier Other Clinician: Referring Nile Prisk: Tracie Harrier Treating Rigby Leonhardt/Extender: Skipper Cliche in Treatment: 13 Vital Signs Height(in): 68 Pulse(bpm): 74 Weight(lbs): 217 Blood Pressure(mmHg): 172/76 Body Mass Index(BMI): 33 Temperature(F): 98.2 Respiratory Rate(breaths/min): 18 Photos: [N/A:N/A] Wound Location: Left, Plantar Toe Great N/A N/A Wounding Event: Gradually Appeared N/A N/A Primary Etiology: Diabetic Wound/Ulcer of the Lower N/A N/A Extremity Comorbid History: Cataracts, Sickle Cell Disease, Sleep N/A N/A Apnea, Hypertension, Type II Diabetes, Gout, Osteoarthritis Date Acquired: 12/09/2021 N/A N/A Weeks of Treatment: 13 N/A N/A Wound Status: Open N/A N/A Wound Recurrence: No N/A N/A Measurements L x W x D (cm) 0.5x4x0.4 N/A N/A Area (cm) : 1.571 N/A N/A Volume (cm) : 0.628 N/A N/A % Reduction in Area: -455.10% N/A N/A % Reduction in Volume: -345.40% N/A N/A Classification: Grade 2 N/A N/A Exudate Amount: Medium N/A N/A Exudate Type: Serosanguineous N/A N/A Exudate Color: red, brown N/A N/A Wound Margin: Flat and Intact N/A N/A Granulation Amount: Large (67-100%) N/A N/A Granulation Quality: Pink, Pale N/A  N/A Necrotic Amount: Small (1-33%) N/A N/A Exposed Structures: Fat Layer (Subcutaneous Tissue): N/A N/A Yes Fascia: No Tendon: No Muscle: No Joint: No Bone: No Epithelialization: None N/A N/A Treatment Notes Electronic Signature(s) Signed: 04/18/2022 8:13:04 AM By: Carlene Coria RN Entered By: Carlene Coria on 04/12/2022 12:45:21 Gene Brown ([id number] Brown ([id number]) -------------------------------------------------------------------------------- Multi-Disciplinary Care Plan Details Patient Name: Gene Brown Date of Service: 04/12/2022 12:30 PM Medical Record Number: [id number] Patient Account Number: [id number] Date of Birth/Sex: March 08, 1951 (71 y.o. M) Treating RN: Carlene Coria Primary Care Prabhjot Piscitello: Tracie Harrier Other Clinician: Referring Stephania Macfarlane: Tracie Harrier Treating Nickolus Wadding/Extender: Skipper Cliche in  Treatment: 13 Active Inactive Wound/Skin Impairment Nursing Diagnoses: Impaired tissue integrity Knowledge deficit related to smoking impact on wound healing Goals: Patient/caregiver will verbalize understanding of skin care regimen Date Initiated: 01/07/2022 Date Inactivated: 02/21/2022 Target Resolution Date: 01/07/2022 Goal Status: Met Ulcer/skin breakdown will have a volume reduction of 30% by week 4 Date Initiated: 01/07/2022 Target Resolution Date: 02/04/2022 Goal Status: Active Ulcer/skin breakdown will have a volume reduction of 50% by week 8 Date Initiated: 01/07/2022 Target Resolution Date: 03/03/2022 Goal Status: Active Interventions: Assess patient/caregiver ability to obtain necessary supplies Assess patient/caregiver ability to perform ulcer/skin care regimen upon admission and as needed Assess ulceration(s) every visit Treatment Activities: Referred to DME Treavor Blomquist for dressing supplies : 01/07/2022 Skin care regimen initiated : 01/07/2022 Topical wound management initiated : 01/07/2022 Notes: Electronic Signature(s) Signed:  04/18/2022 8:13:04 AM By: Carlene Coria RN Entered By: Carlene Coria on 04/12/2022 12:45:15 Gene Brown ([id number]) -------------------------------------------------------------------------------- Pain Assessment Details Patient Name: Gene Brown Date of Service: 04/12/2022 12:30 PM Medical Record Number: [id number] Patient Account Number: [id number] Date of Birth/Sex: Apr 06, 1951 (71 y.o. M) Treating RN: Carlene Coria Primary Care Chalsea Darko: Tracie Harrier Other Clinician: Referring Octivia Canion: Tracie Harrier Treating Vyctoria Dickman/Extender: Skipper Cliche in Treatment: 13 Active Problems Location of Pain Severity and Description of Pain Patient Has Paino No Site Locations Pain Management and Medication Current Pain Management: Electronic Signature(s) Signed: 04/18/2022 8:13:04 AM By: Carlene Coria RN Entered By: Carlene Coria on 04/12/2022 12:38:41 Gene Brown ([id number]) -------------------------------------------------------------------------------- Patient/Caregiver Education Details Patient Name: Gene Brown Date of Service: 04/12/2022 12:30 PM Medical Record Number: [id number] Patient Account Number: [id number] Date of Birth/Gender: March 04, 1951 (71 y.o. M) Treating RN: Carlene Coria Primary Care Physician: Tracie Harrier Other Clinician: Referring Physician: Tracie Harrier Treating Physician/Extender: Skipper Cliche in Treatment: 13 Education Assessment Education Provided To: Patient Education Topics Provided Wound/Skin Impairment: Methods: Explain/Verbal Responses: State content correctly Electronic Signature(s) Signed: 04/18/2022 8:13:04 AM By: Carlene Coria RN Entered By: Carlene Coria on 04/12/2022 12:53:33 Gene Brown ([id number]) -------------------------------------------------------------------------------- Wound Assessment Details Patient Name: Gene Brown Date of Service: 04/12/2022 12:30 PM Medical Record Number:  [id number] Patient Account Number: [id number] Date of Birth/Sex: [birth year] (71 y.o. M) Treating RN: Carlene Coria Primary Care Cathrine Krizan: Tracie Harrier Other Clinician: Referring Clive Parcel: Tracie Harrier Treating Ashutosh Dieguez/Extender: Skipper Cliche in Treatment: 13 Wound Status Wound Number: 2 Primary Diabetic Wound/Ulcer of the Lower Extremity Etiology: Wound Location: Left, Plantar Toe Great Wound Open Wounding Event: Gradually Appeared Status: Date Acquired: 12/09/2021 Comorbid Cataracts, Sickle Cell Disease, Sleep Apnea, Weeks Of Treatment: 13 History: Hypertension, Type II Diabetes, Gout, Osteoarthritis Clustered Wound: No Photos Wound Measurements Length: (cm) 0.5 % Redu Width: (cm) 4 % Redu Depth: (cm) 0.4 Epithe Area: (cm) 1.571 [place] Volume: (cm) 0.628 Under ction in Area: -455.1% ction in Volume: -345.4% lialization: None ling: No mining: No Wound Description Classification: Grade 2 Foul Wound Margin: Flat and Intact Sloug Exudate Amount: Medium Exudate Type: Serosanguineous Exudate Color: red, brown Odor After Cleansing: No h/Fibrino Yes Wound Bed Granulation Amount: Large (67-100%) Exposed Structure Granulation Quality: Pink, Pale Fascia Exposed: No Necrotic Amount: Small (1-33%) Fat Layer (Subcutaneous Tissue) Exposed: Yes Necrotic Quality: Adherent Slough Tendon Exposed: No Muscle Exposed: No Joint Exposed: No Bone Exposed: No Treatment Notes Wound #2 (Toe Great) Wound Laterality: Plantar, Left Cleanser Peri-Wound Care Topical Gene Brown, Gene Brown ([id number]) Primary Dressing Hydrofera Blue Ready Transfer Foam, 4x5 (in/in) Discharge Instruction: double layer cut to size of wound Secondary Dressing Gauze Discharge Instruction: As directed:  dry, moistened with saline or moistened with Dakins Solution Secured With Medipore Tape - 72M Medipore H Soft Cloth Surgical Tape, 2x2 (in/yd) Compression Wrap Compression Stockings Add-Ons Electronic  Signature(s) Signed: 04/18/2022 8:13:04 AM By: Carlene Coria RN Entered By: Carlene Coria on 04/12/2022 12:44:17 Gene Brown ([id number]) -------------------------------------------------------------------------------- Vitals Details Patient Name: Gene Brown Date of Service: 04/12/2022 12:30 PM Medical Record Number: [id number] Patient Account Number: [id number] Date of Birth/Sex: 17-Jun-1951 (70 y.o. M) Treating RN: Carlene Coria Primary Care Eudora Guevarra: Tracie Harrier Other Clinician: Referring Khi Mcmillen: Tracie Harrier Treating Jiovanni Heeter/Extender: Skipper Cliche in Treatment: 13 Vital Signs Time Taken: 12:38 Temperature (F): 98.2 Height (in): 68 Pulse (bpm): 74 Weight (lbs): 217 Respiratory Rate (breaths/min): 18 Body Mass Index (BMI): 33 Blood Pressure (mmHg): 172/76 Reference Range: 80 - 120 mg / dl Electronic Signature(s) Signed: 04/18/2022 8:13:04 AM By: Carlene Coria RN Entered By: Carlene Coria on 04/12/2022 12:38:30

## 2022-04-18 NOTE — Progress Notes (Addendum)
BRAETON, CHAIRES ([id number]) Visit Report for 04/18/2022 Arrival Information Details Patient Name: Gene Brown, Gene Brown Date of Service: 04/18/2022 10:15 AM Medical Record Number: [id number] Patient Account Number: [id number] Date of Birth/Sex: [birth year] (71 y.o. M) Treating RN: Carlene Coria Primary Care Harlis Champoux: Tracie Harrier Other Clinician: Referring Shaunee Mulkern: Tracie Harrier Treating Ianna Salmela/Extender: Skipper Cliche in Treatment: 14 Visit Information History Since Last Visit All ordered tests and consults were completed: No Patient Arrived: Cane Added or deleted any medications: No Arrival Time: 09:58 Any new allergies or adverse reactions: No Accompanied By: self Had a fall or experienced change in No Transfer Assistance: None activities of daily living that may affect Patient Identification Verified: Yes risk of falls: Secondary Verification Process Completed: Yes Signs or symptoms of abuse/neglect since last visito No Patient Requires Transmission-Based No Hospitalized since last visit: No Precautions: Implantable device outside of the clinic excluding No Patient Has Alerts: Yes cellular tissue based products placed in the center Patient Alerts: Patient on Blood since last visit: Thinner Has Dressing in Place as Prescribed: Yes 01/07/2022 >220 on Pain Present Now: No left Electronic Signature(s) Signed: 04/18/2022 4:14:55 PM By: Carlene Coria RN Entered By: Carlene Coria on 04/18/2022 10:02:54 Gene Brown ([id number]) -------------------------------------------------------------------------------- Clinic Level of Care Assessment Details Patient Name: Gene Brown Date of Service: 04/18/2022 10:15 AM Medical Record Number: [id number] Patient Account Number: [id number] Date of Birth/Sex: [birth year] (71 y.o. M) Treating RN: Carlene Coria Primary Care Zoeie Ritter: Tracie Harrier Other Clinician: Referring Brayah Urquilla: Tracie Harrier Treating  Garmon Dehn/Extender: Skipper Cliche in Treatment: 14 Clinic Level of Care Assessment Items TOOL 1 Quantity Score []  - Use when EandM and Procedure is performed on INITIAL visit 0 ASSESSMENTS - Nursing Assessment / Reassessment []  - General Physical Exam (combine w/ comprehensive assessment (listed just below) when performed on new 0 pt. evals) []  - 0 Comprehensive Assessment (HX, ROS, Risk Assessments, Wounds Hx, etc.) ASSESSMENTS - Wound and Skin Assessment / Reassessment []  - Dermatologic / Skin Assessment (not related to wound area) 0 ASSESSMENTS - Ostomy and/or Continence Assessment and Care []  - Incontinence Assessment and Management 0 []  - 0 Ostomy Care Assessment and Management (repouching, etc.) PROCESS - Coordination of Care []  - Simple Patient / Family Education for ongoing care 0 []  - 0 Complex (extensive) Patient / Family Education for ongoing care []  - 0 Staff obtains Programmer, systems, Records, Test Results / Process Orders []  - 0 Staff telephones HHA, Nursing Homes / Clarify orders / etc []  - 0 Routine Transfer to another Facility (non-emergent condition) []  - 0 Routine Hospital Admission (non-emergent condition) []  - 0 New Admissions / Biomedical engineer / Ordering NPWT, Apligraf, etc. []  - 0 Emergency Hospital Admission (emergent condition) PROCESS - Special Needs []  - Pediatric / Minor Patient Management 0 []  - 0 Isolation Patient Management []  - 0 Hearing / Language / Visual special needs []  - 0 Assessment of Community assistance (transportation, D/C planning, etc.) []  - 0 Additional assistance / Altered mentation []  - 0 Support Surface(s) Assessment (bed, cushion, seat, etc.) INTERVENTIONS - Miscellaneous []  - External ear exam 0 []  - 0 Patient Transfer (multiple staff / Civil Service fast streamer / Similar devices) []  - 0 Simple Staple / Suture removal (25 or less) []  - 0 Complex Staple / Suture removal (26 or more) []  - 0 Hypo/Hyperglycemic Management (do not  check if billed separately) []  - 0 Ankle / Brachial Index (ABI) - do not check if billed separately Has the patient been seen at the hospital  within the last three years: Yes Total Score: 0 Level Of Care: ____ Gene Brown ([id number]) Electronic Signature(s) Signed: 04/18/2022 4:14:55 PM By: Carlene Coria RN Entered By: Carlene Coria on 04/18/2022 10:31:43 Gene Brown ([id number]) -------------------------------------------------------------------------------- Encounter Discharge Information Details Patient Name: Gene Brown Date of Service: 04/18/2022 10:15 AM Medical Record Number: [id number] Patient Account Number: [id number] Date of Birth/Sex: 08/19/1950 (71 y.o. M) Treating RN: Carlene Coria Primary Care Kyandre Okray: Tracie Harrier Other Clinician: Referring [place]: Tracie Harrier Treating Kee Drudge/Extender: Skipper Cliche in Treatment: 14 Encounter Discharge Information Items Post Procedure Vitals Discharge Condition: Stable Temperature (F): 97.9 Ambulatory Status: Cane Pulse (bpm): 75 Discharge Destination: Home Respiratory Rate (breaths/min): 18 Transportation: Private Auto Blood Pressure (mmHg): 167/73 Accompanied By: self Schedule Follow-up Appointment: Yes Clinical Summary of Care: Electronic Signature(s) Signed: 04/18/2022 4:14:55 PM By: Carlene Coria RN Entered By: Carlene Coria on 04/18/2022 10:32:41 Gene Brown ([id number]) -------------------------------------------------------------------------------- Lower Extremity Assessment Details Patient Name: Gene Brown Date of Service: 04/18/2022 10:15 AM Medical Record Number: [id number] Patient Account Number: [id number] Date of Birth/Sex: 07-28-1951 (71 y.o. M) Treating RN: Carlene Coria Primary Care Jansen Goodpasture: Tracie Harrier Other Clinician: Referring Dean Goldner: Tracie Harrier Treating Osmel Dykstra/Extender: Skipper Cliche in Treatment: 14 Edema Assessment Assessed: [Left: No]  [Right: No] Edema: [Left: Ye] [Right: s] Calf Left: Right: Point of Measurement: 28 cm From Medial Instep 48 cm Ankle Left: Right: Point of Measurement: 11 cm From Medial Instep 30 cm Vascular Assessment Pulses: Dorsalis Pedis Palpable: [Left:Yes] Electronic Signature(s) Signed: 04/18/2022 4:14:55 PM By: Carlene Coria RN Entered By: Carlene Coria on 04/18/2022 10:07:47 Gene Brown ([id number]) -------------------------------------------------------------------------------- Multi Wound Chart Details Patient Name: Gene Brown Date of Service: 04/18/2022 10:15 AM Medical Record Number: [id number] Patient Account Number: [id number] Date of Birth/Sex: 1950/09/18 (71 y.o. M) Treating RN: Carlene Coria Primary Care Allyah Heather: Tracie Harrier Other Clinician: Referring Skyllar Notarianni: Tracie Harrier Treating Ronit Cranfield/Extender: Skipper Cliche in Treatment: 14 Vital Signs Height(in): 68 Pulse(bpm): 75 Weight(lbs): 217 Blood Pressure(mmHg): 167/73 Body Mass Index(BMI): 33 Temperature(F): 97.9 Respiratory Rate(breaths/min): 18 Photos: [N/A:N/A] Wound Location: Left, Plantar Toe Great N/A N/A Wounding Event: Gradually Appeared N/A N/A Primary Etiology: Diabetic Wound/Ulcer of the Lower N/A N/A Extremity Comorbid History: Cataracts, Sickle Cell Disease, Sleep N/A N/A Apnea, Hypertension, Type II Diabetes, Gout, Osteoarthritis Date Acquired: 12/09/2021 N/A N/A Weeks of Treatment: 14 N/A N/A Wound Status: Open N/A N/A Wound Recurrence: No N/A N/A Measurements L x W x D (cm) 0.4x4x0.4 N/A N/A Area (cm) : 1.257 N/A N/A Volume (cm) : 0.503 N/A N/A % Reduction in Area: -344.20% N/A N/A % Reduction in Volume: -256.70% N/A N/A Classification: Grade 2 N/A N/A Exudate Amount: Medium N/A N/A Exudate Type: Serosanguineous N/A N/A Exudate Color: red, brown N/A N/A Wound Margin: Flat and Intact N/A N/A Granulation Amount: Large (67-100%) N/A N/A Granulation Quality: Pink, Pale N/A  N/A Necrotic Amount: Small (1-33%) N/A N/A Exposed Structures: Fat Layer (Subcutaneous Tissue): N/A N/A Yes Fascia: No Tendon: No Muscle: No Joint: No Bone: No Epithelialization: None N/A N/A Treatment Notes Electronic Signature(s) Signed: 04/18/2022 4:14:55 PM By: Carlene Coria RN Entered By: Carlene Coria on 04/18/2022 10:08:05 Gene Brown ([id number] Brown ([id number]) -------------------------------------------------------------------------------- Multi-Disciplinary Care Plan Details Patient Name: Gene Brown Date of Service: 04/18/2022 10:15 AM Medical Record Number: [id number] Patient Account Number: [id number] Date of Birth/Sex: [birth year] (71 y.o. M) Treating RN: Carlene Coria Primary Care Kerrie Timm: Tracie Harrier Other Clinician: Referring Kijana Estock: Tracie Harrier Treating Brighid Koch/Extender: Skipper Cliche in  Treatment: 14 Active Inactive Electronic Signature(s) Signed: 04/18/2022 10:48:29 AM By: Yevonne Pax RN Entered By: Yevonne Pax on 04/18/2022 10:48:29 Gene Brown ([id number]) -------------------------------------------------------------------------------- Pain Assessment Details Patient Name: Gene Brown Date of Service: 04/18/2022 10:15 AM Medical Record Number: [id number] Patient Account Number: [id number] Date of Birth/Sex: March 22, 1951 (71 y.o. M) Treating RN: Yevonne Pax Primary Care Sharai Overbay: Barbette Reichmann Other Clinician: Referring Lexy Meininger: Barbette Reichmann Treating Royer Cristobal/Extender: Rowan Blase in Treatment: 14 Active Problems Location of Pain Severity and Description of Pain Patient Has Paino No Site Locations Pain Management and Medication Current Pain Management: Electronic Signature(s) Signed: 04/18/2022 4:14:55 PM By: Yevonne Pax RN Entered By: Yevonne Pax on 04/18/2022 10:03:48 Gene Brown  ([id number]) -------------------------------------------------------------------------------- Patient/Caregiver Education Details Patient Name: Gene Brown Date of Service: 04/18/2022 10:15 AM Medical Record Number: [id number] Patient Account Number: [id number] Date of Birth/Gender: [birth year] (71 y.o. M) Treating RN: Yevonne Pax Primary Care Physician: Barbette Reichmann Other Clinician: Referring Physician: Barbette Reichmann Treating Physician/Extender: Rowan Blase in Treatment: 14 Education Assessment Education Provided To: Patient Education Topics Provided Wound/Skin Impairment: Methods: Explain/Verbal Responses: State content correctly Electronic Signature(s) Signed: 04/18/2022 4:14:55 PM By: Yevonne Pax RN Entered By: Yevonne Pax on 04/18/2022 10:31:59 Gene Brown ([id number]) -------------------------------------------------------------------------------- Wound Assessment Details Patient Name: Gene Brown Date of Service: 04/18/2022 10:15 AM Medical Record Number: [id number] Patient Account Number: [id number] Date of Birth/Sex: 1950/09/26 (71 y.o. M) Treating RN: Yevonne Pax Primary Care Ferdinando Lodge: Barbette Reichmann Other Clinician: Referring Tekesha Almgren: Barbette Reichmann Treating Adreana Coull/Extender: Rowan Blase in Treatment: 14 Wound Status Wound Number: 2 Primary Diabetic Wound/Ulcer of the Lower Extremity Etiology: Wound Location: Left, Plantar Toe Great Wound Open Wounding Event: Gradually Appeared Status: Date Acquired: 12/09/2021 Comorbid Cataracts, Sickle Cell Disease, Sleep Apnea, Weeks Of Treatment: 14 History: Hypertension, Type II Diabetes, Gout, Osteoarthritis Clustered Wound: No Photos Wound Measurements Length: (cm) 0.4 % Red Width: (cm) 4 % Red Depth: (cm) 0.4 Epith Area: (cm) 1.257 Tunn Volume: (cm) 0.503 Unde uction in Area: -344.2% uction in Volume: -256.7% elialization: None eling: No rmining: No Wound  Description Classification: Grade 3 Foul Wagner Verification: MRI Slou Wound Margin: Flat and Intact Exudate Amount: Medium Exudate Type: Serosanguineous Exudate Color: red, brown Odor After Cleansing: No gh/Fibrino Yes Wound Bed Granulation Amount: Large (67-100%) Exposed Structure Granulation Quality: Pink, Pale Fascia Exposed: No Necrotic Amount: Small (1-33%) Fat Layer (Subcutaneous Tissue) Exposed: Yes Necrotic Quality: Adherent Slough Tendon Exposed: No Muscle Exposed: No Joint Exposed: No Bone Exposed: No Treatment Notes Wound #2 (Toe Great) Wound Laterality: Plantar, Left Cleanser Peri-Wound Care Gene Brown, Gene Brown ([id number]) Topical Primary Dressing Hydrofera Blue Ready Transfer Foam, 4x5 (in/in) Discharge Instruction: double layer cut to size of wound Secondary Dressing Gauze Discharge Instruction: As directed: dry, moistened with saline or moistened with Dakins Solution Secured With Medipore Tape - 71M Medipore H Soft Cloth Surgical Tape, 2x2 (in/yd) Compression Wrap Compression Stockings Add-Ons Electronic Signature(s) Signed: 04/18/2022 11:17:50 AM By: Yevonne Pax RN Entered By: Yevonne Pax on 04/18/2022 11:17:50 Gene Brown ([id number]) -------------------------------------------------------------------------------- Vitals Details Patient Name: Gene Brown Date of Service: 04/18/2022 10:15 AM Medical Record Number: [id number] Patient Account Number: [id number] Date of Birth/Sex: 1951/03/19 (71 y.o. M) Treating RN: Yevonne Pax Primary Care Marquest Gunkel: Barbette Reichmann Other Clinician: Referring Miela Desjardin: Barbette Reichmann Treating Thurston Brendlinger/Extender: Rowan Blase in Treatment: 14 Vital Signs Time Taken: 10:03 Temperature (F): 97.9 Height (in): 68 Pulse (bpm): 75 Weight (lbs): 217 Respiratory Rate (breaths/min): 18 Body Mass Index (BMI): 33 Blood  Pressure (mmHg): 167/73 Reference Range: 80 - 120 mg / dl Electronic  Signature(s) Signed: 04/18/2022 4:14:55 PM By: Yevonne Pax RN Entered By: Yevonne Pax on 04/18/2022 10:03:42

## 2022-04-18 NOTE — Progress Notes (Signed)
STEN, DEMATTEO ([id number]) Visit Report for 04/18/2022 Chief Complaint Document Details Patient Name: Gene Brown, Gene Brown Date of Service: 04/18/2022 10:15 AM Medical Record Number: [id number] Patient Account Number: [id number] Date of Birth/Sex: May 07, 1951 (71 y.o. M) Treating RN: Yevonne Pax Primary Care Provider: Barbette Reichmann Other Clinician: Referring Provider: Barbette Reichmann Treating Provider/Extender: Rowan Blase in Treatment: 14 Information Obtained from: Patient Chief Complaint Left great toe ulcer Electronic Signature(s) Signed: 04/18/2022 9:57:49 AM By: Lenda Kelp PA-C Entered By: Lenda Kelp on 04/18/2022 09:57:49 Gene Brown ([id number]) -------------------------------------------------------------------------------- Debridement Details Patient Name: Gene Brown Date of Service: 04/18/2022 10:15 AM Medical Record Number: [id number] Patient Account Number: [id number] Date of Birth/Sex: 06-05-1951 (71 y.o. M) Treating RN: Yevonne Pax Primary Care Provider: Barbette Reichmann Other Clinician: Referring Provider: Barbette Reichmann Treating Provider/Extender: Rowan Blase in Treatment: 14 Debridement Performed for Wound #2 Left,Plantar Toe Great Assessment: Performed By: Physician Nelida Meuse., PA-C Debridement Type: Debridement Severity of Tissue Pre Debridement: Fat layer exposed Level of Consciousness (Pre- Awake and Alert procedure): Pre-procedure Verification/Time Out Yes - 10:25 Taken: Start Time: 10:25 Total Area Debrided (L x W): 1 (cm) x 1 (cm) = 1 (cm) Tissue and other material Viable, Non-Viable, Slough, Subcutaneous, Skin: Dermis , Skin: Epidermis, Slough debrided: Level: Skin/Subcutaneous Tissue Debridement Description: Excisional Instrument: Curette Bleeding: Moderate Hemostasis Achieved: Pressure End Time: 10:31 Procedural Pain: 0 Post Procedural Pain: 0 Response to Treatment: Procedure was tolerated  well Level of Consciousness (Post- Awake and Alert procedure): Post Debridement Measurements of Total Wound Length: (cm) 0.4 Width: (cm) 4 Depth: (cm) 0.4 Volume: (cm) 0.503 Character of Wound/Ulcer Post Debridement: Improved Severity of Tissue Post Debridement: Fat layer exposed Post Procedure Diagnosis Same as Pre-procedure Electronic Signature(s) Unsigned Entered ByYevonne Pax on 04/18/2022 10:31:31 Signature(s): Date(s): Gene Brown ([id number]) -------------------------------------------------------------------------------- Physician Orders Details Patient Name: Gene Brown Date of Service: 04/18/2022 10:15 AM Medical Record Number: [id number] Patient Account Number: [id number] Date of Birth/Sex: April 15, 1951 (71 y.o. 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 o Nurse Visit as needed Bathing/ Shower/ Hygiene o May shower; gently cleanse wound with antibacterial soap, rinse and pat dry prior to dressing wounds o No tub bath. Anesthetic (Use 'Patient Medications' Section for Anesthetic Order Entry) o Lidocaine applied to wound bed Edema Control - Lymphedema / Segmental Compressive Device / Other o Patient to wear own Velcro compression garment. Remove compression stockings every night before going to bed and put on every morning when getting up. - patient needs juxtalite or equivalent velcro wrap bilat lower  extremities due to chronic venous insufficiency and stage 3 lympthedema o Compression Pump: Use compression pump on left lower extremity for 60 minutes, twice daily. o Compression Pump: Use compression pump on right lower extremity for 60 minutes, twice daily. o DO YOUR BEST to sleep in the bed at night. DO NOT sleep in your recliner. Long hours of sitting in a recliner leads to swelling of the legs and/or potential wounds on your backside. Off-Loading o Offloading felt to foot. - donut cut in  offloading felt o Other: - front off-loader left Additional Orders / Instructions o Follow Nutritious Diet and Increase Protein Intake o Activity as tolerated Wound Treatment Wound #2 - Toe Great Wound Laterality: Plantar, Left Primary Dressing: Hydrofera Blue Ready Transfer Foam, 4x5 (in/in) 1 x Per Day/15 Days Discharge Instructions: double layer cut to size of wound Secondary Dressing: Gauze 1 x Per Day/15 Days Discharge Instructions: As directed: dry, moistened with saline or moistened with Dakins Solution Secured With: Medipore Tape - 14M Medipore H Soft Cloth Surgical Tape, 2x2 (in/yd) 1 x Per Day/15 Days Electronic Signature(s) Unsigned Entered ByYevonne Pax on 04/18/2022 10:27:37 Signature(s): Date(s): Gene Brown ([name] ([id number]) -------------------------------------------------------------------------------- Prescription 04/18/2022 Patient Name: Gene Brown Provider: Allen Derry PA-C Date of Birth: 01-31-1951 NPI#: [id number] Sex: M DEA#: [id number] Phone #: [phone] License #: Patient Address: [facility] [address] [facility] [address] [facility] [place], [place] [address] [address], [address] [place], [place] [address] [phone] Allergies Statins-Hmg-Coa Reductase Inhibitors Provider's Orders Patient to wear own Velcro compression garment. Remove compression stockings every  night before going to bed and put on every morning when getting up. - patient needs juxtalite or equivalent velcro wrap bilat lower extremities due to chronic venous insufficiency and stage 3 lympthedema Hand Signature: Date(s): Electronic Signature(s) Unsigned Entered ByYevonne Pax on 04/18/2022 10:27:37 Signature(s): Date(s): Gene Brown ([id number]) --------------------------------------------------------------------------------  Problem List Details Patient Name: Gene Brown Date of Service: 04/18/2022 10:15 AM Medical Record Number: [id number] Patient Account Number: [id number] Date of Birth/Sex: [birth year] (71 y.o. 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

## 2022-04-20 NOTE — Progress Notes (Signed)
ROMELO, SCIANDRA ([id number]) Visit Report for 04/18/2022 HBO Patient Questionnaire Details Patient Name: Gene Brown, Gene Brown Date of Service: 04/18/2022 10:15 AM Medical Record Number: [id number] Patient Account Number: [id number] Date of Birth/Sex: [birth year] (71 y.o. M) Treating RN: Yevonne Pax Primary Care Lashun Mccants: Barbette Reichmann Other Clinician: Referring Greta Yung: Barbette Reichmann Treating Clotile Whittington/Extender: Rowan Blase in Treatment: 14 HBO Patient Questionnaire Items Answer Any "Yes" answers must be brought to the hyperbaric physician's attention. Breathing or Lung problemso No Currently use tobacco productso No Used tobacco products in the pasto No Heart problemso No Do you take water pills (diuretic)o No Diabeteso Yes On Diabetes pillo Yes If yes, last time taken: 04/20/22 On Insulino Yes If yes, last time taken: 04/20/22 Dialysiso No Eye problems like glaucomao No Ear problems or surgeryo Yes Sinus Problemso No Cancero Yes List the location: nose Surgery(s) for cancero Yes Describe the surgery (s): lesion removed Radiation therapy for cancero No Chemotherapy for cancero No Confinement Anxiety (Claustrophobia- fear of confined places)o No Any medical implants/devices that are fully or partially implanted or attached to your bodyo Yes Pregnanto No Seizureso No Notes screw left knee Electronic Signature(s) Signed: 04/20/2022 8:18:17 AM By: Yevonne Pax RN Entered By: Yevonne Pax on 04/20/2022 08:18:17

## 2022-04-25 ENCOUNTER — Encounter: Payer: Medicare Other | Admitting: Physician Assistant

## 2022-04-25 DIAGNOSIS — E11621 Type 2 diabetes mellitus with foot ulcer: Secondary | ICD-10-CM | POA: Diagnosis not present

## 2022-04-25 NOTE — Progress Notes (Addendum)
KIONTE, BAUMGARDNER ([id number]) Visit Report for 04/25/2022 Chief Complaint Document Details Patient Name: Gene Brown, Gene Brown Date of Service: 04/25/2022 12:45 PM Medical Record Number: [id number] Patient Account Number: [id number] Date of Birth/Sex: 08-05-1951 (71 y.o. M) Treating RN: Yevonne Pax Primary Care Provider: Barbette Reichmann Other Clinician: Betha Loa Referring Provider: Barbette Reichmann Treating Provider/Extender: Rowan Blase in Treatment: 15 Information Obtained from: Patient Chief Complaint Left great toe ulcer Electronic Signature(s) Signed: 04/25/2022 12:52:45 PM By: Lenda Kelp PA-C Entered By: Lenda Kelp on 04/25/2022 12:52:45 Gene Brown ([id number]) -------------------------------------------------------------------------------- Debridement Details Patient Name: Gene Brown Date of Service: 04/25/2022 12:45 PM Medical Record Number: [id number] Patient Account Number: [id number] Date of Birth/Sex: July 21, 1951 (71 y.o. M) Treating RN: Yevonne Pax Primary Care Provider: Barbette Reichmann Other Clinician: Betha Loa Referring Provider: Barbette Reichmann Treating Provider/Extender: Rowan Blase in Treatment: 15 Debridement Performed for Wound #2 Left,Plantar Toe Great Assessment: Performed By: Physician Nelida Meuse., PA-C Debridement Type: Debridement Severity of Tissue Pre Debridement: Fat layer exposed Level of Consciousness (Pre- Awake and Alert procedure): Pre-procedure Verification/Time Out Yes - 13:14 Taken: Start Time: 13:14 Total Area Debrided (L x W): 0.5 (cm) x 0.5 (cm) = 0.25 (cm) Tissue and other material Viable, Non-Viable, Callus, Slough, Subcutaneous, Slough debrided: Level: Skin/Subcutaneous Tissue Debridement Description: Excisional Instrument: Curette Bleeding: Minimum Hemostasis Achieved: Pressure End Time: 13:16 Response to Treatment: Procedure was tolerated well Level of Consciousness  (Post- Awake and Alert procedure): Post Debridement Measurements of Total Wound Length: (cm) 0.2 Width: (cm) 0.1 Depth: (cm) 0.4 Volume: (cm) 0.006 Character of Wound/Ulcer Post Debridement: Stable Severity of Tissue Post Debridement: Fat layer exposed Post Procedure Diagnosis Same as Pre-procedure Electronic Signature(s) Signed: 04/25/2022 2:31:54 PM By: Yevonne Pax RN Signed: 04/25/2022 4:49:05 PM By: Lenda Kelp PA-C Signed: 04/28/2022 11:41:41 AM By: Betha Loa Entered By: Betha Loa on 04/25/2022 13:19:42 Gene Brown ([id number]) -------------------------------------------------------------------------------- Debridement Details Patient Name: Gene Brown Date of Service: 04/25/2022 12:45 PM Medical Record Number: [id number] Patient Account Number: [id number] Date of Birth/Sex: [birth year] (71 y.o. M) Treating RN: Yevonne Pax Primary Care Provider: Barbette Reichmann Other Clinician: Betha Loa Referring Provider: Barbette Reichmann Treating Provider/Extender: Rowan Blase in Treatment: 15 Debridement Performed for Wound #3 Right,Dorsal Toe Great Assessment: Performed By: Physician Nelida Meuse., PA-C Debridement Type: Debridement Severity of Tissue Pre Debridement: Fat layer exposed Level of Consciousness (Pre- Awake and Alert procedure): Pre-procedure Verification/Time Out Yes - 13:15 Taken: Start Time: 13:15 Total Area Debrided (L x W): 0.3 (cm) x 0.4 (cm) = 0.12 (cm) Tissue and other material Viable, Non-Viable, Callus, Slough, Subcutaneous, Slough debrided: Level: Skin/Subcutaneous Tissue Debridement Description: Excisional Instrument: Curette Bleeding: Minimum Hemostasis Achieved: Pressure End Time: 13:16 Response to Treatment: Procedure was tolerated well Level of Consciousness (Post- Awake and Alert procedure): Post Debridement Measurements of Total Wound Length: (cm) 0.3 Width: (cm) 0.4 Depth: (cm) 0.8 Volume: (cm)  0.075 Character of Wound/Ulcer Post Debridement: Stable Severity of Tissue Post Debridement: Fat layer exposed Post Procedure Diagnosis Same as Pre-procedure Electronic Signature(s) Signed: 04/25/2022 2:31:54 PM By: Yevonne Pax RN Signed: 04/25/2022 4:49:05 PM By: Lenda Kelp PA-C Signed: 04/28/2022 11:41:41 AM By: Betha Loa Entered By: Betha Loa on 04/25/2022 13:20:44 Gene Brown ([id number]) -------------------------------------------------------------------------------- HPI Details Patient Name: Gene Brown Date of Service: 04/25/2022 12:45 PM Medical Record Number: [id number] Patient Account Number: [id number] Date of Birth/Sex: [birth year] (71 y.o. M) Treating RN: Yevonne Pax Primary Care Provider: Barbette Reichmann Other Clinician: Betha Loa  Referring Provider: Barbette Reichmann Treating Provider/Extender: Rowan Blase in Treatment: 15 History of Present Illness HPI Description: 05/24/16; this is a 71 year old type II diabetic who probably has some degree of polyneuropathy. He tells me that he has had an area on the plantar aspect of his left great toe that is been followed by podiatry for most the last year. He has a lot of callus, the podiatrist which shave this down put a dressing on and he would follow pad monthly or sometimes longer intervals. He has not noted any drainage or pain. He has not had a recent x-ray. No serious attempt at offloading this with modified footwear.. Last hemoglobin A1c that he knows about was over 10 a month ago he is working hard on this with his primary physician. More recently he has been soaking this with peroxide ABIs are noncompressible and the left leg. He was told by his podiatrist he has a bone spur underneath the wound but did not wish any surgical procedures. He has a caregiver at home for a very disabled wife, the nature of her illness is not certain 05/31/16 patient comes in much the same as last week amounts  of callus nonviable subcutaneous tissue which required debridement. His x-ray suggested cortical bone irregularity. He will need an MRI. He tells me he has old screws/hardware left knee however he thinks he has had a subsequent MRI after this 06/07/16 at this point in time patient has been attempting to offload his wound as much as he could on his own. He has made a conscious effort to avoid placing any pressure on the toe as he was moving around and walking and I do believe this has payed off and what I am seeing at this point in time. overall the wound appears to be somewhat improved in my opinion he is not having any significant discomfort at this point in time. In fact he tells me he has no pain at all. There is no interval sign of surrounding infection in the wound itself has actually decreased in size. Unfortunately the MRI was not ordered last week and therefore we do need to proceed with the MRI order today. again this is to evaluate the cortical bone irregularity noted on x-ray. 06/14/16 Patient has continued to use the offloading shoe at this point in time. He tells me this is somewhat difficult to get used to walking and but nonetheless he is very strict about wearing it. Again I believe this has paid off very well for him and his wound appears to be greatly improved. He does have some callused area surrounding the wound proximally that has caused some fluid collection of maceration of the callused area. This likely will require debridement today. Some adherent slough noted over the wound bed. 06/21/16; patient's wound on the plantar aspect of his left great toe is totally epithelialized. His MRI of the foot is tomorrow to follow-up on the cortical irregularity noted on plain x-ray. He does not have diabetic foot where 06-28-16-He comes in today with a continued healed left great toe. MRI performed on 06/23/2016 shows no abscess osteomyelitis or septic joint. Patient was discharged from  wound care center today. Readmission: 01-07-2022 upon evaluation today patient appears to be doing somewhat poorly in regard to total ulcer on his left foot. Fortunately there does not appear to be any signs of infection which is great news. Has been seen by Dr. Excell Seltzer and he did request from Dr. Excell Seltzer to come see [place]  due to the fact that in the past several years back we were able to get one of his wounds healed about 4 weeks. Again I explained that that is always variable but nonetheless I definitely think we can help. Fortunately I see no evidence of infection. He does have a foot offloading shoe which I think is good to attempt for now he was able to get this healed before with a peg assist. Patient does have a history of type 2 diabetes, diabetic polyneuropathy, and hypertension. This current wound started on 12-09-2021. 01-20-2022 upon evaluation today patient appears to be doing poorly in regard to his great toe. I am actually very concerned based on what I am seeing here at this point about how things are progressing. I do not believe there is any evidence of infection systemically that is obvious but locally I am concerned about the possibility this could even be an osteomyelitis type situation. 01-28-2022 upon evaluation today patient appears to be doing well with regard to his toe ulcer compared to what we were previous. Fortunately I do not see any signs of active infection at this time which is great news. No fevers, chills, nausea, vomiting, or diarrhea. With that being said the patient has not started his Augmentin yet which I definitely think he needs to get on board ASAP. I do see some signs of new skin growth I think he is using the front offloading shoe which is good for the time being. 02-04-2022 upon evaluation today patient appears to be doing okay in regard to his toe ulcer this is actually measuring a little bit smaller but still has a bit of callus buildup but not as much as we have  seen in the past. Fortunately I do not see any evidence of active infection locally or systemically which is great news. No fevers, chills, nausea, vomiting, or diarrhea. Marland Kitchen 02-14-2022 upon evaluation today patient's wound is actually showing signs of good improvement which is great news. Fortunately I do not see any evidence of active infection locally or systemically which is great news as well. No fevers, chills, nausea, vomiting, or diarrhea. 02-21-2022 upon evaluation today patient's toe ulcer actually is showing signs of excellent improvement. I do not see any evidence of infection locally or systemically which is great news and overall I am extremely pleased with where we stand today. I do not see any signs of active infection locally or systemically which is great news. No fevers, chills, nausea, vomiting, or diarrhea. 7/24; plantar left great toe no real improvement since last week. Using silver alginate gauze. He has a forefoot offloading boot. He also has lymphedema and uses his compression pumps I do not think this is adding any particular difficulties. It sounds as though he is still pretty active 03-07-2022 upon evaluation today patient appears to be doing excellent in regard to the wound compared to previous pictures. He still has a quite deep wound on the toe good news is the x-ray did not show any signs of osteomyelitis which is excellent. He does need some debridement today. 03-14-2022 upon evaluation today patient appears to be doing well currently in regard to his wound. He has actually been tolerating the dressing changes without complication. With that being said the dressing seems to either be draining a lot or getting very wet otherwise I think we need to have him change this more frequently. Gene Brown, Gene Brown ([id number]) 03-21-2022 upon evaluation today patient appears to be doing well with regard to  his toe ulcer I am actually seeing signs of improvement which is great news. This  is slow but nonetheless the skin around the edges of the wound are definitely improving as we proceed here. I do not see any evidence of active infection locally or systemically which is great news. 03-29-2022 upon evaluation today patient appears to be doing fairly well except for with regard to his toe being somewhat red. I do think that we may want to place him back on the Augmentin which was previously beneficial for him. He is not in disagreement with this. Otherwise I did perform some debridement of clearway some of the necrotic debris today he seems to be tolerating this without complication. 04-04-2022 upon evaluation today patient unfortunately is continued to have significant issues here with his toe ulcer. He actually has an abscess more proximal on the dorsal aspect of the toe which is new since last week. Subsequently he is on antibiotics already I think we need to proceed with an MRI to further evaluate this. 04-12-2022 upon evaluation today patient appears to be doing better in regard to his toe ulcer. This actually is less red than what it was he is about done with the initial antibiotic he does has his MRI scheduled for Friday. The reason I am going to probably extend the Augmentin for the time being I will to make sure that we keep this under control especially if he potentially has a bone infection I do not going to be going without the antibiotic therapy. Patient is in agreement with that plan. 04-18-2022 upon evaluation today patient does have an MRI for review that was obtained on Sunday. 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 He has been on antibiotics I had him on Augmentin starting initially on January 25, 2022. Continuing to be a problem here. I do not see any signs of infection spreading up his foot but the toe itself still has  purulent drainage coming from it. Coupled with this I have had him on the antibiotics, Augmentin, and he is tolerating that quite well. He is actually been on this since 01-20-2022. During that time his wound is getting somewhat better. But is still been very slow which is what prompted the MRI due to the fact that I felt like at a later point he started develop an abscess on the top of his toe that then had me more concerned. At this point I do believe that he is probably can require hyperbaric oxygen therapy I think that this is the ideal treatment plan for limb salvage coupled with the antibiotic therapy and good wound care. 04-25-2022 upon evaluation today patient appears to be doing okay in regard to his foot ulcers. He actually has an opening on the dorsal aspect of the toe which does not directly connect to the plantar aspect. This is still open in both locations he did have some callus covering that did require sharp debridement today to clear this away. Fortunately there does not appear to be any evidence of active infection at this point. Electronic Signature(s) Signed: 04/25/2022 4:18:54 PM By: Lenda Kelp PA-C Entered By: Lenda Kelp on 04/25/2022 16:18:54 Gene Brown ([id number]) -------------------------------------------------------------------------------- Physical Exam Details Patient Name: Gene Brown Date of Service: 04/25/2022 12:45 PM Medical Record Number: [id number] Patient Account Number: [id number]  Date of Birth/Sex: 1950/10/30 (71 y.o. M) Treating RN: Yevonne Pax Primary Care Provider: Barbette Reichmann Other Clinician: Betha Loa Referring Provider: Barbette Reichmann Treating Provider/Extender: Rowan Blase in Treatment: 15 Constitutional Well-nourished and well-hydrated in no acute distress. Respiratory normal breathing without difficulty. Psychiatric this patient is able to make decisions and demonstrates good insight into disease  process. Alert and Oriented x 3. pleasant and cooperative. Notes Upon inspection patient's wound bed actually did require sharp debridement to clearway some of the necrotic debris he tolerated that today without complication postdebridement wound bed appears to be doing much better. Electronic Signature(s) Signed: 04/25/2022 4:19:09 PM By: Lenda Kelp PA-C Entered By: Lenda Kelp on 04/25/2022 16:19:09 Gene Brown ([id number]) -------------------------------------------------------------------------------- Physician Orders Details Patient Name: Gene Brown Date of Service: 04/25/2022 12:45 PM Medical Record Number: [id number] Patient Account Number: [id number] Date of Birth/Sex: 02/19/1951 (71 y.o. M) Treating RN: Yevonne Pax Primary Care Provider: Barbette Reichmann Other Clinician: Betha Loa Referring Provider: Barbette Reichmann Treating Provider/Extender: Rowan Blase in Treatment: 15 Verbal / Phone Orders: No Diagnosis Coding ICD-10 Coding Code Description E11.621 Type 2 diabetes mellitus with foot ulcer 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osed E11.42 Type 2 diabetes mellitus with diabetic polyneuropathy I10 Essential (primary) hypertension Follow-up Appointments Wound #2 Left,Plantar Toe Great o Return Appointment in 1 week. o Nurse Visit as needed Bathing/ Shower/ Hygiene o May shower; gently cleanse wound with antibacterial soap, rinse and pat dry prior to dressing wounds o No tub bath. Anesthetic (Use 'Patient Medications' Section for Anesthetic Order Entry) o Lidocaine applied to wound bed Edema Control - Lymphedema / Segmental Compressive Device / Other o Patient to wear own Velcro compression garment. Remove compression stockings every night before going to bed and put on every morning when getting up. - patient needs juxtalite or equivalent velcro wrap bilat lower extremities due to  chronic venous insufficiency and stage 3 lympthedema o Compression Pump: Use compression pump on left lower extremity for 60 minutes, twice daily. o Compression Pump: Use compression pump on right lower extremity for 60 minutes, twice daily. o DO YOUR BEST to sleep in the bed at night. DO NOT sleep in your recliner. Long hours of sitting in a recliner leads to swelling of the legs and/or potential wounds on your backside. Off-Loading o Other: - front off-loader left Additional Orders / Instructions o Follow Nutritious Diet and Increase Protein Intake o Activity as tolerated Hyperbaric Oxygen Therapy o Evaluate for HBO Therapy - Left great toe osteomyelitis o Indication and location: - CRO Left great toe o If appropriate for treatment, begin HBOT per protocol: o 2.0 ATA for 90 Minutes without Air Breaks o One treatment per day (delivered Monday through Friday unless otherwise specified in Special Instructions below): o Total # of Treatments: - 40 o Finger stick Blood Glucose Pre- and Post- HBOT Treatment. o Follow Hyperbaric Oxygen Gene Protocol Wound Treatment Wound #2 - Toe Great Wound Laterality: Plantar, Left Primary Dressing: Hydrofera Blue Ready Transfer Foam, 4x5 (in/in) 1 x Per Day/15 Days Discharge Instructions: double layer cut to size of wound Secondary Dressing: Gauze 1 x Per Day/15 Days Discharge Instructions: As directed: dry, moistened with saline or moistened with Dakins Solution Secured With: Medipore Tape - 30M Medipore H Soft Cloth Surgical Tape, 2x2 (in/yd) 1 x Per Day/15 Days Gene Brown, Gene Brown ([id number]) Wound #3 - Toe Great Wound Laterality: Dorsal, Left Primary Dressing: Hydrofera Blue Ready Transfer Foam,  4x5 (in/in) 1 x Per Day/15 Days Discharge Instructions: double layer cut to size of wound Secondary Dressing: Gauze 1 x Per Day/15 Days Discharge Instructions: As directed: dry, moistened with saline or moistened with Dakins  Solution Secured With: Medipore Tape - 88M Medipore H Soft Cloth Surgical Tape, 2x2 (in/yd) 1 x Per Day/15 Days Gene Brown the hospital definition of a critical result, follow hospital policy. A. Give patient an 8 ounce Glucerna Shake, an 8 ounce Ensure, or 8 ounces of a Glucerna/Ensure equivalent dietary supplement*. B. Wait 30 minutes. If result is 71 mg/dl to 161130 mg/dl: C. Retest patientos capillary blood glucose (CBG). D. If result greater than or equal to 110 mg/dl, proceed with HBO. If result less than 110 mg/dl, notify HBO physician and consider holding HBO. If result is 131 mg/dl to 096249 mg/dl: A. Proceed with HBO. A. Notify HBO physician and await physician orders. B. It is recommended to hold HBO and do If result is 250 mg/dl or greater: blood/urine ketone testing. C. If the result Brown the hospital definition of a critical result, follow hospital policy. POST-HBO Gene INTERVENTIONS ACTION INTERVENTION Obtain post HBO capillary blood glucose (ensure 1 physician order is in chart). A. Notify HBO physician and await physician orders. 2 If result is 70 mg/dl or below: B. If the result Brown the hospital definition of a critical result, follow hospital policy. A. Give patient an 8 ounce Glucerna Shake, an 8 ounce Ensure, or 8 ounces of a Glucerna/Ensure equivalent dietary supplement*. B. Wait 15 minutes for symptoms of hypoglycemia (i.e. nervousness, anxiety, If result is 71 mg/dl to 045100 mg/dl: sweating, chills, clamminess, irritability, confusion, tachycardia or dizziness). C. If patient asymptomatic, discharge patient. If patient symptomatic, repeat capillary blood glucose (CBG) and notify  HBO physician. If result is 101 mg/dl to 409249 mg/dl: A. Discharge patient. A. Notify HBO physician and await physician orders. B. It is recommended to do blood/urine If result is 250 mg/dl or greater: ketone testing. C. If the result Brown the hospital definition of a critical result, follow hospital policy. *Juice or candies are NOT equivalent products. If patient refuses the Glucerna or Ensure, please consult the hospital dietitian for an appropriate substitute. Electronic Signature(s) Signed: 05/06/2022 12:21:40 PM By: Yevonne PaxEpps, Carrie RN Signed: 05/06/2022 12:26:05 PM By: Lenda KelpStone III, Khadeem Rockett PA-C Previous Signature: 04/25/2022 4:49:05 PM Version By: Rosalin HawkingStone III, Dalesha Stanback PA-C Latu, EllicottPAUL V. ([id number]) Entered By: Yevonne PaxEpps, Carrie on 05/02/2022 13:45:19 Gene BangGRIBBONS, Joren V. ([id number]) -------------------------------------------------------------------------------- Prescription 04/25/2022 Patient Name: Gene BangGRIBBONS, Teodor V. Provider: Allen DerryStone, Dante Cooter PA-C Date of Birth: 05/12/1951 NPI#: [id number] Sex: Judie PetitM DEA#: [id number] Phone #: [phone] License #: Patient Address: [facility] [address] [facility] [place] [address] [address], [address] [place], [place] [address] [phone] Allergies Statins-Hmg-Coa Reductase Inhibitors Provider's Orders Patient to wear own Velcro compression garment. Remove compression stockings every night before going to bed and put on every morning when getting up. - patient needs juxtalite or equivalent velcro wrap bilat lower extremities due to chronic venous insufficiency and stage 3 lympthedema Hand Signature: Date(s): Electronic Signature(s) Signed: 05/06/2022 12:21:40 PM By: Yevonne PaxEpps, Carrie RN Signed: 05/06/2022 12:26:05 PM By: Lenda KelpStone III, Jihan Mellette PA-C Previous Signature: 04/25/2022 4:49:05 PM Version By: Lenda KelpStone III, Rhiley Tarver PA-C  Entered By: Yevonne Pax on 05/02/2022 13:45:19 Gene Brown  ([id number]) --------------------------------------------------------------------------------  Problem List Details Patient Name: Gene Brown Date of Service: 04/25/2022 12:45 PM Medical Record Number: [id number] Patient Account Number: [id number] Date of Birth/Sex: 08-14-1950 (71 y.o. M) Treating RN: Yevonne Pax Primary Care Provider: Barbette Reichmann Other Clinician: Betha Loa Referring Provider: Barbette Reichmann Treating Provider/Extender: Rowan Blase in Treatment: 15 Active Problems ICD-10 Encounter Code Description Active Date MDM Diagnosis [phone] Other chronic osteomyelitis, left ankle and foot 04/25/2022 No Yes E11.621 Type 2 diabetes mellitus with foot ulcer 01/07/2022 No Yes L97.522 Non-pressure chronic ulcer of other part of left foot with fat layer 01/07/2022 No Yes exposed E11.42 Type 2 diabetes mellitus with diabetic polyneuropathy 01/07/2022 No Yes I10 Essential (primary) hypertension 01/07/2022 No Yes Inactive Problems Resolved Problems Electronic Signature(s) Signed: 04/25/2022 4:23:28 PM By: Lenda Kelp PA-C Previous Signature: 04/25/2022 12:52:40 PM Version By: Lenda Kelp PA-C Entered By: Lenda Kelp on 04/25/2022 16:23:27 Gene Brown ([id number]) -------------------------------------------------------------------------------- Progress Note Details Patient Name: Gene Brown Date of Service: 04/25/2022 12:45 PM Medical Record Number: [id number] Patient Account Number: [id number] Date of Birth/Sex: Dec 21, 1950 (71 y.o. M) Treating RN: Yevonne Pax Primary Care Provider: Barbette Reichmann Other Clinician: Betha Loa Referring Provider: Barbette Reichmann Treating Provider/Extender: Rowan Blase in Treatment: 15 Subjective Chief Complaint Information obtained from Patient Left great toe ulcer History of Present Illness (HPI) 05/24/16; this is a 71 year old type II diabetic who probably has some degree of polyneuropathy. He  tells me that he has had an area on the plantar aspect of his left great toe that is been followed by podiatry for most the last year. He has a lot of callus, the podiatrist which shave this down put a dressing on and he would follow pad monthly or sometimes longer intervals. He has not noted any drainage or pain. He has not had a recent x-ray. No serious attempt at offloading this with modified footwear.. Last hemoglobin A1c that he knows about was over 10 a month ago he is working hard on this with his primary physician. More recently he has been soaking this with peroxide ABIs are noncompressible and the left leg. He was told by his podiatrist he has a bone spur underneath the wound but did not wish any surgical procedures. He has a caregiver at home for a very disabled wife, the nature of her illness is not certain 05/31/16 patient comes in much the same as last week amounts of callus nonviable subcutaneous tissue which required debridement. His x-ray suggested cortical bone irregularity. He will need an MRI. He tells me he has old screws/hardware left knee however he thinks he has had a subsequent MRI after this 06/07/16 at this point in time patient has been attempting to offload his wound as much as he could on his own. He has made a conscious effort to avoid placing any pressure on the toe as he was moving around and walking and I do believe this has payed off and what I am seeing at this point in time. overall the wound appears to be somewhat improved in my opinion he is not having any significant discomfort at this point in time. In fact he tells me he has no pain at all. There is no interval sign of surrounding infection in the wound itself has actually decreased in size. Unfortunately the MRI was not ordered last week and therefore we do need to proceed with the MRI order today.  again this is to evaluate the cortical bone irregularity noted on x-ray. 06/14/16 Patient has continued to use  the offloading shoe at this point in time. He tells me this is somewhat difficult to get used to walking and but nonetheless he is very strict about wearing it. Again I believe this has paid off very well for him and his wound appears to be greatly improved. He does have some callused area surrounding the wound proximally that has caused some fluid collection of maceration of the callused area. This likely will require debridement today. Some adherent slough noted over the wound bed. 06/21/16; patient's wound on the plantar aspect of his left great toe is totally epithelialized. His MRI of the foot is tomorrow to follow-up on the cortical irregularity noted on plain x-ray. He does not have diabetic foot where 06-28-16-He comes in today with a continued healed left great toe. MRI performed on 06/23/2016 shows no abscess osteomyelitis or septic joint. Patient was discharged from wound care center today. Readmission: 01-07-2022 upon evaluation today patient appears to be doing somewhat poorly in regard to total ulcer on his left foot. Fortunately there does not appear to be any signs of infection which is great news. Has been seen by Dr. Excell Seltzer and he did request from Dr. Excell Seltzer to come see [place] due to the fact that in the past several years back we were able to get one of his wounds healed about 4 weeks. Again I explained that that is always variable but nonetheless I definitely think we can help. Fortunately I see no evidence of infection. He does have a foot offloading shoe which I think is good to attempt for now he was able to get this healed before with a peg assist. Patient does have a history of type 2 diabetes, diabetic polyneuropathy, and hypertension. This current wound started on 12-09-2021. 01-20-2022 upon evaluation today patient appears to be doing poorly in regard to his great toe. I am actually very concerned based on what I am seeing here at this point about how things are progressing. I do not  believe there is any evidence of infection systemically that is obvious but locally I am concerned about the possibility this could even be an osteomyelitis type situation. 01-28-2022 upon evaluation today patient appears to be doing well with regard to his toe ulcer compared to what we were previous. Fortunately I do not see any signs of active infection at this time which is great news. No fevers, chills, nausea, vomiting, or diarrhea. With that being said the patient has not started his Augmentin yet which I definitely think he needs to get on board ASAP. I do see some signs of new skin growth I think he is using the front offloading shoe which is good for the time being. 02-04-2022 upon evaluation today patient appears to be doing okay in regard to his toe ulcer this is actually measuring a little bit smaller but still has a bit of callus buildup but not as much as we have seen in the past. Fortunately I do not see any evidence of active infection locally or systemically which is great news. No fevers, chills, nausea, vomiting, or diarrhea. Marland Kitchen 02-14-2022 upon evaluation today patient's wound is actually showing signs of good improvement which is great news. Fortunately I do not see any evidence of active infection locally or systemically which is great news as well. No fevers, chills, nausea, vomiting, or diarrhea. 02-21-2022 upon evaluation today patient's toe ulcer actually  is showing signs of excellent improvement. I do not see any evidence of infection locally or systemically which is great news and overall I am extremely pleased with where we stand today. I do not see any signs of active infection locally or systemically which is great news. No fevers, chills, nausea, vomiting, or diarrhea. 7/24; plantar left great toe no real improvement since last week. Using silver alginate gauze. He has a forefoot offloading boot. He also has lymphedema and uses his compression pumps I do not think this is  adding any particular difficulties. It sounds as though he is still pretty active 03-07-2022 upon evaluation today patient appears to be doing excellent in regard to the wound compared to previous pictures. He still has a quite GAVAN, NORDBY. ([id number]) deep wound on the toe good news is the x-ray did not show any signs of osteomyelitis which is excellent. He does need some debridement today. 03-14-2022 upon evaluation today patient appears to be doing well currently in regard to his wound. He has actually been tolerating the dressing changes without complication. With that being said the dressing seems to either be draining a lot or getting very wet otherwise I think we need to have him change this more frequently. 03-21-2022 upon evaluation today patient appears to be doing well with regard to his toe ulcer I am actually seeing signs of improvement which is great news. This is slow but nonetheless the skin around the edges of the wound are definitely improving as we proceed here. I do not see any evidence of active infection locally or systemically which is great news. 03-29-2022 upon evaluation today patient appears to be doing fairly well except for with regard to his toe being somewhat red. I do think that we may want to place him back on the Augmentin which was previously beneficial for him. He is not in disagreement with this. Otherwise I did perform some debridement of clearway some of the necrotic debris today he seems to be tolerating this without complication. 04-04-2022 upon evaluation today patient unfortunately is continued to have significant issues here with his toe ulcer. He actually has an abscess more proximal on the dorsal aspect of the toe which is new since last week. Subsequently he is on antibiotics already I think we need to proceed with an MRI to further evaluate this. 04-12-2022 upon evaluation today patient appears to be doing better in regard to his toe ulcer. This actually  is less red than what it was he is about done with the initial antibiotic he does has his MRI scheduled for Friday. The reason I am going to probably extend the Augmentin for the time being I will to make sure that we keep this under control especially if he potentially has a bone infection I do not going to be going without the antibiotic therapy. Patient is in agreement with that plan. 04-18-2022 upon evaluation today patient does have an MRI for review that was obtained on Sunday. 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 He has been on antibiotics I had him on Augmentin starting initially on January 25, 2022. Continuing to be a problem here. I do not see any signs of infection spreading up his foot but the toe itself still has purulent drainage coming from it. Coupled with this I have had him  on the antibiotics, Augmentin, and he is tolerating that quite well. He is actually been on this since 01-20-2022. During that time his wound is getting somewhat better. But is still been very slow which is what prompted the MRI due to the fact that I felt like at a later point he started develop an abscess on the top of his toe that then had me more concerned. At this point I do believe that he is probably can require hyperbaric oxygen therapy I think that this is the ideal treatment plan for limb salvage coupled with the antibiotic therapy and good wound care. 04-25-2022 upon evaluation today patient appears to be doing okay in regard to his foot ulcers. He actually has an opening on the dorsal aspect of the toe which does not directly connect to the plantar aspect. This is still open in both locations he did have some callus covering that did require sharp debridement today to clear this away. Fortunately there does not appear to be any evidence of active infection at  this point. Objective Constitutional Well-nourished and well-hydrated in no acute distress. Vitals Time Taken: 12:52 PM, Height: 68 in, Weight: 217 lbs, BMI: 33, Temperature: 97.8 F, Pulse: 68 bpm, Respiratory Rate: 18 breaths/min, Blood Pressure: 144/75 mmHg. Respiratory normal breathing without difficulty. Psychiatric this patient is able to make decisions and demonstrates good insight into disease process. Alert and Oriented x 3. pleasant and cooperative. General Notes: Upon inspection patient's wound bed actually did require sharp debridement to clearway some of the necrotic debris he tolerated that today without complication postdebridement wound bed appears to be doing much better. Integumentary (Hair, Skin) Wound #2 status is Open. Original cause of wound was Gradually Appeared. The date acquired was: 12/09/2021. The wound has been in treatment 15 weeks. The wound is located on the [employer]. The wound measures 0.2cm length x 0.1cm width x 0.4cm depth; 0.016cm^2 area and 0.006cm^3 volume. There is Fat Layer (Subcutaneous Tissue) exposed. There is no tunneling or undermining noted. There is a medium amount of serosanguineous drainage noted. The wound margin is flat and intact. There is large (67-100%) pink, pale granulation within the wound bed. There is a small (1-33%) amount of necrotic tissue within the wound bed including Adherent Slough. Wound #3 status is Open. Original cause of wound was Pressure Injury. The date acquired was: 03/25/2022. The wound is located on the [employer]. The wound measures 0.3cm length x 0.4cm width x 0.8cm depth; 0.094cm^2 area and 0.075cm^3 volume. There is Fat Layer (Subcutaneous Tissue) exposed. There is no tunneling or undermining noted. There is a medium amount of serosanguineous drainage noted. The wound margin is distinct with the outline attached to the wound base. There is small (1-33%) red granulation within the wound  bed. There is a large (67-100%) amount of necrotic tissue within the wound bed including Adherent Slough. 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 This is a wound that we have been attempting to manage since January 07, 2022. Unfortunately this wound is not responding to initial wound care including oral antibiotics for greater than 30 days in the setting of appropriate  wound practices including offloading. Therefore I am seeking HBO therapy approval in order to aid in getting closure and achieving limb salvage. Despite being on the appropriate antibiotic therapy the patient's wound has continued to deteriorate including an opening on the dorsal surface of the toe as well which extends down to what appears to likely be the joint where there is a septic joint noted on MRI. Subsequently I think this represents a chronic refractory osteomyelitis which is not responding as appropriately as we would like to see to antibiotics and I do believe the patient would benefit from HBO therapy as an adjunct therapy to help heal this wound and prevent amputation. I do believe that this has been present in the toe prior to 04-15-2022 when the patient actually had the MRI finally completed. This MRI shows that the entirety of the toe is affected not just an early site on the tip. This is also not an ambiguous finding where they are questioning whether or not osteomyelitis is present which would often be seen in early osteomyelitis. This has spread all the way from the wound on the tip of the toe  to the proximal portion of the toe and includes a septic joint. Site of CRO The left great toe is the site affected currently. Today's measurement was 0.2 x 0.1 with a depth of 0.4. With that being said once this was debrided it was larger and still has significant depth along with the newly opened area on the dorsal aspect of the toe as well which is also part of this infectious process. Imaging studies to support CRO Patient had an MRI which shows findings consistent with osteomyelitis throughout the proximal and distal phalanges of the great toe and a septic IP joint of the great toe. Antibiotic outcome Patient had a wound culture obtained on 01-20-2022 which showed evidence of abundant group B strep and few E. coli present. With that being said the patient was placed on Augmentin at that point when we receive the culture back and that was 01-25-2022. He has been on the antibiotics consistently since that time until current. I actually still have him on the Augmentin 875 mg / 125 mg taken 2 times per day which was re- prescribed on April 12, 2022 for a 30-day supply and another additional refill where I expect him to be on this for at least the next 2 months. The antibiotics do seem to be helping but the wound is still stubbornly resisting improvement to closure and I am concerned about limb salvage. Surgical resection outcome Patient has not had bone debridement as there is not direct bone exposure from which I can obtain of sample. The wound has been debrided but only the removal of necrotic tissue down to subcutaneous tissue has been performed. MRI results As documented above the patient's MRI which was performed on 04-17-2022 showed evidence consistent with osteomyelitis throughout the proximal and distal phalanges of the great toe on the left side and a septic IP joint of this left great toe. This was not identified in the initial x-rays despite the fact that I suspected from the initial  x-ray in June that there may be something more going on. Nonetheless the patient continued to have ongoing issues with infection from the point of June 20 through the time when I actually had him undergo the MRI. This MRI was initially ordered on 04-04-2022 due to concerns of worsening infection in regard to the toe. This is despite the patient having  been on antibiotics. Nonetheless when the results were finally completed it did indeed show that the patient did not have just a very slight infection or even "early infection" but rather has osteomyelitis of the entirety of his toe extending from the proximal portion of the toe all the way to the distal end including a septic joint in between. This is evidence that the infection has been present for much longer and the fact that the x-ray did not initially show this is not unusual as x-rays are not definitive in determining osteomyelitis or even a septic joint. I do believe this MRI finding is consistent with a much more significant infection that is indeed a chronic refractory osteomyelitis and I am concerned that we need to get this taken care of as soon as possible to prevent amputation. My professional opinion is that this has been an ongoing infection since January 20, 2022. HALLIE, ISHIDA ([id number]) Labs reviewed On the review of patient's CBC is most recent ordered was on 02-21-2022 and revealed that he did have a hemoglobin of 11.3 and hematocrit of 33.4. This is still consistent with appropriate levels to be able to expect HBO therapy to appropriately aid in healing this wound. The remainder of the CBC was essentially within normal limits. Upon review of the patient's comprehensive metabolic panel which is a more recent test his glucose was 65 and otherwise all other remaining values were within normal limits. This is actually an improvement in his glucose levels as his otherwise most recent hemoglobin A1c was 9.1 and that was on 02-21-2022. I am  very pleased to see these fasting levels down. This should aid in helping to heal the wound much more rapidly now that his blood sugars are under appropriate control. Plan of care/Summary At this time I am continuing with the conventional wound care treatment which includes Hydrofera Blue rope dressing to be used in order to pack into the wounds on the dorsal aspect of the left great toe as well as the plantar aspect. This does seem to be helping and combining this with the Augmentin which I still have the patient on and plan to continue him on for the next 2 months to help manage the infection I think we are seeing some marginal improvement which is good news. With that being said the standard and conventional wound care measures will continue. My hope is to add the addition of HBO therapy in order to achieve limb salvage and prevent amputation of the great toe or even further amputation that may be necessitated by this wound not healing appropriately including the complete resolution of the osteomyelitis. We will still be seeing the patient weekly for wound care here in our clinic in order to ensure that things are progressing appropriately. My long-term treatment goal with regard to HBO therapy is to completely close this wound and completely resolve the osteomyelitis and septic joint so that the patient will avoid amputation of the toe. Amputation of 1 digit can start a process of further amputations which in the end can lead to the patient losing more proximal portions of the extremity which is not the pattern that we want to initiate at this point. To this end I will continue to monitor the patient closely to ensure that he is progressing in the appropriate direction through the HBO therapy process. Procedures Wound #2 Pre-procedure diagnosis of Wound #2 is a Diabetic Wound/Ulcer of the Lower Extremity located on the Left,Plantar Toe Great .Severity of Tissue  Pre Debridement is: Fat layer  exposed. There was a Excisional Skin/Subcutaneous Tissue Debridement with a total area of 0.25 sq cm performed by Nelida Meuse., PA-C. With the following instrument(s): Curette to remove Viable and Non-Viable tissue/material. Material removed includes Callus, Subcutaneous Tissue, and Slough. A time out was conducted at 13:14, prior to the start of the procedure. A Minimum amount of bleeding was controlled with Pressure. The procedure was tolerated well. Post Debridement Measurements: 0.2cm length x 0.1cm width x 0.4cm depth; 0.006cm^3 volume. Character of Wound/Ulcer Post Debridement is stable. Severity of Tissue Post Debridement is: Fat layer exposed. Post procedure Diagnosis Wound #2: Same as Pre-Procedure Wound #3 Pre-procedure diagnosis of Wound #3 is a Diabetic Wound/Ulcer of the Lower Extremity located on the Right,Dorsal Toe Great .Severity of Tissue Pre Debridement is: Fat layer exposed. There was a Excisional Skin/Subcutaneous Tissue Debridement with a total area of 0.12 sq cm performed by Nelida Meuse., PA-C. With the following instrument(s): Curette to remove Viable and Non-Viable tissue/material. Material removed includes Callus, Subcutaneous Tissue, and Slough. A time out was conducted at 13:15, prior to the start of the procedure. A Minimum amount of bleeding was controlled with Pressure. The procedure was tolerated well. Post Debridement Measurements: 0.3cm length x 0.4cm width x 0.8cm depth; 0.075cm^3 volume. Character of Wound/Ulcer Post Debridement is stable. Severity of Tissue Post Debridement is: Fat layer exposed. Post procedure Diagnosis Wound #3: Same as Pre-Procedure Plan Follow-up Appointments: Wound #2 Left,Plantar Toe Great: Return Appointment in 1 week. Nurse Visit as needed Bathing/ Shower/ Hygiene: May shower; gently cleanse wound with antibacterial soap, rinse and pat dry prior to dressing wounds No tub bath. Anesthetic (Use 'Patient Medications' Section for  Anesthetic Order Entry): Lidocaine applied to wound bed Edema Control - Lymphedema / Segmental Compressive Device / Other: Patient to wear own Velcro compression garment. Remove compression stockings every night before going to bed and put on every morning when getting up. - patient needs juxtalite or equivalent velcro wrap bilat lower extremities due to chronic venous insufficiency and stage 3 lympthedema Compression Pump: Use compression pump on left lower extremity for 60 minutes, twice daily. Compression Pump: Use compression pump on right lower extremity for 60 minutes, twice daily. DO YOUR BEST to sleep in the bed at night. DO NOT sleep in your recliner. Long hours of sitting in a recliner leads to swelling of the legs and/or potential wounds on your backside. Off-Loading: DOMNIC, VANTOL ([id number]) Offloading felt to foot. - donut cut in offloading felt Other: - front off-loader left Additional Orders / Instructions: Follow Nutritious Diet and Increase Protein Intake Activity as tolerated Hyperbaric Oxygen Therapy: Evaluate for HBO Therapy - Left great toe osteomyelitis Indication and location: - CRO Left great toe If appropriate for treatment, begin HBOT per protocol: 2.0 ATA for 90 Minutes without Air Breaks One treatment per day (delivered Monday through Friday unless otherwise specified in Special Instructions below): Total # of Treatments: - 40 Finger stick Blood Glucose Pre- and Post- HBOT Treatment. Follow Hyperbaric Oxygen Gene Protocol WOUND #2: - Toe Great Wound Laterality: Plantar, Left Primary Dressing: Hydrofera Blue Ready Transfer Foam, 4x5 (in/in) 1 x Per Day/15 Days Discharge Instructions: double layer cut to size of wound Secondary Dressing: Gauze 1 x Per Day/15 Days Discharge Instructions: As directed: dry, moistened with saline or moistened with Dakins Solution Secured With: Medipore Tape - 61M Medipore H Soft Cloth Surgical Tape, 2x2 (in/yd) 1 x Per  Day/15 Days WOUND #3: - Toe  Great Wound Laterality: Dorsal, Left Primary Dressing: Hydrofera Blue Ready Transfer Foam, 4x5 (in/in) 1 x Per Day/15 Days Discharge Instructions: double layer cut to size of wound Secondary Dressing: Gauze 1 x Per LKG/40 Days Discharge Instructions: As directed: dry, moistened with saline or moistened with Dakins Solution Secured With: Medipore Tape - 98M Medipore H Soft Cloth Surgical Tape, 2x2 (in/yd) 1 x Per Day/15 Days 1. I am going to suggest that we go ahead and continue with the recommendation for wound care measures as before and the patient is in agreement with the plan. This includes the use of the Hydrofera Blue rope in both locations on the top of the toe as well as the plantar aspect of the toe. 2. I am also going to recommend that we have the patient continue to take the Augmentin which is still I think the best way to go at this point. I have explained that he is good to be on this for some time due to the osteomyelitis. 3. I am also going to recommend that he utilize the shoe for offloading which she does seem to be doing he is also try to stay off of this is much as possible. 4. 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 We will see patient back for reevaluation in 1 week here in the clinic. If anything worsens or changes patient will contact our office for additional recommendations. Electronic Signature(s) Signed: 04/26/2022 5:18:20 PM By: Worthy Keeler PA-C Previous Signature: 04/25/2022 4:48:21 PM Version By: Worthy Keeler PA-C Previous Signature: 04/25/2022 4:24:24 PM Version By: Worthy Keeler PA-C Previous Signature:  04/25/2022 4:20:44 PM Version By: Worthy Keeler PA-C Entered By: Worthy Keeler on 04/26/2022 17:18:20 [name] ([id number]) -------------------------------------------------------------------------------- SuperBill Details Patient Name: [name] Date of Service: 04/25/2022 Medical Record Number: [id number] Patient Account Number: [id number] Date of Birth/Sex: 1951-02-22 (71 y.o. 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 I10 Essential (primary) hypertension Facility Procedures CPT4 Code: 56433295 Description: 11042 - DEB SUBQ TISSUE 20 SQ CM/< Modifier: Quantity: 1 CPT4 Code: Description: ICD-10 Diagnosis Description 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 Modifier: osed Quantity: Physician Procedures CPT4 Code: 1884166 Description: 99214 - WC PHYS LEVEL 4 - EST PT Modifier: 25 Quantity: 1 CPT4 Code: Description: ICD-10 Diagnosis Description M86.672 Other chronic osteomyelitis, left ankle and foot E11.621 Type 2 diabetes mellitus with foot ulcer 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 E11.42 Type 2 diabetes  mellitus with diabetic polyneuropathy Modifier: osed Quantity: CPT4 Code: 0630160 Description: 11042 - WC PHYS SUBQ TISS 20 SQ CM Modifier: Quantity: 1 CPT4 Code: Description: ICD-10 Diagnosis Description L97.522 Non-pressure chronic ulcer of other part of left foot with fat  layer exp Modifier:  osed Quantity: Electronic Signature(s) Signed: 04/25/2022 4:24:50 PM By: Lenda Kelp PA-C Previous Signature: 04/25/2022 4:20:59 PM Version By: Lenda Kelp PA-C Entered By: Lenda Kelp on 04/25/2022 16:24:50

## 2022-04-29 NOTE — Progress Notes (Signed)
HERU, MONTZ ([id number]) Visit Report for 04/25/2022 Arrival Information Details Patient Name: MANLY, NESTLE Date of Service: 04/25/2022 12:45 PM Medical Record Number: [id number] Patient Account Number: [id number] Date of Birth/Sex: Oct 27, 1950 (71 y.o. M) Treating RN: Carlene Coria Primary Care Perel Hauschild: Tracie Harrier Other Clinician: Massie Kluver Referring Nanetta Wiegman: Tracie Harrier Treating Deshanta Lady/Extender: Skipper Cliche in Treatment: 15 Visit Information History Since Last Visit All ordered tests and consults were completed: No Patient Arrived: Kasandra Knudsen Added or deleted any medications: No Arrival Time: 12:48 Any new allergies or adverse reactions: No Transfer Assistance: None Had a fall or experienced change in No Patient Requires Transmission-Based No activities of daily living that may affect Precautions: risk of falls: Patient Has Alerts: Yes Hospitalized since last visit: No Patient Alerts: Patient on Blood Pain Present Now: No Thinner 01/07/2022 [place]>220 on left Electronic Signature(s) Signed: 04/28/2022 11:41:41 AM By: Massie Kluver Entered By: Massie Kluver on 04/25/2022 12:51:40 [name] ([id number]) -------------------------------------------------------------------------------- Clinic Level of Care Assessment Details Patient Name: [name] Date of Service: 04/25/2022 12:45 PM Medical Record Number: [id number] Patient Account Number: [id number] Date of Birth/Sex: 02-26-1951 (71 y.o. M) Treating RN: Carlene Coria Primary Care Etana Beets: Tracie Harrier Other Clinician: Massie Kluver Referring Daleon Willinger: Tracie Harrier Treating Zakariyya Helfman/Extender: Skipper Cliche in Treatment: 15 Clinic Level of Care Assessment Items TOOL 1 Quantity Score []  - Use when EandM and Procedure is performed on INITIAL visit 0 ASSESSMENTS - Nursing Assessment / Reassessment []  - General Physical Exam (combine w/ comprehensive assessment (listed just  below) when performed on new 0 pt. evals) []  - 0 Comprehensive Assessment (HX, ROS, Risk Assessments, Wounds Hx, etc.) ASSESSMENTS - Wound and Skin Assessment / Reassessment []  - Dermatologic / Skin Assessment (not related to wound area) 0 ASSESSMENTS - Ostomy and/or Continence Assessment and Care []  - Incontinence Assessment and Management 0 []  - 0 Ostomy Care Assessment and Management (repouching, etc.) PROCESS - Coordination of Care []  - Simple Patient / Family Education for ongoing care 0 []  - 0 Complex (extensive) Patient / Family Education for ongoing care []  - 0 Staff obtains Programmer, systems, Records, Test Results / Process Orders []  - 0 Staff telephones HHA, Nursing Homes / Clarify orders / etc []  - 0 Routine Transfer to another Facility (non-emergent condition) []  - 0 Routine Hospital Admission (non-emergent condition) []  - 0 New Admissions / Biomedical engineer / Ordering NPWT, Apligraf, etc. []  - 0 Emergency Hospital Admission (emergent condition) PROCESS - Special Needs []  - Pediatric / Minor Patient Management 0 []  - 0 Isolation Patient Management []  - 0 Hearing / Language / Visual special needs []  - 0 Assessment of Community assistance (transportation, D/C planning, etc.) []  - 0 Additional assistance / Altered mentation []  - 0 Support Surface(s) Assessment (bed, cushion, seat, etc.) INTERVENTIONS - Miscellaneous []  - External ear exam 0 []  - 0 Patient Transfer (multiple staff / Civil Service fast streamer / Similar devices) []  - 0 Simple Staple / Suture removal (25 or less) []  - 0 Complex Staple / Suture removal (26 or more) []  - 0 Hypo/Hyperglycemic Management (do not check if billed separately) []  - 0 Ankle / Brachial Index (ABI) - do not check if billed separately Has the patient been seen at the hospital within the last three years: Yes Total Score: 0 Level Of Care: ____ [name] ([id number]) Electronic Signature(s) Signed: 04/28/2022 11:41:41 AM By:  Massie Kluver Entered By: Massie Kluver on 04/25/2022 13:32:49 [name] ([id number]) -------------------------------------------------------------------------------- Encounter Discharge Information Details Patient Name:  [name] Date of Service: 04/25/2022 12:45 PM Medical Record Number: [id number] Patient Account Number: [id number] Date of Birth/Sex: 10/28/1950 (71 y.o. M) Treating RN: Yevonne Pax Primary Care Kadelyn Dimascio: Barbette Reichmann Other Clinician: Betha Loa Referring Refugia Laneve: Barbette Reichmann Treating Vergil Burby/Extender: Rowan Blase in Treatment: 15 Encounter Discharge Information Items Post Procedure Vitals Discharge Condition: Stable Temperature (F): 97.8 Ambulatory Status: Cane Pulse (bpm): 68 Discharge Destination: Home Respiratory Rate (breaths/min): 18 Transportation: Private Auto Blood Pressure (mmHg): 144/75 Accompanied By: self Schedule Follow-up Appointment: No Clinical Summary of Care: Electronic Signature(s) Signed: 04/28/2022 11:41:41 AM By: Betha Loa Entered By: Betha Loa on 04/25/2022 15:41:55 [name] ([id number]) -------------------------------------------------------------------------------- Lower Extremity Assessment Details Patient Name: [name] Date of Service: 04/25/2022 12:45 PM Medical Record Number: [id number] Patient Account Number: [id number] Date of Birth/Sex: Apr 02, 1951 (71 y.o. M) Treating RN: Yevonne Pax Primary Care Irlene Crudup: Barbette Reichmann Other Clinician: Betha Loa Referring Hersel Mcmeen: Barbette Reichmann Treating Tarrence Enck/Extender: Rowan Blase in Treatment: 15 Edema Assessment Assessed: [Left: Yes] [Right: No] Edema: [Left: Ye] [Right: s] Calf Left: Right: Point of Measurement: 28 cm From Medial Instep 47.6 cm Ankle Left: Right: Point of Measurement: 11 cm From Medial Instep 32 cm Vascular Assessment Pulses: Dorsalis Pedis Palpable: [Left:Yes] Posterior  Tibial Palpable: [Left:Yes] Electronic Signature(s) Signed: 04/25/2022 2:31:54 PM By: Yevonne Pax RN Signed: 04/28/2022 11:41:41 AM By: Betha Loa Entered By: Betha Loa on 04/25/2022 13:07:17 [name] ([id number]) -------------------------------------------------------------------------------- Multi Wound Chart Details Patient Name: [name] Date of Service: 04/25/2022 12:45 PM Medical Record Number: [id number] Patient Account Number: [id number] Date of Birth/Sex: [birth year] (71 y.o. M) Treating RN: Yevonne Pax Primary Care Yasaman Kolek: Barbette Reichmann Other Clinician: Betha Loa Referring Pallas Wahlert: Barbette Reichmann Treating Sylver Vantassell/Extender: Rowan Blase in Treatment: 15 Vital Signs Height(in): 68 Pulse(bpm): 68 Weight(lbs): 217 Blood Pressure(mmHg): 144/75 Body Mass Index(BMI): 33 Temperature(F): 97.8 Respiratory Rate(breaths/min): 18 Photos: [N/A:N/A] Wound Location: Left, Plantar Toe Great Right, Dorsal Toe Great N/A Wounding Event: Gradually Appeared Pressure Injury N/A Primary Etiology: Diabetic Wound/Ulcer of the Lower Diabetic Wound/Ulcer of the Lower N/A Extremity Extremity Comorbid History: Cataracts, Sickle Cell Disease, Sleep Cataracts, Sickle Cell Disease, Sleep N/A Apnea, Hypertension, Type II Apnea, Hypertension, Type II Diabetes, Gout, Osteoarthritis Diabetes, Gout, Osteoarthritis Date Acquired: 12/09/2021 03/25/2022 N/A Weeks of Treatment: 15 0 N/A Wound Status: Open Open N/A Wound Recurrence: No No N/A Measurements L x W x D (cm) 0.2x0.1x0.4 0.3x0.4x0.8 N/A Area (cm) : 0.016 0.094 N/A Volume (cm) : 0.006 0.075 N/A % Reduction in Area: 94.30% N/A N/A % Reduction in Volume: 95.70% N/A N/A Classification: Grade 3 Grade 2 N/A Exudate Amount: Medium Medium N/A Exudate Type: Serosanguineous Serosanguineous N/A Exudate Color: red, brown red, brown N/A Wound Margin: Flat and Intact N/A N/A Granulation Amount: Large (67-100%)  None Present (0%) N/A Granulation Quality: Pink, Pale N/A N/A Necrotic Amount: Small (1-33%) Small (1-33%) N/A Exposed Structures: Fat Layer (Subcutaneous Tissue): N/A N/A Yes Fascia: No Tendon: No Muscle: No Joint: No Bone: No Epithelialization: None None N/A Treatment Notes Electronic Signature(s) Signed: 04/28/2022 11:41:41 AM By: Betha Loa Entered By: Betha Loa on 04/25/2022 13:07:40 [name] ([name] ([id number]) -------------------------------------------------------------------------------- Multi-Disciplinary Care Plan Details Patient Name: [name] Date of Service: 04/25/2022 12:45 PM Medical Record Number: [id number] Patient Account Number: [id number] Date of Birth/Sex: 1951/01/22 (71 y.o. M) Treating RN: Yevonne Pax Primary Care Ninetta Adelstein: Barbette Reichmann Other Clinician: Betha Loa Referring Garielle Mroz: Barbette Reichmann Treating Danaysha Kirn/Extender: Rowan Blase in Treatment: 15 Active  Inactive Electronic Signature(s) Signed: 04/25/2022 2:31:54 PM By: Yevonne PaxEpps, Carrie RN Signed: 04/28/2022 11:41:41 AM By: Betha LoaVenable, Angie Entered By: Betha LoaVenable, Angie on 04/25/2022 13:07:23 [name], Ganesh V. ([id number]) -------------------------------------------------------------------------------- Pain Assessment Details Patient Name: [name], Ashtan V. Date of Service: 04/25/2022 12:45 PM Medical Record Number: [id number] Patient Account Number: [id number] Date of Birth/Sex: 09/19/1950 (71 y.o. M) Treating RN: Yevonne PaxEpps, Carrie Primary Care Rakwon Letourneau: Barbette ReichmannHande, Vishwanath Other Clinician: Betha LoaVenable, Angie Referring Tige Meas: Barbette ReichmannHande, Vishwanath Treating Angla Delahunt/Extender: Rowan BlaseStone, Hoyt Weeks in Treatment: 15 Active Problems Location of Pain Severity and Description of Pain Patient Has Paino No Site Locations Pain Management and Medication Current Pain Management: Electronic Signature(s) Signed: 04/25/2022 2:31:54 PM By: Yevonne PaxEpps, Carrie RN Signed:  04/28/2022 11:41:41 AM By: Betha LoaVenable, Angie Entered By: Betha LoaVenable, Angie on 04/25/2022 12:54:10 [name], Turki V. ([id number]) -------------------------------------------------------------------------------- Patient/Caregiver Education Details Patient Name: [name], Kawika V. Date of Service: 04/25/2022 12:45 PM Medical Record Number: [id number] Patient Account Number: [id number] Date of Birth/Gender: 03/29/1951 (71 y.o. M) Treating RN: Yevonne PaxEpps, Carrie Primary Care Physician: Barbette ReichmannHande, Vishwanath Other Clinician: Betha LoaVenable, Angie Referring Physician: Barbette ReichmannHande, Vishwanath Treating Physician/Extender: Rowan BlaseStone, Hoyt Weeks in Treatment: 15 Education Assessment Education Provided To: Patient Education Topics Provided Hyperbaric Oxygenation: Handouts: Hyperbaric Oxygen Methods: Demonstration Responses: State content correctly Wound/Skin Impairment: Handouts: Other: continue wound care as directed Electronic Signature(s) Signed: 04/28/2022 11:41:41 AM By: Betha LoaVenable, Angie Entered By: Betha LoaVenable, Angie on 04/25/2022 13:33:22 [name], Ravindra V. ([id number]) -------------------------------------------------------------------------------- Wound Assessment Details Patient Name: [name], Duan V. Date of Service: 04/25/2022 12:45 PM Medical Record Number: [id number] Patient Account Number: [id number] Date of Birth/Sex: 04/22/1951 (71 y.o. M) Treating RN: Yevonne PaxEpps, Carrie Primary Care Rhoderick Farrel: Barbette ReichmannHande, Vishwanath Other Clinician: Betha LoaVenable, Angie Referring Oney Tatlock: Barbette ReichmannHande, Vishwanath Treating Caralynn Gelber/Extender: Rowan BlaseStone, Hoyt Weeks in Treatment: 15 Wound Status Wound Number: 2 Primary Diabetic Wound/Ulcer of the Lower Extremity Etiology: Wound Location: Left, Plantar Toe Great Wound Open Wounding Event: Gradually Appeared Status: Date Acquired: 12/09/2021 Comorbid Cataracts, Sickle Cell Disease, Sleep Apnea, Weeks Of Treatment: 15 History: Hypertension, Type II Diabetes, Gout, Osteoarthritis Clustered Wound:  No Photos Wound Measurements Length: (cm) 0.2 % Red Width: (cm) 0.1 % Red Depth: (cm) 0.4 Epith Area: (cm) 0.016 Tunn Volume: (cm) 0.006 Unde uction in Area: 94.3% uction in Volume: 95.7% elialization: None eling: No rmining: No Wound Description Classification: Grade 3 Foul Wound Margin: Flat and Intact Slou Exudate Amount: Medium Exudate Type: Serosanguineous Exudate Color: red, brown Odor After Cleansing: No gh/Fibrino Yes Wound Bed Granulation Amount: Large (67-100%) Exposed Structure Granulation Quality: Pink, Pale Fascia Exposed: No Necrotic Amount: Small (1-33%) Fat Layer (Subcutaneous Tissue) Exposed: Yes Necrotic Quality: Adherent Slough Tendon Exposed: No Muscle Exposed: No Joint Exposed: No Bone Exposed: No Treatment Notes Wound #2 (Toe Great) Wound Laterality: Plantar, Left Cleanser Peri-Wound Care Topical [name], Zaeem V. ([id number]) Primary Dressing Hydrofera Blue Ready Transfer Foam, 4x5 (in/in) Discharge Instruction: double layer cut to size of wound Secondary Dressing Gauze Discharge Instruction: As directed: dry, moistened with saline or moistened with Dakins Solution Secured With Medipore Tape - 7M Medipore H Soft Cloth Surgical Tape, 2x2 (in/yd) Compression Wrap Compression Stockings Add-Ons Electronic Signature(s) Signed: 04/25/2022 2:31:54 PM By: Yevonne PaxEpps, Carrie RN Signed: 04/28/2022 11:41:41 AM By: Betha LoaVenable, Angie Entered By: Betha LoaVenable, Angie on 04/25/2022 13:03:47 [name], Calistro V. ([id number]) -------------------------------------------------------------------------------- Wound Assessment Details Patient Name: [name], Jerric V. Date of Service: 04/25/2022 12:45 PM Medical Record Number: [id number] Patient Account Number: [id number] Date of Birth/Sex: 09/14/1950 (71 y.o. M) Treating RN: Yevonne PaxEpps, Carrie Primary Care Tremell Reimers: Barbette ReichmannHande, Vishwanath Other Clinician: Betha LoaVenable, Angie Referring Mozetta Murfin: Barbette ReichmannHande, Vishwanath  Treating Attila Mccarthy/Extender:  Allen Derry Weeks in Treatment: 15 Wound Status Wound Number: 3 Primary Diabetic Wound/Ulcer of the Lower Extremity Etiology: Wound Location: Left, Dorsal Toe Great Wound Open Wounding Event: Pressure Injury Status: Date Acquired: 03/25/2022 Comorbid Cataracts, Sickle Cell Disease, Sleep Apnea, Weeks Of Treatment: 0 History: Hypertension, Type II Diabetes, Gout, Osteoarthritis Clustered Wound: No Photos Wound Measurements Length: (cm) 0.3 Width: (cm) 0.4 Depth: (cm) 0.8 Area: (cm) 0.094 Volume: (cm) 0.075 % Reduction in Area: 0% % Reduction in Volume: 0% Epithelialization: None Tunneling: No Undermining: No Wound Description Classification: Grade 3 Wound Margin: Distinct, outline attached Exudate Amount: Medium Exudate Type: Serosanguineous Exudate Color: red, brown Foul Odor After Cleansing: No Slough/Fibrino No Wound Bed Granulation Amount: Small (1-33%) Exposed Structure Granulation Quality: Red Fat Layer (Subcutaneous Tissue) Exposed: Yes Necrotic Amount: Large (67-100%) Necrotic Quality: Adherent Slough Treatment Notes Wound #3 (Toe Great) Wound Laterality: Dorsal, Right Cleanser Peri-Wound Care Topical Primary Dressing Hydrofera Blue Ready Transfer Foam, 4x5 (in/in) BRENNYN, HAISLEY ([id number]) Discharge Instruction: double layer cut to size of wound Secondary Dressing Gauze Discharge Instruction: As directed: dry, moistened with saline or moistened with Dakins Solution Secured With Medipore Tape - 63M Medipore H Soft Cloth Surgical Tape, 2x2 (in/yd) Compression Wrap Compression Stockings Add-Ons Electronic Signature(s) Signed: 04/25/2022 4:22:42 PM By: Lenda Kelp PA-C Signed: 04/29/2022 9:44:45 AM By: Yevonne Pax RN Previous Signature: 04/25/2022 4:16:46 PM Version By: Lenda Kelp PA-C Previous Signature: 04/25/2022 2:31:54 PM Version By: Yevonne Pax RN Entered By: Lenda Kelp on 04/25/2022 16:22:42 [name]  ([id number]) -------------------------------------------------------------------------------- Vitals Details Patient Name: [name] Date of Service: 04/25/2022 12:45 PM Medical Record Number: [id number] Patient Account Number: [id number] Date of Birth/Sex: 07/27/1951 (71 y.o. M) Treating RN: Yevonne Pax Primary Care Leeyah Heather: Barbette Reichmann Other Clinician: Betha Loa Referring Angelize Ryce: Barbette Reichmann Treating Kenny Rea/Extender: Rowan Blase in Treatment: 15 Vital Signs Time Taken: 12:52 Temperature (F): 97.8 Height (in): 68 Pulse (bpm): 68 Weight (lbs): 217 Respiratory Rate (breaths/min): 18 Body Mass Index (BMI): 33 Blood Pressure (mmHg): 144/75 Reference Range: 80 - 120 mg / dl Electronic Signature(s) Signed: 04/28/2022 11:41:41 AM By: Betha Loa Entered By: Betha Loa on 04/25/2022 12:54:07

## 2022-05-02 ENCOUNTER — Encounter: Payer: Medicare Other | Admitting: Physician Assistant

## 2022-05-02 DIAGNOSIS — E11621 Type 2 diabetes mellitus with foot ulcer: Secondary | ICD-10-CM | POA: Diagnosis not present

## 2022-05-02 LAB — GLUCOSE, CAPILLARY
Glucose-Capillary: 206 mg/dL — ABNORMAL HIGH (ref 70–99)
Glucose-Capillary: 109 mg/dL — ABNORMAL HIGH (ref 70–99)

## 2022-05-02 NOTE — Progress Notes (Addendum)
SOHUM, DELILLO ([id number]) Visit Report for 05/02/2022 Chief Complaint Document Details Patient Name: Gene Brown, Gene Brown Date of Service: 05/02/2022 12:30 PM Medical Record Number: [id number] Patient Account Number: [id number] Date of Birth/Sex: Aug 27, 1950 (71 y.o. M) Treating RN: Yevonne Pax Primary Care Provider: Barbette Reichmann Other Clinician: Referring Provider: Barbette Reichmann Treating Provider/Extender: Rowan Blase in Treatment: 16 Information Obtained from: Patient Chief Complaint Left great toe ulcer Electronic Signature(s) Signed: 05/02/2022 12:18:05 PM By: Lenda Kelp PA-C Entered By: Lenda Kelp on 05/02/2022 12:18:05 Gene Brown ([id number]) -------------------------------------------------------------------------------- Debridement Details Patient Name: Gene Brown Date of Service: 05/02/2022 12:30 PM Medical Record Number: [id number] Patient Account Number: [id number] Date of Birth/Sex: 05/22/1951 (71 y.o. M) Treating RN: Yevonne Pax Primary Care Provider: Barbette Reichmann Other Clinician: Referring Provider: Barbette Reichmann Treating Provider/Extender: Rowan Blase in Treatment: 16 Debridement Performed for Wound #2 Left,Plantar Toe Great Assessment: Performed By: Physician Nelida Meuse., PA-C Debridement Type: Debridement Severity of Tissue Pre Debridement: Fat layer exposed Level of Consciousness (Pre- Awake and Alert procedure): Pre-procedure Verification/Time Out Yes - 13:20 Taken: Start Time: 13:20 Total Area Debrided (L x W): 1 (cm) x 1 (cm) = 1 (cm) Tissue and other material Viable, Non-Viable, Callus, Slough, Subcutaneous, Skin: Dermis , Skin: Epidermis, Slough debrided: Level: Skin/Subcutaneous Tissue Debridement Description: Excisional Instrument: Curette Bleeding: Minimum Hemostasis Achieved: Pressure End Time: 13:30 Procedural Pain: 0 Post Procedural Pain: 0 Response to Treatment: Procedure was  tolerated well Level of Consciousness (Post- Awake and Alert procedure): Post Debridement Measurements of Total Wound Length: (cm) 0.4 Width: (cm) 0.6 Depth: (cm) 0.3 Volume: (cm) 0.057 Character of Wound/Ulcer Post Debridement: Improved Severity of Tissue Post Debridement: Fat layer exposed Post Procedure Diagnosis Same as Pre-procedure Electronic Signature(s) Signed: 05/02/2022 4:20:15 PM By: Yevonne Pax RN Signed: 05/02/2022 4:44:22 PM By: Lenda Kelp PA-C Entered By: Yevonne Pax on 05/02/2022 13:29:43 Gene Brown ([id number]) -------------------------------------------------------------------------------- Debridement Details Patient Name: Gene Brown Date of Service: 05/02/2022 12:30 PM Medical Record Number: [id number] Patient Account Number: [id number] Date of Birth/Sex: 05-26-1951 (71 y.o. M) Treating RN: Yevonne Pax Primary Care Provider: Barbette Reichmann Other Clinician: Referring Provider: Barbette Reichmann Treating Provider/Extender: Rowan Blase in Treatment: 16 Debridement Performed for Wound #3 Left,Dorsal Toe Great Assessment: Performed By: Physician Nelida Meuse., PA-C Debridement Type: Debridement Severity of Tissue Pre Debridement: Fat layer exposed Level of Consciousness (Pre- Awake and Alert procedure): Pre-procedure Verification/Time Out Yes - 13:20 Taken: Start Time: 13:20 Total Area Debrided (L x W): 0.5 (cm) x 0.5 (cm) = 0.25 (cm) Tissue and other material Viable, Non-Viable, Callus, Slough, Subcutaneous, Skin: Dermis , Skin: Epidermis, Slough debrided: Level: Skin/Subcutaneous Tissue Debridement Description: Excisional Instrument: Curette Bleeding: Minimum Hemostasis Achieved: Pressure End Time: 13:30 Procedural Pain: 0 Post Procedural Pain: 0 Response to Treatment: Procedure was tolerated well Level of Consciousness (Post- Awake and Alert procedure): Post Debridement Measurements of Total Wound Length: (cm)  0.3 Width: (cm) 0.4 Depth: (cm) 0.4 Volume: (cm) 0.038 Character of Wound/Ulcer Post Debridement: Improved Severity of Tissue Post Debridement: Fat layer exposed Post Procedure Diagnosis Same as Pre-procedure Electronic Signature(s) Signed: 05/02/2022 4:20:15 PM By: Yevonne Pax RN Signed: 05/02/2022 4:44:22 PM By: Lenda Kelp PA-C Entered By: Yevonne Pax on 05/02/2022 13:30:11 Gene Brown ([id number]) -------------------------------------------------------------------------------- HPI Details Patient Name: Gene Brown Date of Service: 05/02/2022 12:30 PM Medical Record Number: [id number] Patient Account Number: [id number] Date of Birth/Sex: Aug 27, 1950 (71 y.o. M) Treating RN: Yevonne Pax Primary Care Provider: Barbette Reichmann  Other Clinician: Referring Provider: Barbette Reichmann Treating Provider/Extender: Rowan Blase in Treatment: 16 History of Present Illness HPI Description: 05/24/16; this is a 71 year old type II diabetic who probably has some degree of polyneuropathy. He tells me that he has had an area on the plantar aspect of his left great toe that is been followed by podiatry for most the last year. He has a lot of callus, the podiatrist which shave this down put a dressing on and he would follow pad monthly or sometimes longer intervals. He has not noted any drainage or pain. He has not had a recent x-ray. No serious attempt at offloading this with modified footwear.. Last hemoglobin A1c that he knows about was over 10 a month ago he is working hard on this with his primary physician. More recently he has been soaking this with peroxide ABIs are noncompressible and the left leg. He was told by his podiatrist he has a bone spur underneath the wound but did not wish any surgical procedures. He has a caregiver at home for a very disabled wife, the nature of her illness is not certain 05/31/16 patient comes in much the same as last week amounts of callus  nonviable subcutaneous tissue which required debridement. His x-ray suggested cortical bone irregularity. He will need an MRI. He tells me he has old screws/hardware left knee however he thinks he has had a subsequent MRI after this 06/07/16 at this point in time patient has been attempting to offload his wound as much as he could on his own. He has made a conscious effort to avoid placing any pressure on the toe as he was moving around and walking and I do believe this has payed off and what I am seeing at this point in time. overall the wound appears to be somewhat improved in my opinion he is not having any significant discomfort at this point in time. In fact he tells me he has no pain at all. There is no interval sign of surrounding infection in the wound itself has actually decreased in size. Unfortunately the MRI was not ordered last week and therefore we do need to proceed with the MRI order today. again this is to evaluate the cortical bone irregularity noted on x-ray. 06/14/16 Patient has continued to use the offloading shoe at this point in time. He tells me this is somewhat difficult to get used to walking and but nonetheless he is very strict about wearing it. Again I believe this has paid off very well for him and his wound appears to be greatly improved. He does have some callused area surrounding the wound proximally that has caused some fluid collection of maceration of the callused area. This likely will require debridement today. Some adherent slough noted over the wound bed. 06/21/16; patient's wound on the plantar aspect of his left great toe is totally epithelialized. His MRI of the foot is tomorrow to follow-up on the cortical irregularity noted on plain x-ray. He does not have diabetic foot where 06-28-16-He comes in today with a continued healed left great toe. MRI performed on 06/23/2016 shows no abscess osteomyelitis or septic joint. Patient was discharged from wound care  center today. Readmission: 01-07-2022 upon evaluation today patient appears to be doing somewhat poorly in regard to total ulcer on his left foot. Fortunately there does not appear to be any signs of infection which is great news. Has been seen by Dr. Excell Seltzer and he did request from Dr. Excell Seltzer to come  see [place] due to the fact that in the past several years back we were able to get one of his wounds healed about 4 weeks. Again I explained that that is always variable but nonetheless I definitely think we can help. Fortunately I see no evidence of infection. He does have a foot offloading shoe which I think is good to attempt for now he was able to get this healed before with a peg assist. Patient does have a history of type 2 diabetes, diabetic polyneuropathy, and hypertension. This current wound started on 12-09-2021. 01-20-2022 upon evaluation today patient appears to be doing poorly in regard to his great toe. I am actually very concerned based on what I am seeing here at this point about how things are progressing. I do not believe there is any evidence of infection systemically that is obvious but locally I am concerned about the possibility this could even be an osteomyelitis type situation. 01-28-2022 upon evaluation today patient appears to be doing well with regard to his toe ulcer compared to what we were previous. Fortunately I do not see any signs of active infection at this time which is great news. No fevers, chills, nausea, vomiting, or diarrhea. With that being said the patient has not started his Augmentin yet which I definitely think he needs to get on board ASAP. I do see some signs of new skin growth I think he is using the front offloading shoe which is good for the time being. 02-04-2022 upon evaluation today patient appears to be doing okay in regard to his toe ulcer this is actually measuring a little bit smaller but still has a bit of callus buildup but not as much as we have seen in the  past. Fortunately I do not see any evidence of active infection locally or systemically which is great news. No fevers, chills, nausea, vomiting, or diarrhea. Marland Kitchen 02-14-2022 upon evaluation today patient's wound is actually showing signs of good improvement which is great news. Fortunately I do not see any evidence of active infection locally or systemically which is great news as well. No fevers, chills, nausea, vomiting, or diarrhea. 02-21-2022 upon evaluation today patient's toe ulcer actually is showing signs of excellent improvement. I do not see any evidence of infection locally or systemically which is great news and overall I am extremely pleased with where we stand today. I do not see any signs of active infection locally or systemically which is great news. No fevers, chills, nausea, vomiting, or diarrhea. 7/24; plantar left great toe no real improvement since last week. Using silver alginate gauze. He has a forefoot offloading boot. He also has lymphedema and uses his compression pumps I do not think this is adding any particular difficulties. It sounds as though he is still pretty active 03-07-2022 upon evaluation today patient appears to be doing excellent in regard to the wound compared to previous pictures. He still has a quite deep wound on the toe good news is the x-ray did not show any signs of osteomyelitis which is excellent. He does need some debridement today. 03-14-2022 upon evaluation today patient appears to be doing well currently in regard to his wound. He has actually been tolerating the dressing changes without complication. With that being said the dressing seems to either be draining a lot or getting very wet otherwise I think we need to have him change this more frequently. Gene Brown, Gene Brown ([id number]) 03-21-2022 upon evaluation today patient appears to be doing well with  regard to his toe ulcer I am actually seeing signs of improvement which is great news. This is slow but  nonetheless the skin around the edges of the wound are definitely improving as we proceed here. I do not see any evidence of active infection locally or systemically which is great news. 03-29-2022 upon evaluation today patient appears to be doing fairly well except for with regard to his toe being somewhat red. I do think that we may want to place him back on the Augmentin which was previously beneficial for him. He is not in disagreement with this. Otherwise I did perform some debridement of clearway some of the necrotic debris today he seems to be tolerating this without complication. 04-04-2022 upon evaluation today patient unfortunately is continued to have significant issues here with his toe ulcer. He actually has an abscess more proximal on the dorsal aspect of the toe which is new since last week. Subsequently he is on antibiotics already I think we need to proceed with an MRI to further evaluate this. 04-12-2022 upon evaluation today patient appears to be doing better in regard to his toe ulcer. This actually is less red than what it was he is about done with the initial antibiotic he does has his MRI scheduled for Friday. The reason I am going to probably extend the Augmentin for the time being I will to make sure that we keep this under control especially Brown he potentially has a bone infection I do not going to be going without the antibiotic therapy. Patient is in agreement with that plan. 04-18-2022 upon evaluation today patient does have an MRI for review that was obtained on Sunday. 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 He has been on antibiotics I had him on Augmentin starting initially on January 25, 2022. Continuing to be a problem here. I do not see any signs of infection spreading up his foot but the toe itself still has purulent drainage  coming from it. Coupled with this I have had him on the antibiotics, Augmentin, and he is tolerating that quite well. He is actually been on this since 01-20-2022. During that time his wound is getting somewhat better. But is still been very slow which is what prompted the MRI due to the fact that I felt like at a later point he started develop an abscess on the top of his toe that then had me more concerned. At this point I do believe that he is probably can require hyperbaric oxygen therapy I think that this is the ideal treatment plan for limb salvage coupled with the antibiotic therapy and good wound care. 04-25-2022 upon evaluation today patient appears to be doing okay in regard to his foot ulcers. He actually has an opening on the dorsal aspect of the toe which does not directly connect to the plantar aspect. This is still open in both locations he did have some callus covering that did require sharp debridement today to clear this away. Fortunately there does not appear to be any evidence of active infection at this point. 05-02-2022 upon evaluation today patient appears to be doing well in regard to his wounds. He has been tolerating the dressing changes without complication. With that being said he does have some evidence here of continued drainage. There is evidence of infection as well  although I do believe that the medicine is helping to keep this under good control. His erythema is improved although the swelling of the great toe is still present. He has started HBO as of this morning. Electronic Signature(s) Signed: 05/02/2022 2:23:14 PM By: Lenda Kelp PA-C Entered By: Lenda Kelp on 05/02/2022 14:23:14 Gene Brown ([id number]) -------------------------------------------------------------------------------- Physical Exam Details Patient Name: Gene Brown Date of Service: 05/02/2022 12:30 PM Medical Record Number: [id number] Patient Account Number: [id number] Date of  Birth/Sex: 10/29/1950 (71 y.o. M) Treating RN: Yevonne Pax Primary Care Provider: Barbette Reichmann Other Clinician: Referring Provider: Barbette Reichmann Treating Provider/Extender: Rowan Blase in Treatment: 16 Constitutional Well-nourished and well-hydrated in no acute distress. Respiratory normal breathing without difficulty. Psychiatric this patient is able to make decisions and demonstrates good insight into disease process. Alert and Oriented x 3. pleasant and cooperative. Notes Patient's wounds did require sharp debridement. Initially they appear to be much smaller but there was callus covering inspection of the plantar aspect once this was removed they still have depth to them and this is still something that does extend quite deep though the 2 wounds do not connect. Electronic Signature(s) Signed: 05/02/2022 2:23:31 PM By: Lenda Kelp PA-C Entered By: Lenda Kelp on 05/02/2022 14:23:31 Gene Brown ([id number]) -------------------------------------------------------------------------------- Physician Orders Details Patient Name: Gene Brown Date of Service: 05/02/2022 12:30 PM Medical Record Number: [id number] Patient Account Number: [id number] Date of Birth/Sex: [birth year] (71 y.o. M) Treating RN: Yevonne Pax Primary Care Provider: Barbette Reichmann Other Clinician: Referring Provider: Barbette Reichmann Treating Provider/Extender: Rowan Blase in Treatment: 16 Verbal / Phone Orders: No Diagnosis Coding ICD-10 Coding Code Description [phone] Other chronic osteomyelitis, left ankle and foot E11.621 Type 2 diabetes mellitus with foot ulcer 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osed E11.42 Type 2 diabetes mellitus with diabetic polyneuropathy I10 Essential (primary) hypertension Follow-up Appointments Wound #2 Left,Plantar Toe Great o Return Appointment in 1 week. o Nurse Visit as needed Bathing/  Shower/ Hygiene o May shower; gently cleanse wound with antibacterial soap, rinse and pat dry prior to dressing wounds o No tub bath. Anesthetic (Use 'Patient Medications' Section for Anesthetic Order Entry) o Lidocaine applied to wound bed Edema Control - Lymphedema / Segmental Compressive Device / Other o Patient to wear own Velcro compression garment. Remove compression stockings every night before going to bed and put on every morning when getting up. o Compression Pump: Use compression pump on left lower extremity for 60 minutes, twice daily. o Compression Pump: Use compression pump on right lower extremity for 60 minutes, twice daily. o DO YOUR BEST to sleep in the bed at night. DO NOT sleep in your recliner. Long hours of sitting in a recliner leads to swelling of the legs and/or potential wounds on your backside. Off-Loading o Offloading felt to foot. - donut cut in offloading felt o Other: - front off-loader left Additional Orders / Instructions o Follow Nutritious Diet and Increase Protein Intake Hyperbaric Oxygen Therapy o Indication and location: - CRO Left great toe o 2.0 ATA for 90 Minutes without Air Breaks o One treatment per day (delivered Monday through Friday unless otherwise specified in Special Instructions below): o Total # of Treatments: - 40 o Antihistamine 30 minutes prior to HBO Treatment, difficulty clearing ears. o Finger stick Blood Glucose Pre- and Post- HBOT Treatment. o Follow Hyperbaric Oxygen Gene Protocol Wound Treatment Wound #2 - Toe Great Wound Laterality: Plantar,  Left Primary Dressing: Hydrofera Blue Ready Transfer Foam, 4x5 (in/in) 1 x Per Day/15 Days Discharge Instructions: double layer cut to size of wound Secondary Dressing: Gauze 1 x Per Day/15 Days Discharge Instructions: As directed: dry, moistened with saline or moistened with Dakins Solution Secured With: Medipore Tape - 34M Medipore H Soft Cloth  Surgical Tape, 2x2 (in/yd) 1 x Per Day/15 Days Gene Brown, Gene Brown ([id number]) Wound #3 - Toe Great Wound Laterality: Dorsal, Left Primary Dressing: Hydrofera Blue Ready Transfer Foam, 4x5 (in/in) 1 x Per Day/15 Days Discharge Instructions: double layer cut to size of wound Secondary Dressing: Gauze 1 x Per Day/15 Days Discharge Instructions: As directed: dry, moistened with saline or moistened with Dakins Solution Secured With: Medipore Tape - 34M Medipore H Soft Cloth Surgical Tape, 2x2 (in/yd) 1 x Per Day/15 Days Gene Brown the result meets the hospital definition of a critical result, follow hospital policy. A. Give patient an 8 ounce Glucerna Shake, an 8 ounce Ensure, or 8 ounces of a Glucerna/Ensure equivalent dietary supplement*. B. Wait 30 minutes. Brown result is 71 mg/dl to 130 mg/dl: C. Retest patientos capillary blood glucose (CBG). D. Brown result greater than or equal to 110 mg/dl, proceed with HBO. Brown result less than 110 mg/dl, notify HBO physician and consider holding HBO. Brown result is 131 mg/dl to 249 mg/dl: A. Proceed with HBO. A. Notify HBO physician and await physician orders. B. It is recommended to hold HBO and do Brown result is 250 mg/dl or greater: blood/urine ketone testing. C. Brown the result meets the hospital definition of a critical result, follow hospital policy. POST-HBO Gene INTERVENTIONS ACTION INTERVENTION Obtain post HBO capillary blood glucose (ensure 1 physician order is in chart). A. Notify HBO physician and await physician orders. 2 Brown result is 70 mg/dl or below: B. Brown the result meets the hospital definition of a critical result, follow hospital policy. A. Give patient an 8 ounce Glucerna  Shake, an 8 ounce Ensure, or 8 ounces of a Glucerna/Ensure equivalent dietary supplement*. B. Wait 15 minutes for symptoms of hypoglycemia (i.e. nervousness, anxiety, Brown result is 71 mg/dl to 100 mg/dl: sweating, chills, clamminess, irritability, confusion, tachycardia or dizziness). C. Brown patient asymptomatic, discharge patient. Brown patient symptomatic, repeat capillary blood glucose (CBG) and notify HBO physician. Brown result is 101 mg/dl to 249 mg/dl: A. Discharge patient. A. Notify HBO physician and await physician orders. B. It is recommended to do blood/urine Brown result is 250 mg/dl or greater: ketone testing. C. Brown the result meets the hospital definition of a critical result, follow hospital policy. *Juice or candies are NOT equivalent products. Brown patient refuses the Glucerna or Ensure, please consult the hospital dietitian for an appropriate substitute. Electronic Signature(s) Signed: 05/02/2022 4:20:15 PM By: Carlene Coria RN Signed: 05/02/2022 4:44:22 PM By: Worthy Keeler PA-C Entered By: Carlene Coria on 05/02/2022 13:27:50 Gene Brown ([id number]) CYLUS, [place] ([id number]) -------------------------------------------------------------------------------- Prescription 05/02/2022 Patient Name: Gene Brown Provider: Jeri Cos PA-C Date of Birth: 07/11/1951 NPI#: [id number] Sex: M DEA#: [id number] Phone #: [phone] License #: Patient Address: [facility] [place], [place] [address] [address], [place], [place] [address] [phone] Allergies Statins-Hmg-Coa Reductase Inhibitors Provider's Orders Patient to wear own  Velcro compression garment. Remove compression stockings every night before going to bed and put on every morning when getting up. Hand Signature: Date(s): Electronic Signature(s) Signed: 05/02/2022 4:20:15 PM By: Yevonne Pax RN Signed:  05/02/2022 4:44:22 PM By: Lenda Kelp PA-C Entered By: Yevonne Pax on 05/02/2022 13:27:50 Gene Brown ([id number]) --------------------------------------------------------------------------------  Problem List Details Patient Name: Gene Brown Date of Service: 05/02/2022 12:30 PM Medical Record Number: [id number] Patient Account Number: [id number] Date of Birth/Sex: 1950/12/07 (71 y.o. M) Treating RN: Yevonne Pax Primary Care Provider: Barbette Reichmann Other Clinician: Referring Provider: Barbette Reichmann Treating Provider/Extender: Rowan Blase in Treatment: 16 Active Problems ICD-10 Encounter Code Description Active Date MDM Diagnosis [phone] Other chronic osteomyelitis, left ankle and foot 04/25/2022 No Yes E11.621 Type 2 diabetes mellitus with foot ulcer 01/07/2022 No Yes L97.522 Non-pressure chronic ulcer of other part of left foot with fat layer 01/07/2022 No Yes exposed E11.42 Type 2 diabetes mellitus with diabetic polyneuropathy 01/07/2022 No Yes I10 Essential (primary) hypertension 01/07/2022 No Yes Inactive Problems Resolved Problems Electronic Signature(s) Signed: 05/02/2022 12:18:01 PM By: Lenda Kelp PA-C Entered By: Lenda Kelp on 05/02/2022 12:18:00 Gene Brown ([id number]) -------------------------------------------------------------------------------- Progress Note Details Patient Name: Gene Brown Date of Service: 05/02/2022 12:30 PM Medical Record Number: [id number] Patient Account Number: [id number] Date of Birth/Sex: 1951/04/11 (71 y.o. M) Treating RN: Yevonne Pax Primary Care Provider: Barbette Reichmann Other Clinician: Referring Provider: Barbette Reichmann Treating Provider/Extender: Rowan Blase in Treatment: 16 Subjective Chief Complaint Information obtained from Patient Left great toe ulcer History of Present Illness (HPI) 05/24/16; this is a 71 year old type II diabetic who probably has some degree of  polyneuropathy. He tells me that he has had an area on the plantar aspect of his left great toe that is been followed by podiatry for most the last year. He has a lot of callus, the podiatrist which shave this down put a dressing on and he would follow pad monthly or sometimes longer intervals. He has not noted any drainage or pain. He has not had a recent x-ray. No serious attempt at offloading this with modified footwear.. Last hemoglobin A1c that he knows about was over 10 a month ago he is working hard on this with his primary physician. More recently he has been soaking this with peroxide ABIs are noncompressible and the left leg. He was told by his podiatrist he has a bone spur underneath the wound but did not wish any surgical procedures. He has a caregiver at home for a very disabled wife, the nature of her illness is not certain 05/31/16 patient comes in much the same as last week amounts of callus nonviable subcutaneous tissue which required debridement. His x-ray suggested cortical bone irregularity. He will need an MRI. He tells me he has old screws/hardware left knee however he thinks he has had a subsequent MRI after this 06/07/16 at this point in time patient has been attempting to offload his wound as much as he could on his own. He has made a conscious effort to avoid placing any pressure on the toe as he was moving around and walking and I do believe this has payed off and what I am seeing at this point in time. overall the wound appears to be somewhat improved in my opinion he is not having any significant discomfort at this point in time. In fact he tells me he has no pain at all. There is no interval sign of surrounding infection in the  wound itself has actually decreased in size. Unfortunately the MRI was not ordered last week and therefore we do need to proceed with the MRI order today. again this is to evaluate the cortical bone irregularity noted on x-ray. 06/14/16 Patient has  continued to use the offloading shoe at this point in time. He tells me this is somewhat difficult to get used to walking and but nonetheless he is very strict about wearing it. Again I believe this has paid off very well for him and his wound appears to be greatly improved. He does have some callused area surrounding the wound proximally that has caused some fluid collection of maceration of the callused area. This likely will require debridement today. Some adherent slough noted over the wound bed. 06/21/16; patient's wound on the plantar aspect of his left great toe is totally epithelialized. His MRI of the foot is tomorrow to follow-up on the cortical irregularity noted on plain x-ray. He does not have diabetic foot where 06-28-16-He comes in today with a continued healed left great toe. MRI performed on 06/23/2016 shows no abscess osteomyelitis or septic joint. Patient was discharged from wound care center today. Readmission: 01-07-2022 upon evaluation today patient appears to be doing somewhat poorly in regard to total ulcer on his left foot. Fortunately there does not appear to be any signs of infection which is great news. Has been seen by Dr. Excell SeltzerBaker and he did request from Dr. Excell SeltzerBaker to come see us due to the fact that in the past several years back we were able to get one of his wounds healed about 4 weeks. Again I explained that that is always variable but nonetheless I definitely think we can help. Fortunately I see no evidence of infection. He does have a foot offloading shoe which I think is good to attempt for now he was able to get this healed before with a peg assist. Patient does have a history of type 2 diabetes, diabetic polyneuropathy, and hypertension. This current wound started on 12-09-2021. 01-20-2022 upon evaluation today patient appears to be doing poorly in regard to his great toe. I am actually very concerned based on what I am seeing here at this point about how things are  progressing. I do not believe there is any evidence of infection systemically that is obvious but locally I am concerned about the possibility this could even be an osteomyelitis type situation. 01-28-2022 upon evaluation today patient appears to be doing well with regard to his toe ulcer compared to what we were previous. Fortunately I do not see any signs of active infection at this time which is great news. No fevers, chills, nausea, vomiting, or diarrhea. With that being said the patient has not started his Augmentin yet which I definitely think he needs to get on board ASAP. I do see some signs of new skin growth I think he is using the front offloading shoe which is good for the time being. 02-04-2022 upon evaluation today patient appears to be doing okay in regard to his toe ulcer this is actually measuring a little bit smaller but still has a bit of callus buildup but not as much as we have seen in the past. Fortunately I do not see any evidence of active infection locally or systemically which is great news. No fevers, chills, nausea, vomiting, or diarrhea. Marland Kitchen. 02-14-2022 upon evaluation today patient's wound is actually showing signs of good improvement which is great news. Fortunately I do not see any evidence  of active infection locally or systemically which is great news as well. No fevers, chills, nausea, vomiting, or diarrhea. 02-21-2022 upon evaluation today patient's toe ulcer actually is showing signs of excellent improvement. I do not see any evidence of infection locally or systemically which is great news and overall I am extremely pleased with where we stand today. I do not see any signs of active infection locally or systemically which is great news. No fevers, chills, nausea, vomiting, or diarrhea. 7/24; plantar left great toe no real improvement since last week. Using silver alginate gauze. He has a forefoot offloading boot. He also has lymphedema and uses his compression pumps I  do not think this is adding any particular difficulties. It sounds as though he is still pretty active 03-07-2022 upon evaluation today patient appears to be doing excellent in regard to the wound compared to previous pictures. He still has a quite JASPER, HANF. ([id number]) deep wound on the toe good news is the x-ray did not show any signs of osteomyelitis which is excellent. He does need some debridement today. 03-14-2022 upon evaluation today patient appears to be doing well currently in regard to his wound. He has actually been tolerating the dressing changes without complication. With that being said the dressing seems to either be draining a lot or getting very wet otherwise I think we need to have him change this more frequently. 03-21-2022 upon evaluation today patient appears to be doing well with regard to his toe ulcer I am actually seeing signs of improvement which is great news. This is slow but nonetheless the skin around the edges of the wound are definitely improving as we proceed here. I do not see any evidence of active infection locally or systemically which is great news. 03-29-2022 upon evaluation today patient appears to be doing fairly well except for with regard to his toe being somewhat red. I do think that we may want to place him back on the Augmentin which was previously beneficial for him. He is not in disagreement with this. Otherwise I did perform some debridement of clearway some of the necrotic debris today he seems to be tolerating this without complication. 04-04-2022 upon evaluation today patient unfortunately is continued to have significant issues here with his toe ulcer. He actually has an abscess more proximal on the dorsal aspect of the toe which is new since last week. Subsequently he is on antibiotics already I think we need to proceed with an MRI to further evaluate this. 04-12-2022 upon evaluation today patient appears to be doing better in regard to his toe  ulcer. This actually is less red than what it was he is about done with the initial antibiotic he does has his MRI scheduled for Friday. The reason I am going to probably extend the Augmentin for the time being I will to make sure that we keep this under control especially Brown he potentially has a bone infection I do not going to be going without the antibiotic therapy. Patient is in agreement with that plan. 04-18-2022 upon evaluation today patient does have an MRI for review that was obtained on Sunday. 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 He has been on antibiotics I had him on Augmentin starting initially on January 25, 2022. Continuing to be a problem here. I do not  see any signs of infection spreading up his foot but the toe itself still has purulent drainage coming from it. Coupled with this I have had him on the antibiotics, Augmentin, and he is tolerating that quite well. He is actually been on this since 01-20-2022. During that time his wound is getting somewhat better. But is still been very slow which is what prompted the MRI due to the fact that I felt like at a later point he started develop an abscess on the top of his toe that then had me more concerned. At this point I do believe that he is probably can require hyperbaric oxygen therapy I think that this is the ideal treatment plan for limb salvage coupled with the antibiotic therapy and good wound care. 04-25-2022 upon evaluation today patient appears to be doing okay in regard to his foot ulcers. He actually has an opening on the dorsal aspect of the toe which does not directly connect to the plantar aspect. This is still open in both locations he did have some callus covering that did require sharp debridement today to clear this away. Fortunately there does not appear to be any evidence  of active infection at this point. 05-02-2022 upon evaluation today patient appears to be doing well in regard to his wounds. He has been tolerating the dressing changes without complication. With that being said he does have some evidence here of continued drainage. There is evidence of infection as well although I do believe that the medicine is helping to keep this under good control. His erythema is improved although the swelling of the great toe is still present. He has started HBO as of this morning. Objective Constitutional Well-nourished and well-hydrated in no acute distress. Vitals Time Taken: 1:00 PM, Height: 68 in, Weight: 217 lbs, BMI: 33, Temperature: 98.6 F, Pulse: 72 bpm, Respiratory Rate: 16 breaths/min, Blood Pressure: 128/86 mmHg. Respiratory normal breathing without difficulty. Psychiatric this patient is able to make decisions and demonstrates good insight into disease process. Alert and Oriented x 3. pleasant and cooperative. General Notes: Patient's wounds did require sharp debridement. Initially they appear to be much smaller but there was callus covering inspection of the plantar aspect once this was removed they still have depth to them and this is still something that does extend quite deep though the 2 wounds do not connect. Integumentary (Hair, Skin) Wound #2 status is Open. Original cause of wound was Gradually Appeared. The date acquired was: 12/09/2021. The wound has been in treatment 16 weeks. The wound is located on the [employer]. The wound measures 0.1cm length x 0.1cm width x 0.2cm depth; 0.008cm^2 area and 0.002cm^3 volume. There is Fat Layer (Subcutaneous Tissue) exposed. There is no undermining noted, however, there is tunneling at 9:00 with a maximum distance of 0.4cm. There is a medium amount of serosanguineous drainage noted. The wound margin is flat and intact. There is large (67-100%) pink, pale granulation within the wound bed. There is a  small (1-33%) amount of necrotic tissue within the wound bed including Adherent Slough. Wound #3 status is Open. Original cause of wound was Pressure Injury. The date acquired was: 03/25/2022. The wound has been in treatment 1 DAEQUAN, KOZMA V. ([id number]) weeks. The wound is located on the [employer]. The wound measures 0.3cm length x 0.4cm width x 0.4cm depth; 0.094cm^2 area and 0.038cm^3 volume. There is Fat Layer (Subcutaneous Tissue) exposed. There is tunneling at 3:00 with a maximum distance of 1.1cm. There is  a medium amount of serosanguineous drainage noted. The wound margin is distinct with the outline attached to the wound base. There is large (67-100%) red granulation within the wound bed. There is a small (1-33%) amount of necrotic tissue within the wound bed including Adherent Slough. 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 There was a Excisional Skin/Subcutaneous Tissue Debridement with a total area of 1 sq cm performed by Nelida Meuse., PA-C. With the following instrument(s): Curette to remove Viable and Non-Viable tissue/material. Material removed includes Callus, Subcutaneous Tissue, Slough, Skin: Dermis, and Skin: Epidermis. No specimens were taken. A time out was conducted at 13:20, prior to the start of the procedure. A Minimum amount of bleeding was controlled with Pressure. The procedure was tolerated well with a pain level of 0 throughout and a pain level of 0 following the procedure. Post Debridement Measurements:  0.4cm length x 0.6cm width x 0.3cm depth; 0.057cm^3 volume. Character of Wound/Ulcer Post Debridement is improved. Severity of Tissue Post Debridement is: Fat layer exposed. Post procedure Diagnosis Wound #2: Same as Pre-Procedure Wound #3 Pre-procedure diagnosis of Wound #3 is a Diabetic Wound/Ulcer of the Lower Extremity located on the Left,Dorsal Toe Great .Severity of Tissue Pre Debridement is: Fat layer exposed. There was a Excisional Skin/Subcutaneous Tissue Debridement with a total area of 0.25 sq cm performed by Nelida Meuse., PA-C. With the following instrument(s): Curette to remove Viable and Non-Viable tissue/material. Material removed includes Callus, Subcutaneous Tissue, Slough, Skin: Dermis, and Skin: Epidermis. No specimens were taken. A time out was conducted at 13:20, prior to the start of the procedure. A Minimum amount of bleeding was controlled with Pressure. The procedure was tolerated well with a pain level of 0 throughout and a pain level of 0 following the procedure. Post Debridement Measurements: 0.3cm length x 0.4cm width x 0.4cm depth; 0.038cm^3 volume. Character of Wound/Ulcer Post Debridement is improved. Severity of Tissue Post Debridement is: Fat layer exposed. Post procedure Diagnosis Wound #3: Same as Pre-Procedure Plan Follow-up Appointments: Wound #2 Left,Plantar Toe Great: Return Appointment in 1 week. Nurse Visit as needed Bathing/ Shower/ Hygiene: May shower; gently cleanse wound with antibacterial soap, rinse and pat dry prior to dressing wounds No tub bath. Anesthetic (Use 'Patient Medications' Section for Anesthetic Order Entry): Lidocaine applied to wound bed Edema Control - Lymphedema / Segmental Compressive Device / Other: Patient to wear own Velcro compression garment. Remove compression stockings every night before going to bed and put on every morning when getting up. Compression Pump: Use compression pump on left lower extremity for 60  minutes, twice daily. Compression Pump: Use compression pump on right lower extremity for 60 minutes, twice daily. DO YOUR BEST to sleep in the bed at night. DO NOT sleep in your recliner. Long hours of sitting in a recliner leads to swelling of the legs and/or potential wounds on your backside. Off-Loading: Offloading felt to foot. - donut cut in offloading felt DAXTIN, LEIKER ([id number]) Other: - front off-loader left Additional Orders / Instructions: Follow Nutritious Diet and Increase Protein Intake Hyperbaric Oxygen Therapy: Indication and location: - CRO Left great toe  2.0 ATA for 90 Minutes without Air Breaks One treatment per day (delivered Monday through Friday unless otherwise specified in Special Instructions below): Total # of Treatments: - 40 Antihistamine 30 minutes prior to HBO Treatment, difficulty clearing ears. Finger stick Blood Glucose Pre- and Post- HBOT Treatment. Follow Hyperbaric Oxygen Gene Protocol WOUND #2: - Toe Great Wound Laterality: Plantar, Left Primary Dressing: Hydrofera Blue Ready Transfer Foam, 4x5 (in/in) 1 x Per Day/15 Days Discharge Instructions: double layer cut to size of wound Secondary Dressing: Gauze 1 x Per Day/15 Days Discharge Instructions: As directed: dry, moistened with saline or moistened with Dakins Solution Secured With: Medipore Tape - 70M Medipore H Soft Cloth Surgical Tape, 2x2 (in/yd) 1 x Per Day/15 Days WOUND #3: - Toe Great Wound Laterality: Dorsal, Left Primary Dressing: Hydrofera Blue Ready Transfer Foam, 4x5 (in/in) 1 x Per Day/15 Days Discharge Instructions: double layer cut to size of wound Secondary Dressing: Gauze 1 x Per Day/15 Days Discharge Instructions: As directed: dry, moistened with saline or moistened with Dakins Solution Secured With: Medipore Tape - 70M Medipore H Soft Cloth Surgical Tape, 2x2 (in/yd) 1 x Per Day/15 Days 1. I am good recommend that we go ahead and continue with the recommendation for  wound care measures as before and the patient is in agreement with the plan. This includes the use of the Hydrofera Blue rope which I think is doing well for both locations. 2. Also can recommend that we have the patient continue to monitor for any signs of worsening or infection which is worsening. Obviously Brown anything changes he should contact the office and let me know. 3. I am also can recommend that the patient should continue with the gauze and roll gauze to secure in place. We will see patient back for reevaluation in 1 week here in the clinic. Brown anything worsens or changes patient will contact our office for additional recommendations. Electronic Signature(s) Signed: 05/02/2022 2:24:22 PM By: Lenda Kelp PA-C Entered By: Lenda Kelp on 05/02/2022 14:24:22 Gene Brown ([id number]) -------------------------------------------------------------------------------- SuperBill Details Patient Name: Gene Brown Date of Service: 05/02/2022 Medical Record Number: [id number] Patient Account Number: [id number] Date of Birth/Sex: 03-Feb-1951 (71 y.o. M) Treating RN: Yevonne Pax Primary Care Provider: Barbette Reichmann Other Clinician: Referring Provider: Barbette Reichmann Treating Provider/Extender: Rowan Blase in Treatment: 16 Diagnosis Coding ICD-10 Codes Code Description [phone] Other chronic osteomyelitis, left ankle and foot E11.621 Type 2 diabetes mellitus with foot ulcer 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osed E11.42 Type 2 diabetes mellitus with diabetic polyneuropathy I10 Essential (primary) hypertension Facility Procedures CPT4 Code: 82993716 Description: 11042 - DEB SUBQ TISSUE 20 SQ CM/< Modifier: Quantity: 1 CPT4 Code: Description: ICD-10 Diagnosis Description 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 Modifier: osed Quantity: Physician Procedures CPT4 Code:  9678938 Description: 11042 - WC PHYS SUBQ TISS 20 SQ CM Modifier: Quantity: 1 CPT4 Code: Description: ICD-10 Diagnosis Description 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 Modifier: osed Quantity: Electronic Signature(s) Signed: 05/02/2022 2:24:33 PM By: Lenda Kelp PA-C Entered By: Lenda Kelp on 05/02/2022 14:24:33

## 2022-05-02 NOTE — Progress Notes (Signed)
GRAINGER, MCCARLEY ([id number]) Visit Report for 05/02/2022 Problem List Details Patient Name: Gene Brown, Gene Brown Date of Service: 05/02/2022 10:00 AM Medical Record Number: [id number] Patient Account Number: [id number] Date of Birth/Sex: Aug 31, 1950 (71 y.o. M) Treating RN: Cornell Barman Primary Care Provider: Tracie Harrier Other Clinician: Jacqulyn Bath Referring Provider: Tracie Harrier Treating Provider/Extender: Skipper Cliche in Treatment: 16 Active Problems ICD-10 Encounter Code Description Active Date MDM Diagnosis [phone] Other chronic osteomyelitis, left ankle and foot 04/25/2022 No Yes E11.621 Type 2 diabetes mellitus with foot ulcer 01/07/2022 No Yes L97.522 Non-pressure chronic ulcer of other part of left foot with fat layer 01/07/2022 No Yes exposed E11.42 Type 2 diabetes mellitus with diabetic polyneuropathy 01/07/2022 No Yes I10 Essential (primary) hypertension 01/07/2022 No Yes Inactive Problems Resolved Problems Electronic Signature(s) Signed: 05/02/2022 4:43:35 PM By: Worthy Keeler PA-C Entered By: Worthy Keeler on 05/02/2022 16:43:35 Gene Brown ([id number]) -------------------------------------------------------------------------------- SuperBill Details Patient Name: Gene Brown Date of Service: 05/02/2022 Medical Record Number: [id number] Patient Account Number: [id number] Date of Birth/Sex: 02-Nov-1950 (71 y.o. M) Treating RN: Cornell Barman Primary Care Provider: Tracie Harrier Other Clinician: Jacqulyn Bath Referring Provider: Tracie Harrier Treating Provider/Extender: Skipper Cliche in Treatment: 16 Diagnosis Coding ICD-10 Codes Code Description [phone] Other chronic osteomyelitis, left ankle and foot E11.621 Type 2 diabetes mellitus with foot ulcer 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osed E11.42 Type 2 diabetes mellitus with diabetic polyneuropathy I10 Essential (primary)  hypertension Facility Procedures CPT4 Code: 19622297 Description: (Facility Use Only) HBOT, full body chamber, 75min Modifier: Quantity: 4 Physician Procedures CPT4 Code: 9892119 Description: 41740 - WC PHYS HYPERBARIC OXYGEN THERAPY Modifier: Quantity: 1 CPT4 Code: Description: ICD-10 Diagnosis Description M86.672 Other chronic osteomyelitis, left ankle and foot 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os E11.621 Type 2 diabetes mellitus with foot ulcer Modifier: ed Quantity: Electronic Signature(s) Signed: 05/02/2022 4:44:10 PM By: Worthy Keeler PA-C Previous Signature: 05/02/2022 1:52:21 PM Version By: Enedina Finner RCP, RRT, CHT Entered By: Worthy Keeler on 05/02/2022 16:44:09

## 2022-05-02 NOTE — Progress Notes (Signed)
Gene Brown, Gene Brown ([id number]) Visit Report for 05/02/2022 Arrival Information Details Patient Name: Gene Brown, Gene Brown Date of Service: 05/02/2022 10:00 AM Medical Record Number: [id number] Patient Account Number: [id number] Date of Birth/Sex: 09-27-1950 (71 y.o. M) Treating RN: Gene Brown Primary Care Gene Brown: Gene Brown Other Clinician: [name] Referring Gene Brown: Gene Brown Treating Gene Brown/Extender: Gene Brown in Treatment: 90 Visit Information History Since Last Visit Added or deleted any medications: No Patient Arrived: Ambulatory Any new allergies or adverse reactions: No Arrival Time: 09:20 Had a fall or experienced change in No Accompanied By: self activities of daily living that may affect Transfer Assistance: None risk of falls: Patient Identification Verified: Yes Signs or symptoms of abuse/neglect since last visito No Secondary Verification Process Completed: Yes Hospitalized since last visit: No Patient Requires Transmission-Based No Implantable device outside of the clinic excluding No Precautions: cellular tissue based products placed in the center Patient Has Alerts: Yes since last visit: Patient Alerts: Patient on Blood Has Dressing in Place as Prescribed: Yes Thinner Pain Present Now: No 01/07/2022 [place]>220 on left Electronic Signature(s) Signed: 05/02/2022 1:52:21 PM By: Gene Brown RCP, RRT, CHT Entered By: Gene Brown on 05/02/2022 13:42:02 Gene Brown ([id number]) -------------------------------------------------------------------------------- Encounter Discharge Information Details Patient Name: Gene Brown Date of Service: 05/02/2022 10:00 AM Medical Record Number: [id number] Patient Account Number: [id number] Date of Birth/Sex: 09-11-1950 (71 y.o. M) Treating RN: Gene Brown Primary Care Dejean Tribby: Gene Brown Other Clinician: [name] Referring Ival Pacer: Gene Brown Treating Nabilah Davoli/Extender: Gene Brown in Treatment: 16 Encounter Discharge Information Items Discharge Condition: Stable Ambulatory Status: Ambulatory Discharge Destination: Home Transportation: Private Auto Accompanied By: self Schedule Follow-up Appointment: Yes Clinical Summary of Care: Notes Patient has an HBO treatment scheduled on 05/03/22 at 0800 am. Electronic Signature(s) Signed: 05/02/2022 1:52:21 PM By: Gene Brown RCP, RRT, CHT Entered By: Gene Brown on 05/02/2022 13:45:45 Gene Brown ([id number]) -------------------------------------------------------------------------------- Vitals Details Patient Name: Gene Brown Date of Service: 05/02/2022 10:00 AM Medical Record Number: [id number] Patient Account Number: [id number] Date of Birth/Sex: June 30, 1951 (71 y.o. M) Treating RN: Gene Brown Primary Care Jaxie Racanelli: Gene Brown Other Clinician: [name] Referring Hanson Medeiros: Gene Brown Treating Remie Mathison/Extender: Gene Brown in Treatment: 16 Vital Signs Time Taken: 09:24 Temperature (F): 98.3 Height (in): 68 Pulse (bpm): 78 Weight (lbs): 217 Respiratory Rate (breaths/min): 18 Body Mass Index (BMI): 33 Blood Pressure (mmHg): 138/78 Capillary Blood Glucose (mg/dl): 206 Reference Range: 80 - 120 mg / dl Electronic Signature(s) Signed: 05/02/2022 1:52:21 PM By: Gene Brown RCP, RRT, CHT Entered By: Gene Brown on 05/02/2022 13:42:06

## 2022-05-02 NOTE — Progress Notes (Addendum)
MIRZA, FESSEL ([id number]) Visit Report for 05/02/2022 Arrival Information Details Patient Name: Gene Brown, Gene Brown Date of Service: 05/02/2022 12:30 PM Medical Record Number: [id number] Patient Account Number: [id number] Date of Birth/Sex: May 11, 1951 (71 y.o. M) Treating RN: Yevonne Pax Primary Care Eulalie Speights: Barbette Reichmann Other Clinician: Referring Arieanna Pressey: Barbette Reichmann Treating Remus Hagedorn/Extender: Rowan Blase in Treatment: 16 Visit Information History Since Last Visit All ordered tests and consults were completed: No Patient Arrived: Ambulatory Added or deleted any medications: No Arrival Time: 12:49 Any new allergies or adverse reactions: No Accompanied By: self Had a fall or experienced change in No Transfer Assistance: None activities of daily living that may affect Patient Identification Verified: Yes risk of falls: Secondary Verification Process Completed: Yes Signs or symptoms of abuse/neglect since last visito No Patient Requires Transmission-Based No Hospitalized since last visit: No Precautions: Implantable device outside of the clinic excluding No Patient Has Alerts: Yes cellular tissue based products placed in the center Patient Alerts: Patient on Blood Thinner since last visit: 01/07/2022 [place]>220 on Has Dressing in Place as Prescribed: Yes left Pain Present Now: No Electronic Signature(s) Signed: 05/02/2022 4:20:15 PM By: Yevonne Pax RN Entered By: Yevonne Pax on 05/02/2022 12:54:30 Gene Brown ([id number]) -------------------------------------------------------------------------------- Clinic Level of Care Assessment Details Patient Name: Gene Brown Date of Service: 05/02/2022 12:30 PM Medical Record Number: [id number] Patient Account Number: [id number] Date of Birth/Sex: 1951/05/13 (71 y.o. M) Treating RN: Yevonne Pax Primary Care Aryaa Bunting: Barbette Reichmann Other Clinician: Referring Maylie Ashton: Barbette Reichmann Treating  Margues Filippini/Extender: Rowan Blase in Treatment: 16 Clinic Level of Care Assessment Items TOOL 1 Quantity Score []  - Use when EandM and Procedure is performed on INITIAL visit 0 ASSESSMENTS - Nursing Assessment / Reassessment []  - General Physical Exam (combine w/ comprehensive assessment (listed just below) when performed on new 0 pt. evals) []  - 0 Comprehensive Assessment (HX, ROS, Risk Assessments, Wounds Hx, etc.) ASSESSMENTS - Wound and Skin Assessment / Reassessment []  - Dermatologic / Skin Assessment (not related to wound area) 0 ASSESSMENTS - Ostomy and/or Continence Assessment and Care []  - Incontinence Assessment and Management 0 []  - 0 Ostomy Care Assessment and Management (repouching, etc.) PROCESS - Coordination of Care []  - Simple Patient / Family Education for ongoing care 0 []  - 0 Complex (extensive) Patient / Family Education for ongoing care []  - 0 Staff obtains , Records, Test Results / Process Orders []  - 0 Staff telephones HHA, Nursing Homes / Clarify orders / etc []  - 0 Routine Transfer to another Facility (non-emergent condition) []  - 0 Routine Hospital Admission (non-emergent condition) []  - 0 New Admissions / / Ordering NPWT, Apligraf, etc. []  - 0 Emergency Hospital Admission (emergent condition) PROCESS - Special Needs []  - Pediatric / Minor Patient Management 0 []  - 0 Isolation Patient Management []  - 0 Hearing / Language / Visual special needs []  - 0 Assessment of Community assistance (transportation, D/C planning, etc.) []  - 0 Additional assistance / Altered mentation []  - 0 Support Surface(s) Assessment (bed, cushion, seat, etc.) INTERVENTIONS - Miscellaneous []  - External ear exam 0 []  - 0 Patient Transfer (multiple staff / / Similar devices) []  - 0 Simple Staple / Suture removal (25 or less) []  - 0 Complex Staple / Suture removal (26 or more) []  - 0 Hypo/Hyperglycemic Management (do not  check if billed separately) []  - 0 Ankle / Brachial Index (ABI) - do not check if billed separately Has the patient been seen at the hospital  within the last three years: Yes Total Score: 0 Level Of Care: ____ Gene Brown ([id number]) Electronic Signature(s) Signed: 05/02/2022 4:20:15 PM By: Carlene Coria RN Entered By: Carlene Coria on 05/02/2022 13:30:19 Gene Brown ([id number]) -------------------------------------------------------------------------------- Encounter Discharge Information Details Patient Name: Gene Brown Date of Service: 05/02/2022 12:30 PM Medical Record Number: [id number] Patient Account Number: [id number] Date of Birth/Sex: [birth year] (71 y.o. M) Treating RN: Carlene Coria Primary Care Lota Leamer: Tracie Harrier Other Clinician: Referring Jamaury Gumz: Tracie Harrier Treating Hadiyah Maricle/Extender: Skipper Cliche in Treatment: 16 Encounter Discharge Information Items Post Procedure Vitals Discharge Condition: Stable Temperature (F): 98.6 Ambulatory Status: Cane Pulse (bpm): 72 Discharge Destination: Home Respiratory Rate (breaths/min): 16 Transportation: Private Auto Blood Pressure (mmHg): 128/86 Accompanied By: self Schedule Follow-up Appointment: Yes Clinical Summary of Care: Electronic Signature(s) Signed: 05/02/2022 4:20:15 PM By: Carlene Coria RN Entered By: Carlene Coria on 05/02/2022 13:31:22 Gene Brown ([id number]) -------------------------------------------------------------------------------- Lower Extremity Assessment Details Patient Name: Gene Brown Date of Service: 05/02/2022 12:30 PM Medical Record Number: [id number] Patient Account Number: [id number] Date of Birth/Sex: January 31, 1951 (71 y.o. M) Treating RN: Carlene Coria Primary Care Aniylah Avans: Tracie Harrier Other Clinician: Referring Tari Lecount: Tracie Harrier Treating Felicha Frayne/Extender: Skipper Cliche in Treatment: 16 Edema Assessment Assessed: [Left: No]  [Right: No] Edema: [Left: Ye] [Right: s] Calf Left: Right: Point of Measurement: 28 cm From Medial Instep 45 cm Ankle Left: Right: Point of Measurement: 11 cm From Medial Instep 29 cm Vascular Assessment Pulses: Dorsalis Pedis Palpable: [Left:Yes] Electronic Signature(s) Signed: 05/02/2022 4:20:15 PM By: Carlene Coria RN Entered By: Carlene Coria on 05/02/2022 13:03:47 Gene Brown ([id number]) -------------------------------------------------------------------------------- Multi Wound Chart Details Patient Name: Gene Brown Date of Service: 05/02/2022 12:30 PM Medical Record Number: [id number] Patient Account Number: [id number] Date of Birth/Sex: 11-Sep-1950 (71 y.o. 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 Extremity Comorbid History: Cataracts, Sickle Cell Disease, Sleep Cataracts, Sickle Cell Disease, Sleep N/A Apnea, Hypertension, Type II Apnea, Hypertension, Type II Diabetes, Gout, Osteoarthritis Diabetes, Gout, Osteoarthritis Date Acquired: 12/09/2021 03/25/2022 N/A Weeks of Treatment: 16 1 N/A Wound Status: Open Open N/A Wound Recurrence: No No N/A Measurements L x W x D (cm) 0.1x0.1x0.2 0.3x0.4x0.4 N/A Area (cm) : 0.008 0.094 N/A Volume (cm) : 0.002 0.038 N/A % Reduction in Area: 97.20% 0.00% N/A % Reduction in Volume: 98.60% 49.30% N/A Position 1 (o'clock): 9 3 Maximum Distance 1 (cm): 0.4 1.1 Tunneling:  Yes Yes N/A Classification: Grade 3 Grade 3 N/A Exudate Amount: Medium Medium N/A Exudate Type: Serosanguineous Serosanguineous N/A Exudate Color: red, brown red, brown N/A Wound Margin: Flat and Intact Distinct, outline attached N/A Granulation Amount: Large (67-100%) Large (67-100%) N/A Granulation Quality: Pink, Pale Red N/A Necrotic Amount: Small (1-33%) Small (1-33%) N/A Exposed Structures: Fat Layer (Subcutaneous Tissue): Fat Layer (Subcutaneous Tissue): N/A Yes Yes Fascia: No Tendon: No Muscle: No Joint: No Bone: No Epithelialization: None None N/A Treatment Notes Electronic Signature(s) Signed: 05/02/2022 4:20:15 PM By: Carlene Coria RN Gene Brown ([id number]) Entered By: Carlene Coria on 05/02/2022 13:03:58 Gene Brown ([id number]) -------------------------------------------------------------------------------- Multi-Disciplinary Care Plan Details Patient Name:  Gene Brown Date of Service: 05/02/2022 12:30 PM Medical Record Number: [id number] Patient Account Number: [id number] Date of Birth/Sex: 04/29/1951 (71 y.o. M) Treating RN: Yevonne Pax Primary Care Jigar Zielke: Barbette Reichmann Other Clinician: Referring Synda Bagent: Barbette Reichmann Treating Kamoria Lucien/Extender: Rowan Blase in Treatment: 16 Active Inactive Electronic Signature(s) Signed: 05/02/2022 4:20:15 PM By: Yevonne Pax RN Entered By: Yevonne Pax on 05/02/2022 13:03:52 Gene Brown ([id number]) -------------------------------------------------------------------------------- Pain Assessment Details Patient Name: Gene Brown Date of Service: 05/02/2022 12:30 PM Medical Record Number: [id number] Patient Account Number: [id number] Date of Birth/Sex: [birth year] (71 y.o. M) Treating RN: Yevonne Pax Primary Care Neriyah Cercone: Barbette Reichmann Other Clinician: Referring Cedric Denison: Barbette Reichmann Treating Jonie Burdell/Extender: Rowan Blase in Treatment: 16 Active Problems Location  of Pain Severity and Description of Pain Patient Has Paino No Site Locations Pain Management and Medication Current Pain Management: Electronic Signature(s) Signed: 05/02/2022 4:20:15 PM By: Yevonne Pax RN Entered By: Yevonne Pax on 05/02/2022 12:54:38 Gene Brown ([id number]) -------------------------------------------------------------------------------- Patient/Caregiver Education Details Patient Name: Gene Brown Date of Service: 05/02/2022 12:30 PM Medical Record Number: [id number] Patient Account Number: [id number] Date of Birth/Gender: 07-20-1951 (71 y.o. M) Treating RN: Yevonne Pax Primary Care Physician: Barbette Reichmann Other Clinician: Referring Physician: Barbette Reichmann Treating Physician/Extender: Rowan Blase in Treatment: 16 Education Assessment Education Provided To: Patient Education Topics Provided Wound/Skin Impairment: Methods: Explain/Verbal Responses: State content correctly Electronic Signature(s) Signed: 05/02/2022 4:20:15 PM By: Yevonne Pax RN Entered By: Yevonne Pax on 05/02/2022 13:30:31 Gene Brown ([id number]) -------------------------------------------------------------------------------- Wound Assessment Details Patient Name: Gene Brown Date of Service: 05/02/2022 12:30 PM Medical Record Number: [id number] Patient Account Number: [id number] Date of Birth/Sex: 04-Apr-1951 (71 y.o. M) Treating RN: Yevonne Pax Primary Care Trinton Prewitt: Barbette Reichmann Other Clinician: Referring Ishia Tenorio: Barbette Reichmann Treating Luan Urbani/Extender: Rowan Blase in Treatment: 16 Wound Status Wound Number: 2 Primary Diabetic Wound/Ulcer of the Lower Extremity Etiology: Wound Location: Left, Plantar Toe Great Wound Open Wounding Event: Gradually Appeared Status: Date Acquired: 12/09/2021 Comorbid Cataracts, Sickle Cell Disease, Sleep Apnea, Weeks Of Treatment: 16 History: Hypertension, Type II Diabetes, Gout,  Osteoarthritis Clustered Wound: No Photos Wound Measurements Length: (cm) 0.1 % Red Width: (cm) 0.1 % Red Depth: (cm) 0.2 Epith Area: (cm) 0.008 Tunn Volume: (cm) 0.002 P Ma uction in Area: 97.2% uction in Volume: 98.6% elialization: None eling: Yes osition (o'clock): 9 ximum Distance: (cm) 0.4 Undermining: No Wound Description Classification: Grade 3 Foul Wound Margin: Flat and Intact Slou Exudate Amount: Medium Exudate Type: Serosanguineous Exudate Color: red, brown Odor After Cleansing: No gh/Fibrino Yes Wound Bed Granulation Amount: Large (67-100%) Exposed Structure Granulation Quality: Pink, Pale Fascia Exposed: No Necrotic Amount: Small (1-33%) Fat Layer (Subcutaneous Tissue) Exposed: Yes Necrotic Quality: Adherent Slough Tendon Exposed: No Muscle Exposed: No Joint Exposed: No Bone Exposed: No Treatment Notes Wound #2 (Toe Great) Wound Laterality: Plantar, Left Cleanser KIMANI, HOVIS ([id number]) Peri-Wound Care Topical Primary Dressing Hydrofera Blue Ready Transfer Foam, 4x5 (in/in) Discharge Instruction: double layer cut to size of wound Secondary Dressing Gauze Discharge Instruction: As directed: dry, moistened with saline or moistened with Dakins Solution Secured With Medipore Tape - 80M Medipore H Soft Cloth Surgical Tape, 2x2 (in/yd) Compression Wrap Compression Stockings Add-Ons Electronic Signature(s) Signed: 05/02/2022 4:20:15 PM By: Yevonne Pax RN Entered By: Yevonne Pax on 05/02/2022 13:02:30 Gene Brown ([id number]) -------------------------------------------------------------------------------- Wound Assessment Details Patient Name: Gene Brown Date of Service: 05/02/2022 12:30 PM Medical Record Number: [id number] Patient Account Number: [id number] Date of Birth/Sex: [birth year] (71 y.o.  M) Treating RN: Yevonne Pax Primary Care Trevonn Hallum: Barbette Reichmann Other Clinician: Referring Odes Lolli: Barbette Reichmann Treating  Sidra Oldfield/Extender: Rowan Blase in Treatment: 16 Wound Status Wound Number: 3 Primary Diabetic Wound/Ulcer of the Lower Extremity Etiology: Wound Location: Left, Dorsal Toe Great Wound Open Wounding Event: Pressure Injury Status: Date Acquired: 03/25/2022 Comorbid Cataracts, Sickle Cell Disease, Sleep Apnea, Weeks Of Treatment: 1 History: Hypertension, Type II Diabetes, Gout, Osteoarthritis Clustered Wound: No Photos Wound Measurements Length: (cm) 0.3 % Re Width: (cm) 0.4 % Re Depth: (cm) 0.4 Epit Area: (cm) 0.094 Tun Volume: (cm) 0.038 M duction in Area: 0% duction in Volume: 49.3% helialization: None neling: Yes Position (o'clock): 3 aximum Distance: (cm) 1.1 Wound Description Classification: Grade 3 Fou Wound Margin: Distinct, outline attached Slo Exudate Amount: Medium Exudate Type: Serosanguineous Exudate Color: red, brown l Odor After Cleansing: No ugh/Fibrino Yes Wound Bed Granulation Amount: Large (67-100%) Exposed Structure Granulation Quality: Red Fat Layer (Subcutaneous Tissue) Exposed: Yes Necrotic Amount: Small (1-33%) Necrotic Quality: Adherent Slough Treatment Notes Wound #3 (Toe Great) Wound Laterality: Dorsal, Left Cleanser Peri-Wound Care Topical Primary Dressing GUST, EUGENE ([id number]) Hydrofera Blue Ready Transfer Foam, 4x5 (in/in) Discharge Instruction: double layer cut to size of wound Secondary Dressing Gauze Discharge Instruction: As directed: dry, moistened with saline or moistened with Dakins Solution Secured With Medipore Tape - 72M Medipore H Soft Cloth Surgical Tape, 2x2 (in/yd) Compression Wrap Compression Stockings Add-Ons Electronic Signature(s) Signed: 05/02/2022 4:20:15 PM By: Yevonne Pax RN Entered By: Yevonne Pax on 05/02/2022 13:02:11 Gene Brown ([id number]) -------------------------------------------------------------------------------- Vitals Details Patient Name: Gene Brown Date of  Service: 05/02/2022 12:30 PM Medical Record Number: [id number] Patient Account Number: [id number] Date of Birth/Sex: 06-Dec-1950 (71 y.o. M) Treating RN: Yevonne Pax Primary Care Jeanett Antonopoulos: Barbette Reichmann Other Clinician: Referring Carolynne Schuchard: Barbette Reichmann Treating Verl Kitson/Extender: Rowan Blase in Treatment: 16 Vital Signs Time Taken: 13:00 Temperature (F): 98.6 Height (in): 68 Pulse (bpm): 72 Weight (lbs): 217 Respiratory Rate (breaths/min): 16 Body Mass Index (BMI): 33 Blood Pressure (mmHg): 128/86 Reference Range: 80 - 120 mg / dl Electronic Signature(s) Signed: 05/02/2022 4:20:15 PM By: Yevonne Pax RN Entered By: Yevonne Pax on 05/02/2022 13:03:14

## 2022-05-02 NOTE — Progress Notes (Signed)
Gene Brown, Gene Brown ([id number]) Visit Report for 05/02/2022 HBO Details Patient Name: Gene Brown, Gene Brown Date of Service: 05/02/2022 10:00 AM Medical Record Number: [id number] Patient Account Number: [id number] Date of Birth/Sex: 1951/01/19 (71 y.o. 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 HBO Treatment Details Treatment Number: 1 Patient Type: Outpatient Chamber Type: Monoplace Chamber Serial #: [id number] Treatment Protocol: 2.0 ATA with 90 minutes oxygen, and no air breaks Treatment Details Compression Rate Down: 1.5 psi / minute De-Compression Rate Up: 1.5 psi / minute Compress Tx Pressure Air breaks and breathing periods Decompress Decompress Begins Reached (leave unused spaces blank) Begins Ends Chamber Pressure (ATA) 1 2 - - - - - - 2 1 Clock Time (24 hr) 10:42 10:34 - - - - - - 12:24 12:34 Treatment Length: 112 (minutes) Treatment Segments: 4 Vital Signs Capillary Blood Glucose Reference Range: 80 - 120 mg / dl HBO Diabetic Blood Glucose Intervention Range: <131 mg/dl or >956 mg/dl Time Vitals Blood Respiratory Capillary Blood Glucose Pulse Action Type: Pulse: Temperature: Taken: Pressure: Rate: Glucose (mg/dl): Meter #: Oximetry (%) Taken: Pre 09:24 138/78 78 18 98.3 206 1 none per protocol Post 12:55 128/86 72 18 98.6 109 1 none per protocol Pre-Treatment Ear Evaluation Left Right Clear: Yes Clear: Yes Intact: Yes Intact: Yes Color: grey Color: Grey PE Tubes inserted: No PE Tubes inserted: No Left Teed Scale: Grade 0 Right Teed Scale: Grade 0 Treatment Response Treatment Toleration:  Well Treatment Completion Treatment Completed without Adverse Event Status: HBO Attestation I certify that I supervised this HBO treatment in accordance with Medicare guidelines. A trained emergency response team is readily Yes available per hospital policies and procedures. Continue HBOT as ordered. Yes Electronic Signature(s) Signed: 05/02/2022 4:44:05 PM By: Gene Kelp PA-C Previous Signature: 05/02/2022 1:54:37 PM Version By: Gene Brown, Gene Brown, Gene Brown Gene Brown, Gene Brown ([id number]) Previous Signature: 05/02/2022 1:52:21 PM Version By: Gene Brown Gene Brown, Gene Brown, Gene Brown Entered By: Gene Brown on 05/02/2022 16:44:04 Gene Brown ([id number]) -------------------------------------------------------------------------------- HBO Safety Checklist Details Patient Name: Gene Brown Date of Service: 05/02/2022 10:00 AM Medical Record Number: [id number] Patient Account Number: [id number] Date of Birth/Sex: 1950/09/27 (71 y.o. M) Treating RN: Gene Brown Primary Care Vlada Uriostegui: Gene Brown Other Clinician: Izetta Brown Referring Gene Brown: Gene Brown Treating Almond Fitzgibbon/Extender: Gene Brown in Treatment: 16 HBO Safety Checklist Items Safety Checklist Consent Form Signed Patient voided / foley secured and emptied When did you last eato 07:00 am Last dose of injectable or oral agent 07:00 am Ostomy pouch emptied and vented if applicable NA All implantable devices assessed, documented and approved NA Intravenous access site secured and place NA Valuables secured Linens and cotton and cotton/polyester blend (less than 51% polyester) Personal oil-based products / skin lotions / body lotions removed Wigs or hairpieces removed NA Smoking or tobacco materials removed NA Books / newspapers / magazines / loose paper removed NA Cologne, aftershave, perfume and deodorant removed Jewelry removed (may wrap wedding band) Make-up  removed NA Hair care products removed Battery operated devices (external) removed NA Heating patches and chemical warmers removed NA Titanium eyewear removed NA Nail polish cured greater than 10 hours NA Casting material cured greater than 10 hours NA Hearing aids  removed NA Loose dentures or partials removed NA Prosthetics have been removed NA Patient demonstrates correct use of air break device (if applicable) Patient concerns have been addressed Patient grounding bracelet on and cord attached to chamber Specifics for Inpatients (complete in addition to above) Medication sheet sent with patient Intravenous medications needed or due during therapy sent with patient Drainage tubes (e.g. nasogastric tube or chest tube secured and vented) Endotracheal or Tracheotomy tube secured Cuff deflated of air and inflated with saline Airway suctioned Electronic Signature(s) Signed: 05/02/2022 1:52:21 PM By: Enedina Finner Gene Brown, Gene Brown, Gene Brown Entered By: Enedina Finner on 05/02/2022 13:42:11

## 2022-05-03 ENCOUNTER — Encounter: Payer: Medicare Other | Admitting: Physician Assistant

## 2022-05-03 DIAGNOSIS — E11621 Type 2 diabetes mellitus with foot ulcer: Secondary | ICD-10-CM | POA: Diagnosis not present

## 2022-05-03 LAB — GLUCOSE, CAPILLARY
Glucose-Capillary: 123 mg/dL — ABNORMAL HIGH (ref 70–99)
Glucose-Capillary: 154 mg/dL — ABNORMAL HIGH (ref 70–99)
Glucose-Capillary: 103 mg/dL — ABNORMAL HIGH (ref 70–99)

## 2022-05-03 NOTE — Progress Notes (Signed)
TAHARI, ABER ([id number]) Visit Report for 05/03/2022 HBO Details Patient Name: Gene Brown, Gene Brown Date of Service: 05/03/2022 8:00 AM Medical Record Number: [id number] Patient Account Number: [id number] Date of Birth/Sex: [birth year] (71 y.o. 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 HBO Treatment Details Treatment Number: 2 Patient Type: Outpatient Chamber Type: Monoplace Chamber Serial #: [id number] Treatment Protocol: 2.0 ATA with 90 minutes oxygen, and no air breaks Treatment Details Compression Rate Down: 1.5 psi / minute De-Compression Rate Up: 1.5 psi / minute Compress Tx Pressure Air breaks and breathing periods Decompress Decompress Begins Reached (leave unused spaces blank) Begins Ends Chamber Pressure (ATA) 1 2 - - - - - - 2 1 Clock Time (24 hr) 08:24 08:36 - - - - - - 10:06 10:17 Treatment Length: 113 (minutes) Treatment Segments: 4 Vital Signs Capillary Blood Glucose Reference Range: 80 - 120 mg / dl HBO Diabetic Blood Glucose Intervention Range: <131 mg/dl or >249 mg/dl Time Capillary Blood Glucose Pulse Blood Respiratory Action Type: Vitals Pulse: Temperature: Glucose Meter Oximetry Pressure: Rate: Taken: Taken: (mg/dl): #: (%) Gave Ensure per protocol; PA made Pre 08:02 132/76 84 18 98.2 123 1 aware Pre 08:20 154 proceed with HBO Post 10:36 122/76 72 18 98.1 103 1 none per protocol Treatment Response Treatment Toleration: Well Treatment Completion Treatment Completed without Adverse Event Status: Electronic Signature(s) Signed: 05/03/2022 11:43:10 AM By: Enedina Finner RCP, RRT, CHT Signed: 05/03/2022 4:52:08 PM By: Worthy Keeler PA-C Entered By: Stark Jock, Amado Nash on 05/03/2022 11:34:17 Gene Brown ([id number]) -------------------------------------------------------------------------------- HBO Safety Checklist Details Patient Name: Gene Brown Date of Service: 05/03/2022 8:00 AM Medical Record Number: [id number] Patient Account Number: [id number] Date of Birth/Sex: 1950/09/29 (71 y.o. M) Treating RN: Cornell Barman Primary Care Doral Digangi: Tracie Harrier Other Clinician: Jacqulyn Bath Referring Stanly Si: Tracie Harrier Treating Rosilyn Coachman/Extender: Skipper Cliche in Treatment: 16 HBO Safety Checklist Items Safety Checklist Consent Form Signed Patient voided / foley secured and emptied When did you last eato 07:00 am Last dose of injectable or oral agent 05/02/22 pm Lantus Ostomy pouch emptied and vented if applicable NA All implantable devices assessed, documented and approved NA Intravenous access site secured and place NA Valuables secured Linens and cotton and cotton/polyester blend (less than 51% polyester) Personal oil-based products / skin lotions / body lotions removed Wigs or hairpieces removed NA Smoking or tobacco materials removed NA Books / newspapers / magazines / loose paper removed NA Cologne, aftershave, perfume and deodorant removed Jewelry removed (may wrap wedding band) Make-up removed NA Hair care products removed Battery operated devices (external) removed NA Heating patches and chemical warmers removed NA Titanium eyewear removed NA Nail polish cured greater than 10 hours NA Casting material cured greater than 10 hours NA Hearing aids removed NA Loose dentures or partials removed NA Prosthetics have been removed NA Patient demonstrates correct use of air break device (if applicable) Patient concerns have been addressed Patient grounding bracelet on and cord attached to  chamber Specifics for Inpatients (complete in addition to above) Medication sheet sent with patient Intravenous medications needed or due during therapy sent with patient Drainage tubes (e.g. nasogastric tube or chest tube secured and vented) Endotracheal or Tracheotomy tube secured Cuff  deflated of air and inflated with saline Airway suctioned Electronic Signature(s) Signed: 05/03/2022 11:43:10 AM By: Enedina Finner RCP, RRT, CHT Entered By: Enedina Finner on 05/03/2022 08:45:23

## 2022-05-03 NOTE — Progress Notes (Signed)
LYNDAL, REGGIO ([id number]) Visit Report for 05/03/2022 Arrival Information Details Patient Name: Gene Brown, Gene Brown Date of Service: 05/03/2022 8:00 AM Medical Record Number: [id number] Patient Account Number: [id number] Date of Birth/Sex: 1950-09-21 (71 y.o. M) Treating RN: Cornell Barman Primary Care Ammar Moffatt: Tracie Harrier Other Clinician: Jacqulyn Bath Referring Shantana Christon: Tracie Harrier Treating Blimie Vaness/Extender: Skipper Cliche in Treatment: 16 Visit Information History Since Last Visit Added or deleted any medications: No Patient Arrived: Cane Any new allergies or adverse reactions: No Arrival Time: 07:55 Had a fall or experienced change in No Accompanied By: self activities of daily living that may affect Transfer Assistance: None risk of falls: Patient Identification Verified: Yes Signs or symptoms of abuse/neglect since last visito No Secondary Verification Process Completed: Yes Hospitalized since last visit: No Patient Requires Transmission-Based No Implantable device outside of the clinic excluding No Precautions: cellular tissue based products placed in the center Patient Has Alerts: Yes since last visit: Patient Alerts: Patient on Blood Pain Present Now: No Thinner 01/07/2022 [place]>220 on left Electronic Signature(s) Signed: 05/03/2022 11:43:10 AM By: Enedina Finner RCP, RRT, CHT Entered By: Enedina Finner on 05/03/2022 08:42:45 Gene Brown ([id number]) -------------------------------------------------------------------------------- Encounter Discharge Information Details Patient Name: Gene Brown Date of Service: 05/03/2022 8:00 AM Medical Record Number: [id number] Patient Account Number: [id number] Date of Birth/Sex: 07-11-1951 (71 y.o. M) Treating RN: Cornell Barman Primary Care Cena Bruhn: Tracie Harrier Other Clinician: Jacqulyn Bath Referring Jovontae Banko: Tracie Harrier Treating Brenna Friesenhahn/Extender: Skipper Cliche in  Treatment: 16 Encounter Discharge Information Items Discharge Condition: Stable Ambulatory Status: Cane Discharge Destination: Home Transportation: Private Auto Accompanied By: self Schedule Follow-up Appointment: Yes Clinical Summary of Care: Notes Patient has an HBO treatment scheduled on 05/04/22 at 08:00 am. Electronic Signature(s) Signed: 05/03/2022 11:43:10 AM By: Enedina Finner RCP, RRT, CHT Entered By: Enedina Finner on 05/03/2022 11:36:02 Gene Brown ([id number]) -------------------------------------------------------------------------------- Vitals Details Patient Name: Gene Brown Date of Service: 05/03/2022 8:00 AM Medical Record Number: [id number] Patient Account Number: [id number] Date of Birth/Sex: 1950-12-09 (71 y.o. M) Treating RN: Cornell Barman Primary Care Kalep Full: Tracie Harrier Other Clinician: Jacqulyn Bath Referring Danella Philson: Tracie Harrier Treating Ryer Asato/Extender: Skipper Cliche in Treatment: 16 Vital Signs Time Taken: 08:02 Temperature (F): 98.2 Height (in): 68 Pulse (bpm): 84 Weight (lbs): 217 Respiratory Rate (breaths/min): 18 Body Mass Index (BMI): 33 Blood Pressure (mmHg): 132/76 Capillary Blood Glucose (mg/dl): 123 Reference Range: 80 - 120 mg / dl Electronic Signature(s) Signed: 05/03/2022 11:43:10 AM By: Enedina Finner RCP, RRT, CHT Entered By: Enedina Finner on 05/03/2022 08:43:54

## 2022-05-04 ENCOUNTER — Encounter (HOSPITAL_BASED_OUTPATIENT_CLINIC_OR_DEPARTMENT_OTHER): Payer: Medicare Other | Admitting: Internal Medicine

## 2022-05-04 DIAGNOSIS — E11621 Type 2 diabetes mellitus with foot ulcer: Secondary | ICD-10-CM | POA: Diagnosis not present

## 2022-05-04 DIAGNOSIS — L97522 Non-pressure chronic ulcer of other part of left foot with fat layer exposed: Secondary | ICD-10-CM | POA: Diagnosis not present

## 2022-05-04 DIAGNOSIS — M86672 Other chronic osteomyelitis, left ankle and foot: Secondary | ICD-10-CM

## 2022-05-04 LAB — GLUCOSE, CAPILLARY
Glucose-Capillary: 151 mg/dL — ABNORMAL HIGH (ref 70–99)
Glucose-Capillary: 80 mg/dL (ref 70–99)
Glucose-Capillary: 137 mg/dL — ABNORMAL HIGH (ref 70–99)

## 2022-05-04 NOTE — Progress Notes (Signed)
LIBERTY, STEAD ([id number]) Visit Report for 05/04/2022 HBO Details Patient Name: Gene Brown, Gene Brown Date of Service: 05/04/2022 8:00 AM Medical Record Number: [id number] Patient Account Number: [id number] Date of Birth/Sex: 02-Jul-1951 (71 y.o. 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 HBO Treatment Details Treatment Number: 3 Patient Type: Outpatient Chamber Type: Monoplace Chamber Serial #: [id number] Treatment Protocol: 2.0 ATA with 90 minutes oxygen, and no air breaks Treatment Details Compression Rate Down: 1.5 psi / minute De-Compression Rate Up: 1.5 psi / minute Compress Tx Pressure Air breaks and breathing periods Decompress Decompress Begins Reached (leave unused spaces blank) Begins Ends Chamber Pressure (ATA) 1 2 - - - - - - 2 1 Clock Time (24 hr) 08:10 08:21 - - - - - - 09:51 10:02 Treatment Length: 112 (minutes) Treatment Segments: 4 Vital Signs Capillary Blood Glucose Reference Range: 80 - 120 mg / dl HBO Diabetic Blood Glucose Intervention Range: <131 mg/dl or >249 mg/dl Time Capillary Blood Glucose Pulse Blood Respiratory Action Type: Vitals Pulse: Temperature: Glucose Meter Oximetry Pressure: Rate: Taken: Taken: (mg/dl): #: (%) Pre 07:50 122/78 78 18 97.5 151 1 none per protocol Gave Ensure per protocol and Post 10:28 130/70 66 18 97.6 80 1 J.Heber [place], DO Treatment Response Treatment Toleration: Well Treatment Completion Treatment Completed without Adverse Event Status: HBO Attestation I certify that I supervised this HBO treatment in accordance with Medicare guidelines. A  trained emergency response team is readily Yes available per hospital policies and procedures. Continue HBOT as ordered. Yes Electronic Signature(s) Signed: 05/04/2022 12:26:22 PM By: Kalman Shan DO Previous Signature: 05/04/2022 11:08:08 AM Version By: Enedina Finner RCP, RRT, CHT Entered By: Kalman Shan on 05/04/2022 12:25:17 Gene Brown ([id number]) -------------------------------------------------------------------------------- HBO Safety Checklist Details Patient Name: Gene Brown Date of Service: 05/04/2022 8:00 AM Medical Record Number: [id number] Patient Account Number: [id number] Date of Birth/Sex: 10-15-1950 (71 y.o. M) Treating RN: Cornell Barman Primary Care Other Atienza: Tracie Harrier Other Clinician: Jacqulyn Bath Referring Donnell Wion: Tracie Harrier Treating Leslea Vowles/Extender: Yaakov Guthrie in Treatment: 16 HBO Safety Checklist Items Safety Checklist Consent Form Signed Patient voided / foley secured and emptied When did you last eato 07:00 am Last dose of injectable or oral agent 05/03/22 pm Ostomy pouch emptied and vented if applicable NA All implantable devices assessed, documented and approved NA Intravenous access site secured and place NA Valuables secured Linens and cotton and cotton/polyester blend (less than 51% polyester) Personal oil-based products / skin lotions / body lotions removed Wigs or hairpieces removed NA Smoking or tobacco materials removed NA Books / newspapers / magazines / loose paper removed NA Cologne, aftershave, perfume and deodorant removed Jewelry removed (may wrap wedding band) Make-up removed NA Hair care products removed Battery operated devices (external) removed NA Heating patches and chemical warmers removed NA Titanium eyewear removed NA Nail polish cured greater than 10 hours NA Casting material cured greater than 10 hours NA Hearing aids removed NA Loose dentures or  partials removed NA Prosthetics have been removed NA Patient demonstrates correct use of air break device (if applicable) Patient concerns have been addressed Patient grounding bracelet on and cord attached to chamber Specifics for Inpatients (complete in addition to above) Medication sheet sent with patient  Intravenous medications needed or due during therapy sent with patient Drainage tubes (e.g. nasogastric tube or chest tube secured and vented) Endotracheal or Tracheotomy tube secured Cuff deflated of air and inflated with saline Airway suctioned Electronic Signature(s) Signed: 05/04/2022 11:08:08 AM By: Aleda Grana RCP, RRT, CHT Entered By: Aleda Grana on 05/04/2022 08:51:19

## 2022-05-04 NOTE — Progress Notes (Signed)
RAYMIR, FROMMELT ([id number]) Visit Report for 05/04/2022 Arrival Information Details Patient Name: Gene Brown, Gene Brown Date of Service: 05/04/2022 8:00 AM Medical Record Number: [id number] Patient Account Number: [id number] Date of Birth/Sex: 1951/05/02 (71 y.o. M) Treating RN: Cornell Barman Primary Care Ceri Mayer: Tracie Harrier Other Clinician: Jacqulyn Bath Referring Mubarak Bevens: Tracie Harrier Treating Kristyna Bradstreet/Extender: Yaakov Guthrie in Treatment: 16 Visit Information History Since Last Visit Added or deleted any medications: No Patient Arrived: Ambulatory Any new allergies or adverse reactions: No Arrival Time: 07:48 Had a fall or experienced change in No Accompanied By: self activities of daily living that may affect Transfer Assistance: None risk of falls: Patient Identification Verified: Yes Signs or symptoms of abuse/neglect since last visito No Secondary Verification Process Completed: Yes Hospitalized since last visit: No Patient Requires Transmission-Based No Implantable device outside of the clinic excluding No Precautions: cellular tissue based products placed in the center Patient Has Alerts: Yes since last visit: Patient Alerts: Patient on Blood Has Dressing in Place as Prescribed: Yes Thinner Has Footwear/Offloading in Place as Prescribed: Yes 01/07/2022 [place]>220 on Left: Surgical Shoe with Pressure left Relief Insole Pain Present Now: No Electronic Signature(s) Signed: 05/04/2022 11:08:08 AM By: Enedina Finner RCP, RRT, CHT Entered By: Enedina Finner on 05/04/2022 08:46:05 Gene Brown ([id number]) -------------------------------------------------------------------------------- Encounter Discharge Information Details Patient Name: Gene Brown Date of Service: 05/04/2022 8:00 AM Medical Record Number: [id number] Patient Account Number: [id number] Date of Birth/Sex: [birth year] (71 y.o. M) Treating RN: Cornell Barman Primary  Care Matheus Spiker: Tracie Harrier Other Clinician: Jacqulyn Bath Referring Haneen Bernales: Tracie Harrier Treating Kyana Aicher/Extender: Yaakov Guthrie in Treatment: 16 Encounter Discharge Information Items Discharge Condition: Stable Ambulatory Status: Ambulatory Discharge Destination: Home Transportation: Private Auto Accompanied By: self Schedule Follow-up Appointment: Yes Clinical Summary of Care: Notes Patient has an HBO treatment scheduled on 05/05/22 at 08:00 am. Electronic Signature(s) Signed: 05/04/2022 11:08:08 AM By: Enedina Finner RCP, RRT, CHT Entered By: Enedina Finner on 05/04/2022 11:00:20 Gene Brown ([id number]) -------------------------------------------------------------------------------- Vitals Details Patient Name: Gene Brown Date of Service: 05/04/2022 8:00 AM Medical Record Number: [id number] Patient Account Number: [id number] Date of Birth/Sex: Jul 20, 1951 (71 y.o. M) Treating RN: Cornell Barman Primary Care Bryanah Sidell: Tracie Harrier Other Clinician: Jacqulyn Bath Referring Taliana Mersereau: Tracie Harrier Treating Antoino Westhoff/Extender: Yaakov Guthrie in Treatment: 16 Vital Signs Time Taken: 07:50 Temperature (F): 97.5 Height (in): 68 Pulse (bpm): 78 Weight (lbs): 217 Respiratory Rate (breaths/min): 18 Body Mass Index (BMI): 33 Blood Pressure (mmHg): 122/78 Capillary Blood Glucose (mg/dl): 151 Reference Range: 80 - 120 mg / dl Electronic Signature(s) Signed: 05/04/2022 11:08:08 AM By: Enedina Finner RCP, RRT, CHT Entered By: Enedina Finner on 05/04/2022 08:47:28

## 2022-05-05 ENCOUNTER — Encounter: Payer: Medicare Other | Admitting: Physician Assistant

## 2022-05-05 DIAGNOSIS — E11621 Type 2 diabetes mellitus with foot ulcer: Secondary | ICD-10-CM | POA: Diagnosis not present

## 2022-05-05 LAB — GLUCOSE, CAPILLARY
Glucose-Capillary: 170 mg/dL — ABNORMAL HIGH (ref 70–99)
Glucose-Capillary: 95 mg/dL (ref 70–99)

## 2022-05-05 NOTE — Progress Notes (Signed)
MARCELLA, DUNNAWAY ([id number]) Visit Report for 05/05/2022 Arrival Information Details Patient Name: Gene Brown, Gene Brown Date of Service: 05/05/2022 8:00 AM Medical Record Number: [id number] Patient Account Number: [id number] Date of Birth/Sex: 1951-07-02 (71 y.o. M) Treating RN: Carlene Coria Primary Care Xinyi Batton: Tracie Harrier Other Clinician: Jacqulyn Bath Referring Linley Moxley: Tracie Harrier Treating Trust Crago/Extender: Skipper Cliche in Treatment: 24 Visit Information History Since Last Visit Added or deleted any medications: No Patient Arrived: Ambulatory Any new allergies or adverse reactions: No Arrival Time: 07:50 Had a fall or experienced change in No Accompanied By: self activities of daily living that may affect Transfer Assistance: None risk of falls: Patient Identification Verified: Yes Signs or symptoms of abuse/neglect since last visito No Secondary Verification Process Completed: Yes Hospitalized since last visit: No Patient Requires Transmission-Based No Implantable device outside of the clinic excluding No Precautions: cellular tissue based products placed in the center Patient Has Alerts: Yes since last visit: Patient Alerts: Patient on Blood Pain Present Now: No Thinner 01/07/2022 [place]>220 on left Electronic Signature(s) Signed: 05/05/2022 1:05:21 PM By: Enedina Finner RCP, RRT, CHT Entered By: Enedina Finner on 05/05/2022 09:10:27 Gene Brown ([id number]) -------------------------------------------------------------------------------- Encounter Discharge Information Details Patient Name: Gene Brown Date of Service: 05/05/2022 8:00 AM Medical Record Number: [id number] Patient Account Number: [id number] Date of Birth/Sex: Feb 19, 1951 (71 y.o. M) Treating RN: Carlene Coria Primary Care Ryelle Ruvalcaba: Tracie Harrier Other Clinician: Jacqulyn Bath Referring Arelyn Gauer: Tracie Harrier Treating Anden Bartolo/Extender: Skipper Cliche in Treatment: 16 Encounter Discharge Information Items Discharge Condition: Stable Ambulatory Status: Ambulatory Discharge Destination: Home Transportation: Private Auto Accompanied By: self Schedule Follow-up Appointment: Yes Clinical Summary of Care: Notes Patient has an HBO treatment scheduled on 05/06/22 at 07:30 am. Electronic Signature(s) Signed: 05/05/2022 1:05:21 PM By: Enedina Finner RCP, RRT, CHT Entered By: Enedina Finner on 05/05/2022 11:26:50 Gene Brown ([id number]) -------------------------------------------------------------------------------- Vitals Details Patient Name: Gene Brown Date of Service: 05/05/2022 8:00 AM Medical Record Number: [id number] Patient Account Number: [id number] Date of Birth/Sex: April 05, 1951 (71 y.o. M) Treating RN: Carlene Coria Primary Care Everrett Lacasse: Tracie Harrier Other Clinician: Jacqulyn Bath Referring Knowledge Escandon: Tracie Harrier Treating Cato Liburd/Extender: Skipper Cliche in Treatment: 16 Vital Signs Time Taken: 08:04 Temperature (F): 97.9 Height (in): 68 Pulse (bpm): 78 Weight (lbs): 217 Respiratory Rate (breaths/min): 18 Body Mass Index (BMI): 33 Blood Pressure (mmHg): 140/78 Capillary Blood Glucose (mg/dl): 170 Reference Range: 80 - 120 mg / dl Electronic Signature(s) Signed: 05/05/2022 1:05:21 PM By: Enedina Finner RCP, RRT, CHT Entered By: Enedina Finner on 05/05/2022 44:03:47

## 2022-05-05 NOTE — Progress Notes (Signed)
MADOX, CORKINS ([id number]) Visit Report for 05/05/2022 HBO Details Patient Name: Gene Brown, Gene Brown Date of Service: 05/05/2022 8:00 AM Medical Record Number: [id number] Patient Account Number: [id number] Date of Birth/Sex: [birth year] (71 y.o. 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 HBO Treatment Details Treatment Number: 4 Patient Type: Outpatient Chamber Type: Monoplace Chamber Serial #: [id number] Treatment Protocol: 2.0 ATA with 90 minutes oxygen, and no air breaks Treatment Details Compression Rate Down: 1.5 psi / minute De-Compression Rate Up: 1.5 psi / minute Compress Tx Pressure Air breaks and breathing periods Decompress Decompress Begins Reached (leave unused spaces blank) Begins Ends Chamber Pressure (ATA) 1 2 - - - - - - 2 1 Clock Time (24 hr) 08:21 08:32 - - - - - - 10:03 10:13 Treatment Length: 112 (minutes) Treatment Segments: 4 Vital Signs Capillary Blood Glucose Reference Range: 80 - 120 mg / dl HBO Diabetic Blood Glucose Intervention Range: <131 mg/dl or >629 mg/dl Capillary Time Glucose Pulse Blood Respiratory Blood Action Type: Vitals Pulse: Temperature: Meter Oximetry Pressure: Rate: Glucose Taken: Taken: #: (%) (mg/dl): Pre 52:84 132/44 78 18 97.9 170 1 none per protocol Gave Ensure per Allen Derry, III, Post 10:39 122/80 72 18 98.2 95 1 PA-C and released Treatment Response Treatment Toleration: Well Treatment Completion Treatment Completed without Adverse Event Status: Electronic Signature(s) Signed: 05/05/2022 1:05:21 PM By: Aleda Grana RCP, RRT,  CHT Signed: 05/05/2022 3:32:36 PM By: Lenda Kelp PA-C Previous Signature: 05/05/2022 10:43:55 AM Version By: Lenda Kelp PA-C Entered By: Aleda Grana on 05/05/2022 11:25:49 Gene Brown ([id number]) -------------------------------------------------------------------------------- HBO Safety Checklist Details Patient Name: Gene Brown Date of Service: 05/05/2022 8:00 AM Medical Record Number: [id number] Patient Account Number: [id number] Date of Birth/Sex: Jan 16, 1951 (71 y.o. M) Treating RN: Yevonne Pax Primary Care Townsend Cudworth: Barbette Reichmann Other Clinician: Izetta Dakin Referring Easton Sivertson: Barbette Reichmann Treating Forrest Demuro/Extender: Rowan Blase in Treatment: 16 HBO Safety Checklist Items Safety Checklist Consent Form Signed Patient voided / foley secured and emptied When did you last eato 07:00 am Last dose of injectable or oral agent 05/04/22 pm Ostomy pouch emptied and vented if applicable NA All implantable devices assessed, documented and approved NA Intravenous access site secured and place NA Valuables secured Linens and cotton and cotton/polyester blend (less than 51% polyester) Personal oil-based products / skin lotions / body lotions removed Wigs or hairpieces removed NA Smoking or tobacco materials removed NA Books / newspapers / magazines / loose paper removed NA Cologne, aftershave, perfume and deodorant removed Jewelry removed (may wrap wedding band) Make-up removed NA Hair care products removed Battery operated devices (external) removed NA Heating patches and chemical warmers removed NA Titanium eyewear removed NA Nail polish cured greater than 10 hours NA Casting material cured greater than 10 hours NA Hearing aids removed NA Loose dentures or partials removed NA Prosthetics have been removed NA Patient demonstrates correct use of air break device (if applicable) Patient concerns have been  addressed Patient grounding bracelet on and cord attached to chamber Specifics for Inpatients (complete in addition to above) Medication sheet sent with patient Intravenous medications needed or due during therapy sent with patient Drainage tubes (e.g. nasogastric tube or chest tube secured and vented)  Endotracheal or Tracheotomy tube secured Cuff deflated of air and inflated with saline Airway suctioned Electronic Signature(s) Signed: 05/05/2022 1:05:21 PM By: Enedina Finner RCP, RRT, CHT Entered By: Enedina Finner on 05/05/2022 09:24:44

## 2022-05-06 ENCOUNTER — Encounter: Payer: Medicare Other | Admitting: Physician Assistant

## 2022-05-06 DIAGNOSIS — E11621 Type 2 diabetes mellitus with foot ulcer: Secondary | ICD-10-CM | POA: Diagnosis not present

## 2022-05-06 LAB — GLUCOSE, CAPILLARY
Glucose-Capillary: 278 mg/dL — ABNORMAL HIGH (ref 70–99)
Glucose-Capillary: 127 mg/dL — ABNORMAL HIGH (ref 70–99)

## 2022-05-06 NOTE — Progress Notes (Signed)
SHON, INDELICATO ([id number]) Visit Report for 05/06/2022 Arrival Information Details Patient Name: MALE, Gene Brown Date of Service: 05/06/2022 7:30 AM Medical Record Number: [id number] Patient Account Number: [id number] Date of Birth/Sex: Dec 20, 1950 (71 y.o. M) Treating RN: Carlene Coria Primary Care Kristjan Derner: Tracie Harrier Other Clinician: Jacqulyn Bath Referring Navdeep Halt: Tracie Harrier Treating Petr Bontempo/Extender: Skipper Cliche in Treatment: 62 Visit Information History Since Last Visit Added or deleted any medications: No Patient Arrived: Cane Any new allergies or adverse reactions: No Arrival Time: 07:35 Had a fall or experienced change in No Accompanied By: self activities of daily living that may affect Transfer Assistance: None risk of falls: Patient Identification Verified: Yes Signs or symptoms of abuse/neglect since last visito No Secondary Verification Process Completed: Yes Hospitalized since last visit: No Patient Requires Transmission-Based No Implantable device outside of the clinic excluding No Precautions: cellular tissue based products placed in the center Patient Has Alerts: Yes since last visit: Patient Alerts: Patient on Blood Has Dressing in Place as Prescribed: Yes Thinner Has Footwear/Offloading in Place as Prescribed: Yes 01/07/2022 [place]>220 on Left: Surgical Shoe with Pressure left Relief Insole Pain Present Now: No Electronic Signature(s) Signed: 05/06/2022 11:38:52 AM By: Enedina Finner RCP, RRT, CHT Entered By: Enedina Finner on 05/06/2022 08:39:07 Gene Brown ([id number]) -------------------------------------------------------------------------------- Encounter Discharge Information Details Patient Name: Gene Brown Date of Service: 05/06/2022 7:30 AM Medical Record Number: [id number] Patient Account Number: [id number] Date of Birth/Sex: 10-15-1950 (71 y.o. M) Treating RN: Carlene Coria Primary Care  Kandyce Dieguez: Tracie Harrier Other Clinician: Jacqulyn Bath Referring Trapper Meech: Tracie Harrier Treating Pacey Willadsen/Extender: Skipper Cliche in Treatment: 17 Encounter Discharge Information Items Discharge Condition: Stable Ambulatory Status: Cane Discharge Destination: Home Transportation: Private Auto Accompanied By: self Schedule Follow-up Appointment: Yes Clinical Summary of Care: Notes Patient has an HBO treatment scheduled on 05/09/22 at 08:00 am. Electronic Signature(s) Signed: 05/06/2022 11:38:52 AM By: Enedina Finner RCP, RRT, CHT Entered By: Enedina Finner on 05/06/2022 11:29:43 Gene Brown ([id number]) -------------------------------------------------------------------------------- Vitals Details Patient Name: Gene Brown Date of Service: 05/06/2022 7:30 AM Medical Record Number: [id number] Patient Account Number: [id number] Date of Birth/Sex: 1951/01/12 (71 y.o. M) Treating RN: Carlene Coria Primary Care Esterlene Atiyeh: Tracie Harrier Other Clinician: Jacqulyn Bath Referring Grantley Savage: Tracie Harrier Treating Bettye Sitton/Extender: Skipper Cliche in Treatment: 17 Vital Signs Time Taken: 07:47 Temperature (F): 97.9 Height (in): 68 Pulse (bpm): 72 Weight (lbs): 217 Respiratory Rate (breaths/min): 18 Body Mass Index (BMI): 33 Blood Pressure (mmHg): 128/70 Capillary Blood Glucose (mg/dl): 278 Reference Range: 80 - 120 mg / dl Electronic Signature(s) Signed: 05/06/2022 11:38:52 AM By: Enedina Finner RCP, RRT, CHT Entered By: Enedina Finner on 05/06/2022 08:39:54

## 2022-05-06 NOTE — Progress Notes (Signed)
Gene Brown, Gene Brown ([id number]) Visit Report for 05/06/2022 HBO Details Patient Name: Gene Brown, Gene Brown Date of Service: 05/06/2022 7:30 AM Medical Record Number: [id number] Patient Account Number: [id number] Date of Birth/Sex: June 15, 1951 (71 y.o. 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 HBO Treatment Details Treatment Number: 5 Patient Type: Outpatient Chamber Type: Monoplace Chamber Serial #: [id number] Treatment Protocol: 2.0 ATA with 90 minutes oxygen, and no air breaks Treatment Details Compression Rate Down: 1.5 psi / minute De-Compression Rate Up: 1.5 psi / minute Compress Tx Pressure Air breaks and breathing periods Decompress Decompress Begins Reached (leave unused spaces blank) Begins Ends Chamber Pressure (ATA) 1 2 - - - - - - 2 1 Clock Time (24 hr) 07:53 08:05 - - - - - - 09:35 09:45 Treatment Length: 112 (minutes) Treatment Segments: 4 Vital Signs Capillary Blood Glucose Reference Range: 80 - 120 mg / dl HBO Diabetic Blood Glucose Intervention Range: <131 mg/dl or >249 mg/dl Time Vitals Blood Respiratory Capillary Blood Glucose Pulse Action Type: Pulse: Temperature: Taken: Pressure: Rate: Glucose (mg/dl): Meter #: Oximetry (%) Taken: Pre 07:47 128/70 72 18 97.9 278 1 none per protocol Post 10:08 122/76 60 18 94.7 127 1 none per protocol Treatment Response Treatment Toleration: Well Treatment Completion Treatment Completed without Adverse Event Status: Electronic Signature(s) Signed: 05/06/2022 11:37:09 AM By: Worthy Keeler PA-C Signed: 05/06/2022 11:38:52 AM By: Enedina Finner RCP, RRT, CHT Entered By: Enedina Finner on 05/06/2022 11:28:14 Gene Brown ([id number]) -------------------------------------------------------------------------------- HBO Safety Checklist Details Patient Name: Gene Brown Date of Service: 05/06/2022 7:30 AM Medical Record Number: [id number] Patient Account Number: [id number] Date of Birth/Sex: Jul 25, 1951 (71 y.o. M) Treating RN: Carlene Coria Primary Care Marnesha Gagen: Tracie Harrier Other Clinician: Jacqulyn Bath Referring Jennice Renegar: Tracie Harrier Treating Azure Budnick/Extender: Skipper Cliche in Treatment: 17 HBO Safety Checklist Items Safety Checklist Consent Form Signed Patient voided / foley secured and emptied When did you last eato 07:00 am Last dose of injectable or oral agent 05/05/22 pm Ostomy pouch emptied and vented if applicable NA All implantable devices assessed, documented and approved NA Intravenous access site secured and place NA Valuables secured Linens and cotton and cotton/polyester blend (less than 51% polyester) Personal oil-based products / skin lotions / body lotions removed Wigs or hairpieces removed NA Smoking or tobacco materials removed NA Books / newspapers / magazines / loose paper removed NA Cologne, aftershave, perfume and deodorant removed Jewelry removed (may wrap wedding band) Make-up removed NA Hair care products removed Battery operated devices (external) removed NA Heating patches and chemical warmers removed NA Titanium eyewear removed NA Nail polish cured greater than 10 hours NA Casting material cured greater than 10 hours NA Hearing aids removed NA Loose dentures or partials removed NA Prosthetics have been removed NA Patient demonstrates correct use of air break device (if applicable) Patient concerns have been addressed Patient grounding bracelet on and cord attached to chamber Specifics for Inpatients (complete in addition  to above) Medication sheet sent with patient Intravenous medications needed or due during therapy sent with patient Drainage tubes (e.g. nasogastric tube or chest tube secured and vented) Endotracheal or Tracheotomy tube secured Cuff deflated of air and inflated with saline Airway suctioned Electronic Signature(s)  Signed: 05/06/2022 11:38:52 AM By: Aleda Grana RCP, RRT, CHT Entered By: Aleda Grana on 05/06/2022 08:41:18

## 2022-05-09 ENCOUNTER — Encounter: Payer: Medicare Other | Attending: Physician Assistant | Admitting: Physician Assistant

## 2022-05-09 DIAGNOSIS — M86672 Other chronic osteomyelitis, left ankle and foot: Secondary | ICD-10-CM | POA: Insufficient documentation

## 2022-05-09 DIAGNOSIS — I10 Essential (primary) hypertension: Secondary | ICD-10-CM | POA: Diagnosis not present

## 2022-05-09 DIAGNOSIS — L97522 Non-pressure chronic ulcer of other part of left foot with fat layer exposed: Secondary | ICD-10-CM | POA: Insufficient documentation

## 2022-05-09 DIAGNOSIS — E11621 Type 2 diabetes mellitus with foot ulcer: Secondary | ICD-10-CM | POA: Insufficient documentation

## 2022-05-09 DIAGNOSIS — E1142 Type 2 diabetes mellitus with diabetic polyneuropathy: Secondary | ICD-10-CM | POA: Insufficient documentation

## 2022-05-09 DIAGNOSIS — E1169 Type 2 diabetes mellitus with other specified complication: Secondary | ICD-10-CM | POA: Diagnosis present

## 2022-05-09 LAB — GLUCOSE, CAPILLARY
Glucose-Capillary: 204 mg/dL — ABNORMAL HIGH (ref 70–99)
Glucose-Capillary: 149 mg/dL — ABNORMAL HIGH (ref 70–99)

## 2022-05-09 NOTE — Progress Notes (Signed)
ERIBERTO, FELCH ([id number]) Visit Report for 05/09/2022 Arrival Information Details Patient Name: Gene Brown, Gene Brown Date of Service: 05/09/2022 8:00 AM Medical Record Number: [id number] Patient Account Number: [id number] Date of Birth/Sex: [birth year] (71 y.o. M) Treating RN: Cornell Barman Primary Care Twanda Stakes: Tracie Harrier Other Clinician: Jacqulyn Bath Referring Maddon Horton: Tracie Harrier Treating Lylith Bebeau/Extender: Skipper Cliche in Treatment: 44 Visit Information History Since Last Visit Added or deleted any medications: No Patient Arrived: Ambulatory Any new allergies or adverse reactions: No Arrival Time: 07:55 Had a fall or experienced change in No Accompanied By: self activities of daily living that may affect Transfer Assistance: None risk of falls: Patient Identification Verified: Yes Signs or symptoms of abuse/neglect since last visito No Secondary Verification Process Completed: Yes Hospitalized since last visit: No Patient Requires Transmission-Based No Implantable device outside of the clinic excluding No Precautions: cellular tissue based products placed in the center Patient Has Alerts: Yes since last visit: Patient Alerts: Patient on Blood Has Dressing in Place as Prescribed: Yes Thinner Has Footwear/Offloading in Place as Prescribed: Yes 01/07/2022 [place]>220 on Left: Surgical Shoe with Pressure left Relief Insole Pain Present Now: No Electronic Signature(s) Signed: 05/09/2022 11:10:57 AM By: Enedina Finner RCP, RRT, CHT Entered By: Enedina Finner on 05/09/2022 09:47:27 Gene Brown ([id number]) -------------------------------------------------------------------------------- Encounter Discharge Information Details Patient Name: Gene Brown Date of Service: 05/09/2022 8:00 AM Medical Record Number: [id number] Patient Account Number: [id number] Date of Birth/Sex: 10-23-1950 (71 y.o. M) Treating RN: Cornell Barman Primary Care  Lettie Czarnecki: Tracie Harrier Other Clinician: Jacqulyn Bath Referring Stephanie Littman: Tracie Harrier Treating Sary Bogie/Extender: Skipper Cliche in Treatment: 17 Encounter Discharge Information Items Discharge Condition: Stable Ambulatory Status: Ambulatory Discharge Destination: Home Transportation: Private Auto Accompanied By: self Schedule Follow-up Appointment: Yes Clinical Summary of Care: Notes Patient has an HBO treatment scheduled on 05/10/22 at 08:00 am. Electronic Signature(s) Signed: 05/09/2022 11:10:57 AM By: Enedina Finner RCP, RRT, CHT Entered By: Enedina Finner on 05/09/2022 11:09:34 Gene Brown ([id number]) -------------------------------------------------------------------------------- Vitals Details Patient Name: Gene Brown Date of Service: 05/09/2022 8:00 AM Medical Record Number: [id number] Patient Account Number: [id number] Date of Birth/Sex: Oct 05, 1950 (71 y.o. M) Treating RN: Cornell Barman Primary Care Aaron Boeh: Tracie Harrier Other Clinician: Jacqulyn Bath Referring Rayanna Matusik: Tracie Harrier Treating Lindyn Vossler/Extender: Skipper Cliche in Treatment: 17 Vital Signs Time Taken: 08:15 Temperature (F): 97.9 Height (in): 68 Pulse (bpm): 72 Weight (lbs): 217 Respiratory Rate (breaths/min): 18 Body Mass Index (BMI): 33 Blood Pressure (mmHg): 120/70 Capillary Blood Glucose (mg/dl): 204 Reference Range: 80 - 120 mg / dl Electronic Signature(s) Signed: 05/09/2022 11:10:57 AM By: Enedina Finner RCP, RRT, CHT Entered By: Enedina Finner on 05/09/2022 09:48:47

## 2022-05-10 ENCOUNTER — Encounter: Payer: Medicare Other | Admitting: Physician Assistant

## 2022-05-10 DIAGNOSIS — E1169 Type 2 diabetes mellitus with other specified complication: Secondary | ICD-10-CM | POA: Diagnosis not present

## 2022-05-10 LAB — GLUCOSE, CAPILLARY
Glucose-Capillary: 276 mg/dL — ABNORMAL HIGH (ref 70–99)
Glucose-Capillary: 127 mg/dL — ABNORMAL HIGH (ref 70–99)

## 2022-05-10 NOTE — Progress Notes (Signed)
Gene Brown ([id number]) Visit Report for 05/10/2022 Arrival Information Details Patient Name: Gene Brown Date of Service: 05/10/2022 8:00 AM Medical Record Number: [id number] Patient Account Number: [id number] Date of Birth/Sex: 1951/01/21 (71 y.o. M) Treating RN: Cornell Barman Primary Care Burnice Vassel: Tracie Harrier Other Clinician: Jacqulyn Bath Referring Tawny Raspberry: Tracie Harrier Treating Shayann Garbutt/Extender: Skipper Cliche in Treatment: 15 Visit Information History Since Last Visit Added or deleted any medications: No Patient Arrived: Cane Any new allergies or adverse reactions: No Arrival Time: 08:10 Had a fall or experienced change in No Accompanied By: self activities of daily living that may affect Transfer Assistance: None risk of falls: Patient Identification Verified: Yes Signs or symptoms of abuse/neglect since last visito No Secondary Verification Process Completed: Yes Hospitalized since last visit: No Patient Requires Transmission-Based No Implantable device outside of the clinic excluding No Precautions: cellular tissue based products placed in the center Patient Has Alerts: Yes since last visit: Patient Alerts: Patient on Blood Has Dressing in Place as Prescribed: Yes Thinner Has Footwear/Offloading in Place as Prescribed: Yes 01/07/2022 [place]>220 on Left: Surgical Shoe with Pressure left Relief Insole Pain Present Now: No Electronic Signature(s) Signed: 05/10/2022 1:17:21 PM By: Enedina Finner RCP, RRT, CHT Entered By: Enedina Finner on 05/10/2022 11:25:49 Gene Brown ([id number]) -------------------------------------------------------------------------------- Encounter Discharge Information Details Patient Name: Gene Brown Date of Service: 05/10/2022 8:00 AM Medical Record Number: [id number] Patient Account Number: [id number] Date of Birth/Sex: April 16, 1951 (71 y.o. M) Treating RN: Cornell Barman Primary Care Brinlynn Gorton:  Tracie Harrier Other Clinician: Jacqulyn Bath Referring Jesslyn Viglione: Tracie Harrier Treating Kevin Space/Extender: Skipper Cliche in Treatment: 17 Encounter Discharge Information Items Discharge Condition: Stable Ambulatory Status: Cane Discharge Destination: Home Transportation: Private Auto Accompanied By: self Schedule Follow-up Appointment: Yes Clinical Summary of Care: Notes Patient has an HBO treatment scheduled on 05/11/22 at 08:00 am. Electronic Signature(s) Signed: 05/10/2022 1:17:21 PM By: Enedina Finner RCP, RRT, CHT Entered By: Enedina Finner on 05/10/2022 11:25:55 Gene Brown ([id number]) -------------------------------------------------------------------------------- Vitals Details Patient Name: Gene Brown Date of Service: 05/10/2022 8:00 AM Medical Record Number: [id number] Patient Account Number: [id number] Date of Birth/Sex: [birth year] (71 y.o. M) Treating RN: Cornell Barman Primary Care Akira Perusse: Tracie Harrier Other Clinician: Jacqulyn Bath Referring Ebubechukwu Jedlicka: Tracie Harrier Treating Shayma Pfefferle/Extender: Skipper Cliche in Treatment: 17 Vital Signs Time Taken: 08:27 Temperature (F): 97.8 Height (in): 68 Pulse (bpm): 88 Weight (lbs): 217 Respiratory Rate (breaths/min): 18 Body Mass Index (BMI): 33 Blood Pressure (mmHg): 132/70 Capillary Blood Glucose (mg/dl): 276 Reference Range: 80 - 120 mg / dl Electronic Signature(s) Signed: 05/10/2022 1:17:21 PM By: Enedina Finner RCP, RRT, CHT Entered By: Enedina Finner on 05/10/2022 09:12:12

## 2022-05-10 NOTE — Progress Notes (Signed)
ATZEL, MCCAMBRIDGE ([id number]) Visit Report for 05/10/2022 HBO Details Patient Name: Gene Brown, Gene Brown Date of Service: 05/10/2022 8:00 AM Medical Record Number: [id number] Patient Account Number: [id number] Date of Birth/Sex: [birth year] (71 y.o. 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 HBO Treatment Details Treatment Number: 7 Patient Type: Outpatient Chamber Type: Monoplace Chamber Serial #: [id number] Treatment Protocol: 2.0 ATA with 90 minutes oxygen, and no air breaks Treatment Details Compression Rate Down: 1.5 psi / minute De-Compression Rate Up: 1.5 psi / minute Compress Tx Pressure Air breaks and breathing periods Decompress Decompress Begins Reached (leave unused spaces blank) Begins Ends Chamber Pressure (ATA) 1 2 - - - - - - 2 1 Clock Time (24 hr) 08:35 08:47 - - - - - - 10:17 10:27 Treatment Length: 112 (minutes) Treatment Segments: 4 Vital Signs Capillary Blood Glucose Reference Range: 80 - 120 mg / dl HBO Diabetic Blood Glucose Intervention Range: <131 mg/dl or >249 mg/dl Time Vitals Blood Respiratory Capillary Blood Glucose Pulse Action Type: Pulse: Temperature: Taken: Pressure: Rate: Glucose (mg/dl): Meter #: Oximetry (%) Taken: Pre 08:27 132/70 88 18 97.8 276 1 none per protocol Post 10:47 128/86 72 16 98.2 127 1 none per protocol Treatment Response Treatment Toleration: Well Treatment Completion Treatment Completed without Adverse Event Status: HBO Attestation I certify that I supervised this HBO treatment in accordance with Medicare guidelines. A trained emergency response team  is readily Yes available per hospital policies and procedures. Continue HBOT as ordered. Yes Electronic Signature(s) Signed: 05/10/2022 4:31:42 PM By: Worthy Keeler PA-C Previous Signature: 05/10/2022 1:17:21 PM Version By: Enedina Finner RCP, RRT, CHT Entered By: Worthy Keeler on 05/10/2022 16:31:42 Gene Brown ([id number]) -------------------------------------------------------------------------------- HBO Safety Checklist Details Patient Name: Gene Brown Date of Service: 05/10/2022 8:00 AM Medical Record Number: [id number] Patient Account Number: [id number] Date of Birth/Sex: [birth year] (71 y.o. M) Treating RN: Cornell Barman Primary Care Nanie Dunkleberger: Tracie Harrier Other Clinician: Jacqulyn Bath Referring Breawna Montenegro: Tracie Harrier Treating Reinhardt Licausi/Extender: Skipper Cliche in Treatment: 17 HBO Safety Checklist Items Safety Checklist Consent Form Signed Patient voided / foley secured and emptied When did you last eato 07:00 am Last dose of injectable or oral agent 07:00 am Ostomy pouch emptied and vented if applicable NA All implantable devices assessed, documented and approved NA Intravenous access site secured and place NA Valuables secured Linens and cotton and cotton/polyester blend (less than 51% polyester) Personal oil-based products / skin lotions / body lotions removed Wigs or hairpieces removed NA Smoking or tobacco materials removed NA Books / newspapers / magazines / loose paper removed NA Cologne, aftershave, perfume and deodorant removed Jewelry removed (may wrap wedding band) Make-up removed NA Hair care products removed Battery operated devices (external) removed NA Heating patches and chemical warmers removed NA Titanium eyewear removed NA Nail polish cured greater than 10 hours NA Casting material cured greater than 10 hours NA Hearing aids removed NA Loose dentures or partials removed NA Prosthetics have been  removed NA Patient demonstrates correct use of air break device (if applicable) Patient concerns have been addressed Patient grounding bracelet on and cord attached to chamber Specifics for Inpatients (complete in addition to above) Medication sheet sent with patient Intravenous medications  needed or due during therapy sent with patient Drainage tubes (e.g. nasogastric tube or chest tube secured and vented) Endotracheal or Tracheotomy tube secured Cuff deflated of air and inflated with saline Airway suctioned Electronic Signature(s) Signed: 05/10/2022 1:17:21 PM By: Enedina Finner RCP, RRT, CHT Entered By: Enedina Finner on 05/10/2022 09:13:18

## 2022-05-10 NOTE — Progress Notes (Signed)
NOA, CONSTANTE ([id number]) Visit Report for 05/09/2022 HBO Details Patient Name: Gene Brown, Gene Brown Date of Service: 05/09/2022 8:00 AM Medical Record Number: [id number] Patient Account Number: [id number] Date of Birth/Sex: Mar 03, 1951 (71 y.o. 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 HBO Treatment Details Treatment Number: 6 Patient Type: Outpatient Chamber Type: Monoplace Chamber Serial #: [id number] Treatment Protocol: 2.0 ATA with 90 minutes oxygen, and no air breaks Treatment Details Compression Rate Down: 1.5 psi / minute De-Compression Rate Up: 1.5 psi / minute Compress Tx Pressure Air breaks and breathing periods Decompress Decompress Begins Reached (leave unused spaces blank) Begins Ends Chamber Pressure (ATA) 1 2 - - - - - - 2 1 Clock Time (24 hr) 08:27 08:39 - - - - - - 10:10 10:20 Treatment Length: 113 (minutes) Treatment Segments: 4 Vital Signs Capillary Blood Glucose Reference Range: 80 - 120 mg / dl HBO Diabetic Blood Glucose Intervention Range: <131 mg/dl or >249 mg/dl Time Vitals Blood Respiratory Capillary Blood Glucose Pulse Action Type: Pulse: Temperature: Taken: Pressure: Rate: Glucose (mg/dl): Meter #: Oximetry (%) Taken: Pre 08:15 120/70 72 18 97.9 204 1 none per protocol Post 10:44 130/78 72 18 97.8 149 1 none per protocol Treatment Response Treatment Toleration: Well Treatment Completion Treatment Completed without Adverse Event Status: Electronic Signature(s) Signed: 05/09/2022 11:10:57 AM By: Enedina Finner RCP, RRT, CHT Signed: 05/09/2022 5:13:16 PM By: Worthy Keeler PA-C Entered By: Enedina Finner on 05/09/2022 11:08:45 Gene Brown ([id number]) -------------------------------------------------------------------------------- HBO Safety Checklist Details Patient Name: Gene Brown Date of Service: 05/09/2022 8:00 AM Medical Record Number: [id number] Patient Account Number: [id number] Date of Birth/Sex: 06-18-1951 (71 y.o. M) Treating RN: Cornell Barman Primary Care Petrice Beedy: Tracie Harrier Other Clinician: Jacqulyn Bath Referring Shermeka Rutt: Tracie Harrier Treating Yahir Tavano/Extender: Skipper Cliche in Treatment: 17 HBO Safety Checklist Items Safety Checklist Consent Form Signed Patient voided / foley secured and emptied When did you last eato 07:00 am Last dose of injectable or oral agent 07:00 am Ostomy pouch emptied and vented if applicable NA All implantable devices assessed, documented and approved NA Intravenous access site secured and place NA Valuables secured Linens and cotton and cotton/polyester blend (less than 51% polyester) Personal oil-based products / skin lotions / body lotions removed Wigs or hairpieces removed NA Smoking or tobacco materials removed NA Books / newspapers / magazines / loose paper removed NA Cologne, aftershave, perfume and deodorant removed Jewelry removed (may wrap wedding band) Make-up removed NA Hair care products removed Battery operated devices (external) removed NA Heating patches and chemical warmers removed NA Titanium eyewear removed NA Nail polish cured greater than 10 hours NA Casting material cured greater than 10 hours NA Hearing aids removed NA Loose dentures or partials removed NA Prosthetics have been removed NA Patient demonstrates correct use of air break device (if applicable) Patient concerns have been addressed Patient grounding bracelet on and cord attached to chamber Specifics for Inpatients (complete in addition  to above) Medication sheet sent with patient Intravenous medications needed or due during therapy sent with patient Drainage tubes (e.g. nasogastric tube or chest tube secured and vented) Endotracheal or Tracheotomy tube secured Cuff deflated of air and inflated with saline Airway suctioned Electronic Signature(s)  Signed: 05/09/2022 11:10:57 AM By: Aleda Grana RCP, RRT, CHT Entered By: Aleda Grana on 05/09/2022 09:49:40

## 2022-05-10 NOTE — Progress Notes (Signed)
KASTEN, LEVEQUE ([id number]) Visit Report for 05/10/2022 Problem List Details Patient Name: Gene Brown, Gene Brown Date of Service: 05/10/2022 8:00 AM Medical Record Number: [id number] Patient Account Number: [id number] Date of Birth/Sex: 10-14-1950 (71 y.o. 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o. 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.42 Type 2 diabetes mellitus with diabetic polyneuropathy I10 Essential (primary)  hypertension Facility Procedures CPT4 Code: 44315400 Description: (Facility Use Only) HBOT, full body chamber, 70min Modifier: Quantity: 4 Physician Procedures CPT4 Code: 8676195 Description: 09326 - WC PHYS HYPERBARIC OXYGEN THERAPY Modifier: Quantity: 1 CPT4 Code: Description: ICD-10 Diagnosis Description M86.672 Other chronic osteomyelitis, 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

## 2022-05-10 NOTE — Progress Notes (Signed)
Gene Brown, Gene Brown ([id number]) Visit Report for 05/10/2022 Chief Complaint Document Details Patient Name: Gene Brown, Gene Brown Date of Service: 05/10/2022 10:45 AM Medical Record Number: [id number] Patient Account Number: [id number] Date of Birth/Sex: 1950-12-03 (71 y.o. M) Treating RN: Cornell Barman Primary Care Provider: Tracie Harrier Other Clinician: Referring Provider: Tracie Harrier Treating Provider/Extender: Skipper Cliche in Treatment: 17 Information Obtained from: Patient Chief Complaint Left great toe ulcer Electronic Signature(s) Signed: 05/10/2022 11:21:24 AM By: Worthy Keeler PA-C Entered By: Worthy Keeler on 05/10/2022 11:21:24 Gene Brown ([id number]) -------------------------------------------------------------------------------- Problem List Details Patient Name: Gene Brown Date of Service: 05/10/2022 10:45 AM Medical Record Number: [id number] Patient Account Number: [id number] Date of Birth/Sex: 1951/01/28 (71 y.o. 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

## 2022-05-11 ENCOUNTER — Encounter (HOSPITAL_BASED_OUTPATIENT_CLINIC_OR_DEPARTMENT_OTHER): Payer: Medicare Other | Admitting: Internal Medicine

## 2022-05-11 DIAGNOSIS — E1169 Type 2 diabetes mellitus with other specified complication: Secondary | ICD-10-CM | POA: Diagnosis not present

## 2022-05-11 DIAGNOSIS — E11621 Type 2 diabetes mellitus with foot ulcer: Secondary | ICD-10-CM | POA: Diagnosis not present

## 2022-05-11 DIAGNOSIS — M86672 Other chronic osteomyelitis, left ankle and foot: Secondary | ICD-10-CM | POA: Diagnosis not present

## 2022-05-11 DIAGNOSIS — L97522 Non-pressure chronic ulcer of other part of left foot with fat layer exposed: Secondary | ICD-10-CM | POA: Diagnosis not present

## 2022-05-11 LAB — GLUCOSE, CAPILLARY
Glucose-Capillary: 131 mg/dL — ABNORMAL HIGH (ref 70–99)
Glucose-Capillary: 117 mg/dL — ABNORMAL HIGH (ref 70–99)

## 2022-05-11 NOTE — Progress Notes (Signed)
MORGEN, RITACCO ([id number]) Visit Report for 05/11/2022 HBO Details Patient Name: Gene Brown, Gene Brown Date of Service: 05/11/2022 8:00 AM Medical Record Number: [id number] Patient Account Number: [id number] Date of Birth/Sex: 11-26-1950 (71 y.o. 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 HBO Treatment Details Treatment Number: 8 Patient Type: Outpatient Chamber Type: Monoplace Chamber Serial #: [id number] Treatment Protocol: 2.0 ATA with 90 minutes oxygen, and no air breaks Treatment Details Compression Rate Down: 1.5 psi / minute De-Compression Rate Up: 1.5 psi / minute Compress Tx Pressure Air breaks and breathing periods Decompress Decompress Begins Reached (leave unused spaces blank) Begins Ends Chamber Pressure (ATA) 1 2 - - - - - - 2 1 Clock Time (24 hr) 08:21 08:32 - - - - - - 10:03 10:23 Treatment Length: 122 (minutes) Treatment Segments: 4 Vital Signs Capillary Blood Glucose Reference Range: 80 - 120 mg / dl HBO Diabetic Blood Glucose Intervention Range: <131 mg/dl or >249 mg/dl Time Capillary Blood Glucose Pulse Blood Respiratory Action Type: Vitals Pulse: Temperature: Glucose Meter Oximetry Pressure: Rate: Taken: Taken: (mg/dl): #: (%) Gave Ensure and proceeded with Pre 08:10 132/80 72 18 99.5 131 1 treatment Post 10:38 130/82 66 18 97.8 117 1 none per protocol Treatment Response Treatment Toleration: Well Treatment Completion Treatment Completed without Adverse Event Status: Treatment Notes Patient tends to have significant blood glucose drops. Patient was given Ensure even though  his reading was 131. Kalman Shan, DO was made aware. HBO Attestation I certify that I supervised this HBO treatment in accordance with Medicare guidelines. A trained emergency response team is readily Yes available per hospital policies and procedures. Continue HBOT as ordered. Yes Electronic Signature(s) Signed: 05/11/2022 2:44:12 PM By: Kalman Shan DO Previous Signature: 05/11/2022 12:17:44 PM Version By: Enedina Finner RCP, RRT, CHT Entered By: Kalman Shan on 05/11/2022 14:42:24 Gene Brown ([id number]) -------------------------------------------------------------------------------- HBO Safety Checklist Details Patient Name: Gene Brown Date of Service: 05/11/2022 8:00 AM Medical Record Number: [id number] Patient Account Number: [id number] Date of Birth/Sex: 18-Aug-1950 (71 y.o. M) Treating RN: Cornell Barman Primary Care Luchiano Viscomi: Tracie Harrier Other Clinician: Jacqulyn Bath Referring Patterson Hollenbaugh: Tracie Harrier Treating Ismail Graziani/Extender: Yaakov Guthrie in Treatment: 17 HBO Safety Checklist Items Safety Checklist Consent Form Signed Patient voided / foley secured and emptied When did you last eato 07:00 am Last dose of injectable or oral agent 07:00 am Ostomy pouch emptied and vented if applicable NA All implantable devices assessed, documented and approved NA Intravenous access site secured and place NA Valuables secured Linens and cotton and cotton/polyester blend (less than 51% polyester) Personal oil-based products / skin lotions / body lotions removed Wigs or hairpieces removed NA Smoking or tobacco materials removed NA Books / newspapers / magazines / loose paper removed NA Cologne, aftershave, perfume and deodorant removed Jewelry removed (may wrap wedding band) Make-up removed NA Hair care products removed Battery operated devices (external) removed NA Heating patches and chemical warmers removed NA Titanium  eyewear removed NA Nail polish cured greater than 10 hours NA Casting material cured greater than 10 hours NA Hearing aids removed NA Loose dentures or partials removed NA Prosthetics have been removed NA Patient demonstrates correct use of air break device (if applicable) Patient concerns  have been addressed Patient grounding bracelet on and cord attached to chamber Specifics for Inpatients (complete in addition to above) Medication sheet sent with patient Intravenous medications needed or due during therapy sent with patient Drainage tubes (e.g. nasogastric tube or chest tube secured and vented) Endotracheal or Tracheotomy tube secured Cuff deflated of air and inflated with saline Airway suctioned Electronic Signature(s) Signed: 05/11/2022 12:17:44 PM By: Aleda Grana RCP, RRT, CHT Entered By: Drake Leach, Lucio Edward on 05/11/2022 08:42:58

## 2022-05-11 NOTE — Progress Notes (Signed)
KHYAN, OATS ([id number]) Visit Report for 05/11/2022 Arrival Information Details Patient Name: Gene Brown, Gene Brown Date of Service: 05/11/2022 8:00 AM Medical Record Number: [id number] Patient Account Number: [id number] Date of Birth/Sex: 01-Feb-1951 (71 y.o. M) Treating RN: Cornell Barman Primary Care Christianjames Soule: Tracie Harrier Other Clinician: Jacqulyn Bath Referring Jalan Fariss: Tracie Harrier Treating Mora Pedraza/Extender: Yaakov Guthrie in Treatment: 42 Visit Information History Since Last Visit Added or deleted any medications: No Patient Arrived: Cane Any new allergies or adverse reactions: No Arrival Time: 07:50 Had a fall or experienced change in No Accompanied By: self activities of daily living that may affect Transfer Assistance: None risk of falls: Patient Identification Verified: Yes Signs or symptoms of abuse/neglect since last visito No Secondary Verification Process Completed: Yes Hospitalized since last visit: No Patient Requires Transmission-Based No Implantable device outside of the clinic excluding No Precautions: cellular tissue based products placed in the center Patient Has Alerts: Yes since last visit: Patient Alerts: Patient on Blood Has Dressing in Place as Prescribed: Yes Thinner Has Footwear/Offloading in Place as Prescribed: Yes 01/07/2022 [place]>220 on Left: Surgical Shoe with Pressure left Relief Insole Pain Present Now: No Electronic Signature(s) Signed: 05/11/2022 12:17:44 PM By: Enedina Finner RCP, RRT, CHT Entered By: Enedina Finner on 05/11/2022 08:40:04 Gene Brown ([id number]) -------------------------------------------------------------------------------- Encounter Discharge Information Details Patient Name: Gene Brown Date of Service: 05/11/2022 8:00 AM Medical Record Number: [id number] Patient Account Number: [id number] Date of Birth/Sex: [birth year] (71 y.o. M) Treating RN: Cornell Barman Primary Care  Merary Garguilo: Tracie Harrier Other Clinician: Jacqulyn Bath Referring Vianny Schraeder: Tracie Harrier Treating Ethelda Deangelo/Extender: Yaakov Guthrie in Treatment: 17 Encounter Discharge Information Items Discharge Condition: Stable Ambulatory Status: Cane Discharge Destination: Home Transportation: Private Auto Accompanied By: self Schedule Follow-up Appointment: Yes Clinical Summary of Care: Notes Patient has an HBO treatment scheduled on 05/12/22 at 08:00 am Electronic Signature(s) Signed: 05/12/2022 1:17:45 PM By: Enedina Finner RCP, RRT, CHT Previous Signature: 05/11/2022 12:17:44 PM Version By: Enedina Finner RCP, RRT, CHT Previous Signature: 05/11/2022 12:16:51 PM Version By: Enedina Finner RCP, RRT, CHT Entered By: Enedina Finner on 05/11/2022 13:14:04 Gene Brown ([id number]) -------------------------------------------------------------------------------- Vitals Details Patient Name: Gene Brown Date of Service: 05/11/2022 8:00 AM Medical Record Number: [id number] Patient Account Number: [id number] Date of Birth/Sex: [birth year] (71 y.o. M) Treating RN: Cornell Barman Primary Care Jiyaan Steinhauser: Tracie Harrier Other Clinician: Jacqulyn Bath Referring Melyna Huron: Tracie Harrier Treating Darice Vicario/Extender: Yaakov Guthrie in Treatment: 17 Vital Signs Time Taken: 08:10 Temperature (F): 99.5 Height (in): 68 Pulse (bpm): 72 Weight (lbs): 217 Respiratory Rate (breaths/min): 18 Body Mass Index (BMI): 33 Blood Pressure (mmHg): 132/80 Capillary Blood Glucose (mg/dl): 131 Reference Range: 80 - 120 mg / dl Electronic Signature(s) Signed: 05/11/2022 12:17:44 PM By: Enedina Finner RCP, RRT, CHT Entered By: Enedina Finner on 05/11/2022 08:40:56

## 2022-05-11 NOTE — Progress Notes (Signed)
Gene Brown, Gene Brown ([id number]) Visit Report for 05/10/2022 Arrival Information Details Patient Name: Gene Brown, Gene Brown Date of Service: 05/10/2022 10:45 AM Medical Record Number: [id number] Patient Account Number: [id number] Date of Birth/Sex: 1950/08/14 (71 y.o. M) Treating RN: Huel Coventry Primary Care Panfilo Ketchum: Barbette Reichmann Other Clinician: Referring Ariadne Rissmiller: Barbette Reichmann Treating Mikaya Bunner/Extender: Rowan Blase in Treatment: 17 Visit Information History Since Last Visit Pain Present Now: No Patient Arrived: Cane Arrival Time: 11:08 Accompanied By: self Transfer Assistance: None Patient Identification Verified: Yes Secondary Verification Process Completed: Yes Patient Requires Transmission-Based No Precautions: Patient Has Alerts: Yes Patient Alerts: Patient on Blood Thinner 01/07/2022 [place]>220 on left Electronic Signature(s) Signed: 05/11/2022 9:40:21 AM By: Elliot Gurney, BSN, RN, CWS, Kim RN, BSN Entered By: Elliot Gurney, BSN, RN, CWS, Kim on 05/10/2022 11:12:20 Gene Brown ([id number]) -------------------------------------------------------------------------------- Encounter Discharge Information Details Patient Name: Gene Brown Date of Service: 05/10/2022 10:45 AM Medical Record Number: [id number] Patient Account Number: [id number] Date of Birth/Sex: 16-May-1951 (71 y.o. M) Treating RN: Huel Coventry Primary Care Reyan Helle: Barbette Reichmann Other Clinician: Referring Shea Swalley: Barbette Reichmann Treating Lesly Pontarelli/Extender: Rowan Blase in Treatment: 17 Encounter Discharge Information Items Post Procedure Vitals Discharge Condition: Stable Temperature (F): 97.8 Ambulatory Status: Ambulatory Pulse (bpm): 68 Discharge Destination: Home Respiratory Rate (breaths/min): 16 Transportation: Private Auto Blood Pressure (mmHg): 144/75 Accompanied By: self Schedule Follow-up Appointment: No Clinical Summary of Care: Electronic Signature(s) Signed: 05/11/2022 9:40:21 AM  By: Elliot Gurney, BSN, RN, CWS, Kim RN, BSN Entered By: Elliot Gurney, BSN, RN, CWS, Kim on 05/10/2022 11:46:00 Gene Brown ([id number]) -------------------------------------------------------------------------------- Lower Extremity Assessment Details Patient Name: Gene Brown Date of Service: 05/10/2022 10:45 AM Medical Record Number: [id number] Patient Account Number: [id number] Date of Birth/Sex: [birth year] (71 y.o. M) Treating RN: Huel Coventry Primary Care Amelianna Meller: Barbette Reichmann Other Clinician: Referring Ardel Jagger: Barbette Reichmann Treating Saphronia Ozdemir/Extender: Rowan Blase in Treatment: 17 Edema Assessment Assessed: [Left: Yes] [Right: No] Edema: [Left: Ye] [Right: s] Calf Left: Right: Point of Measurement: 28 cm From Medial Instep 46.2 cm Ankle Left: Right: Point of Measurement: 11 cm From Medial Instep 29.8 cm Vascular Assessment Pulses: Dorsalis Pedis Palpable: [Left:Yes] Electronic Signature(s) Signed: 05/11/2022 9:40:21 AM By: Elliot Gurney, BSN, RN, CWS, Kim RN, BSN Entered By: Elliot Gurney, BSN, RN, CWS, Kim on 05/10/2022 11:19:48 Gene Brown ([id number]) -------------------------------------------------------------------------------- Multi Wound Chart Details Patient Name: Gene Brown Date of Service: 05/10/2022 10:45 AM Medical Record Number: [id number] Patient Account Number: [id number] Date of Birth/Sex: Feb 17, 1951 (71 y.o. 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 N/A Extremity Extremity Comorbid History: Cataracts, Sickle Cell Disease, Sleep Cataracts, Sickle Cell Disease, Sleep N/A Apnea, Hypertension, Type II Apnea, Hypertension, Type II Diabetes, Gout, Osteoarthritis Diabetes, Gout, Osteoarthritis Date Acquired: 12/09/2021 03/25/2022 N/A Weeks of Treatment: 17 2 N/A Wound Status: Open Open N/A Wound Recurrence: No No N/A Measurements L x W x D (cm) 0.2x0.3x0.3 0.4x0.3x0.4 N/A Area (cm) : 0.047 0.094 N/A Volume (cm) : 0.014 0.038 N/A % Reduction in Area: 83.40% 0.00% N/A % Reduction in Volume: 90.10% 49.30% N/A Classification: Grade 3 Grade 3 N/A Exudate Amount: Medium Medium N/A Exudate Type: Serosanguineous  Serosanguineous N/A Exudate Color: red, brown red, brown N/A Wound Margin: Flat and Intact Distinct, outline attached N/A Granulation Amount: Large (67-100%) Large (67-100%) N/A Granulation Quality: Pink, Pale Red N/A Necrotic Amount: Small (1-33%) Small (1-33%) N/A Exposed Structures: Fat Layer (Subcutaneous Tissue): Fat Layer (Subcutaneous Tissue): N/A Yes Yes Fascia: No Tendon: No Muscle: No Joint: No Bone: No Epithelialization: None None N/A Treatment Notes Electronic Signature(s) Signed: 05/11/2022 9:40:21 AM By: [name], BSN, RN, CWS, Kim RN, BSN Entered By: [name], BSN, RN, CWS, Kim on 05/10/2022 11:41:57 Gene Brown ([id number]) EMMETTE, KATT ([id number]) -------------------------------------------------------------------------------- Multi-Disciplinary Care Plan Details Patient Name: Gene Brown Date of Service: 05/10/2022 10:45 AM Medical Record Number: [id number] Patient Account Number: [id number] Date of Birth/Sex: 1951/01/06 (71 y.o. M) Treating RN: Cornell Barman Primary Care Ashli Selders: Tracie Harrier Other Clinician: Referring Genell Thede: Tracie Harrier Treating Chiquetta Langner/Extender: Skipper Cliche in Treatment: 17 Active Inactive HBO Nursing  Diagnoses: Anxiety related to feelings of confinement associated with the hyperbaric oxygen chamber Anxiety related to knowledge deficit of hyperbaric oxygen therapy and treatment procedures Discomfort related to temperature and humidity changes inside hyperbaric chamber Potential for barotraumas to ears, sinuses, teeth, and lungs or cerebral gas embolism related to changes in atmospheric pressure inside hyperbaric oxygen chamber Potential for oxygen toxicity seizures related to delivery of 100% oxygen at an increased atmospheric pressure Potential for pulmonary oxygen toxicity related to delivery of 100% oxygen at an increased atmospheric pressure Goals: Barotrauma will be prevented during HBO2 Date Initiated: 05/10/2022 Target Resolution Date: 05/02/2022 Goal Status: Active Patient and/or family will be able to state/discuss factors appropriate to the management of their disease process during treatment Date Initiated: 05/10/2022 Target Resolution Date: 05/02/2022 Goal Status: Active Patient will tolerate the hyperbaric oxygen therapy treatment Date Initiated: 05/10/2022 Target Resolution Date: 05/02/2022 Goal Status: Active Patient will tolerate the internal climate of the chamber Date Initiated: 05/10/2022 Target Resolution Date: 05/02/2022 Goal Status: Active Patient/caregiver will verbalize understanding of HBO goals, rationale, procedures and potential hazards Date Initiated: 05/10/2022 Target Resolution Date: 05/02/2022 Goal Status: Active Signs and symptoms of pulmonary oxygen toxicity will be recognized and promptly addressed Date Initiated: 05/10/2022 Target Resolution Date: 05/02/2022 Goal Status: Active Signs and symptoms of seizure will be recognized and promptly addressed ; seizing patients will suffer no harm Date Initiated: 05/10/2022 Target Resolution Date: 05/02/2022 Goal Status: Active Interventions: Administer a five (5) minute air break for patient if signs and symptoms  of seizure appear and notify the hyperbaric physician Administer decongestants, per physician orders, prior to HBO2 Administer the correct therapeutic gas delivery based on the patients needs and limitations, per physician order Assess and provide for patientos comfort related to the hyperbaric environment and equalization of middle ear Assess for signs and symptoms related to adverse events, including but not limited to confinement anxiety, pneumothorax, oxygen toxicity and baurotrauma Assess patient for any history of confinement anxiety Assess patient's knowledge and expectations regarding hyperbaric medicine and provide education related to the hyperbaric environment, goals of treatment and prevention of adverse events Implement protocols to decrease risk of pneumothorax in high risk patients Notes: Electronic Signature(s) Signed: 05/11/2022 9:40:21 AM By: [name], BSN, RN, CWS, Kim RN, BSN Entered By: [name], BSN, RN, CWS, Kim on 05/10/2022 11:23:41 Gene Brown ([id number]) Gene Brown, Gene Brown ([id number]) -------------------------------------------------------------------------------- Pain Assessment Details Patient Name: Gene Brown Date of Service: 05/10/2022 10:45 AM Medical Record Number: [id number] Patient Account Number: [id number] Date of Birth/Sex: 05-18-1951 (71 y.o. M) Treating RN: Cornell Barman Primary Care  Beckem Tomberlin: Barbette Reichmann Other Clinician: Referring Jacen Carlini: Barbette Reichmann Treating Dyonna Jaspers/Extender: Rowan Blase in Treatment: 17 Active Problems Location of Pain Severity and Description of Pain Patient Has Paino No Site Locations Pain Management and Medication Current Pain Management: Electronic Signature(s) Signed: 05/11/2022 9:40:21 AM By: Elliot Gurney, BSN, RN, CWS, Kim RN, BSN Entered By: Elliot Gurney, BSN, RN, CWS, Kim on 05/10/2022 11:12:44 Gene Brown  ([id number]) -------------------------------------------------------------------------------- Patient/Caregiver Education Details Patient Name: Gene Brown Date of Service: 05/10/2022 10:45 AM Medical Record Number: [id number] Patient Account Number: [id number] Date of Birth/Gender: Nov 30, 1950 (71 y.o. M) Treating RN: Huel Coventry Primary Care Physician: Barbette Reichmann Other Clinician: Referring Physician: Barbette Reichmann Treating Physician/Extender: Rowan Blase in Treatment: 17 Education Assessment Education Provided To: Patient Education Topics Provided Wound Debridement: Handouts: Wound Debridement Methods: Demonstration, Explain/Verbal Responses: State content correctly Wound/Skin Impairment: Handouts: Caring for Your Ulcer Methods: Demonstration, Explain/Verbal Responses: State content correctly Electronic Signature(s) Signed: 05/11/2022 9:40:21 AM By: Elliot Gurney, BSN, RN, CWS, Kim RN, BSN Entered By: Elliot Gurney, BSN, RN, CWS, Kim on 05/10/2022 11:45:12 Gene Brown ([id number]) -------------------------------------------------------------------------------- Wound Assessment Details Patient Name: Gene Brown Date of Service: 05/10/2022 10:45 AM Medical Record Number: [id number] Patient Account Number: [id number] Date of Birth/Sex: [birth year] (71 y.o. M) Treating RN: Huel Coventry Primary Care Mersadies Petree: Barbette Reichmann Other Clinician: Referring Mikale Silversmith: Barbette Reichmann Treating Amylee Lodato/Extender: Rowan Blase in Treatment: 17 Wound Status Wound Number: 2 Primary Diabetic Wound/Ulcer of the Lower Extremity Etiology: Wound Location: Left, Plantar Toe Great Wound Open Wounding Event: Gradually Appeared Status: Date Acquired: 12/09/2021 Comorbid Cataracts, Sickle Cell Disease, Sleep Apnea, Weeks Of Treatment: 17 History: Hypertension, Type II Diabetes, Gout, Osteoarthritis Clustered Wound: No Photos Wound Measurements Length: (cm) 0.2 % Red Width: (cm)  0.3 % Red Depth: (cm) 0.3 Epith Area: (cm) 0.047 Tunn Volume: (cm) 0.014 Unde uction in Area: 83.4% uction in Volume: 90.1% elialization: None eling: No rmining: No Wound Description Classification: Grade 3 Foul Wound Margin: Flat and Intact Slou Exudate Amount: Medium Exudate Type: Serosanguineous Exudate Color: red, brown Odor After Cleansing: No gh/Fibrino Yes Wound Bed Granulation Amount: Large (67-100%) Exposed Structure Granulation Quality: Pink, Pale Fascia Exposed: No Necrotic Amount: Small (1-33%) Fat Layer (Subcutaneous Tissue) Exposed: Yes Necrotic Quality: Adherent Slough Tendon Exposed: No Muscle Exposed: No Joint Exposed: No Bone Exposed: No Treatment Notes Wound #2 (Toe Great) Wound Laterality: Plantar, Left Cleanser Peri-Wound Care Topical Gene Brown, Gene Brown ([id number]) Primary Dressing Prisma 4.34 (in) Discharge Instruction: Moisten w/normal saline or sterile water; Cover wound as directed. Do not remove from wound bed. Secondary Dressing Gauze Discharge Instruction: As directed: dry, moistened with saline or moistened with Dakins Solution Secured With Medipore Tape - 82M Medipore H Soft Cloth Surgical Tape, 2x2 (in/yd) Compression Wrap Compression Stockings Add-Ons Electronic Signature(s) Signed: 05/11/2022 9:40:21 AM By: Elliot Gurney, BSN, RN, CWS, Kim RN, BSN Entered By: Elliot Gurney, BSN, RN, CWS, Kim on 05/10/2022 11:27:48 Gene Brown ([id number]) -------------------------------------------------------------------------------- Wound Assessment Details Patient Name: Gene Brown Date of Service: 05/10/2022 10:45 AM Medical Record Number: [id number] Patient Account Number: [id number] Date of Birth/Sex: [birth year] (71 y.o. M) Treating RN: Huel Coventry Primary Care Pollyann Roa: Barbette Reichmann Other Clinician: Referring Sanskriti Greenlaw: Barbette Reichmann Treating Harris Kistler/Extender: Rowan Blase in Treatment: 17 Wound Status Wound Number: 3 Primary  Diabetic Wound/Ulcer of the Lower Extremity Etiology: Wound Location: Left, Dorsal Toe Great Wound Open Wounding Event: Pressure Injury Status: Date Acquired: 03/25/2022 Comorbid Cataracts, Sickle Cell Disease, Sleep Apnea, Weeks Of Treatment: 2 History: Hypertension, Type II Diabetes,  Gout, Osteoarthritis Clustered Wound: No Photos Wound Measurements Length: (cm) 0.4 Width: (cm) 0.3 Depth: (cm) 0.4 Area: (cm) 0.094 Volume: (cm) 0.038 % Reduction in Area: 0% % Reduction in Volume: 49.3% Epithelialization: None Tunneling: No Undermining: No Wound Description Classification: Grade 3 Wound Margin: Distinct, outline attached Exudate Amount: Medium Exudate Type: Serosanguineous Exudate Color: red, brown Foul Odor After Cleansing: No Slough/Fibrino Yes Wound Bed Granulation Amount: Large (67-100%) Exposed Structure Granulation Quality: Red Fat Layer (Subcutaneous Tissue) Exposed: Yes Necrotic Amount: Small (1-33%) Necrotic Quality: Adherent Slough Treatment Notes Wound #3 (Toe Great) Wound Laterality: Dorsal, Left Cleanser Peri-Wound Care Topical Primary Dressing Prisma 4.34 (in) Gene Brown, Gene Brown ([id number]) Discharge Instruction: Moisten w/normal saline or sterile water; Cover wound as directed. Do not remove from wound bed. Secondary Dressing Gauze Discharge Instruction: As directed: dry, moistened with saline or moistened with Dakins Solution Secured With Medipore Tape - 76M Medipore H Soft Cloth Surgical Tape, 2x2 (in/yd) Compression Wrap Compression Stockings Add-Ons Electronic Signature(s) Signed: 05/11/2022 9:40:21 AM By: Elliot Gurney, BSN, RN, CWS, Kim RN, BSN Entered By: Elliot Gurney, BSN, RN, CWS, Kim on 05/10/2022 11:29:25 Gene Brown ([id number]) -------------------------------------------------------------------------------- Vitals Details Patient Name: Gene Brown Date of Service: 05/10/2022 10:45 AM Medical Record Number: [id number] Patient Account  Number: [id number] Date of Birth/Sex: 31-Jan-1951 (71 y.o. M) Treating RN: Huel Coventry Primary Care Eduard Penkala: Barbette Reichmann Other Clinician: Referring Sarkis Rhines: Barbette Reichmann Treating Shanekqua Schaper/Extender: Rowan Blase in Treatment: 17 Vital Signs Time Taken: 11:30 Temperature (F): 97.8 Height (in): 68 Pulse (bpm): 88 Weight (lbs): 217 Respiratory Rate (breaths/min): 18 Body Mass Index (BMI): 33 Blood Pressure (mmHg): 132/70 Reference Range: 80 - 120 mg / dl Electronic Signature(s) Signed: 05/11/2022 9:40:21 AM By: Elliot Gurney, BSN, RN, CWS, Kim RN, BSN Entered By: Elliot Gurney, BSN, RN, CWS, Kim on 05/10/2022 11:41:44

## 2022-05-12 ENCOUNTER — Encounter: Payer: Medicare Other | Admitting: Physician Assistant

## 2022-05-12 DIAGNOSIS — E1169 Type 2 diabetes mellitus with other specified complication: Secondary | ICD-10-CM | POA: Diagnosis not present

## 2022-05-12 LAB — GLUCOSE, CAPILLARY
Glucose-Capillary: 164 mg/dL — ABNORMAL HIGH (ref 70–99)
Glucose-Capillary: 159 mg/dL — ABNORMAL HIGH (ref 70–99)

## 2022-05-12 NOTE — Progress Notes (Signed)
Gene Brown, Gene Brown ([id number]) Visit Report for 05/12/2022 HBO Details Patient Name: Gene Brown, Gene Brown Date of Service: 05/12/2022 8:00 AM Medical Record Number: [id number] Patient Account Number: [id number] Date of Birth/Sex: 10-15-1950 (71 y.o. 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 HBO Treatment Details Treatment Number: 9 Patient Type: Outpatient Chamber Type: Monoplace Chamber Serial #: [id number] Treatment Protocol: 2.0 ATA with 90 minutes oxygen, and no air breaks Treatment Details Compression Rate Down: 1.5 psi / minute De-Compression Rate Up: 1.5 psi / minute Compress Tx Pressure Air breaks and breathing periods Decompress Decompress Begins Reached (leave unused spaces blank) Begins Ends Chamber Pressure (ATA) 1 2 - - - - - - 2 1 Clock Time (24 hr) 08:25 08:37 - - - - - - 10:07 10:17 Treatment Length: 112 (minutes) Treatment Segments: 4 Vital Signs Capillary Blood Glucose Reference Range: 80 - 120 mg / dl HBO Diabetic Blood Glucose Intervention Range: <131 mg/dl or >249 mg/dl Time Vitals Blood Respiratory Capillary Blood Glucose Pulse Action Type: Pulse: Temperature: Taken: Pressure: Rate: Glucose (mg/dl): Meter #: Oximetry (%) Taken: Pre 08:06 110/70 60 16 98 164 1 none per protocol Post 10:37 122/68 60 18 97.8 159 1 none per protocol Treatment Response Treatment Toleration: Well Treatment Completion Treatment Completed without Adverse Event Status: Electronic Signature(s) Signed: 05/12/2022 1:17:45 PM By: Gene Brown RCP, RRT, CHT Signed: 05/12/2022 2:50:44 PM By: Gene Keeler PA-C Entered By: Gene Brown on 05/12/2022 11:15:10 Gene Brown ([id number]) -------------------------------------------------------------------------------- HBO Safety Checklist Details Patient Name: Gene Brown Date of Service: 05/12/2022 8:00 AM Medical Record Number: [id number] Patient Account Number: [id number] Date of Birth/Sex: 1951/05/04 (71 y.o. M) Treating RN: Gene Brown Primary Care [place]ale Nies: Gene Brown Other Clinician: [name] Referring [name]: Gene Brown Treating [name]/Extender: Gene Brown in Treatment: 17 HBO Safety Checklist Items Safety Checklist Consent Form Signed Patient voided / foley secured and emptied When did you last eato 07:00 am Last dose of injectable or oral agent 07:00 am Ostomy pouch emptied and vented if applicable NA All implantable devices assessed, documented and approved NA Intravenous access site secured and place NA Valuables secured Linens and cotton and cotton/polyester blend (less than 51% polyester) Personal oil-based products / skin lotions / body lotions removed Wigs or hairpieces removed NA Smoking or tobacco materials removed NA Books / newspapers / magazines / loose paper removed NA Cologne, aftershave, perfume and deodorant removed Jewelry removed (may wrap wedding band) Make-up removed NA Hair care products removed Battery operated devices (external) removed NA Heating patches and chemical warmers removed NA Titanium eyewear removed NA Nail polish cured greater than 10 hours NA Casting material cured greater than 10 hours NA Hearing aids removed NA Loose dentures or partials removed NA Prosthetics have been removed NA Patient demonstrates correct use of air break device (if applicable) Patient concerns have been addressed Patient grounding bracelet on and cord attached to chamber Specifics for Inpatients (complete in addition to above) Medication  sheet sent with patient Intravenous medications needed or due during therapy sent with patient Drainage tubes (e.g. nasogastric tube or chest tube secured and vented) Endotracheal or Tracheotomy tube secured Cuff deflated of air and inflated with saline Airway suctioned Electronic Signature(s)  Signed: 05/12/2022 1:17:45 PM By: Aleda Grana RCP, RRT, CHT Entered By: Aleda Grana on 05/12/2022 08:51:59

## 2022-05-12 NOTE — Progress Notes (Signed)
TAKUMA, CIFELLI ([id number]) Visit Report for 05/12/2022 Arrival Information Details Patient Name: Gene Brown, Gene Brown Date of Service: 05/12/2022 8:00 AM Medical Record Number: [id number] Patient Account Number: [id number] Date of Birth/Sex: [birth year] (71 y.o. M) Treating RN: Cornell Barman Primary Care Jazmyne Beauchesne: Tracie Harrier Other Clinician: Jacqulyn Bath Referring Jaclyn Andy: Tracie Harrier Treating Leniya Breit/Extender: Skipper Cliche in Treatment: 10 Visit Information History Since Last Visit Added or deleted any medications: No Patient Arrived: Cane Any new allergies or adverse reactions: No Arrival Time: 07:55 Had a fall or experienced change in No Accompanied By: self activities of daily living that may affect Transfer Assistance: None risk of falls: Patient Identification Verified: Yes Signs or symptoms of abuse/neglect since last visito No Secondary Verification Process Completed: Yes Hospitalized since last visit: No Patient Requires Transmission-Based No Implantable device outside of the clinic excluding No Precautions: cellular tissue based products placed in the center Patient Has Alerts: Yes since last visit: Patient Alerts: Patient on Blood Has Dressing in Place as Prescribed: Yes Thinner Has Footwear/Offloading in Place as Prescribed: Yes 01/07/2022 [place]>220 on Left: Surgical Shoe with Pressure left Relief Insole Pain Present Now: No Electronic Signature(s) Signed: 05/12/2022 1:17:45 PM By: Enedina Finner RCP, RRT, CHT Entered By: Enedina Finner on 05/12/2022 08:49:05 Gene Brown ([id number]) -------------------------------------------------------------------------------- Encounter Discharge Information Details Patient Name: Gene Brown Date of Service: 05/12/2022 8:00 AM Medical Record Number: [id number] Patient Account Number: [id number] Date of Birth/Sex: 1951-02-17 (71 y.o. M) Treating RN: Cornell Barman Primary Care Zephyr Ridley:  Tracie Harrier Other Clinician: Jacqulyn Bath Referring Annet Manukyan: Tracie Harrier Treating Lamonte Hartt/Extender: Skipper Cliche in Treatment: 17 Encounter Discharge Information Items Discharge Condition: Stable Ambulatory Status: Cane Discharge Destination: Home Transportation: Private Auto Accompanied By: self Schedule Follow-up Appointment: Yes Clinical Summary of Care: Notes Patient has an HBO treatment scheduled on 05/13/22 at 07:30 am. Electronic Signature(s) Signed: 05/12/2022 1:17:45 PM By: Enedina Finner RCP, RRT, CHT Entered By: Enedina Finner on 05/12/2022 12:04:45 Gene Brown ([id number]) -------------------------------------------------------------------------------- Vitals Details Patient Name: Gene Brown Date of Service: 05/12/2022 8:00 AM Medical Record Number: [id number] Patient Account Number: [id number] Date of Birth/Sex: 05-31-1951 (71 y.o. M) Treating RN: Cornell Barman Primary Care Elchonon Maxson: Tracie Harrier Other Clinician: Jacqulyn Bath Referring Jasmain Ahlberg: Tracie Harrier Treating Mane Consolo/Extender: Skipper Cliche in Treatment: 17 Vital Signs Time Taken: 08:06 Temperature (F): 98.0 Height (in): 68 Pulse (bpm): 60 Weight (lbs): 217 Respiratory Rate (breaths/min): 16 Body Mass Index (BMI): 33 Blood Pressure (mmHg): 110/70 Capillary Blood Glucose (mg/dl): 164 Reference Range: 80 - 120 mg / dl Electronic Signature(s) Signed: 05/12/2022 1:17:45 PM By: Enedina Finner RCP, RRT, CHT Entered By: Enedina Finner on 05/12/2022 08:50:37

## 2022-05-13 ENCOUNTER — Encounter: Payer: Medicare Other | Admitting: Physician Assistant

## 2022-05-13 DIAGNOSIS — E1169 Type 2 diabetes mellitus with other specified complication: Secondary | ICD-10-CM | POA: Diagnosis not present

## 2022-05-13 LAB — GLUCOSE, CAPILLARY
Glucose-Capillary: 159 mg/dL — ABNORMAL HIGH (ref 70–99)
Glucose-Capillary: 130 mg/dL — ABNORMAL HIGH (ref 70–99)

## 2022-05-13 NOTE — Progress Notes (Signed)
LEELYND, MALDONADO ([id number]) Visit Report for 05/13/2022 HBO Details Patient Name: Gene Brown, Gene Brown Date of Service: 05/13/2022 7:30 AM Medical Record Number: [id number] Patient Account Number: [id number] Date of Birth/Sex: 01/27/1951 (71 y.o. 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 HBO Treatment Details Treatment Number: 10 Patient Type: Outpatient Chamber Type: Monoplace Chamber Serial #: [id number] Treatment Protocol: 2.0 ATA with 90 minutes oxygen, and no air breaks Treatment Details Compression Rate Down: 1.5 psi / minute De-Compression Rate Up: 1.5 psi / minute Compress Tx Pressure Air breaks and breathing periods Decompress Decompress Begins Reached (leave unused spaces blank) Begins Ends Chamber Pressure (ATA) 1 2 - - - - - - 2 1 Clock Time (24 hr) 08:17 08:29 - - - - - - 09:59 10:09 Treatment Length: 112 (minutes) Treatment Segments: 4 Vital Signs Capillary Blood Glucose Reference Range: 80 - 120 mg / dl HBO Diabetic Blood Glucose Intervention Range: <131 mg/dl or >546 mg/dl Time Vitals Blood Respiratory Capillary Blood Glucose Pulse Action Type: Pulse: Temperature: Taken: Pressure: Rate: Glucose (mg/dl): Meter #: Oximetry (%) Taken: Pre 07:50 130/78 66 18 97.9 159 1 none per protocol Post 10:30 124/84 66 18 97.6 130 1 none per protocol Treatment Response Treatment Toleration: Well Treatment Completion Treatment Completed without Adverse Event Status: Electronic Signature(s) Signed: 05/13/2022 10:50:26 AM By: Aleda Grana RCP, RRT, CHT Signed: 05/13/2022 1:53:30 PM By: Gene Kelp PA-C Entered By: Aleda Grana on 05/13/2022 10:42:54 Gene Brown ([id number]) -------------------------------------------------------------------------------- HBO Safety Checklist Details Patient Name: Gene Brown Date of Service: 05/13/2022 7:30 AM Medical Record Number: [id number] Patient Account Number: [id number] Date of Birth/Sex: [birth year] (71 y.o. M) Treating RN: Gene Brown Primary Care Daysha Ashmore: Gene Brown Other Clinician: Izetta Brown Referring Sharlisa Hollifield: Gene Brown Treating [name]/Extender: Gene Brown in Treatment: 18 HBO Safety Checklist Items Safety Checklist Consent Form Signed Patient voided / foley secured and emptied When did you last eato 07:00 am Last dose of injectable or oral agent 07:00 am Ostomy pouch emptied and vented if applicable NA All implantable devices assessed, documented and approved NA Intravenous access site secured and place NA Valuables secured Linens and cotton and cotton/polyester blend (less than 51% polyester) Personal oil-based products / skin lotions / body lotions removed Wigs or hairpieces removed NA Smoking or tobacco materials removed NA Books / newspapers / magazines / loose paper removed NA Cologne, aftershave, perfume and deodorant removed Jewelry removed (may wrap wedding band) Make-up removed NA Hair care products removed Battery operated devices (external) removed NA Heating patches and chemical warmers removed NA Titanium eyewear removed NA Nail polish cured greater than 10 hours NA Casting material cured greater than 10 hours NA Hearing aids removed NA Loose dentures or partials removed NA Prosthetics have been removed NA Patient demonstrates correct use of air break device (if applicable) Patient concerns have been addressed Patient grounding bracelet on and cord attached to chamber Specifics for Inpatients (complete in addition  to above) Medication sheet sent with patient Intravenous medications needed or due during therapy sent with patient Drainage tubes (e.g. nasogastric tube or chest tube secured and vented) Endotracheal or Tracheotomy tube secured Cuff deflated of air and inflated with saline Airway suctioned Electronic Signature(s)  Signed: 05/13/2022 10:50:26 AM By: Enedina Finner RCP, RRT, CHT Entered By: Enedina Finner on 05/13/2022 08:32:12

## 2022-05-13 NOTE — Progress Notes (Signed)
DEMBA, NIGH ([id number]) Visit Report for 05/13/2022 Arrival Information Details Patient Name: Gene Brown Date of Service: 05/13/2022 7:30 AM Medical Record Number: [id number] Patient Account Number: [id number] Date of Birth/Sex: 1951/06/18 (71 y.o. M) Treating RN: Cornell Barman Primary Care Dalayah Deahl: Tracie Harrier Other Clinician: Jacqulyn Bath Referring Deshonna Trnka: Tracie Harrier Treating Jaleiah Asay/Extender: Skipper Cliche in Treatment: 18 Visit Information History Since Last Visit Added or deleted any medications: No Patient Arrived: Cane Any new allergies or adverse reactions: No Arrival Time: 07:35 Had a fall or experienced change in No Accompanied By: self activities of daily living that may affect Transfer Assistance: None risk of falls: Patient Identification Verified: Yes Signs or symptoms of abuse/neglect since last visito No Secondary Verification Process Completed: Yes Hospitalized since last visit: No Patient Requires Transmission-Based No Implantable device outside of the clinic excluding No Precautions: cellular tissue based products placed in the center Patient Has Alerts: Yes since last visit: Patient Alerts: Patient on Blood Has Dressing in Place as Prescribed: Yes Thinner Has Footwear/Offloading in Place as Prescribed: Yes 01/07/2022 [place]>220 on Left: Surgical Shoe with Pressure left Relief Insole Pain Present Now: No Electronic Signature(s) Signed: 05/13/2022 10:50:26 AM By: Enedina Finner RCP, RRT, CHT Entered By: Enedina Finner on 05/13/2022 08:30:17 Gene Brown ([id number]) -------------------------------------------------------------------------------- Encounter Discharge Information Details Patient Name: Gene Brown Date of Service: 05/13/2022 7:30 AM Medical Record Number: [id number] Patient Account Number: [id number] Date of Birth/Sex: [birth year] (71 y.o. M) Treating RN: Cornell Barman Primary Care  Coda Filler: Tracie Harrier Other Clinician: Jacqulyn Bath Referring Laporsha Grealish: Tracie Harrier Treating Emmeline Winebarger/Extender: Skipper Cliche in Treatment: 18 Encounter Discharge Information Items Discharge Condition: Stable Ambulatory Status: Cane Discharge Destination: Home Transportation: Private Auto Accompanied By: self Schedule Follow-up Appointment: Yes Clinical Summary of Care: Notes Patient has an HBO treatment scheduled on 05/16/22 at 08:00 am. Electronic Signature(s) Signed: 05/13/2022 10:50:26 AM By: Enedina Finner RCP, RRT, CHT Entered By: Enedina Finner on 05/13/2022 10:46:33 Gene Brown ([id number]) -------------------------------------------------------------------------------- Vitals Details Patient Name: Gene Brown Date of Service: 05/13/2022 7:30 AM Medical Record Number: [id number] Patient Account Number: [id number] Date of Birth/Sex: [birth year] (71 y.o. M) Treating RN: Cornell Barman Primary Care Analysse Quinonez: Tracie Harrier Other Clinician: Jacqulyn Bath Referring Amandalynn Pitz: Tracie Harrier Treating Sevan Mcbroom/Extender: Skipper Cliche in Treatment: 18 Vital Signs Time Taken: 07:50 Temperature (F): 97.9 Height (in): 68 Pulse (bpm): 66 Weight (lbs): 217 Respiratory Rate (breaths/min): 18 Body Mass Index (BMI): 33 Blood Pressure (mmHg): 130/78 Capillary Blood Glucose (mg/dl): 159 Reference Range: 80 - 120 mg / dl Electronic Signature(s) Signed: 05/13/2022 10:50:26 AM By: Enedina Finner RCP, RRT, CHT Entered By: Enedina Finner on 05/13/2022 08:30:52

## 2022-05-16 ENCOUNTER — Encounter: Payer: Medicare Other | Admitting: Physician Assistant

## 2022-05-16 DIAGNOSIS — E1169 Type 2 diabetes mellitus with other specified complication: Secondary | ICD-10-CM | POA: Diagnosis not present

## 2022-05-16 LAB — GLUCOSE, CAPILLARY
Glucose-Capillary: 165 mg/dL — ABNORMAL HIGH (ref 70–99)
Glucose-Capillary: 135 mg/dL — ABNORMAL HIGH (ref 70–99)

## 2022-05-16 NOTE — Progress Notes (Signed)
KALEP, FULL ([id number]) Visit Report for 05/16/2022 Arrival Information Details Patient Name: Gene Brown, Gene Brown Date of Service: 05/16/2022 8:00 AM Medical Record Number: [id number] Patient Account Number: [id number] Date of Birth/Sex: 1950-11-27 (71 y.o. M) Treating RN: Cornell Barman Primary Care Stillman Buenger: Tracie Harrier Other Clinician: Jacqulyn Bath Referring Ceejay Kegley: Tracie Harrier Treating Braian Tijerina/Extender: Skipper Cliche in Treatment: 18 Visit Information History Since Last Visit Added or deleted any medications: No Patient Arrived: Cane Any new allergies or adverse reactions: No Arrival Time: 07:50 Had a fall or experienced change in No Accompanied By: self activities of daily living that may affect Transfer Assistance: None risk of falls: Patient Identification Verified: Yes Signs or symptoms of abuse/neglect since last visito No Secondary Verification Process Completed: Yes Hospitalized since last visit: No Patient Requires Transmission-Based No Implantable device outside of the clinic excluding No Precautions: cellular tissue based products placed in the center Patient Has Alerts: Yes since last visit: Patient Alerts: Patient on Blood Has Dressing in Place as Prescribed: Yes Thinner Has Footwear/Offloading in Place as Prescribed: Yes 01/07/2022 [place]>220 on Left: Surgical Shoe with Pressure left Relief Insole Pain Present Now: No Electronic Signature(s) Signed: 05/16/2022 11:01:26 AM By: Enedina Finner RCP, RRT, CHT Entered By: Enedina Finner on 05/16/2022 08:28:20 Gene Brown ([id number]) -------------------------------------------------------------------------------- Encounter Discharge Information Details Patient Name: Gene Brown Date of Service: 05/16/2022 8:00 AM Medical Record Number: [id number] Patient Account Number: [id number] Date of Birth/Sex: 12-25-1950 (71 y.o. M) Treating RN: Cornell Barman Primary Care  Arvie Bartholomew: Tracie Harrier Other Clinician: Jacqulyn Bath Referring Neriah Brott: Tracie Harrier Treating Chivonne Rascon/Extender: Skipper Cliche in Treatment: 18 Encounter Discharge Information Items Discharge Condition: Stable Ambulatory Status: Ambulatory Discharge Destination: Home Transportation: Private Auto Accompanied By: self Schedule Follow-up Appointment: Yes Clinical Summary of Care: Notes Patient has an HBO treatment scheduled on 05/17/22 at 08:00 am. Electronic Signature(s) Signed: 05/16/2022 11:01:26 AM By: Enedina Finner RCP, RRT, CHT Entered By: Enedina Finner on 05/16/2022 10:59:51 Gene Brown ([id number]) -------------------------------------------------------------------------------- Vitals Details Patient Name: Gene Brown Date of Service: 05/16/2022 8:00 AM Medical Record Number: [id number] Patient Account Number: [id number] Date of Birth/Sex: July 09, 1951 (71 y.o. M) Treating RN: Cornell Barman Primary Care Edouard Gikas: Tracie Harrier Other Clinician: Jacqulyn Bath Referring Jette Lewan: Tracie Harrier Treating Shenna Brissette/Extender: Skipper Cliche in Treatment: 18 Vital Signs Time Taken: 08:06 Temperature (F): 97.7 Height (in): 68 Pulse (bpm): 72 Weight (lbs): 217 Respiratory Rate (breaths/min): 18 Body Mass Index (BMI): 33 Blood Pressure (mmHg): 120/72 Capillary Blood Glucose (mg/dl): 165 Reference Range: 80 - 120 mg / dl Electronic Signature(s) Signed: 05/16/2022 11:01:26 AM By: Enedina Finner RCP, RRT, CHT Entered By: Enedina Finner on 05/16/2022 08:30:09

## 2022-05-17 ENCOUNTER — Encounter: Payer: Medicare Other | Admitting: Physician Assistant

## 2022-05-17 DIAGNOSIS — E1169 Type 2 diabetes mellitus with other specified complication: Secondary | ICD-10-CM | POA: Diagnosis not present

## 2022-05-17 LAB — GLUCOSE, CAPILLARY
Glucose-Capillary: 77 mg/dL (ref 70–99)
Glucose-Capillary: 150 mg/dL — ABNORMAL HIGH (ref 70–99)
Glucose-Capillary: 169 mg/dL — ABNORMAL HIGH (ref 70–99)

## 2022-05-17 NOTE — Progress Notes (Addendum)
MONTERIUS, ROLF ([id number]) Visit Report for 05/17/2022 Chief Complaint Document Details Patient Name: Gene Brown, Gene Brown Date of Service: 05/17/2022 11:00 AM Medical Record Number: [id number] Patient Account Number: [id number] Date of Birth/Sex: 30-Dec-1950 (71 y.o. M) Treating RN: Yevonne Pax Primary Care Provider: Barbette Reichmann Other Clinician: Referring Provider: Barbette Reichmann Treating Provider/Extender: Rowan Blase in Treatment: 18 Information Obtained from: Patient Chief Complaint Left great toe ulcer Electronic Signature(s) Signed: 05/17/2022 11:24:47 AM By: Lenda Kelp PA-C Entered By: Lenda Kelp on 05/17/2022 11:24:47 Gene Brown ([id number]) -------------------------------------------------------------------------------- Debridement Details Patient Name: Gene Brown Date of Service: 05/17/2022 11:00 AM Medical Record Number: [id number] Patient Account Number: [id number] Date of Birth/Sex: 10-Jul-1951 (71 y.o. M) Treating RN: Yevonne Pax Primary Care Provider: Barbette Reichmann Other Clinician: Referring Provider: Barbette Reichmann Treating Provider/Extender: Rowan Blase in Treatment: 18 Debridement Performed for Wound #2 Left,Plantar Toe Great Assessment: Performed By: Physician Nelida Meuse., PA-C Debridement Type: Debridement Severity of Tissue Pre Debridement: Fat layer exposed Level of Consciousness (Pre- Awake and Alert procedure): Pre-procedure Verification/Time Out Yes - 11:40 Taken: Start Time: 11:40 Total Area Debrided (L x W): 0.2 (cm) x 0.2 (cm) = 0.04 (cm) Tissue and other material Viable, Non-Viable, Callus, Subcutaneous debrided: Level: Skin/Subcutaneous Tissue Debridement Description: Excisional Instrument: Curette Bleeding: Minimum Hemostasis Achieved: Pressure Response to Treatment: Procedure was tolerated well Level of Consciousness (Post- Awake and Alert procedure): Post Debridement Measurements  of Total Wound Length: (cm) 0.2 Width: (cm) 0.2 Depth: (cm) 0.2 Volume: (cm) 0.006 Character of Wound/Ulcer Post Debridement: Improved Severity of Tissue Post Debridement: Fat layer exposed Post Procedure Diagnosis Same as Pre-procedure Electronic Signature(s) Signed: 05/17/2022 6:10:21 PM By: Lenda Kelp PA-C Signed: 05/20/2022 12:25:01 PM By: Yevonne Pax RN Entered By: Yevonne Pax on 05/17/2022 11:52:09 Gene Brown ([id number]) -------------------------------------------------------------------------------- Debridement Details Patient Name: Gene Brown Date of Service: 05/17/2022 11:00 AM Medical Record Number: [id number] Patient Account Number: [id number] Date of Birth/Sex: 1950/08/24 (71 y.o. M) Treating RN: Yevonne Pax Primary Care Provider: Barbette Reichmann Other Clinician: Referring Provider: Barbette Reichmann Treating Provider/Extender: Rowan Blase in Treatment: 18 Debridement Performed for Wound #3 Left,Dorsal Toe Great Assessment: Performed By: Physician Nelida Meuse., PA-C Debridement Type: Debridement Severity of Tissue Pre Debridement: Fat layer exposed Level of Consciousness (Pre- Awake and Alert procedure): Pre-procedure Verification/Time Out Yes - 11:42 Taken: Start Time: 11:42 Total Area Debrided (L x W): 0.3 (cm) x 0.2 (cm) = 0.06 (cm) Tissue and other material Viable, Non-Viable, Callus, Subcutaneous debrided: Level: Skin/Subcutaneous Tissue Debridement Description: Excisional Instrument: Curette Bleeding: Minimum Hemostasis Achieved: Pressure Response to Treatment: Procedure was tolerated well Level of Consciousness (Post- Awake and Alert procedure): Post Debridement Measurements of Total Wound Length: (cm) 0.3 Width: (cm) 0.2 Depth: (cm) 0.4 Volume: (cm) 0.019 Character of Wound/Ulcer Post Debridement: Improved Severity of Tissue Post Debridement: Fat layer exposed Post Procedure Diagnosis Same as  Pre-procedure Electronic Signature(s) Signed: 05/17/2022 6:10:21 PM By: Lenda Kelp PA-C Signed: 05/20/2022 12:25:01 PM By: Yevonne Pax RN Entered By: Yevonne Pax on 05/17/2022 11:52:54 Gene Brown ([id number]) -------------------------------------------------------------------------------- HPI Details Patient Name: Gene Brown Date of Service: 05/17/2022 11:00 AM Medical Record Number: [id number] Patient Account Number: [id number] Date of Birth/Sex: [birth year] (70 y.o. M) Treating RN: Yevonne Pax Primary Care Provider: Barbette Reichmann Other Clinician: Referring Provider: Barbette Reichmann Treating Provider/Extender: Rowan Blase in Treatment: 18 History of Present Illness HPI Description: 05/24/16; this is a 71 year old type II diabetic who probably has some degree of  polyneuropathy. He tells me that he has had an area on the plantar aspect of his left great toe that is been followed by podiatry for most the last year. He has a lot of callus, the podiatrist which shave this down put a dressing on and he would follow pad monthly or sometimes longer intervals. He has not noted any drainage or pain. He has not had a recent x-ray. No serious attempt at offloading this with modified footwear.. Last hemoglobin A1c that he knows about was over 10 a month ago he is working hard on this with his primary physician. More recently he has been soaking this with peroxide ABIs are noncompressible and the left leg. He was told by his podiatrist he has a bone spur underneath the wound but did not wish any surgical procedures. He has a caregiver at home for a very disabled wife, the nature of her illness is not certain 05/31/16 patient comes in much the same as last week amounts of callus nonviable subcutaneous tissue which required debridement. His x-ray suggested cortical bone irregularity. He will need an MRI. He tells me he has old screws/hardware left knee however he thinks he has  had a subsequent MRI after this 06/07/16 at this point in time patient has been attempting to offload his wound as much as he could on his own. He has made a conscious effort to avoid placing any pressure on the toe as he was moving around and walking and I do believe this has payed off and what I am seeing at this point in time. overall the wound appears to be somewhat improved in my opinion he is not having any significant discomfort at this point in time. In fact he tells me he has no pain at all. There is no interval sign of surrounding infection in the wound itself has actually decreased in size. Unfortunately the MRI was not ordered last week and therefore we do need to proceed with the MRI order today. again this is to evaluate the cortical bone irregularity noted on x-ray. 06/14/16 Patient has continued to use the offloading shoe at this point in time. He tells me this is somewhat difficult to get used to walking and but nonetheless he is very strict about wearing it. Again I believe this has paid off very well for him and his wound appears to be greatly improved. He does have some callused area surrounding the wound proximally that has caused some fluid collection of maceration of the callused area. This likely will require debridement today. Some adherent slough noted over the wound bed. 06/21/16; patient's wound on the plantar aspect of his left great toe is totally epithelialized. His MRI of the foot is tomorrow to follow-up on the cortical irregularity noted on plain x-ray. He does not have diabetic foot where 06-28-16-He comes in today with a continued healed left great toe. MRI performed on 06/23/2016 shows no abscess osteomyelitis or septic joint. Patient was discharged from wound care center today. Readmission: 01-07-2022 upon evaluation today patient appears to be doing somewhat poorly in regard to total ulcer on his left foot. Fortunately there does not appear to be any signs of  infection which is great news. Has been seen by Dr. Excell Seltzer and he did request from Dr. Excell Seltzer to come see [place] due to the fact that in the past several years back we were able to get one of his wounds healed about 4 weeks. Again I explained that that is always variable  but nonetheless I definitely think we can help. Fortunately I see no evidence of infection. He does have a foot offloading shoe which I think is good to attempt for now he was able to get this healed before with a peg assist. Patient does have a history of type 2 diabetes, diabetic polyneuropathy, and hypertension. This current wound started on 12-09-2021. 01-20-2022 upon evaluation today patient appears to be doing poorly in regard to his great toe. I am actually very concerned based on what I am seeing here at this point about how things are progressing. I do not believe there is any evidence of infection systemically that is obvious but locally I am concerned about the possibility this could even be an osteomyelitis type situation. 01-28-2022 upon evaluation today patient appears to be doing well with regard to his toe ulcer compared to what we were previous. Fortunately I do not see any signs of active infection at this time which is great news. No fevers, chills, nausea, vomiting, or diarrhea. With that being said the patient has not started his Augmentin yet which I definitely think he needs to get on board ASAP. I do see some signs of new skin growth I think he is using the front offloading shoe which is good for the time being. 02-04-2022 upon evaluation today patient appears to be doing okay in regard to his toe ulcer this is actually measuring a little bit smaller but still has a bit of callus buildup but not as much as we have seen in the past. Fortunately I do not see any evidence of active infection locally or systemically which is great news. No fevers, chills, nausea, vomiting, or diarrhea. Marland Kitchen 02-14-2022 upon evaluation today  patient's wound is actually showing signs of good improvement which is great news. Fortunately I do not see any evidence of active infection locally or systemically which is great news as well. No fevers, chills, nausea, vomiting, or diarrhea. 02-21-2022 upon evaluation today patient's toe ulcer actually is showing signs of excellent improvement. I do not see any evidence of infection locally or systemically which is great news and overall I am extremely pleased with where we stand today. I do not see any signs of active infection locally or systemically which is great news. No fevers, chills, nausea, vomiting, or diarrhea. 7/24; plantar left great toe no real improvement since last week. Using silver alginate gauze. He has a forefoot offloading boot. He also has lymphedema and uses his compression pumps I do not think this is adding any particular difficulties. It sounds as though he is still pretty active 03-07-2022 upon evaluation today patient appears to be doing excellent in regard to the wound compared to previous pictures. He still has a quite deep wound on the toe good news is the x-ray did not show any signs of osteomyelitis which is excellent. He does need some debridement today. 03-14-2022 upon evaluation today patient appears to be doing well currently in regard to his wound. He has actually been tolerating the dressing changes without complication. With that being said the dressing seems to either be draining a lot or getting very wet otherwise I think we need to have him change this more frequently. Gene Brown, Gene Brown ([id number]) 03-21-2022 upon evaluation today patient appears to be doing well with regard to his toe ulcer I am actually seeing signs of improvement which is great news. This is slow but nonetheless the skin around the edges of the wound are definitely improving as we  proceed here. I do not see any evidence of active infection locally or systemically which is great  news. 03-29-2022 upon evaluation today patient appears to be doing fairly well except for with regard to his toe being somewhat red. I do think that we may want to place him back on the Augmentin which was previously beneficial for him. He is not in disagreement with this. Otherwise I did perform some debridement of clearway some of the necrotic debris today he seems to be tolerating this without complication. 04-04-2022 upon evaluation today patient unfortunately is continued to have significant issues here with his toe ulcer. He actually has an abscess more proximal on the dorsal aspect of the toe which is new since last week. Subsequently he is on antibiotics already I think we need to proceed with an MRI to further evaluate this. 04-12-2022 upon evaluation today patient appears to be doing better in regard to his toe ulcer. This actually is less red than what it was he is about done with the initial antibiotic he does has his MRI scheduled for Friday. The reason I am going to probably extend the Augmentin for the time being I will to make sure that we keep this under control especially if he potentially has a bone infection I do not going to be going without the antibiotic therapy. Patient is in agreement with that plan. 04-18-2022 upon evaluation today patient does have an MRI for review that was obtained on Sunday. 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 He has been on antibiotics I had him on Augmentin starting initially on January 25, 2022. Continuing to be a problem here. I do not see any signs of infection spreading up his foot but the toe itself still has purulent drainage coming from it. Coupled with this I have had him on the antibiotics, Augmentin, and he is tolerating that quite well. He is actually been on this since 01-20-2022. During that time  his wound is getting somewhat better. But is still been very slow which is what prompted the MRI due to the fact that I felt like at a later point he started develop an abscess on the top of his toe that then had me more concerned. At this point I do believe that he is probably can require hyperbaric oxygen therapy I think that this is the ideal treatment plan for limb salvage coupled with the antibiotic therapy and good wound care. 04-25-2022 upon evaluation today patient appears to be doing okay in regard to his foot ulcers. He actually has an opening on the dorsal aspect of the toe which does not directly connect to the plantar aspect. This is still open in both locations he did have some callus covering that did require sharp debridement today to clear this away. Fortunately there does not appear to be any evidence of active infection at this point. 05-02-2022 upon evaluation today patient appears to be doing well in regard to his wounds. He has been tolerating the dressing changes without complication. With that being said he does have some evidence here of continued drainage. There is evidence of infection as well although I do believe that the medicine is helping to keep this under good control. His erythema is improved although the swelling of the great toe is still present. He has started HBO as  of this morning. 05-10-2022 upon evaluation patient's ulcers on his toe actually are appearing to do much better. I do think he is benefiting from the hyperbarics as well as the antibiotics as well is good wound care. Overall I think that we have a good plan going and the patient is benefiting from this greatly which is also news. I do not see any signs of clinical active infection although obviously were still in the process of treating this osteomyelitis 05-17-2022 upon evaluation today patient's wounds are actually showing signs of good granulation and epithelization there is actually some  callus covering To check to see whether this is still open or not. Nonetheless I do believe both wounds are significantly improved compared to previous and there is no erythema or redness-like his toe is doing better and I think he is doing well with the hyperbarics as well as with the oral antibiotics and wound care currently. Electronic Signature(s) Signed: 05/17/2022 1:24:01 PM By: Lenda Kelp PA-C Entered By: Lenda Kelp on 05/17/2022 13:24:01 Gene Brown ([id number]) -------------------------------------------------------------------------------- Physical Exam Details Patient Name: Gene Brown Date of Service: 05/17/2022 11:00 AM Medical Record Number: [id number] Patient Account Number: [id number] Date of Birth/Sex: 1950-08-18 (71 y.o. M) Treating RN: Yevonne Pax Primary Care Provider: Barbette Reichmann Other Clinician: Referring Provider: Barbette Reichmann Treating Provider/Extender: Rowan Blase in Treatment: 18 Constitutional Obese and well-hydrated in no acute distress. Respiratory normal breathing without difficulty. Psychiatric this patient is able to make decisions and demonstrates good insight into disease process. Alert and Oriented x 3. pleasant and cooperative. Notes Upon inspection patient's wound bed actually showed signs of good granulation and epithelization at this point. Fortunately I do not see any evidence of active infection locally or systemically which is great news and overall I am extremely pleased with where we stand today. Electronic Signature(s) Signed: 05/17/2022 1:24:16 PM By: Lenda Kelp PA-C Entered By: Lenda Kelp on 05/17/2022 13:24:16 Gene Brown ([id number]) -------------------------------------------------------------------------------- Physician Orders Details Patient Name: Gene Brown Date of Service: 05/17/2022 11:00 AM Medical Record Number: [id number] Patient Account Number: [id number] Date of  Birth/Sex: Nov 19, 1950 (71 y.o. M) Treating RN: Yevonne Pax Primary Care Provider: Barbette Reichmann Other Clinician: Referring Provider: Barbette Reichmann Treating Provider/Extender: Rowan Blase in Treatment: 18 Verbal / Phone Orders: No Diagnosis Coding ICD-10 Coding Code Description [phone] Other chronic osteomyelitis, left ankle and foot E11.621 Type 2 diabetes mellitus with foot ulcer 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osed E11.42 Type 2 diabetes mellitus with diabetic polyneuropathy I10 Essential (primary) hypertension Follow-up Appointments Wound #2 Left,Plantar Toe Great o Return Appointment in 1 week. o Nurse Visit as needed Bathing/ Shower/ Hygiene o May shower; gently cleanse wound with antibacterial soap, rinse and pat dry prior to dressing wounds o No tub bath. Anesthetic (Use 'Patient Medications' Section for Anesthetic Order Entry) o Lidocaine applied to wound bed Edema Control - Lymphedema / Segmental Compressive Device / Other o Patient to wear own Velcro compression garment. Remove compression stockings every night before going to bed and put on every morning when getting up. o Compression Pump: Use compression pump on left lower extremity for 60 minutes, twice daily. o Compression Pump: Use compression pump on right lower extremity for 60 minutes, twice daily. o DO YOUR BEST to sleep in the bed at night. DO NOT sleep in your recliner. Long hours of sitting in a recliner leads to swelling of the legs and/or potential  wounds on your backside. Off-Loading o Offloading felt to foot. - donut cut in offloading felt o Other: - front off-loader left Additional Orders / Instructions o Follow Nutritious Diet and Increase Protein Intake Hyperbaric Oxygen Therapy o Indication and location: - CRO Left great toe o 2.0 ATA for 90 Minutes without Air Breaks o One treatment per day (delivered  Monday through Friday unless otherwise specified in Special Instructions below): o Total # of Treatments: - 40 o Antihistamine 30 minutes prior to HBO Treatment, difficulty clearing ears. o Finger stick Blood Glucose Pre- and Post- HBOT Treatment. o Follow Hyperbaric Oxygen Gene Brown Wound Treatment Wound #2 - Toe Great Wound Laterality: Plantar, Left Primary Dressing: Prisma 4.34 (in) 1 x Per OIN/86 Days Discharge Instructions: Moisten w/normal saline or sterile water; Cover wound as directed. Do not remove from wound bed. Secondary Dressing: Gauze 1 x Per VEH/20 Days Discharge Instructions: As directed: dry, moistened with saline or moistened with Dakins Solution Secured With: Medipore Tape - 39M Medipore H Soft Cloth Surgical Tape, 2x2 (in/yd) 1 x Per Day/15 Days Gene Brown, Gene Brown ([id number]) Wound #3 - Toe Great Wound Laterality: Dorsal, Left Primary Dressing: Prisma 4.34 (in) 1 x Per Day/15 Days Discharge Instructions: Moisten w/normal saline or sterile water; Cover wound as directed. Do not remove from wound bed. Secondary Dressing: Gauze 1 x Per MOQ/94 Days Discharge Instructions: As directed: dry, moistened with saline or moistened with Dakins Solution Secured With: Medipore Tape - 39M Medipore H Soft Cloth Surgical Tape, 2x2 (in/yd) 1 x Per Day/15 Days Gene Brown PRE-HBO Gene INTERVENTIONS ACTION INTERVENTION Obtain pre-HBO capillary blood glucose (ensure 1 physician order is in chart). A. Notify HBO physician and await physician orders. 2 If result is 70 mg/dl or below: B. If the result meets the hospital definition of a critical result, follow hospital policy. A. Give patient an 8 ounce Glucerna Shake, an 8 ounce Ensure, or 8 ounces of a Glucerna/Ensure equivalent dietary supplement*. B. Wait 30 minutes. If result is 71 mg/dl to 130 mg/dl: C. Retest patientos capillary blood glucose (CBG). D. If result greater than or equal to  110 mg/dl, proceed with HBO. If result less than 110 mg/dl, notify HBO physician and consider holding HBO. If result is 131 mg/dl to 249 mg/dl: A. Proceed with HBO. A. Notify HBO physician and await physician orders. B. It is recommended to hold HBO and do If result is 250 mg/dl or greater: blood/urine ketone testing. C. If the result meets the hospital definition of a critical result, follow hospital policy. POST-HBO Gene INTERVENTIONS ACTION INTERVENTION Obtain post HBO capillary blood glucose (ensure 1 physician order is in chart). A. Notify HBO physician and await physician orders. 2 If result is 70 mg/dl or below: B. If the result meets the hospital definition of a critical result, follow hospital policy. A. Give patient an 8 ounce Glucerna Shake, an 8 ounce Ensure, or 8 ounces of a Glucerna/Ensure equivalent dietary supplement*. B. Wait 15 minutes for symptoms of hypoglycemia (i.e. nervousness, anxiety, If result is 71 mg/dl to 100 mg/dl: sweating, chills, clamminess, irritability, confusion, tachycardia or dizziness). C. If patient asymptomatic, discharge patient. If patient symptomatic, repeat capillary blood glucose (CBG) and notify HBO physician. If result is 101 mg/dl to 249 mg/dl: A. Discharge patient. A. Notify HBO physician and await physician orders. B. It is recommended to do blood/urine If result is 250 mg/dl or greater: ketone testing. C. If the result meets the hospital definition of a critical result,  follow hospital policy. *Juice or candies are NOT equivalent products. If patient refuses the Glucerna or Ensure, please consult the hospital dietitian for an appropriate substitute. Electronic Signature(s) Signed: 05/17/2022 6:10:21 PM By: Lenda Kelp PA-C Signed: 05/20/2022 12:25:01 PM By: Yevonne Pax RN Entered By: Yevonne Pax on 05/17/2022 12:37:57 Gene Brown ([id number], Gene Brown  ([id number]) -------------------------------------------------------------------------------- Prescription 05/17/2022 Patient Name: Gene Brown Provider: Allen Derry PA-C Date of Birth: 01-07-1951 NPI#: [id number] Sex: [name] DEA#: [id number] Phone #: [phone] License #: Patient Address: [facility] [address] [facility] [place], [place] [address] [address], [address] [place], [place] [address] [phone] Allergies Statins-Hmg-Coa Reductase Inhibitors Provider's Orders Patient to wear own Velcro compression garment. Remove compression stockings every night before going to bed and put on every morning when getting up. Hand Signature: Date(s): Electronic Signature(s) Signed: 05/17/2022 6:10:21 PM By: Lenda Kelp PA-C Signed: 05/20/2022 12:25:01 PM By: Yevonne Pax RN Entered By: Yevonne Pax on 05/17/2022 12:37:57 Gene Brown ([id number]) --------------------------------------------------------------------------------  Problem List Details Patient Name: Gene Brown Date of Service: 05/17/2022 11:00 AM Medical Record Number: [id number] Patient Account Number: [id number] Date of Birth/Sex: 1950/12/06 (71 y.o. M) Treating RN: Yevonne Pax Primary Care Provider: Barbette Reichmann Other Clinician: Referring Provider: Barbette Reichmann Treating Provider/Extender: Rowan Blase in Treatment: 18 Active Problems ICD-10 Encounter Code Description Active Date MDM Diagnosis [phone] Other chronic osteomyelitis, left ankle and foot 04/25/2022 No Yes E11.621 Type 2 diabetes mellitus with foot ulcer 01/07/2022 No Yes L97.522 Non-pressure chronic ulcer of other part of left foot with fat layer 01/07/2022 No Yes exposed E11.42 Type 2 diabetes mellitus with diabetic polyneuropathy 01/07/2022 No Yes I10 Essential (primary) hypertension 01/07/2022 No Yes Inactive Problems Resolved Problems Electronic  Signature(s) Signed: 05/17/2022 11:24:40 AM By: Lenda Kelp PA-C Entered By: Lenda Kelp on 05/17/2022 11:24:40 Gene Brown ([id number]) -------------------------------------------------------------------------------- Progress Note Details Patient Name: Gene Brown Date of Service: 05/17/2022 11:00 AM Medical Record Number: [id number] Patient Account Number: [id number] Date of Birth/Sex: 09-May-1951 (71 y.o. M) Treating RN: Yevonne Pax Primary Care Provider: Barbette Reichmann Other Clinician: Referring Provider: Barbette Reichmann Treating Provider/Extender: Rowan Blase in Treatment: 18 Subjective Chief Complaint Information obtained from Patient Left great toe ulcer History of Present Illness (HPI) 05/24/16; this is a 71 year old type II diabetic who probably has some degree of polyneuropathy. He tells me that he has had an area on the plantar aspect of his left great toe that is been followed by podiatry for most the last year. He has a lot of callus, the podiatrist which shave this down put a dressing on and he would follow pad monthly or sometimes longer intervals. He has not noted any drainage or pain. He has not had a recent x-ray. No serious attempt at offloading this with modified footwear.. Last hemoglobin A1c that he knows about was over 10 a month ago he is working hard on this with his primary physician. More recently he has been soaking this with peroxide ABIs are noncompressible and the left leg. He was told by his podiatrist he has a bone spur underneath the wound but did not wish any surgical procedures. He has a caregiver at home for a very disabled wife, the nature of her illness is not certain 05/31/16 patient comes in much the same as last week amounts of callus nonviable subcutaneous tissue which required debridement. His x-ray suggested cortical bone irregularity. He will need an MRI.  He tells me he has old screws/hardware left knee however he  thinks he has had a subsequent MRI after this 06/07/16 at this point in time patient has been attempting to offload his wound as much as he could on his own. He has made a conscious effort to avoid placing any pressure on the toe as he was moving around and walking and I do believe this has payed off and what I am seeing at this point in time. overall the wound appears to be somewhat improved in my opinion he is not having any significant discomfort at this point in time. In fact he tells me he has no pain at all. There is no interval sign of surrounding infection in the wound itself has actually decreased in size. Unfortunately the MRI was not ordered last week and therefore we do need to proceed with the MRI order today. again this is to evaluate the cortical bone irregularity noted on x-ray. 06/14/16 Patient has continued to use the offloading shoe at this point in time. He tells me this is somewhat difficult to get used to walking and but nonetheless he is very strict about wearing it. Again I believe this has paid off very well for him and his wound appears to be greatly improved. He does have some callused area surrounding the wound proximally that has caused some fluid collection of maceration of the callused area. This likely will require debridement today. Some adherent slough noted over the wound bed. 06/21/16; patient's wound on the plantar aspect of his left great toe is totally epithelialized. His MRI of the foot is tomorrow to follow-up on the cortical irregularity noted on plain x-ray. He does not have diabetic foot where 06-28-16-He comes in today with a continued healed left great toe. MRI performed on 06/23/2016 shows no abscess osteomyelitis or septic joint. Patient was discharged from wound care center today. Readmission: 01-07-2022 upon evaluation today patient appears to be doing somewhat poorly in regard to total ulcer on his left foot. Fortunately there does not appear to be  any signs of infection which is great news. Has been seen by Dr. Excell Seltzer and he did request from Dr. Excell Seltzer to come see [place] due to the fact that in the past several years back we were able to get one of his wounds healed about 4 weeks. Again I explained that that is always variable but nonetheless I definitely think we can help. Fortunately I see no evidence of infection. He does have a foot offloading shoe which I think is good to attempt for now he was able to get this healed before with a peg assist. Patient does have a history of type 2 diabetes, diabetic polyneuropathy, and hypertension. This current wound started on 12-09-2021. 01-20-2022 upon evaluation today patient appears to be doing poorly in regard to his great toe. I am actually very concerned based on what I am seeing here at this point about how things are progressing. I do not believe there is any evidence of infection systemically that is obvious but locally I am concerned about the possibility this could even be an osteomyelitis type situation. 01-28-2022 upon evaluation today patient appears to be doing well with regard to his toe ulcer compared to what we were previous. Fortunately I do not see any signs of active infection at this time which is great news. No fevers, chills, nausea, vomiting, or diarrhea. With that being said the patient has not started his Augmentin yet which I definitely  think he needs to get on board ASAP. I do see some signs of new skin growth I think he is using the front offloading shoe which is good for the time being. 02-04-2022 upon evaluation today patient appears to be doing okay in regard to his toe ulcer this is actually measuring a little bit smaller but still has a bit of callus buildup but not as much as we have seen in the past. Fortunately I do not see any evidence of active infection locally or systemically which is great news. No fevers, chills, nausea, vomiting, or diarrhea. Marland Kitchen 02-14-2022 upon  evaluation today patient's wound is actually showing signs of good improvement which is great news. Fortunately I do not see any evidence of active infection locally or systemically which is great news as well. No fevers, chills, nausea, vomiting, or diarrhea. 02-21-2022 upon evaluation today patient's toe ulcer actually is showing signs of excellent improvement. I do not see any evidence of infection locally or systemically which is great news and overall I am extremely pleased with where we stand today. I do not see any signs of active infection locally or systemically which is great news. No fevers, chills, nausea, vomiting, or diarrhea. 7/24; plantar left great toe no real improvement since last week. Using silver alginate gauze. He has a forefoot offloading boot. He also has lymphedema and uses his compression pumps I do not think this is adding any particular difficulties. It sounds as though he is still pretty active 03-07-2022 upon evaluation today patient appears to be doing excellent in regard to the wound compared to previous pictures. He still has a quite Gene Brown, Gene Brown. ([id number]) deep wound on the toe good news is the x-ray did not show any signs of osteomyelitis which is excellent. He does need some debridement today. 03-14-2022 upon evaluation today patient appears to be doing well currently in regard to his wound. He has actually been tolerating the dressing changes without complication. With that being said the dressing seems to either be draining a lot or getting very wet otherwise I think we need to have him change this more frequently. 03-21-2022 upon evaluation today patient appears to be doing well with regard to his toe ulcer I am actually seeing signs of improvement which is great news. This is slow but nonetheless the skin around the edges of the wound are definitely improving as we proceed here. I do not see any evidence of active infection locally or systemically which is  great news. 03-29-2022 upon evaluation today patient appears to be doing fairly well except for with regard to his toe being somewhat red. I do think that we may want to place him back on the Augmentin which was previously beneficial for him. He is not in disagreement with this. Otherwise I did perform some debridement of clearway some of the necrotic debris today he seems to be tolerating this without complication. 04-04-2022 upon evaluation today patient unfortunately is continued to have significant issues here with his toe ulcer. He actually has an abscess more proximal on the dorsal aspect of the toe which is new since last week. Subsequently he is on antibiotics already I think we need to proceed with an MRI to further evaluate this. 04-12-2022 upon evaluation today patient appears to be doing better in regard to his toe ulcer. This actually is less red than what it was he is about done with the initial antibiotic he does has his MRI scheduled for Friday. The reason I  am going to probably extend the Augmentin for the time being I will to make sure that we keep this under control especially if he potentially has a bone infection I do not going to be going without the antibiotic therapy. Patient is in agreement with that plan. 04-18-2022 upon evaluation today patient does have an MRI for review that was obtained on Sunday. 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 He has been on antibiotics I had him on Augmentin starting initially on January 25, 2022. Continuing to be a problem here. I do not see any signs of infection spreading up his foot but the toe itself still has purulent drainage coming from it. Coupled with this I have had him on the antibiotics, Augmentin, and he is tolerating that quite well. He is actually been on this since 01-20-2022. During that  time his wound is getting somewhat better. But is still been very slow which is what prompted the MRI due to the fact that I felt like at a later point he started develop an abscess on the top of his toe that then had me more concerned. At this point I do believe that he is probably can require hyperbaric oxygen therapy I think that this is the ideal treatment plan for limb salvage coupled with the antibiotic therapy and good wound care. 04-25-2022 upon evaluation today patient appears to be doing okay in regard to his foot ulcers. He actually has an opening on the dorsal aspect of the toe which does not directly connect to the plantar aspect. This is still open in both locations he did have some callus covering that did require sharp debridement today to clear this away. Fortunately there does not appear to be any evidence of active infection at this point. 05-02-2022 upon evaluation today patient appears to be doing well in regard to his wounds. He has been tolerating the dressing changes without complication. With that being said he does have some evidence here of continued drainage. There is evidence of infection as well although I do believe that the medicine is helping to keep this under good control. His erythema is improved although the swelling of the great toe is still present. He has started HBO as of this morning. 05-10-2022 upon evaluation patient's ulcers on his toe actually are appearing to do much better. I do think he is benefiting from the hyperbarics as well as the antibiotics as well is good wound care. Overall I think that we have a good plan going and the patient is benefiting from this greatly which is also news. I do not see any signs of clinical active infection although obviously were still in the process of treating this osteomyelitis 05-17-2022 upon evaluation today patient's wounds are actually showing signs of good granulation and epithelization there is actually some  callus covering To check to see whether this is still open or not. Nonetheless I do believe both wounds are significantly improved compared to previous and there is no erythema or redness-like his toe is doing better and I think he is doing well with the hyperbarics as well as with the oral antibiotics and wound care currently. Objective Constitutional Obese and well-hydrated in no acute distress. Vitals Time Taken: 11:22 AM, Height: 68 in, Weight: 217 lbs, BMI: 33, Temperature: 97.6 F, Pulse: 67 bpm, Respiratory Rate:  18 breaths/min, Blood Pressure: 172/78 mmHg. Respiratory normal breathing without difficulty. Psychiatric this patient is able to make decisions and demonstrates good insight into disease process. Alert and Oriented x 3. pleasant and cooperative. General Notes: Upon inspection patient's wound bed actually showed signs of good granulation and epithelization at this point. Fortunately I do not see any evidence of active infection locally or systemically which is great news and overall I am extremely pleased with where we stand today. Integumentary (Hair, Skin) Gene Brown, Gene V. ([id number]) Wound #2 status is Open. Original cause of wound was Gradually Appeared. The date acquired was: 12/09/2021. The wound has been in treatment 18 weeks. The wound is located on the [employer]. The wound measures 0.2cm length x 0.2cm width x 0.2cm depth; 0.031cm^2 area and 0.006cm^3 volume. There is Fat Layer (Subcutaneous Tissue) exposed. There is no tunneling or undermining noted. There is a medium amount of serosanguineous drainage noted. The wound margin is flat and intact. There is large (67-100%) pink, pale granulation within the wound bed. There is a small (1-33%) amount of necrotic tissue within the wound bed. Wound #3 status is Open. Original cause of wound was Pressure Injury. The date acquired was: 03/25/2022. The wound has been in treatment 3 weeks. The wound is located on the  [employer]. The wound measures 0.3cm length x 0.2cm width x 0.3cm depth; 0.047cm^2 area and 0.014cm^3 volume. There is Fat Layer (Subcutaneous Tissue) exposed. There is no tunneling or undermining noted. There is a none present amount of drainage noted. The wound margin is distinct with the outline attached to the wound base. There is no granulation within the wound bed. There is no necrotic tissue within the wound bed. 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 There was a Excisional Skin/Subcutaneous Tissue Debridement with a total area of 0.04 sq cm performed by Nelida Meuse., PA-C. With the following instrument(s): Curette to remove Viable and Non-Viable tissue/material. Material removed includes Callus and Subcutaneous Tissue and. No specimens were taken. A time out was conducted at 11:40, prior to the start of the procedure. A Minimum amount of bleeding was controlled with Pressure. The procedure was tolerated well. Post Debridement Measurements: 0.2cm length x 0.2cm width x 0.2cm depth; 0.006cm^3 volume. Character of Wound/Ulcer Post Debridement is improved. Severity of Tissue Post Debridement is: Fat layer exposed. Post procedure Diagnosis Wound #2: Same as Pre-Procedure Wound #3 Pre-procedure diagnosis of Wound #3 is a Diabetic Wound/Ulcer of the Lower Extremity located on the Left,Dorsal Toe Great .Severity of Tissue Pre Debridement is: Fat layer exposed. There was a Excisional Skin/Subcutaneous Tissue Debridement with a total area  of 0.06 sq cm performed by Nelida Meuse., PA-C. With the following instrument(s): Curette to remove Viable and Non-Viable tissue/material. Material removed includes Callus and Subcutaneous Tissue and. No specimens were taken. A time out was conducted at 11:42, prior to the start of the procedure. A Minimum amount of bleeding was controlled with Pressure. The procedure was tolerated well. Post Debridement Measurements: 0.3cm length x 0.2cm width x 0.4cm depth; 0.019cm^3 volume. Character of Wound/Ulcer Post Debridement is improved. Severity  of Tissue Post Debridement is: Fat layer exposed. Post procedure Diagnosis Wound #3: Same as Pre-Procedure Plan Follow-up Appointments: Wound #2 Left,Plantar Toe Great: Return Appointment in 1 week. Nurse Visit as needed Bathing/ Shower/ Hygiene: May shower; gently cleanse wound with antibacterial soap, rinse and pat dry prior to dressing wounds No tub bath. Anesthetic (Use 'Patient Medications' Section for Anesthetic Order Entry): Lidocaine applied to wound bed Edema Control - Lymphedema / Segmental Compressive Device / Other: Patient to wear own Velcro compression garment. Remove compression stockings every night before going to bed and put on every morning when getting up. Compression Pump: Use compression pump on left lower extremity for 60 minutes, twice daily. Compression Pump: Use compression pump on right lower extremity for 60 minutes, twice daily. Gene Brown, Gene Brown ([id number]) DO YOUR BEST to sleep in the bed at night. DO NOT sleep in your recliner. Long hours of sitting in a recliner leads to swelling of the legs and/or potential wounds on your backside. Off-Loading: Offloading felt to foot. - donut cut in offloading felt Other: - front off-loader left Additional Orders / Instructions: Follow Nutritious Diet and Increase Protein Intake Hyperbaric Oxygen Therapy: Indication and location: - CRO Left great toe 2.0 ATA for 90 Minutes without  Air Breaks One treatment per day (delivered Monday through Friday unless otherwise specified in Special Instructions below): Total # of Treatments: - 40 Antihistamine 30 minutes prior to HBO Treatment, difficulty clearing ears. Finger stick Blood Glucose Pre- and Post- HBOT Treatment. Follow Hyperbaric Oxygen Gene Brown WOUND #2: - Toe Great Wound Laterality: Plantar, Left Primary Dressing: Prisma 4.34 (in) 1 x Per Day/15 Days Discharge Instructions: Moisten w/normal saline or sterile water; Cover wound as directed. Do not remove from wound bed. Secondary Dressing: Gauze 1 x Per Day/15 Days Discharge Instructions: As directed: dry, moistened with saline or moistened with Dakins Solution Secured With: Medipore Tape - 45M Medipore H Soft Cloth Surgical Tape, 2x2 (in/yd) 1 x Per Day/15 Days WOUND #3: - Toe Great Wound Laterality: Dorsal, Left Primary Dressing: Prisma 4.34 (in) 1 x Per Day/15 Days Discharge Instructions: Moisten w/normal saline or sterile water; Cover wound as directed. Do not remove from wound bed. Secondary Dressing: Gauze 1 x Per Day/15 Days Discharge Instructions: As directed: dry, moistened with saline or moistened with Dakins Solution Secured With: Medipore Tape - 45M Medipore H Soft Cloth Surgical Tape, 2x2 (in/yd) 1 x Per Day/15 Days 1. I am going to suggest that we have the patient continue to monitor for any evidence of infection going forward I do believe that he is really doing quite well though and I think we are on the right track I am hopeful that with the completion of hyperbarics he will be closed if not completely closed as far as the wounds are concerned and we will be able to discharge him shortly. Nonetheless were seeing good improvements right now. 2. I am also can recommend that we have the patient continue with the silver collagen at both wound locations I think this could be better than Hydrofera Blue at this point to feel the spaces he seems to be  doing well in that regard. We will see patient back for reevaluation in 1 week here in the clinic. If anything worsens or changes patient will contact our office for additional recommendations. Electronic Signature(s) Signed: 05/17/2022 1:24:54 PM By: Lenda Kelp PA-C Entered By: Lenda Kelp on 05/17/2022 13:24:54 Gene Brown ([id number]) -------------------------------------------------------------------------------- SuperBill Details Patient Name: Gene Locus  V. Date of Service: 05/17/2022 Medical Record Number: [id number] Patient Account Number: [id number] Date of Birth/Sex: [birth year] (71 y.o. M) Treating RN: Yevonne Pax Primary Care Provider: Barbette Reichmann Other Clinician: Referring Provider: Barbette Reichmann Treating Provider/Extender: Rowan Blase in Treatment: 18 Diagnosis Coding ICD-10 Codes Code Description [phone] Other chronic osteomyelitis, left ankle and foot E11.621 Type 2 diabetes mellitus with foot ulcer 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osed E11.42 Type 2 diabetes mellitus with diabetic polyneuropathy I10 Essential (primary) hypertension Facility Procedures CPT4 Code: 18841660 Description: 11042 - DEB SUBQ TISSUE 20 SQ CM/< Modifier: Quantity: 1 CPT4 Code: Description: ICD-10 Diagnosis Description 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 Modifier: osed Quantity: Physician Procedures CPT4 Code: [id number] Description: 11042 - WC PHYS SUBQ TISS 20 SQ CM Modifier: Quantity: 1 CPT4 Code: Description: ICD-10 Diagnosis Description 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 Modifier: osed Quantity: Electronic Signature(s) Signed: 05/17/2022 1:26:10 PM By: Lenda Kelp PA-C Entered By: Lenda Kelp on 05/17/2022 13:26:09

## 2022-05-17 NOTE — Progress Notes (Signed)
ARLYN, BUERKLE ([id number]) Visit Report for 05/17/2022 Arrival Information Details Patient Name: Gene Brown, Gene Brown Date of Service: 05/17/2022 8:00 AM Medical Record Number: [id number] Patient Account Number: [id number] Date of Birth/Sex: [birth year] (71 y.o. M) Treating RN: Cornell Barman Primary Care Ryen Heitmeyer: Tracie Harrier Other Clinician: Jacqulyn Bath Referring Asa Baudoin: Tracie Harrier Treating Charlette Hennings/Extender: Skipper Cliche in Treatment: 18 Visit Information History Since Last Visit Added or deleted any medications: No Patient Arrived: Ambulatory Any new allergies or adverse reactions: No Arrival Time: 08:10 Had a fall or experienced change in No Accompanied By: self activities of daily living that may affect Transfer Assistance: None risk of falls: Patient Identification Verified: Yes Signs or symptoms of abuse/neglect since last visito No Secondary Verification Process Completed: Yes Hospitalized since last visit: No Patient Requires Transmission-Based No Implantable device outside of the clinic excluding No Precautions: cellular tissue based products placed in the center Patient Has Alerts: Yes since last visit: Patient Alerts: Patient on Blood Has Dressing in Place as Prescribed: Yes Thinner Has Footwear/Offloading in Place as Prescribed: Yes 01/07/2022 [place]>220 on Left: Surgical Shoe with Pressure left Relief Insole Pain Present Now: No Electronic Signature(s) Signed: 05/17/2022 11:23:07 AM By: Enedina Finner RCP, RRT, CHT Entered By: Enedina Finner on 05/17/2022 08:39:00 Gene Brown ([id number]) -------------------------------------------------------------------------------- Encounter Discharge Information Details Patient Name: Gene Brown Date of Service: 05/17/2022 8:00 AM Medical Record Number: [id number] Patient Account Number: [id number] Date of Birth/Sex: February 23, 1951 (71 y.o. M) Treating RN: Cornell Barman Primary  Care Shigeru Lampert: Tracie Harrier Other Clinician: Jacqulyn Bath Referring Sharmain Lastra: Tracie Harrier Treating Dalissa Lovin/Extender: Skipper Cliche in Treatment: 18 Encounter Discharge Information Items Discharge Condition: Stable Ambulatory Status: Ambulatory Discharge Destination: Home Transportation: Private Auto Accompanied By: self Schedule Follow-up Appointment: Yes Clinical Summary of Care: Notes Patient has an HBO treatment scheduled on 1011/23 at 08:00 am. Electronic Signature(s) Signed: 05/17/2022 11:23:07 AM By: Enedina Finner RCP, RRT, CHT Entered By: Enedina Finner on 05/17/2022 11:22:38 Gene Brown ([id number]) -------------------------------------------------------------------------------- Vitals Details Patient Name: Gene Brown Date of Service: 05/17/2022 8:00 AM Medical Record Number: [id number] Patient Account Number: [id number] Date of Birth/Sex: [birth year] (71 y.o. M) Treating RN: Cornell Barman Primary Care Conn Trombetta: Tracie Harrier Other Clinician: Jacqulyn Bath Referring Kasson Lamere: Tracie Harrier Treating Jennavecia Schwier/Extender: Skipper Cliche in Treatment: 18 Vital Signs Time Taken: 08:22 Temperature (F): 97.6 Height (in): 68 Pulse (bpm): 60 Weight (lbs): 217 Respiratory Rate (breaths/min): 18 Body Mass Index (BMI): 33 Blood Pressure (mmHg): 132/72 Capillary Blood Glucose (mg/dl): 77 Reference Range: 80 - 120 mg / dl Electronic Signature(s) Signed: 05/17/2022 11:23:07 AM By: Enedina Finner RCP, RRT, CHT Entered By: Enedina Finner on 05/17/2022 08:43:41

## 2022-05-18 ENCOUNTER — Encounter (HOSPITAL_BASED_OUTPATIENT_CLINIC_OR_DEPARTMENT_OTHER): Payer: Medicare Other | Admitting: Internal Medicine

## 2022-05-18 DIAGNOSIS — M86672 Other chronic osteomyelitis, left ankle and foot: Secondary | ICD-10-CM | POA: Diagnosis not present

## 2022-05-18 DIAGNOSIS — L97522 Non-pressure chronic ulcer of other part of left foot with fat layer exposed: Secondary | ICD-10-CM | POA: Diagnosis not present

## 2022-05-18 DIAGNOSIS — E1169 Type 2 diabetes mellitus with other specified complication: Secondary | ICD-10-CM | POA: Diagnosis not present

## 2022-05-18 DIAGNOSIS — E11621 Type 2 diabetes mellitus with foot ulcer: Secondary | ICD-10-CM | POA: Diagnosis not present

## 2022-05-18 LAB — GLUCOSE, CAPILLARY
Glucose-Capillary: 164 mg/dL — ABNORMAL HIGH (ref 70–99)
Glucose-Capillary: 94 mg/dL (ref 70–99)

## 2022-05-18 NOTE — Progress Notes (Signed)
ANKUR, SNOWDON ([id number]) Visit Report for 05/18/2022 HBO Details Patient Name: Gene Brown, Gene Brown Date of Service: 05/18/2022 8:00 AM Medical Record Number: [id number] Patient Account Number: [id number] Date of Birth/Sex: February 07, 1951 (71 y.o. 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 HBO Treatment Details Treatment Number: 13 Patient Type: Outpatient Chamber Type: Monoplace Chamber Serial #: [id number] Treatment Protocol: 2.0 ATA with 90 minutes oxygen, and no air breaks Treatment Details Compression Rate Down: 1.5 psi / minute De-Compression Rate Up: 1.5 psi / minute Compress Tx Pressure Air breaks and breathing periods Decompress Decompress Begins Reached (leave unused spaces blank) Begins Ends Chamber Pressure (ATA) 1 2 - - - - - - 2 1 Clock Time (24 hr) 08:22 08:34 - - - - - - 10:04 10:14 Treatment Length: 112 (minutes) Treatment Segments: 4 Vital Signs Capillary Blood Glucose Reference Range: 80 - 120 mg / dl HBO Diabetic Blood Glucose Intervention Range: <131 mg/dl or >249 mg/dl Time Vitals Blood Respiratory Capillary Blood Glucose Pulse Action Type: Pulse: Temperature: Taken: Pressure: Rate: Glucose (mg/dl): Meter #: Oximetry (%) Taken: Pre 08:04 118/70 72 18 97.8 164 1 none per protocol Post 10:39 130/80 60 18 97.6 94 1 none Treatment Response Treatment Toleration: Well Treatment Completion Treatment Completed without Adverse Event Status: HBO Attestation I certify that I supervised this HBO treatment in accordance with Medicare guidelines. A trained emergency response team is  readily Yes available per hospital policies and procedures. Continue HBOT as ordered. Yes Electronic Signature(s) Signed: 05/18/2022 3:09:38 PM By: Kalman Shan DO Previous Signature: 05/18/2022 11:29:35 AM Version By: Enedina Finner RCP, RRT, CHT Entered By: Kalman Shan on 05/18/2022 13:24:35 Gene Brown ([id number]) -------------------------------------------------------------------------------- HBO Safety Checklist Details Patient Name: Gene Brown Date of Service: 05/18/2022 8:00 AM Medical Record Number: [id number] Patient Account Number: [id number] Date of Birth/Sex: 1950-08-19 (71 y.o. M) Treating RN: Carlene Coria Primary Care Dajah Fischman: Tracie Harrier Other Clinician: Jacqulyn Bath Referring Chezney Huether: Tracie Harrier Treating Preciosa Bundrick/Extender: Yaakov Guthrie in Treatment: 18 HBO Safety Checklist Items Safety Checklist Consent Form Signed Patient voided / foley secured and emptied When did you last eato 07:00 am Last dose of injectable or oral agent 07:00 am Ostomy pouch emptied and vented if applicable NA All implantable devices assessed, documented and approved NA Intravenous access site secured and place NA Valuables secured Linens and cotton and cotton/polyester blend (less than 51% polyester) Personal oil-based products / skin lotions / body lotions removed Wigs or hairpieces removed NA Smoking or tobacco materials removed NA Books / newspapers / magazines / loose paper removed NA Cologne, aftershave, perfume and deodorant removed Jewelry removed (may wrap wedding band) Make-up removed NA Hair care products removed Battery operated devices (external) removed NA Heating patches and chemical warmers removed NA Titanium eyewear removed NA Nail polish cured greater than 10 hours NA Casting material cured greater than 10 hours NA Hearing aids removed NA Loose dentures or partials removed NA Prosthetics have  been removed NA Patient demonstrates correct use of air break device (if applicable) Patient concerns have been addressed Patient grounding bracelet on and cord attached to chamber Specifics for Inpatients (complete in addition to above) Medication sheet sent with patient Intravenous medications needed or due during  therapy sent with patient Drainage tubes (e.g. nasogastric tube or chest tube secured and vented) Endotracheal or Tracheotomy tube secured Cuff deflated of air and inflated with saline Airway suctioned Electronic Signature(s) Signed: 05/18/2022 11:29:35 AM By: Aleda Grana RCP, RRT, CHT Entered By: Aleda Grana on 05/18/2022 08:53:02

## 2022-05-18 NOTE — Progress Notes (Signed)
SAVERIO, KADER ([id number]) Visit Report for 05/16/2022 HBO Details Patient Name: Gene Brown, Gene Brown Date of Service: 05/16/2022 8:00 AM Medical Record Number: [id number] Patient Account Number: [id number] Date of Birth/Sex: 11/25/1950 (71 y.o. 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 HBO Treatment Details Treatment Number: 11 Patient Type: Outpatient Chamber Type: Monoplace Chamber Serial #: [id number] Treatment Protocol: 2.0 ATA with 90 minutes oxygen, and no air breaks Treatment Details Compression Rate Down: 1.5 psi / minute De-Compression Rate Up: 1.5 psi / minute Compress Tx Pressure Air breaks and breathing periods Decompress Decompress Begins Reached (leave unused spaces blank) Begins Ends Chamber Pressure (ATA) 1 2 - - - - - - 2 1 Clock Time (24 hr) 08:20 08:32 - - - - - - 10:03 10:13 Treatment Length: 113 (minutes) Treatment Segments: 4 Vital Signs Capillary Blood Glucose Reference Range: 80 - 120 mg / dl HBO Diabetic Blood Glucose Intervention Range: <131 mg/dl or >249 mg/dl Time Vitals Blood Respiratory Capillary Blood Glucose Pulse Action Type: Pulse: Temperature: Taken: Pressure: Rate: Glucose (mg/dl): Meter #: Oximetry (%) Taken: Pre 08:06 120/72 72 18 97.7 165 1 none per protocol Post 10:30 132/78 60 18 97.8 135 1 none per protocol Treatment Response Treatment Toleration: Well Treatment Completion Treatment Completed without Adverse Event Status: Electronic Signature(s) Signed: 05/16/2022 11:01:26 AM By: Enedina Finner RCP, RRT, CHT Signed: 05/17/2022 6:10:21 PM By: Worthy Keeler PA-C Entered By: Enedina Finner on 05/16/2022 10:57:59 Gene Brown ([id number]) -------------------------------------------------------------------------------- HBO Safety Checklist Details Patient Name: Gene Brown Date of Service: 05/16/2022 8:00 AM Medical Record Number: [id number] Patient Account Number: [id number] Date of Birth/Sex: 1951/05/27 (71 y.o. M) Treating RN: Cornell Barman Primary Care Jeff Frieden: Tracie Harrier Other Clinician: Jacqulyn Bath Referring Arnav Cregg: Tracie Harrier Treating Rosibel Giacobbe/Extender: Skipper Cliche in Treatment: 18 HBO Safety Checklist Items Safety Checklist Consent Form Signed Patient voided / foley secured and emptied When did you last eato 07:00 am Last dose of injectable or oral agent 07:00 am Ostomy pouch emptied and vented if applicable NA All implantable devices assessed, documented and approved NA Intravenous access site secured and place NA Valuables secured Linens and cotton and cotton/polyester blend (less than 51% polyester) Personal oil-based products / skin lotions / body lotions removed Wigs or hairpieces removed NA Smoking or tobacco materials removed NA Books / newspapers / magazines / loose paper removed NA Cologne, aftershave, perfume and deodorant removed Jewelry removed (may wrap wedding band) Make-up removed NA Hair care products removed Battery operated devices (external) removed NA Heating patches and chemical warmers removed NA Titanium eyewear removed NA Nail polish cured greater than 10 hours NA Casting material cured greater than 10 hours NA Hearing aids removed NA Loose dentures or partials removed NA Prosthetics have been removed NA Patient demonstrates correct use of air break device (if applicable) Patient concerns have been addressed Patient grounding bracelet on and cord attached to chamber Specifics for Inpatients (complete in addition  to above) Medication sheet sent with patient Intravenous medications needed or due during therapy sent with patient Drainage tubes (e.g. nasogastric tube or chest tube secured and vented) Endotracheal or Tracheotomy tube secured Cuff deflated of air and inflated with saline Airway suctioned Electronic Signature(s)  Signed: 05/16/2022 11:01:26 AM By: Enedina Finner RCP, RRT, CHT Entered By: Enedina Finner on 05/16/2022 08:31:11

## 2022-05-18 NOTE — Progress Notes (Signed)
Gene, Brown ([id number]) Visit Report for 05/18/2022 Arrival Information Details Patient Name: Gene Brown, Gene Brown Date of Service: 05/18/2022 8:00 AM Medical Record Number: [id number] Patient Account Number: [id number] Date of Birth/Sex: 09-Feb-1951 (71 y.o. M) Treating RN: Carlene Coria Primary Care Prajna Vanderpool: Tracie Harrier Other Clinician: Jacqulyn Bath Referring Kennedy Bohanon: Tracie Harrier Treating Boluwatife Mutchler/Extender: Yaakov Guthrie in Treatment: 18 Visit Information History Since Last Visit Added or deleted any medications: No Patient Arrived: Ambulatory Any new allergies or adverse reactions: No Arrival Time: 07:50 Had a fall or experienced change in No Accompanied By: self activities of daily living that may affect Transfer Assistance: None risk of falls: Patient Identification Verified: Yes Signs or symptoms of abuse/neglect since last visito No Secondary Verification Process Completed: Yes Hospitalized since last visit: No Patient Requires Transmission-Based No Implantable device outside of the clinic excluding No Precautions: cellular tissue based products placed in the center Patient Has Alerts: Yes since last visit: Patient Alerts: Patient on Blood Has Dressing in Place as Prescribed: Yes Thinner Has Footwear/Offloading in Place as Prescribed: Yes 01/07/2022 [place]>220 on Left: Surgical Shoe with Pressure left Relief Insole Pain Present Now: No Electronic Signature(s) Signed: 05/18/2022 11:29:35 AM By: Enedina Finner RCP, RRT, CHT Entered By: Enedina Finner on 05/18/2022 08:51:13 Gene Brown ([id number]) -------------------------------------------------------------------------------- Encounter Discharge Information Details Patient Name: Gene Brown Date of Service: 05/18/2022 8:00 AM Medical Record Number: [id number] Patient Account Number: [id number] Date of Birth/Sex: [birth year] (71 y.o. M) Treating RN: Carlene Coria Primary Care Dewight Catino: Tracie Harrier Other Clinician: Jacqulyn Bath Referring Kayde Atkerson: Tracie Harrier Treating Shary Lamos/Extender: Yaakov Guthrie in Treatment: 77 Encounter Discharge Information Items Discharge Condition: Stable Ambulatory Status: Cane Discharge Destination: Home Transportation: Private Auto Accompanied By: self Schedule Follow-up Appointment: Yes Clinical Summary of Care: Notes Patient has an HBO treatment scheduled on 05/19/22 at 08:00 am. Electronic Signature(s) Signed: 05/18/2022 11:29:35 AM By: Enedina Finner RCP, RRT, CHT Entered By: Enedina Finner on 05/18/2022 11:27:40 Gene Brown ([id number]) -------------------------------------------------------------------------------- Vitals Details Patient Name: Gene Brown Date of Service: 05/18/2022 8:00 AM Medical Record Number: [id number] Patient Account Number: [id number] Date of Birth/Sex: 03-Sep-1950 (71 y.o. M) Treating RN: Carlene Coria Primary Care Otha Rickles: Tracie Harrier Other Clinician: Jacqulyn Bath Referring Chanoch Mccleery: Tracie Harrier Treating Melodi Happel/Extender: Yaakov Guthrie in Treatment: 18 Vital Signs Time Taken: 08:04 Temperature (F): 97.8 Height (in): 68 Pulse (bpm): 72 Weight (lbs): 217 Respiratory Rate (breaths/min): 18 Body Mass Index (BMI): 33 Blood Pressure (mmHg): 118/70 Capillary Blood Glucose (mg/dl): 164 Reference Range: 80 - 120 mg / dl Electronic Signature(s) Signed: 05/18/2022 11:29:35 AM By: Enedina Finner RCP, RRT, CHT Entered By: Stark Jock, Amado Nash on 05/18/2022 08:51:49

## 2022-05-18 NOTE — Progress Notes (Signed)
COLTER, MAGOWAN ([id number]) Visit Report for 05/17/2022 HBO Details Patient Name: Gene Brown, Gene Brown Date of Service: 05/17/2022 8:00 AM Medical Record Number: [id number] Patient Account Number: [id number] Date of Birth/Sex: Nov 01, 1950 (71 y.o. 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 HBO Treatment Details Treatment Number: 12 Patient Type: Outpatient Chamber Type: Monoplace Chamber Serial #: [id number] Treatment Protocol: 2.0 ATA with 90 minutes oxygen, and no air breaks Treatment Details Compression Rate Down: 1.5 psi / minute De-Compression Rate Up: 1.5 psi / minute Compress Tx Pressure Air breaks and breathing periods Decompress Decompress Begins Reached (leave unused spaces blank) Begins Ends Chamber Pressure (ATA) 1 2 - - - - - - 2 1 Clock Time (24 hr) 08:57 09:09 - - - - - - 10:39 10:49 Treatment Length: 112 (minutes) Treatment Segments: 4 Vital Signs Capillary Blood Glucose Reference Range: 80 - 120 mg / dl HBO Diabetic Blood Glucose Intervention Range: <131 mg/dl or >249 mg/dl Time Capillary Blood Glucose Pulse Blood Respiratory Action Type: Vitals Pulse: Temperature: Glucose Meter Oximetry Pressure: Rate: Taken: Taken: (mg/dl): #: (%) Gave Ensure x2 per Jeri Cos, Pre 08:22 132/72 60 18 97.6 77 1 PA-C none per protocol; PA informed; Pre 08:55 150 1 HBO started Post 11:16 172/78 67 18 97.6 169 1 none per protocol Treatment Response Treatment Toleration: Well Treatment Completion Treatment Completed without Adverse Event Status: Electronic Signature(s) Signed: 05/17/2022 11:23:07  AM By: Enedina Finner RCP, RRT, CHT Signed: 05/17/2022 6:10:21 PM By: Worthy Keeler PA-C Entered By: Enedina Finner on 05/17/2022 11:21:40 Gene Brown ([id number]) -------------------------------------------------------------------------------- HBO Safety Checklist Details Patient Name: Gene Brown Date of Service: 05/17/2022 8:00 AM Medical Record Number: [id number] Patient Account Number: [id number] Date of Birth/Sex: 11-15-1950 (71 y.o. M) Treating RN: Cornell Barman Primary Care Phyliss Hulick: Tracie Harrier Other Clinician: Jacqulyn Bath Referring Melda Mermelstein: Tracie Harrier Treating Claramae Rigdon/Extender: Skipper Cliche in Treatment: 18 HBO Safety Checklist Items Safety Checklist Consent Form Signed Patient voided / foley secured and emptied When did you last eato 07:00 Last dose of injectable or oral agent 007:00 Ostomy pouch emptied and vented if applicable NA All implantable devices assessed, documented and approved NA Intravenous access site secured and place NA Valuables secured Linens and cotton and cotton/polyester blend (less than 51% polyester) Personal oil-based products / skin lotions / body lotions removed Wigs or hairpieces removed NA Smoking or tobacco materials removed NA Books / newspapers / magazines / loose paper removed NA Cologne, aftershave, perfume and deodorant removed Jewelry removed (may wrap wedding band) Make-up removed NA Hair care products removed Battery operated devices (external) removed NA Heating patches and chemical warmers removed NA Titanium eyewear removed NA Nail polish cured greater than 10 hours NA Casting material cured greater than 10 hours NA Hearing aids removed NA Loose dentures or partials removed NA Prosthetics have been removed NA Patient demonstrates correct use of air break device (if applicable) Patient concerns have been addressed Patient grounding bracelet on and cord  attached to chamber Specifics for Inpatients (complete in addition to above) Medication sheet sent with patient Intravenous medications needed or due during therapy sent with patient Drainage tubes (e.g. nasogastric tube or chest tube secured and vented) Endotracheal or Tracheotomy tube  secured Cuff deflated of air and inflated with saline Airway suctioned Electronic Signature(s) Signed: 05/17/2022 11:23:07 AM By: Aleda Grana RCP, RRT, CHT Entered By: Aleda Grana on 05/17/2022 08:45:13

## 2022-05-19 ENCOUNTER — Encounter: Payer: Medicare Other | Admitting: Physician Assistant

## 2022-05-19 DIAGNOSIS — E1169 Type 2 diabetes mellitus with other specified complication: Secondary | ICD-10-CM | POA: Diagnosis not present

## 2022-05-19 LAB — GLUCOSE, CAPILLARY
Glucose-Capillary: 168 mg/dL — ABNORMAL HIGH (ref 70–99)
Glucose-Capillary: 95 mg/dL (ref 70–99)

## 2022-05-19 NOTE — Progress Notes (Signed)
JAPHETH, DIEKMAN ([id number]) Visit Report for 05/19/2022 Arrival Information Details Patient Name: Gene Brown, Gene Brown Date of Service: 05/19/2022 8:00 AM Medical Record Number: [id number] Patient Account Number: [id number] Date of Birth/Sex: 1951/05/03 (71 y.o. M) Treating RN: Carlene Coria Primary Care Karima Carrell: Tracie Harrier Other Clinician: Jacqulyn Bath Referring Jora Galluzzo: Tracie Harrier Treating Jaretzi Droz/Extender: Skipper Cliche in Treatment: 18 Visit Information History Since Last Visit Added or deleted any medications: No Patient Arrived: Cane Any new allergies or adverse reactions: No Arrival Time: 07:50 Had a fall or experienced change in No Accompanied By: self activities of daily living that may affect Transfer Assistance: None risk of falls: Patient Identification Verified: Yes Signs or symptoms of abuse/neglect since last visito No Secondary Verification Process Completed: Yes Hospitalized since last visit: No Patient Requires Transmission-Based No Implantable device outside of the clinic excluding No Precautions: cellular tissue based products placed in the center Patient Has Alerts: Yes since last visit: Patient Alerts: Patient on Blood Has Dressing in Place as Prescribed: Yes Thinner Has Footwear/Offloading in Place as Prescribed: Yes 01/07/2022 [place]>220 on Left: Surgical Shoe with Pressure left Relief Insole Pain Present Now: No Electronic Signature(s) Signed: 05/19/2022 11:46:15 AM By: Enedina Finner RCP, RRT, CHT Entered By: Enedina Finner on 05/19/2022 08:37:15 Gene Brown ([id number]) -------------------------------------------------------------------------------- Encounter Discharge Information Details Patient Name: Gene Brown Date of Service: 05/19/2022 8:00 AM Medical Record Number: [id number] Patient Account Number: [id number] Date of Birth/Sex: 17-Mar-1951 (71 y.o. M) Treating RN: Carlene Coria Primary Care  Zillah Alexie: Tracie Harrier Other Clinician: Jacqulyn Bath Referring Reigna Ruperto: Tracie Harrier Treating Dianca Owensby/Extender: Skipper Cliche in Treatment: 18 Encounter Discharge Information Items Discharge Condition: Stable Ambulatory Status: Cane Discharge Destination: Home Transportation: Private Auto Accompanied By: self Schedule Follow-up Appointment: Yes Clinical Summary of Care: Notes Patient has an HBO treatment scheduled on 05/20/22 at 08:00 am. Electronic Signature(s) Signed: 05/19/2022 11:46:15 AM By: Enedina Finner RCP, RRT, CHT Entered By: Enedina Finner on 05/19/2022 11:42:53 Gene Brown ([id number]) -------------------------------------------------------------------------------- Vitals Details Patient Name: Gene Brown Date of Service: 05/19/2022 8:00 AM Medical Record Number: [id number] Patient Account Number: [id number] Date of Birth/Sex: 25-Aug-1950 (71 y.o. M) Treating RN: Carlene Coria Primary Care Hassie Mandt: Tracie Harrier Other Clinician: Jacqulyn Bath Referring Filiberto Wamble: Tracie Harrier Treating Arionne Iams/Extender: Skipper Cliche in Treatment: 18 Vital Signs Time Taken: 07:59 Temperature (F): 98.0 Height (in): 68 Pulse (bpm): 60 Weight (lbs): 217 Respiratory Rate (breaths/min): 18 Body Mass Index (BMI): 33 Blood Pressure (mmHg): 122/70 Capillary Blood Glucose (mg/dl): 168 Reference Range: 80 - 120 mg / dl Electronic Signature(s) Signed: 05/19/2022 11:46:15 AM By: Enedina Finner RCP, RRT, CHT Entered By: Enedina Finner on 05/19/2022 08:37:57

## 2022-05-19 NOTE — Progress Notes (Signed)
Gene Brown, Gene Brown ([id number]) Visit Report for 05/19/2022 HBO Details Patient Name: Gene Brown, Gene Brown Date of Service: 05/19/2022 8:00 AM Medical Record Number: [id number] Patient Account Number: [id number] Date of Birth/Sex: 10-14-1950 (71 y.o. 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 HBO Treatment Details Treatment Number: 14 Patient Type: Outpatient Chamber Type: Monoplace Chamber Serial #: [id number] Treatment Protocol: 2.0 ATA with 90 minutes oxygen, and no air breaks Treatment Details Compression Rate Down: 1.5 psi / minute De-Compression Rate Up: 1.5 psi / minute Compress Tx Pressure Air breaks and breathing periods Decompress Decompress Begins Reached (leave unused spaces blank) Begins Ends Chamber Pressure (ATA) 1 2 - - - - - - 2 1 Clock Time (24 hr) 08:21 08:33 - - - - - - 10:03 10:13 Treatment Length: 112 (minutes) Treatment Segments: 4 Vital Signs Capillary Blood Glucose Reference Range: 80 - 120 mg / dl HBO Diabetic Blood Glucose Intervention Range: <131 mg/dl or >242 mg/dl Capillary Time Glucose Pulse Blood Respiratory Blood Action Type: Vitals Pulse: Temperature: Meter Oximetry Pressure: Rate: Glucose Taken: Taken: #: (%) (mg/dl): Pre 35:36 144/31 60 18 98 168 1 none per protocol Gave Ensure per protocol; patient Post 10:36 120/72 66 18 98.1 95 1 released to home Treatment Response Treatment Toleration: Well Treatment Completion Treatment Completed without Adverse Event Status: Electronic Signature(s) Signed: 05/19/2022 11:46:15 AM By: Aleda Grana RCP, RRT,  CHT Signed: 05/19/2022 5:03:23 PM By: Lenda Kelp PA-C Entered By: Aleda Grana on 05/19/2022 11:40:43 Gene Brown ([id number]) -------------------------------------------------------------------------------- HBO Safety Checklist Details Patient Name: Gene Brown Date of Service: 05/19/2022 8:00 AM Medical Record Number: [id number] Patient Account Number: [id number] Date of Birth/Sex: [birth year] (71 y.o. M) Treating RN: Yevonne Pax Primary Care Caroll Weinheimer: Barbette Reichmann Other Clinician: Izetta Dakin Referring Rosalie Buenaventura: Barbette Reichmann Treating Donyel Castagnola/Extender: Rowan Blase in Treatment: 18 HBO Safety Checklist Items Safety Checklist Consent Form Signed Patient voided / foley secured and emptied When did you last eato 07:00 am Last dose of injectable or oral agent 07:00 am Ostomy pouch emptied and vented if applicable NA All implantable devices assessed, documented and approved NA Intravenous access site secured and place NA Valuables secured Linens and cotton and cotton/polyester blend (less than 51% polyester) Personal oil-based products / skin lotions / body lotions removed Wigs or hairpieces removed NA Smoking or tobacco materials removed NA Books / newspapers / magazines / loose paper removed NA Cologne, aftershave, perfume and deodorant removed Jewelry removed (may wrap wedding band) Make-up removed NA Hair care products removed Battery operated devices (external) removed NA Heating patches and chemical warmers removed NA Titanium eyewear removed NA Nail polish cured greater than 10 hours NA Casting material cured greater than 10 hours NA Hearing aids removed NA Loose dentures or partials removed NA Prosthetics have been removed NA Patient demonstrates correct use of air break device (if applicable) Patient concerns have been addressed Patient grounding bracelet on and cord attached to chamber Specifics for  Inpatients (complete in addition to above) Medication sheet sent with patient Intravenous medications needed or due during therapy sent with patient Drainage tubes (e.g. nasogastric tube or chest tube secured and vented) Endotracheal or Tracheotomy tube secured Cuff deflated of air and inflated with  saline Airway suctioned Electronic Signature(s) Signed: 05/19/2022 11:46:15 AM By: Enedina Finner RCP, RRT, CHT Entered By: Enedina Finner on 05/19/2022 08:39:02

## 2022-05-20 ENCOUNTER — Encounter: Payer: Medicare Other | Admitting: Physician Assistant

## 2022-05-20 DIAGNOSIS — E1169 Type 2 diabetes mellitus with other specified complication: Secondary | ICD-10-CM | POA: Diagnosis not present

## 2022-05-20 LAB — GLUCOSE, CAPILLARY
Glucose-Capillary: 173 mg/dL — ABNORMAL HIGH (ref 70–99)
Glucose-Capillary: 103 mg/dL — ABNORMAL HIGH (ref 70–99)

## 2022-05-20 NOTE — Progress Notes (Signed)
HART, HAAS ([id number]) Visit Report for 05/17/2022 Arrival Information Details Patient Name: Gene, Brown Date of Service: 05/17/2022 11:00 AM Medical Record Number: [id number] Patient Account Number: [id number] Date of Birth/Sex: [birth year] (71 y.o. M) Treating RN: Yevonne Pax Primary Care Hester Forget: Barbette Reichmann Other Clinician: Referring Ruvim Risko: Barbette Reichmann Treating Laquisha Northcraft/Extender: Rowan Blase in Treatment: 18 Visit Information History Since Last Visit All ordered tests and consults were completed: No Patient Arrived: Ambulatory Added or deleted any medications: No Arrival Time: 11:13 Any new allergies or adverse reactions: No Accompanied By: self Had a fall or experienced change in No Transfer Assistance: None activities of daily living that may affect Patient Identification Verified: Yes risk of falls: Secondary Verification Process Completed: Yes Signs or symptoms of abuse/neglect since last visito No Patient Requires Transmission-Based No Hospitalized since last visit: No Precautions: Implantable device outside of the clinic excluding No Patient Has Alerts: Yes cellular tissue based products placed in the center Patient Alerts: Patient on Blood Thinner since last visit: 01/07/2022 [place]>220 on Has Dressing in Place as Prescribed: Yes left Pain Present Now: No Electronic Signature(s) Signed: 05/20/2022 12:25:01 PM By: Yevonne Pax RN Entered By: Yevonne Pax on 05/17/2022 11:14:37 Gene Brown ([id number]) -------------------------------------------------------------------------------- Clinic Level of Care Assessment Details Patient Name: Gene Brown Date of Service: 05/17/2022 11:00 AM Medical Record Number: [id number] Patient Account Number: [id number] Date of Birth/Sex: 1951/08/07 (71 y.o. M) Treating RN: Yevonne Pax Primary Care Gimena Buick: Barbette Reichmann Other Clinician: Referring Ellery Tash: Barbette Reichmann Treating  Jerid Catherman/Extender: Rowan Blase in Treatment: 18 Clinic Level of Care Assessment Items TOOL 1 Quantity Score []  - Use when EandM and Procedure is performed on INITIAL visit 0 ASSESSMENTS - Nursing Assessment / Reassessment []  - General Physical Exam (combine w/ comprehensive assessment (listed just below) when performed on new 0 pt. evals) []  - 0 Comprehensive Assessment (HX, ROS, Risk Assessments, Wounds Hx, etc.) ASSESSMENTS - Wound and Skin Assessment / Reassessment []  - Dermatologic / Skin Assessment (not related to wound area) 0 ASSESSMENTS - Ostomy and/or Continence Assessment and Care []  - Incontinence Assessment and Management 0 []  - 0 Ostomy Care Assessment and Management (repouching, etc.) PROCESS - Coordination of Care []  - Simple Patient / Family Education for ongoing care 0 []  - 0 Complex (extensive) Patient / Family Education for ongoing care []  - 0 Staff obtains , Records, Test Results / Process Orders []  - 0 Staff telephones HHA, Nursing Homes / Clarify orders / etc []  - 0 Routine Transfer to another Facility (non-emergent condition) []  - 0 Routine Hospital Admission (non-emergent condition) []  - 0 New Admissions / / Ordering NPWT, Apligraf, etc. []  - 0 Emergency Hospital Admission (emergent condition) PROCESS - Special Needs []  - Pediatric / Minor Patient Management 0 []  - 0 Isolation Patient Management []  - 0 Hearing / Language / Visual special needs []  - 0 Assessment of Community assistance (transportation, D/C planning, etc.) []  - 0 Additional assistance / Altered mentation []  - 0 Support Surface(s) Assessment (bed, cushion, seat, etc.) INTERVENTIONS - Miscellaneous []  - External ear exam 0 []  - 0 Patient Transfer (multiple staff / / Similar devices) []  - 0 Simple Staple / Suture removal (25 or less) []  - 0 Complex Staple / Suture removal (26 or more) []  - 0 Hypo/Hyperglycemic Management (do not  check if billed separately) []  - 0 Ankle / Brachial Index (ABI) - do not check if billed separately Has the patient been seen at the hospital  within the last three years: Yes Total Score: 0 Level Of Care: ____ Gene Brown ([id number]) Electronic Signature(s) Signed: 05/20/2022 12:25:01 PM By: Yevonne Pax RN Entered By: Yevonne Pax on 05/17/2022 12:38:04 Gene Brown ([id number]) -------------------------------------------------------------------------------- Encounter Discharge Information Details Patient Name: Gene Brown Date of Service: 05/17/2022 11:00 AM Medical Record Number: [id number] Patient Account Number: [id number] Date of Birth/Sex: 1951-04-05 (71 y.o. M) Treating RN: Yevonne Pax Primary Care Zasha Belleau: Barbette Reichmann Other Clinician: Referring Thena Devora: Barbette Reichmann Treating Gibson Lad/Extender: Rowan Blase in Treatment: 18 Encounter Discharge Information Items Post Procedure Vitals Discharge Condition: Stable Temperature (F): 97.6 Ambulatory Status: Ambulatory Pulse (bpm): 67 Discharge Destination: Home Respiratory Rate (breaths/min): 18 Transportation: Private Auto Blood Pressure (mmHg): 172/78 Accompanied By: self Schedule Follow-up Appointment: Yes Clinical Summary of Care: Electronic Signature(s) Signed: 05/20/2022 12:25:01 PM By: Yevonne Pax RN Entered By: Yevonne Pax on 05/17/2022 12:39:14 Gene Brown ([id number]) -------------------------------------------------------------------------------- Lower Extremity Assessment Details Patient Name: Gene Brown Date of Service: 05/17/2022 11:00 AM Medical Record Number: [id number] Patient Account Number: [id number] Date of Birth/Sex: [birth year] (71 y.o. M) Treating RN: Yevonne Pax Primary Care Lonisha Bobby: Barbette Reichmann Other Clinician: Referring Drake Landing: Barbette Reichmann Treating Shaune Westfall/Extender: Rowan Blase in Treatment: 18 Edema Assessment Assessed:  [Left: No] [Right: No] Edema: [Left: Ye] [Right: s] Calf Left: Right: Point of Measurement: 28 cm From Medial Instep 43 cm Ankle Left: Right: Point of Measurement: 11 cm From Medial Instep 28 cm Vascular Assessment Pulses: Dorsalis Pedis Palpable: [Left:Yes] Electronic Signature(s) Signed: 05/20/2022 12:25:01 PM By: Yevonne Pax RN Entered By: Yevonne Pax on 05/17/2022 11:22:04 Gene Brown ([id number]) -------------------------------------------------------------------------------- Multi Wound Chart Details Patient Name: Gene Brown Date of Service: 05/17/2022 11:00 AM Medical Record Number: [id number] Patient Account Number: [id number] Date of Birth/Sex: Apr 30, 1951 (72 y.o. 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] [3:Extremity] Comorbid History: [2:Cataracts, Sickle Cell Disease, Sleep Cataracts, Sickle Cell Disease, Sleep N/A Apnea, Hypertension, Type II Diabetes, Gout, Osteoarthritis] [3:Apnea, Hypertension, Type II Diabetes, Gout, Osteoarthritis] Date Acquired: [2:12/09/2021] [3:03/25/2022] [N/A:N/A] Weeks of Treatment: [2:18] [3:3] [N/A:N/A] Wound Status: [2:Open] [3:Open] [N/A:N/A] Wound Recurrence: [2:No] [3:No] [N/A:N/A] Measurements L x W x D (cm) [2:0x0x0] [3:0.3x0.2x0.3] [N/A:N/A] Area (cm) : [2:0] [3:0.047] [N/A:N/A] Volume (cm) : [2:0] [3:0.014] [N/A:N/A] % Reduction in Area: [2:100.00%] [3:50.00%] [N/A:N/A] % Reduction in Volume: [2:100.00%] [3:81.30%] [N/A:N/A] Classification: [2:Grade 3] [3:Grade 3]  [N/A:N/A] Exudate Amount: [2:Medium] [3:None Present] [N/A:N/A] Exudate Type: [2:Serosanguineous] [3:N/A] [N/A:N/A] Exudate Color: [2:red, brown] [3:N/A] [N/A:N/A] Wound Margin: [2:Flat and Intact] [3:Distinct, outline attached] [N/A:N/A] Granulation Amount: [2:Large (67-100%)] [3:None Present (0%)] [N/A:N/A] Granulation Quality: [2:Pink, Pale] [3:N/A] [N/A:N/A] Necrotic Amount: [2:Small (1-33%)] [3:None Present (0%)] [N/A:N/A] Exposed Structures: [2:Fat Layer (Subcutaneous Tissue): Fat Layer (Subcutaneous Tissue): N/A Yes Fascia: No Tendon: No Muscle: No Joint: No Bone: No None] [3:Yes Large (67-100%)] [N/A:N/A] Treatment Notes Electronic Signature(s) Signed: 05/20/2022 12:25:01 PM By: Yevonne Pax RN Entered By: Yevonne Pax on 05/17/2022 11:22:24 Gene Brown ([id number]) -------------------------------------------------------------------------------- Multi-Disciplinary Care Plan Details Patient Name: Gene Brown Date of Service: 05/17/2022 11:00 AM Medical Record Number: [id number] Patient Account Number: [id number] Date of Birth/Sex: February 04, 1951 (71 y.o. M) Treating RN: Yevonne Pax Primary Care Farrie Sann: Barbette Reichmann  Other Clinician: Referring Damari Hiltz: Tracie Harrier Treating Anyia Gierke/Extender: Skipper Cliche in Treatment: 18 Active Inactive HBO Nursing Diagnoses: Anxiety related to feelings of confinement associated with the hyperbaric oxygen chamber Anxiety related to knowledge deficit of hyperbaric oxygen therapy and treatment procedures Discomfort related to temperature and humidity changes inside hyperbaric chamber Potential for barotraumas to ears, sinuses, teeth, and lungs or cerebral gas embolism related to changes in atmospheric pressure inside hyperbaric oxygen chamber Potential for oxygen toxicity seizures related to delivery of 100% oxygen at an increased atmospheric pressure Potential for pulmonary oxygen toxicity related to delivery of 100%  oxygen at an increased atmospheric pressure Goals: Barotrauma will be prevented during HBO2 Date Initiated: 05/10/2022 Target Resolution Date: 05/02/2022 Goal Status: Active Patient and/or family will be able to state/discuss factors appropriate to the management of their disease process during treatment Date Initiated: 05/10/2022 Target Resolution Date: 05/02/2022 Goal Status: Active Patient will tolerate the hyperbaric oxygen therapy treatment Date Initiated: 05/10/2022 Target Resolution Date: 05/02/2022 Goal Status: Active Patient will tolerate the internal climate of the chamber Date Initiated: 05/10/2022 Target Resolution Date: 05/02/2022 Goal Status: Active Patient/caregiver will verbalize understanding of HBO goals, rationale, procedures and potential hazards Date Initiated: 05/10/2022 Target Resolution Date: 05/02/2022 Goal Status: Active Signs and symptoms of pulmonary oxygen toxicity will be recognized and promptly addressed Date Initiated: 05/10/2022 Target Resolution Date: 05/02/2022 Goal Status: Active Signs and symptoms of seizure will be recognized and promptly addressed ; seizing patients will suffer no harm Date Initiated: 05/10/2022 Target Resolution Date: 05/02/2022 Goal Status: Active Interventions: Administer a five (5) minute air break for patient if signs and symptoms of seizure appear and notify the hyperbaric physician Administer decongestants, per physician orders, prior to HBO2 Administer the correct therapeutic gas delivery based on the patients needs and limitations, per physician order Assess and provide for patientos comfort related to the hyperbaric environment and equalization of middle ear Assess for signs and symptoms related to adverse events, including but not limited to confinement anxiety, pneumothorax, oxygen toxicity and baurotrauma Assess patient for any history of confinement anxiety Assess patient's knowledge and expectations regarding hyperbaric  medicine and provide education related to the hyperbaric environment, goals of treatment and prevention of adverse events Implement protocols to decrease risk of pneumothorax in high risk patients Notes: Electronic Signature(s) Signed: 05/20/2022 12:25:01 PM By: Carlene Coria RN Entered By: Carlene Coria on 05/17/2022 11:22:15 Gene Brown ([id number] Brown ([id number]) -------------------------------------------------------------------------------- Pain Assessment Details Patient Name: Gene Brown Date of Service: 05/17/2022 11:00 AM Medical Record Number: [id number] Patient Account Number: [id number] Date of Birth/Sex: 1951-05-15 (71 y.o. M) Treating RN: Carlene Coria Primary Care Maximillian Habibi: Tracie Harrier Other Clinician: Referring Mahagony Grieb: Tracie Harrier Treating Zerah Hilyer/Extender: Skipper Cliche in Treatment: 18 Active Problems Location of Pain Severity and Description of Pain Patient Has Paino No Site Locations Pain Management and Medication Current Pain Management: Electronic Signature(s) Signed: 05/20/2022 12:25:01 PM By: Carlene Coria RN Entered By: Carlene Coria on 05/17/2022 11:23:13 Gene Brown ([id number]) -------------------------------------------------------------------------------- Patient/Caregiver Education Details Patient Name: Gene Brown Date of Service: 05/17/2022 11:00 AM Medical Record Number: [id number] Patient Account Number: [id number] Date of Birth/Gender: [birth year] (71 y.o. M) Treating RN: Carlene Coria Primary Care Physician: Tracie Harrier Other Clinician: Referring Physician: Tracie Harrier Treating Physician/Extender: Skipper Cliche in Treatment: 18 Education Assessment Education Provided To: Patient Education Topics Provided Wound/Skin Impairment: Methods: Explain/Verbal Responses: State content correctly Electronic Signature(s) Signed: 05/20/2022 12:25:01 PM By: Carlene Coria RN Entered By:  Carlene Coria  on 05/17/2022 12:38:23 Gene Brown, Gene V. ([id number]) -------------------------------------------------------------------------------- Wound Assessment Details Patient Name: Gene Brown, Gene V. Date of Service: 05/17/2022 11:00 AM Medical Record Number: [id number] Patient Account Number: [id number] Date of Birth/Sex: 02/14/1951 (71 y.o. M) Treating RN: Yevonne PaxEpps, Carrie Primary Care Lauris Keepers: Barbette ReichmannHande, Vishwanath Other Clinician: Referring Judia Arnott: Barbette ReichmannHande, Vishwanath Treating Milley Vining/Extender: Rowan BlaseStone, Hoyt Weeks in Treatment: 18 Wound Status Wound Number: 2 Primary Diabetic Wound/Ulcer of the Lower Extremity Etiology: Wound Location: Left, Plantar Toe Great Wound Open Wounding Event: Gradually Appeared Status: Date Acquired: 12/09/2021 Comorbid Cataracts, Sickle Cell Disease, Sleep Apnea, Weeks Of Treatment: 18 History: Hypertension, Type II Diabetes, Gout, Osteoarthritis Clustered Wound: No Wound Measurements Length: (cm) 0.2 Width: (cm) 0.2 Depth: (cm) 0.2 Area: (cm) 0.031 Volume: (cm) 0.006 % Reduction in Area: 89% % Reduction in Volume: 95.7% Epithelialization: None Tunneling: No Undermining: No Wound Description Classification: Grade 3 Wound Margin: Flat and Intact Exudate Amount: Medium Exudate Type: Serosanguineous Exudate Color: red, brown Foul Odor After Cleansing: No Slough/Fibrino Yes Wound Bed Granulation Amount: Large (67-100%) Exposed Structure Granulation Quality: Pink, Pale Fascia Exposed: No Necrotic Amount: Small (1-33%) Fat Layer (Subcutaneous Tissue) Exposed: Yes Tendon Exposed: No Muscle Exposed: No Joint Exposed: No Bone Exposed: No Treatment Notes Wound #2 (Toe Great) Wound Laterality: Plantar, Left Cleanser Peri-Wound Care Topical Primary Dressing Prisma 4.34 (in) Discharge Instruction: Moisten w/normal saline or sterile water; Cover wound as directed. Do not remove from wound bed. Secondary Dressing Gauze Discharge Instruction:  As directed: dry, moistened with saline or moistened with Dakins Solution Secured With Medipore Tape - 57M Medipore H Soft Cloth Surgical Tape, 2x2 (in/yd) Compression Wrap Compression Stockings Add-Ons Gene Brown, Gene V. ([id number]) Electronic Signature(s) Signed: 05/20/2022 12:25:01 PM By: Yevonne PaxEpps, Carrie RN Entered By: Yevonne PaxEpps, Carrie on 05/17/2022 11:50:33 Gene Brown, Gene V. ([id number]) -------------------------------------------------------------------------------- Wound Assessment Details Patient Name: Gene Brown, Gene V. Date of Service: 05/17/2022 11:00 AM Medical Record Number: [id number] Patient Account Number: [id number] Date of Birth/Sex: 02/09/1951 (71 y.o. M) Treating RN: Yevonne PaxEpps, Carrie Primary Care Dashauna Heymann: Barbette ReichmannHande, Vishwanath Other Clinician: Referring Lumina Gitto: Barbette ReichmannHande, Vishwanath Treating Alexey Rhoads/Extender: Rowan BlaseStone, Hoyt Weeks in Treatment: 18 Wound Status Wound Number: 3 Primary Diabetic Wound/Ulcer of the Lower Extremity Etiology: Wound Location: Left, Dorsal Toe Great Wound Open Wounding Event: Pressure Injury Status: Date Acquired: 03/25/2022 Comorbid Cataracts, Sickle Cell Disease, Sleep Apnea, Weeks Of Treatment: 3 History: Hypertension, Type II Diabetes, Gout, Osteoarthritis Clustered Wound: No Wound Measurements Length: (cm) 0.3 Width: (cm) 0.2 Depth: (cm) 0.3 Area: (cm) 0.047 Volume: (cm) 0.014 % Reduction in Area: 50% % Reduction in Volume: 81.3% Epithelialization: Large (67-100%) Tunneling: No Undermining: No Wound Description Classification: Grade 3 Wound Margin: Distinct, outline attached Exudate Amount: None Present Foul Odor After Cleansing: No Slough/Fibrino No Wound Bed Granulation Amount: None Present (0%) Exposed Structure Necrotic Amount: None Present (0%) Fat Layer (Subcutaneous Tissue) Exposed: Yes Treatment Notes Wound #3 (Toe Great) Wound Laterality: Dorsal, Left Cleanser Peri-Wound Care Topical Primary Dressing Prisma 4.34  (in) Discharge Instruction: Moisten w/normal saline or sterile water; Cover wound as directed. Do not remove from wound bed. Secondary Dressing Gauze Discharge Instruction: As directed: dry, moistened with saline or moistened with Dakins Solution Secured With Medipore Tape - 57M Medipore H Soft Cloth Surgical Tape, 2x2 (in/yd) Compression Wrap Compression Stockings Add-Ons Electronic Signature(s) Signed: 05/20/2022 12:25:01 PM By: Yevonne PaxEpps, Carrie RN Entered By: Yevonne PaxEpps, Carrie on 05/17/2022 11:21:07 Gene Brown, Gene V. ([id number]) -------------------------------------------------------------------------------- Vitals Details Patient Name: Gene Brown, Gene V. Date of Service: 05/17/2022 11:00 AM Medical Record Number: [id number] Patient Account Number: [id number] Date  of Birth/Sex: Apr 24, 1951 (71 y.o. M) Treating RN: Yevonne Pax Primary Care Carolyn Sylvia: Barbette Reichmann Other Clinician: Referring Arien Morine: Barbette Reichmann Treating Jaelin Fackler/Extender: Rowan Blase in Treatment: 18 Vital Signs Time Taken: 11:22 Temperature (F): 97.6 Height (in): 68 Pulse (bpm): 67 Weight (lbs): 217 Respiratory Rate (breaths/min): 18 Body Mass Index (BMI): 33 Blood Pressure (mmHg): 172/78 Reference Range: 80 - 120 mg / dl Electronic Signature(s) Signed: 05/20/2022 12:25:01 PM By: Yevonne Pax RN Entered By: Yevonne Pax on 05/17/2022 11:23:03

## 2022-05-20 NOTE — Progress Notes (Signed)
KENTRAVIOUS, LIPFORD ([id number]) Visit Report for 05/20/2022 Arrival Information Details Patient Name: Gene Brown, Gene Brown Date of Service: 05/20/2022 8:00 AM Medical Record Number: [id number] Patient Account Number: [id number] Date of Birth/Sex: 12-05-1950 (71 y.o. M) Treating RN: Carlene Coria Primary Care Pharrah Rottman: Tracie Harrier Other Clinician: Jacqulyn Bath Referring Shayaan Parke: Tracie Harrier Treating Jahmeer Porche/Extender: Skipper Cliche in Treatment: 19 Visit Information History Since Last Visit Added or deleted any medications: No Patient Arrived: Cane Any new allergies or adverse reactions: No Arrival Time: 07:35 Had a fall or experienced change in No Accompanied By: self activities of daily living that may affect Transfer Assistance: None risk of falls: Patient Identification Verified: Yes Signs or symptoms of abuse/neglect since last visito No Secondary Verification Process Completed: Yes Hospitalized since last visit: No Patient Requires Transmission-Based No Implantable device outside of the clinic excluding No Precautions: cellular tissue based products placed in the center Patient Has Alerts: Yes since last visit: Patient Alerts: Patient on Blood Has Dressing in Place as Prescribed: Yes Thinner Has Footwear/Offloading in Place as Prescribed: Yes 01/07/2022 [place]>220 on Left: Surgical Shoe with Pressure left Relief Insole Pain Present Now: No Electronic Signature(s) Signed: 05/20/2022 11:59:41 AM By: Enedina Finner RCP, RRT, CHT Entered By: Enedina Finner on 05/20/2022 08:28:14 Gene Brown ([id number]) -------------------------------------------------------------------------------- Encounter Discharge Information Details Patient Name: Gene Brown Date of Service: 05/20/2022 8:00 AM Medical Record Number: [id number] Patient Account Number: [id number] Date of Birth/Sex: [birth year] (71 y.o. M) Treating RN: Carlene Coria Primary Care  Ismael Treptow: Tracie Harrier Other Clinician: Jacqulyn Bath Referring Jonne Rote: Tracie Harrier Treating Lilia Letterman/Extender: Skipper Cliche in Treatment: 19 Encounter Discharge Information Items Discharge Condition: Stable Ambulatory Status: [place] Discharge Destination: Home Transportation: Private Auto Accompanied By: self Schedule Follow-up Appointment: Yes Clinical Summary of Care: Notes Patient has an HBO treatment scheduled on 05/23/22 at 08:00 am. Electronic Signature(s) Signed: 05/20/2022 11:59:41 AM By: Enedina Finner RCP, RRT, CHT Entered By: Enedina Finner on 05/20/2022 11:59:22 Gene Brown ([id number]) -------------------------------------------------------------------------------- Vitals Details Patient Name: Gene Brown Date of Service: 05/20/2022 8:00 AM Medical Record Number: [id number] Patient Account Number: [id number] Date of Birth/Sex: 1951-03-05 (71 y.o. M) Treating RN: Carlene Coria Primary Care Ilianna Bown: Tracie Harrier Other Clinician: Jacqulyn Bath Referring Renny Gunnarson: Tracie Harrier Treating Raylynn Hersh/Extender: Skipper Cliche in Treatment: 19 Vital Signs Time Taken: 07:47 Temperature (F): 97.9 Height (in): 68 Pulse (bpm): 66 Weight (lbs): 217 Respiratory Rate (breaths/min): 18 Body Mass Index (BMI): 33 Blood Pressure (mmHg): 122/64 Capillary Blood Glucose (mg/dl): 173 Reference Range: 80 - 120 mg / dl Electronic Signature(s) Signed: 05/20/2022 11:59:41 AM By: Enedina Finner RCP, RRT, CHT Entered By: Stark Jock, Amado Nash on 05/20/2022 08:31:29

## 2022-05-20 NOTE — Progress Notes (Signed)
KYON, BENTLER ([id number]) Visit Report for 05/20/2022 HBO Details Patient Name: Gene Brown, Gene Brown Date of Service: 05/20/2022 8:00 AM Medical Record Number: [id number] Patient Account Number: [id number] Date of Birth/Sex: 1950/11/25 (71 y.o. 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 HBO Treatment Details Treatment Number: 15 Patient Type: Outpatient Chamber Type: Monoplace Chamber Serial #: [id number] Treatment Protocol: 2.0 ATA with 90 minutes oxygen, and no air breaks Treatment Details Compression Rate Down: 1.5 psi / minute De-Compression Rate Up: 1.5 psi / minute Compress Tx Pressure Air breaks and breathing periods Decompress Decompress Begins Reached (leave unused spaces blank) Begins Ends Chamber Pressure (ATA) 1 2 - - - - - - 2 1 Clock Time (24 hr) 08:00 08:12 - - - - - - 09:42 09:53 Treatment Length: 113 (minutes) Treatment Segments: 4 Vital Signs Capillary Blood Glucose Reference Range: 80 - 120 mg / dl HBO Diabetic Blood Glucose Intervention Range: <131 mg/dl or >249 mg/dl Time Vitals Blood Respiratory Capillary Blood Glucose Pulse Action Type: Pulse: Temperature: Taken: Pressure: Rate: Glucose (mg/dl): Meter #: Oximetry (%) Taken: Pre 07:47 122/64 66 18 97.9 173 1 none per protocol Post 10:10 120/78 60 18 98.7 103 1 none per protocol Treatment Response Treatment Toleration: Well Treatment Completion Treatment Completed without Adverse Event Status: Electronic Signature(s) Signed: 05/20/2022 11:59:41 AM By: Enedina Finner RCP, RRT, CHT Signed: 05/20/2022 12:12:06 PM By:  Worthy Keeler PA-C Entered By: Enedina Finner on 05/20/2022 11:58:23 Gene Brown ([id number]) -------------------------------------------------------------------------------- HBO Safety Checklist Details Patient Name: Gene Brown Date of Service: 05/20/2022 8:00 AM Medical Record Number: [id number] Patient Account Number: [id number] Date of Birth/Sex: 11/18/1950 (71 y.o. M) Treating RN: Carlene Coria Primary Care Kebra Lowrimore: Tracie Harrier Other Clinician: Jacqulyn Bath Referring Breyton Vanscyoc: Tracie Harrier Treating Hendy Brindle/Extender: Skipper Cliche in Treatment: 19 HBO Safety Checklist Items Safety Checklist Consent Form Signed Patient voided / foley secured and emptied When did you last eato 07:00 am Last dose of injectable or oral agent 05/19/22 pm Ostomy pouch emptied and vented if applicable NA All implantable devices assessed, documented and approved NA Intravenous access site secured and place NA Valuables secured Linens and cotton and cotton/polyester blend (less than 51% polyester) Personal oil-based products / skin lotions / body lotions removed Wigs or hairpieces removed NA Smoking or tobacco materials removed NA Books / newspapers / magazines / loose paper removed NA Cologne, aftershave, perfume and deodorant removed Jewelry removed (may wrap wedding band) Make-up removed NA Hair care products removed Battery operated devices (external) removed NA Heating patches and chemical warmers removed NA Titanium eyewear removed NA Nail polish cured greater than 10 hours NA Casting material cured greater than 10 hours NA Hearing aids removed NA Loose dentures or partials removed NA Prosthetics have been removed NA Patient demonstrates correct use of air break device (if applicable) Patient concerns have been addressed Patient grounding bracelet on and cord attached to chamber Specifics for Inpatients (complete in addition  to above) Medication sheet sent with patient Intravenous medications needed or due during therapy sent with patient Drainage tubes (e.g. nasogastric tube or chest tube secured and vented) Endotracheal or Tracheotomy tube secured Cuff deflated of air and inflated with saline Airway suctioned Electronic Signature(s)  Signed: 05/20/2022 11:59:41 AM By: Enedina Finner RCP, RRT, CHT Entered By: Enedina Finner on 05/20/2022 08:33:04

## 2022-05-23 ENCOUNTER — Encounter: Payer: Medicare Other | Admitting: Physician Assistant

## 2022-05-23 DIAGNOSIS — E1169 Type 2 diabetes mellitus with other specified complication: Secondary | ICD-10-CM | POA: Diagnosis not present

## 2022-05-23 LAB — GLUCOSE, CAPILLARY
Glucose-Capillary: 160 mg/dL — ABNORMAL HIGH (ref 70–99)
Glucose-Capillary: 106 mg/dL — ABNORMAL HIGH (ref 70–99)

## 2022-05-23 NOTE — Progress Notes (Signed)
SHANDELL, JALLOW ([id number]) [id number] Page 1 of 2 Visit Report for 05/23/2022 Arrival Information Details Patient Name: Date of Service: [name], [place] UL V. 05/23/2022 8:00 A M Medical Record Number: [id number] Patient Account Number: [id number] Date of Birth/Sex: Treating RN: 1951-03-17 (71 y.o. [name], [name] Primary Care Jaima Janney: Tracie Harrier Other Clinician: Jacqulyn Bath Referring Rasheen Bells: Treating Trysten Bernard/Extender: Solon Palm Weeks in Treatment: 34 Visit Information History Since Last Visit Added or deleted any medications: No Patient Arrived: Kasandra Knudsen Any new allergies or adverse reactions: No Arrival Time: 07:50 Had a fall or experienced change in No Accompanied By: self activities of daily living that may affect Transfer Assistance: None risk of falls: Patient Identification Verified: Yes Signs or symptoms of abuse/neglect since No Secondary Verification Process Completed: Yes last visito Patient Requires Transmission-Based No Hospitalized since last visit: No Precautions: Implantable device outside of the clinic No Patient Has Alerts: Yes excluding Patient Alerts: Patient on Blood Thinner cellular tissue based products placed in the 01/07/2022 [place]>220 on left center since last visit: Has Dressing in [place] as Prescribed: Yes Has Footwear/Offloading in Place as Yes Prescribed: Left: Surgical Shoe with Pressure Relief Insole Pain Present Now: No Electronic Signature(s) Signed: 05/23/2022 9:42:38 AM By: Enedina Finner RCP, RRT CHT , , Entered By: Enedina Finner on 05/23/2022 08:52:28 , -------------------------------------------------------------------------------- Encounter Discharge Information Details Patient Name: Date of Service: [name], [name] 05/23/2022 8:00 A M Medical Record Number: [id number] Patient Account Number: [id number] Date of Birth/Sex: Treating RN: [birth year] (71 y.o. [name] Primary Care Smitty Ackerley: Tracie Harrier Other Clinician: Jacqulyn Bath Referring Daivd Fredericksen: Treating Devanee Pomplun/Extender: Solon Palm Weeks in Treatment: 16 Encounter Discharge Information Items Discharge Condition: Stable Ambulatory Status: [facility] Discharge Destination: Home JAHLIL, ZILLER ([id number]) [id number] Page 2 of 2 Transportation: Private Auto Accompanied By: self Schedule Follow-up Appointment: Yes Clinical Summary of Care: Notes Patient has an HBO treatment scheduled on 05/24/22 at 08:00 am. Electronic Signature(s) Signed: 05/23/2022 9:42:38 AM By: Enedina Finner RCP, RRT CHT , , Entered By: Enedina Finner on 05/23/2022 12:41:49 , -------------------------------------------------------------------------------- Vitals Details Patient Name: Date of Service: [name], [name] 05/23/2022 8:00 A M Medical Record Number: [id number] Patient Account Number: [id number] Date of Birth/Sex: Treating RN: [birth year] (71 y.o. [name] Primary Care Sloane Palmer: Tracie Harrier Other Clinician: Jacqulyn Bath Referring Tenoch Mcclure: Treating Kilan Banfill/Extender: [name] Vital Signs Time Taken: 08:03 Temperature (F): 97.8 Height (in): 68 Pulse (bpm): 72 Weight (lbs): 217 Respiratory Rate (breaths/min): 18 Body Mass Index (BMI): 33 Blood Pressure (mmHg): 138/72 Capillary Blood Glucose (mg/dl): 160 Reference Range: 80 - 120 mg / dl Electronic Signature(s) Signed: 05/23/2022 9:42:38 AM By: Enedina Finner RCP, RRT CHT , , Entered By: Enedina Finner on 05/23/2022 08:58:37 ,

## 2022-05-23 NOTE — Progress Notes (Signed)
RAHMAN, FERRALL ([id number]) [id number] Page 1 of 2 Visit Report for 05/23/2022 HBO Details Patient Name: Date of Service: Gene Brown, Gene Brown UL V. 05/23/2022 8:00 A M Medical Record Number: [id number] Patient Account Number: [id number] Date of Birth/Sex: Treating RN: 30-Jul-1951 (71 y.o. 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 HBO Treatment Details Treatment Number: 16 Patient Type: Outpatient Chamber Type: Monoplace Chamber Serial #: [id number] Treatment Protocol: 2.0 ATA with 90 minutes oxygen, and no air breaks Treatment Details Compression Rate Down: 1.5 psi / minute De-Compression Rate Up: 1.5 psi / minute Air breaks and breathing Decompress Decompress Compress Tx Pressure Begins Reached periods Begins Ends (leave unused spaces blank) Chamber Pressure (ATA 1 2 ------2 1 ) Clock Time (24 hr) 08:30 08:42 - - - - - - 10:12 10:23 Treatment Length: 113 (minutes) Treatment Segments: 4 Vital Signs Capillary Blood Glucose Reference Range: 80 - 120 mg / dl HBO Diabetic Blood Glucose Intervention Range: <131 mg/dl or >249 mg/dl Type: Time Vitals Blood Respiratory Capillary Blood Glucose Pulse Action Pulse: Temperature: Taken: Pressure: Rate: Glucose (mg/dl): Meter #: Oximetry (%) Taken: Pre 08:03 138/72 72 18 97.8 160 1 none per protocol Post 10:46 130/82 72 18 97.6 106 1 none per protocol Treatment Response Treatment Toleration: Well Treatment Completion Status: Treatment Completed without Adverse Event HBO Attestation I certify that I supervised this HBO treatment in accordance with  Medicare guidelines. A trained emergency response team is readily available per Yes hospital policies and procedures. Continue HBOT as ordered. Yes Electronic Signature(s) Signed: 05/23/2022 2:01:26 PM By: Worthy Keeler PA-C Previous Signature: 05/23/2022 9:42:38 AM Version By: Enedina Finner RCP, RRT CHT , , Entered By: Worthy Keeler on 05/23/2022 17:01:25 [name] ([id number]) [id number] Page 2 of 2 -------------------------------------------------------------------------------- HBO Safety Checklist Details Patient Name: Date of Service: Gene Brown, Gene Brown UL V. 05/23/2022 8:00 A M Medical Record Number: [id number] Patient Account Number: [id number] Date of Birth/Sex: Treating RN: Jan 04, 1951 (71 y.o. [name], [name] Primary Care Ryle Buscemi: Tracie Harrier Other Clinician: Jacqulyn Bath Referring Whitfield Dulay: Treating Mariam Helbert/Extender: [name] HBO Safety Checklist Items Safety Checklist Consent Form Signed Patient voided / foley secured and emptied When did you last eato 07:00 am Last dose of injectable or oral agent 07:00 am Ostomy pouch emptied and vented if applicable NA All implantable devices assessed, documented and approved NA Intravenous access site secured and place NA Valuables secured Linens and cotton and cotton/polyester blend (less than 51% polyester) Personal oil-based products / skin lotions / body lotions removed Wigs or hairpieces removed NA Smoking or tobacco materials removed NA Books / newspapers / magazines / loose paper removed NA Cologne, aftershave, perfume and deodorant removed Jewelry removed (may wrap wedding band) Make-up removed NA Hair care products removed Battery operated devices (external) removed NA Heating patches and chemical warmers removed NA Titanium eyewear removed NA Nail polish cured greater than 10 hours NA Casting material cured greater  than 10 hours NA Hearing aids removed NA Loose dentures or partials removed NA Prosthetics have been removed NA Patient demonstrates correct use of air break device (if applicable) Patient concerns have been addressed Patient grounding bracelet on and cord attached to chamber Specifics  for Inpatients (complete in addition to above) Medication sheet sent with patient Intravenous medications needed or due during therapy sent with patient Drainage tubes (e.g. nasogastric tube or chest tube secured and vented) Endotracheal or Tracheotomy tube secured Cuff deflated of air and inflated with saline Airway suctioned Electronic Signature(s) Signed: 05/23/2022 9:42:38 AM By: Aleda Grana RCP, RRT CHT , , Entered By: Aleda Grana on 05/23/2022 09:05:17 ,

## 2022-05-23 NOTE — Progress Notes (Signed)
CHASYN, CINQUE ([id number]) [id number] Page 1 of 2 Visit Report for 05/23/2022 Problem List Details Patient Name: Date of Service: Gene Brown, [place] UL V. 05/23/2022 8:00 A M Medical Record Number: [id number] Patient Account Number: [id number] Date of Birth/Sex: Treating RN: 04-03-1951 (71 y.o. 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 05/23/2022 Medical Record Number: [id number] Patient Account Number: [id number] Date of Birth/Sex: Treating RN: Nov 26, 1950 (71 y.o. 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.42 Type 2 diabetes mellitus with diabetic polyneuropathy I10 Essential (primary) hypertension Facility Procedures : CPT4 Code: 53299242 Description: (Facility Use Only) HBOT full body chamber, 17min , Modifier: Quantity: 4 Physician Procedures : CPT4 Code Description Modifier [id number] - WC PHYS HYPERBARIC OXYGEN THERAPY ICD-10 Diagnosis Description M86.672 Other chronic osteomyelitis, left ankle and foot L97.522 Non-pressure chronic ulcer of other part of left foot with fat layer e  L89.211 Type 2 diabetes mellitus with foot ulcer Quantity: 1 xposed Electronic Signature(s) Signed: 05/23/2022 2:01:33 PM By: Worthy Keeler PA-C Previous Signature: 05/23/2022 9:42:38 AM Version By: Enedina Finner RCP, RRT CHT , , Entered By: Worthy Keeler on 05/23/2022 17:01:32

## 2022-05-24 ENCOUNTER — Encounter: Payer: Medicare Other | Admitting: Physician Assistant

## 2022-05-24 DIAGNOSIS — E1169 Type 2 diabetes mellitus with other specified complication: Secondary | ICD-10-CM | POA: Diagnosis not present

## 2022-05-24 LAB — GLUCOSE, CAPILLARY
Glucose-Capillary: 189 mg/dL — ABNORMAL HIGH (ref 70–99)
Glucose-Capillary: 111 mg/dL — ABNORMAL HIGH (ref 70–99)

## 2022-05-24 NOTE — Progress Notes (Signed)
ROSEVELT, LUU ([id number]) [id number] Page 1 of 2 Visit Report for 05/24/2022 Arrival Information Details Patient Name: Date of Service: [name], [place] UL V. 05/24/2022 8:00 A M Medical Record Number: [id number] Patient Account Number: [id number] Date of Birth/Sex: Treating RN: [birth year] (71 y.o. [name], [name] Primary Care Sosaia Pittinger: Tracie Harrier Other Clinician: Jacqulyn Bath Referring Braylinn Gulden: Treating Shomari Scicchitano/Extender: Solon Palm Weeks in Treatment: 78 Visit Information History Since Last Visit Added or deleted any medications: No Patient Arrived: Kasandra Knudsen Any new allergies or adverse reactions: No Arrival Time: 07:50 Had a fall or experienced change in No Accompanied By: selfl activities of daily living that may affect Transfer Assistance: None risk of falls: Patient Identification Verified: Yes Signs or symptoms of abuse/neglect since No Secondary Verification Process Completed: Yes last visito Patient Requires Transmission-Based No Hospitalized since last visit: No Precautions: Implantable device outside of the clinic No Patient Has Alerts: Yes excluding Patient Alerts: Patient on Blood Thinner cellular tissue based products placed in the 01/07/2022 [place]>220 on left center since last visit: Has Dressing in [place] as Prescribed: Yes Has Footwear/Offloading in Place as Yes Prescribed: Left: Surgical Shoe with Pressure Relief Insole Pain Present Now: No Electronic Signature(s) Signed: 05/24/2022 11:11:24 AM By: Enedina Finner RCP, RRT CHT , , Entered By: Enedina Finner on 05/24/2022 09:07:05 , -------------------------------------------------------------------------------- Encounter Discharge Information Details Patient Name: Date of Service: [name], [name] 05/24/2022 8:00 A M Medical Record Number: [id number] Patient Account Number: [id number] Date of Birth/Sex: Treating RN: [birth year] (71 y.o. [name] Primary Care Johnson Arizola: Tracie Harrier Other Clinician: Jacqulyn Bath Referring Emiliana Blaize: Treating Vashon Riordan/Extender: Solon Palm Weeks in Treatment: 27 Encounter Discharge Information Items Discharge Condition: Stable Ambulatory Status: [facility] Discharge Destination: Home COLLEN, VINCENT ([id number]) [id number] Page 2 of 2 Transportation: Private Auto Accompanied By: self Schedule Follow-up Appointment: Yes Clinical Summary of Care: Notes Patient has an HBO treatment scheduled on 05/25/22 at 08:00 am. Electronic Signature(s) Signed: 05/24/2022 11:11:24 AM By: Enedina Finner RCP, RRT CHT , , Entered By: Enedina Finner on 05/24/2022 11:11:06 , -------------------------------------------------------------------------------- Vitals Details Patient Name: Date of Service: [name], [name] 05/24/2022 8:00 A M Medical Record Number: [id number] Patient Account Number: [id number] Date of Birth/Sex: Treating RN: Jun 02, 1951 (71 y.o. [name] Primary Care Rafferty Postlewait: Tracie Harrier Other Clinician: Jacqulyn Bath Referring Nahmir Zeidman: Treating Abdoul Encinas/Extender: [name] Vital Signs Time Taken: 08:08 Temperature (F): 97.7 Height (in): 68 Pulse (bpm): 72 Weight (lbs): 217 Respiratory Rate (breaths/min): 18 Body Mass Index (BMI): 33 Blood Pressure (mmHg): 138/68 Capillary Blood Glucose (mg/dl): 189 Reference Range: 80 - 120 mg / dl Electronic Signature(s) Signed: 05/24/2022 11:11:24 AM By: Enedina Finner RCP, RRT CHT , , Entered By: Enedina Finner on 05/24/2022 09:08:04 ,

## 2022-05-24 NOTE — Progress Notes (Signed)
DEXTON, ZWILLING ([id number]) [id number] Page 1 of 2 Visit Report for 05/24/2022 Problem List Details Patient Name: Date of Service: Gene Brown, [place] UL V. 05/24/2022 8:00 A M Medical Record Number: [id number] Patient Account Number: [id number] Date of Birth/Sex: Treating RN: 1951-02-02 (71 y.o. 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 05/24/2022 Medical Record Number: [id number] Patient Account Number: [id number] Date of Birth/Sex: Treating RN: 03/02/1951 (71 y.o. [name], [name] Primary Care Provider: Tracie Harrier Other Clinician: Jacqulyn Bath Referring Provider: Treating Provider/Extender: [name] Diagnosis Coding ICD-10 Codes Code Description [phone] Other chronic osteomyelitis, left ankle and foot E11.621 Type 2 diabetes mellitus with foot ulcer L97.522 Non-pressure chronic ulcer of other part of left foot with fat layer  exposed E11.42 Type 2 diabetes mellitus with diabetic polyneuropathy I10 Essential (primary) hypertension Facility Procedures : CPT4 Code: [id number] Description: (Facility Use Only) HBOT full body chamber, 54min , Modifier: Quantity: 4 Physician Procedures : CPT4 Code Description Modifier [id number] - WC PHYS HYPERBARIC OXYGEN THERAPY ICD-10 Diagnosis Description M86.672 Other chronic osteomyelitis, 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

## 2022-05-24 NOTE — Progress Notes (Addendum)
Gene Brown (Gene Brown for 05/24/2022 Arrival Information Details Patient Name: Date of Service: Gene Brown, [place] UL V. 05/24/2022 10:45 A M Medical Record Number: Gene Brown Patient Account Number: [id number] Date of Birth/Sex: Treating RN: 07/19/1951 (71 y.o. Gene Brown Primary Care Gene Brown: Gene Brown Other Clinician: Referring Gene Brown: Treating Gene Brown/Extender: Gene Brown: 19 Visit Information History Since Last Visit Added or deleted any medications: No Patient Arrived: [name] Arrival Time: 10:43 Pain Present Now: No Accompanied By: self Transfer Assistance: None Patient Identification Verified: Yes Secondary Verification Process Completed: Yes Patient Requires Transmission-Based Precautions: No Patient Has Alerts: Yes Patient Alerts: Patient on Blood Thinner 01/07/2022 Gene Brown>220 on left Electronic Signature(s) Signed: 05/24/2022 2:48:39 PM By: Gene Brown, BSN, RN, CWS, Kim RN, BSN Entered By: Gene Brown, BSN, RN, CWS, Gene Brown on 05/24/2022 10:44:40 -------------------------------------------------------------------------------- Clinic Level of Care Assessment Details Patient Name: Date of Service: Gene NS, [name] 05/24/2022 10:45 A M Medical Record Number: Gene Brown Patient Account Number: [id number] Date of Birth/Sex: Treating RN: 24-Aug-1950 (71 y.o. Gene Brown Primary Care Mirayah Wren: Gene Brown Other Clinician: Referring Gene Brown: Treating Gene Brown: Gene Brown: 19 Clinic Level of Care Assessment Items TOOL 4 Quantity Score []  - 0 Use when only an EandM is performed on FOLLOW-UP visit ASSESSMENTS - Nursing Assessment / Reassessment X- 1 10 Reassessment of Co-morbidities (includes updates in patient status) X- 1 5 Reassessment of Adherence to Brown Plan ASSESSMENTS  - Wound and Skin A ssessment / Reassessment []  - 0 Simple Wound Assessment / Reassessment - one wound Gene Brown (Gene Brown) [id number] Page 2 of 10 X- 2 5 Complex Wound Assessment / Reassessment - multiple wounds []  - 0 Dermatologic / Skin Assessment (not related to wound area) ASSESSMENTS - Focused Assessment []  - 0 Circumferential Edema Measurements - multi extremities []  - 0 Nutritional Assessment / Counseling / Intervention []  - 0 Lower Extremity Assessment (monofilament, tuning fork, pulses) []  - 0 Peripheral Arterial Disease Assessment (using hand held doppler) ASSESSMENTS - Ostomy and/or Continence Assessment and Care []  - 0 Incontinence Assessment and Management []  - 0 Ostomy Care Assessment and Management (repouching, etc.) PROCESS - Coordination of Care X - Simple Patient / Family Education for ongoing care 1 15 []  - 0 Complex (extensive) Patient / Family Education for ongoing care X- 1 10 Staff obtains Programmer, systems, Records, T Results / Process Orders est []  - 0 Staff telephones HHA, Nursing Homes / Clarify orders / etc []  - 0 Routine Transfer to another Facility (non-emergent condition) []  - 0 Routine Hospital Admission (non-emergent condition) []  - 0 New Admissions / Biomedical engineer / Ordering NPWT Apligraf, etc. , []  - 0 Emergency Hospital Admission (emergent condition) X- 1 10 Simple Discharge Coordination []  - 0 Complex (extensive) Discharge Coordination PROCESS - Special Needs []  - 0 Pediatric / Minor Patient Management []  - 0 Isolation Patient Management []  - 0 Hearing / Language / Visual special needs []  - 0 Assessment of Community assistance (transportation, D/C planning, etc.) []  - 0 Additional assistance / Altered mentation []  - 0 Support Surface(s) Assessment (bed, cushion, seat, etc.) INTERVENTIONS - Wound Cleansing / Measurement X - Simple Wound Cleansing - one wound 1 5 []  - 0 Complex Wound  Cleansing - multiple wounds X- 1 5 Wound Imaging (photographs - any number of wounds) []  - 0 Wound Tracing (instead of photographs) X- 1 5 Simple Wound Measurement -  one wound []  - 0 Complex Wound Measurement - multiple wounds INTERVENTIONS - Wound Dressings []  - 0 Small Wound Dressing one or multiple wounds X- 1 15 Medium Wound Dressing one or multiple wounds []  - 0 Large Wound Dressing one or multiple wounds []  - 0 Application of Medications - topical []  - 0 Application of Medications - injection INTERVENTIONS - Miscellaneous []  - 0 External ear exam []  - 0 Specimen Collection (cultures, biopsies, blood, body fluids, etc.) []  - 0 Specimen(s) / Culture(s) sent or taken to Lab for analysis [name], Gene (Gene Brown) [id number] Page 3 of 10 []  - 0 Patient Transfer (multiple staff / Civil Service fast streamer / Similar devices) []  - 0 Simple Staple / Suture removal (25 or less) []  - 0 Complex Staple / Suture removal (26 or more) []  - 0 Hypo / Hyperglycemic Management (close monitor of Blood Glucose) []  - 0 Ankle / Brachial Index (ABI) - do not check if billed separately X- 1 5 Vital Signs Has the patient been seen at the hospital within the last three years: Yes Total Score: 95 Level Of Care: New/Established - Level 3 Electronic Signature(s) Signed: 05/24/2022 2:48:39 PM By: Gene Brown, BSN, RN, CWS, Kim RN, BSN Entered By: Gene Brown, BSN, RN, CWS, Gene Brown on 05/24/2022 11:07:07 -------------------------------------------------------------------------------- Encounter Discharge Information Details Patient Name: Date of Service: Gene Bake, [name] 05/24/2022 10:45 A M Medical Record Number: Gene Brown Patient Account Number: [id number] Date of Birth/Sex: Treating RN: 1951/05/30 (71 y.o. Gene Brown Primary Care Gene Brown: Gene Brown Other Clinician: Referring Gene Brown: Treating Gene Brown: Gene Brown:  19 Encounter Discharge Information Items Discharge Condition: Stable Ambulatory Status: Cane Discharge Destination: Home Transportation: Private Auto Schedule Follow-up Appointment: Yes Clinical Summary of Care: Electronic Signature(s) Signed: 05/24/2022 2:48:39 PM By: Gene Brown, BSN, RN, CWS, Kim RN, BSN Entered By: Gene Brown, BSN, RN, CWS, Gene Brown on 05/24/2022 11:08:10 -------------------------------------------------------------------------------- Lower Extremity Assessment Details Patient Name: Date of Service: Gene Bake, [name] 05/24/2022 10:45 A M Medical Record Number: Gene Brown Patient Account Number: [id number] Date of Birth/Sex: Treating RN: February 27, 1951 (71 y.o. Gene Brown Primary Care Tarence Searcy: Gene Brown Other Clinician: Referring Mar Zettler: Treating Pernell Dikes/Extender: Juellz, Picariello (Gene Brown) [id number] Page 4 of 10 Weeks in Brown: 19 Edema Assessment Assessed: [Left: No] [Right: No] [Left: Edema] [Right: :] Calf Left: Right: Point of Measurement: 28 cm From Medial Instep 42.5 cm Ankle Left: Right: Point of Measurement: 11 cm From Medial Instep 28 cm Vascular Assessment Pulses: Dorsalis Pedis Palpable: [Left:Yes] Electronic Signature(s) Signed: 05/24/2022 2:48:39 PM By: Gene Brown, BSN, RN, CWS, Kim RN, BSN Entered By: Gene Brown, BSN, RN, CWS, Gene Brown on 05/24/2022 10:51:40 -------------------------------------------------------------------------------- Multi Wound Chart Details Patient Name: Date of Service: Gene Bake, [name] 05/24/2022 10:45 A M Medical Record Number: Gene Brown Patient Account Number: [id number] Date of Birth/Sex: Treating RN: 03-08-1951 (71 y.o. Gene Brown Primary Care Shaquinta Peruski: Gene Brown Other Clinician: Referring Mylisa Brunson: Treating Shacola Schussler/Extender: Gene Brown: 19 Vital Signs Height(in): 68 Pulse(bpm): 72 Weight(lbs): 217 Blood  Pressure(mmHg): 122/80 Body Mass Index(BMI): 33 Temperature(F): 98.7 Respiratory Rate(breaths/min): 18 [2:Photos: No Photos Left, Plantar T Great oe Wound Location: Gradually Appeared Wounding Event: Diabetic Wound/Ulcer of the Lower Primary Etiology: Extremity 12/09/2021 Date Acquired: 19 Weeks of Brown: Healed - Epithelialized Wound Status: No Wound Recurrence:  0x0x0 Measurements L x W x D (cm) 0 A (cm) : rea 0 Volume (cm) : 100.00% % Reduction in A rea: 100.00% % Reduction  in Volume: Grade 3 Classification: Medium Exudate A mount: Serosanguineous Exudate Type:] [3:No Photos Left, Dorsal T Great oe Pressure  Injury Diabetic Wound/Ulcer of the Lower Extremity 03/25/2022 4 Open No 0.3x0.2x0.3 0.047 0.014 50.00% 81.30% Grade 3 None Present N/A] [N/A:N/A N/A N/A N/A N/A N/A N/A N/A N/A N/A N/A N/A N/A N/A N/A N/A] ALEKSA, CREASEY (Gene Brown) [2:red, brown Exudate Color:] [3:N/A] [N/A:[id number] Page 5 of 10 N/A] Brown Notes Electronic Signature(s) Signed: 05/24/2022 2:48:39 PM By: Gene Brown, BSN, RN, CWS, Kim RN, BSN Entered By: Gene Brown, BSN, RN, CWS, Gene Brown on 05/24/2022 10:56:56 -------------------------------------------------------------------------------- Multi-Disciplinary Care Plan Details Patient Name: Date of Service: Gene NS, [name] 05/24/2022 10:45 A M Medical Record Number: Gene Brown Patient Account Number: [id number] Date of Birth/Sex: Treating RN: Jun 28, 1951 (71 y.o. Gene Brown Primary Care Nasri Boakye: Gene Brown Other Clinician: Referring Rody Keadle: Treating Airiana Elman/Extender: Gene Brown: 69 Active Inactive HBO Nursing Diagnoses: Anxiety related to feelings of confinement associated with the hyperbaric oxygen chamber Anxiety related to knowledge deficit of hyperbaric oxygen therapy and Brown procedures Discomfort related to temperature and humidity changes inside hyperbaric chamber Potential  for barotraumas to ears, sinuses, teeth, and lungs or cerebral gas embolism related to changes in atmospheric pressure inside hyperbaric oxygen chamber Potential for oxygen toxicity seizures related to delivery of 100% oxygen at an increased atmospheric pressure Potential for pulmonary oxygen toxicity related to delivery of 100% oxygen at an increased atmospheric pressure Goals: Barotrauma will be prevented during HBO2 Date Initiated: 05/10/2022 T arget Resolution Date: 05/02/2022 Goal Status: Active Patient and/or family will be able to state/discuss factors appropriate to the management of their disease process during Brown Date Initiated: 05/10/2022 T arget Resolution Date: 05/02/2022 Goal Status: Active Patient will tolerate the hyperbaric oxygen therapy Brown Date Initiated: 05/10/2022 T arget Resolution Date: 05/02/2022 Goal Status: Active Patient will tolerate the internal climate of the chamber Date Initiated: 05/10/2022 T arget Resolution Date: 05/02/2022 Goal Status: Active Patient/caregiver will verbalize understanding of HBO goals, rationale, procedures and potential hazards Date Initiated: 05/10/2022 T arget Resolution Date: 05/02/2022 Goal Status: Active Signs and symptoms of pulmonary oxygen toxicity will be recognized and promptly addressed Date Initiated: 05/10/2022 T arget Resolution Date: 05/02/2022 Goal Status: Active Signs and symptoms of seizure will be recognized and promptly addressed ; seizing patients will suffer no harm Date Initiated: 05/10/2022 T arget Resolution Date: 05/02/2022 Goal Status: Active Interventions: Administer a five (5) minute air break for patient if signs and symptoms of seizure appear and notify the hyperbaric physician Administer decongestants, per physician orders, prior to HBO2 Administer the correct therapeutic gas delivery based on the patients needs and limitations, per physician order Assess and provide for patients comfort related  to the hyperbaric environment and equalization of middle ear KEMAURION, HORWITZ (Gene Brown) [id number] Page 6 of 10 Assess for signs and symptoms related to adverse events, including but not limited to confinement anxiety, pneumothorax, oxygen toxicity and baurotrauma Assess patient for any history of confinement anxiety Assess patient's knowledge and expectations regarding hyperbaric medicine and provide education related to the hyperbaric environment, goals of Brown and prevention of adverse events Implement protocols to decrease risk of pneumothorax in high risk patients Notes: Peripheral Neuropathy Nursing Diagnoses: Knowledge deficit related to disease process and management of peripheral neurovascular dysfunction Potential alteration in peripheral tissue perfusion (select prior to confirmation of diagnosis) Goals: Patient/caregiver will verbalize understanding of disease process and disease management Date Initiated: 01/07/2022 Target Resolution Date: 01/07/2022 Goal Status: Active  Interventions: Assess signs and symptoms of neuropathy upon admission and as needed Provide education on Management of Neuropathy and Related Ulcers Provide education on Management of Neuropathy upon discharge from the [facility] Notes: Electronic Signature(s) Signed: 05/24/2022 2:48:39 PM By: Gene Brown, BSN, RN, CWS, Kim RN, BSN Entered By: Gene Brown, BSN, RN, CWS, Gene Brown on 05/24/2022 10:54:37 -------------------------------------------------------------------------------- Pain Assessment Details Patient Name: Date of Service: Gene Bake, [name] 05/24/2022 10:45 A M Medical Record Number: Gene Brown Patient Account Number: [id number] Date of Birth/Sex: Treating RN: [birth year] (71 y.o. Gene Brown Primary Care Mylin Gignac: Gene Brown Other Clinician: Referring Shelia Kingsberry: Treating Hulbert Branscome/Extender: Gene Brown: 19 Active Problems Location  of Pain Severity and Description of Pain Patient Has Paino No Site Locations DAMAIN, VASA (Gene Brown) [id number] Page 7 of 10 Pain Management and Medication Current Pain Management: Notes Patient denies pain at this time. Electronic Signature(s) Signed: 05/24/2022 2:48:39 PM By: Gene Brown, BSN, RN, CWS, Kim RN, BSN Entered By: Gene Brown, BSN, RN, CWS, Gene Brown on 05/24/2022 10:45:25 -------------------------------------------------------------------------------- Patient/Caregiver Education Details Patient Name: Date of Service: Gene Bake, [name] 10/17/2023andnbsp10:45 A M Medical Record Number: Gene Brown Patient Account Number: [id number] Date of Birth/Gender: Treating RN: 1950-11-13 (71 y.o. Gene Brown Primary Care Physician: Gene Brown Other Clinician: Referring Physician: Treating Physician/Extender: Gene Brown: 10 Education Assessment Education Provided To: Patient Education Topics Provided Offloading: Handouts: Other: offloading shoe Methods: Explain/Verbal Responses: State content correctly Electronic Signature(s) Signed: 05/24/2022 2:48:39 PM By: Gene Brown, BSN, RN, CWS, Kim RN, BSN Entered By: Gene Brown, BSN, RN, CWS, Gene Brown on 05/24/2022 11:07:37 -------------------------------------------------------------------------------- Wound Assessment Details Patient Name: Date of Service: Gene Bake, [name] 05/24/2022 10:45 A M Medical Record Number: Gene Brown Patient Account Number: [id number] Date of Birth/Sex: Treating RN: 1951/07/06 (71 y.o. Gene Brown Primary Care Jasiel Apachito: Gene Brown Other Clinician: Referring Crystol Walpole: Treating Nemiah Bubar/Extender: Gene Brown: [id number] [place] V (Gene Brown) [id number] Page 8 of 10 Wound Status Wound Number: 2 Primary Etiology: Diabetic Wound/Ulcer of the Lower Extremity Wound Location: Left, Plantar  T Great oe Wound Status: Healed - Epithelialized Wounding Event: Gradually Appeared Date Acquired: 12/09/2021 Weeks Of Brown: 19 Clustered Wound: No Photos Photo Uploaded By: Gene Brown, BSN, RN, CWS, Gene Brown on 05/24/2022 12:03:03 Wound Measurements Length: (cm) Width: (cm) Depth: (cm) Area: (cm) Volume: (cm) 0 % Reduction in Area: 100% 0 % Reduction in Volume: 100% 0 0 0 Wound Description Classification: Grade 3 Exudate Amount: Medium Exudate Type: Serosanguineous Exudate Color: red, brown Brown Notes Wound #2 (Toe Great) Wound Laterality: Plantar, Left Cleanser Peri-Wound Care Topical Primary Dressing Secondary Dressing Secured With Compression Wrap Compression Stockings Add-Ons Electronic Signature(s) Signed: 05/24/2022 2:48:39 PM By: Gene Brown, BSN, RN, CWS, Kim RN, BSN Entered By: Gene Brown, BSN, RN, CWS, Gene Brown on 05/24/2022 10:56:34 [name] (Gene Brown) [id number] Page 9 of 10 -------------------------------------------------------------------------------- Wound Assessment Details Patient Name: Date of Service: Gene Brown, [place] UL V. 05/24/2022 10:45 A M Medical Record Number: Gene Brown Patient Account Number: [id number] Date of Birth/Sex: Treating RN: 05/18/1951 (71 y.o. Gene Brown Primary Care Culley Hedeen: Gene Brown Other Clinician: Referring Mirel Hundal: Treating Wakeelah Solan/Extender: Gene Brown: 19 Wound Status Wound Number: 3 Primary Diabetic Wound/Ulcer of the Lower Extremity Etiology: Wound Location: Left, Dorsal T Great oe Wound Open Wounding Event: Pressure Injury Status: Date Acquired: 03/25/2022 Comorbid Cataracts, Sickle Cell Disease, Sleep Apnea, Hypertension, Type Weeks Of Brown: 4 History: II Diabetes, Gout, Osteoarthritis Clustered Wound:  No Photos Wound Measurements Length: (cm) 0.3 Width: (cm) 0.3 Depth: (cm) 0.4 Area: (cm) 0.071 Volume: (cm) 0.028 %  Reduction in Area: 24.5% % Reduction in Volume: 62.7% Epithelialization: Large (67-100%) Wound Description Classification: Grade 3 Wound Margin: Distinct, outline attached Exudate Amount: None Present Foul Odor After Cleansing: No Slough/Fibrino No Wound Bed Granulation Amount: None Present (0%) Exposed Structure Necrotic Amount: None Present (0%) Fat Layer (Subcutaneous Tissue) Exposed: Yes Assessment Notes Bone fragment found and removed in wound base. Brown Notes Wound #3 (Toe Great) Wound Laterality: Dorsal, Left Cleanser Peri-Wound Care Topical Primary Dressing Prisma 4.34 (in) Discharge Instruction: Moisten w/normal saline or sterile water; Cover wound as directed. Do not remove from wound bed. Secondary Dressing Gauze Discharge Instruction: As directed: dry, moistened with saline or moistened with Dakins Solution Secured With Niotaze Medipore H Soft Cloth Surgical T ape ape, 2x2 (in/yd) Compression Wrap Compression Stockings JARVIS, SAWA ([id number]) [id number] Page 10 of 10 Add-Ons Electronic Signature(s) Signed: 05/24/2022 2:48:39 PM By: Gene Brown, BSN, RN, CWS, Kim RN, BSN Entered By: Gene Brown, BSN, RN, CWS, Gene Brown on 05/24/2022 10:59:35 -------------------------------------------------------------------------------- Vitals Details Patient Name: Date of Service: Gene Bake, [name] 05/24/2022 10:45 A M Medical Record Number: [id number] Patient Account Number: [id number] Date of Birth/Sex: Treating RN: [birth year] (71 y.o. Gene Brown Primary Care Serina Nichter: Gene Brown Other Clinician: Referring Fountain Derusha: Treating Tyrianna Lightle/Extender: Gene Brown: 19 Vital Signs Time Taken: 10:39 Temperature (F): 98.7 Height (in): 68 Pulse (bpm): 72 Weight (lbs): 217 Respiratory Rate (breaths/min): 18 Body Mass Index (BMI): 33 Blood Pressure (mmHg): 122/80 Reference Range: 80 - 120 mg /  dl Electronic Signature(s) Signed: 05/24/2022 2:48:39 PM By: Gene Brown, BSN, RN, CWS, Kim RN, BSN Entered By: Gene Brown, BSN, RN, CWS, Gene Brown on 05/24/2022 10:45:09

## 2022-05-24 NOTE — Progress Notes (Addendum)
ANGELUS, HOOPES ([id number]) [id number] Page 1 of 10 Visit Report for 05/24/2022 Chief Complaint Document Details Patient Name: Date of Service: Gene Brown, [place] UL Brown. 05/24/2022 10:45 A M Medical Record Number: [id number] Patient Account Number: [id number] Date of Birth/Sex: Treating RN: [birth year] (71 y.o. Gene Brown Primary Care Provider: Tracie Harrier Other Clinician: Referring Provider: Treating Provider/Extender: [name] Information Obtained from: Patient Chief Complaint Left great toe ulcer Electronic Signature(s) Signed: 05/24/2022 10:32:28 AM By: Worthy Keeler Gene Brown-C Entered By: Worthy Keeler on 05/24/2022 10:32:28 -------------------------------------------------------------------------------- HPI Details Patient Name: Date of Service: Gene Bake, Gene Brown UL Brown. 05/24/2022 10:45 A M Medical Record Number: [id number] Patient Account Number: [id number] Date of Birth/Sex: Treating RN: November 02, 1950 (71 y.o. Gene Brown Primary Care Provider: Tracie Harrier Other Clinician: Referring Provider: Treating Provider/Extender: Solon Palm Weeks in Treatment: 51 History of Present Illness HPI Description: 05/24/16; this is a 71 year old type II diabetic who probably has some degree of polyneuropathy. He tells me that he has had an area on the plantar aspect of his left great toe that is been followed by podiatry for most the last year. He has a lot of callus, the podiatrist which shave this down put a dressing on and he would follow pad monthly or sometimes longer intervals. He has not noted any drainage or pain. He has not had a recent x-ray. No serious attempt at offloading this with modified footwear.. Last hemoglobin A1c that he knows about was over 10 a month ago he is working hard on this with his primary physician. More recently he has been soaking this with peroxide ABIs are  noncompressible and the left leg. He was told by his podiatrist he has a bone spur underneath the wound but did not wish any surgical procedures. He has a caregiver at home for a very disabled wife, the nature of her illness is not certain 71/69/67 patient comes in much the same as last week amounts of callus nonviable subcutaneous tissue which required debridement. His x-ray suggested cortical bone irregularity. He will need an MRI. He tells me he has old screws/hardware left knee however he thinks he has had a subsequent MRI after this 06/07/16 at this point in time patient has been attempting to offload his wound as much as he could on his own. He has made a conscious effort to avoid placing any pressure on the toe as he was moving around and walking and I do believe this has payed off and what I am seeing at this point in time. overall the wound appears to be somewhat improved in my opinion he is not having any significant discomfort at this point in time. In fact he tells me he has no pain at all. There is no interval sign of surrounding infection in the wound itself has actually decreased in size. Unfortunately the MRI was not ordered last week and therefore we do need to proceed with the MRI order today. again this is to evaluate the cortical bone irregularity noted on x-ray. 06/14/16 Patient has continued to use the offloading shoe at this point in time. He tells me this is somewhat difficult to get used to walking and but nonetheless he is very strict about wearing it. Again I believe this has paid off very well for him and his wound appears to be greatly improved. He does have some JAGGAR, BENKO ([id number]) [id number] Page 2 of 10 callused area surrounding the wound  proximally that has caused some fluid collection of maceration of the callused area. This likely will require debridement today. Some adherent slough noted over the wound bed. 06/21/16; patient's  wound on the plantar aspect of his left great toe is totally epithelialized. His MRI of the foot is tomorrow to follow-up on the cortical irregularity noted on plain x-ray. He does not have diabetic foot where 06-28-16-He comes in today with a continued healed left great toe. MRI performed on 06/23/2016 shows no abscess osteomyelitis or septic joint. Patient was discharged from wound care center today. Readmission: 01-07-2022 upon evaluation today patient appears to be doing somewhat poorly in regard to total ulcer on his left foot. Fortunately there does not appear to be any signs of infection which is great news. Has been seen by Dr. Luana Shu and he did request from Dr. Luana Shu to come see [place] due to the fact that in the past several years back we were able to get one of his wounds healed about 4 weeks. Again I explained that that is always variable but nonetheless I definitely think we can help. Fortunately I see no evidence of infection. He does have a foot offloading shoe which I think is good to attempt for now he was able to get this healed before with a peg assist. Patient does have a history of type 2 diabetes, diabetic polyneuropathy, and hypertension. This current wound started on 12-09-2021. 01-20-2022 upon evaluation today patient appears to be doing poorly in regard to his great toe. I am actually very concerned based on what I am seeing here at this point about how things are progressing. I do not believe there is any evidence of infection systemically that is obvious but locally I am concerned about the possibility this could even be an osteomyelitis type situation. 01-28-2022 upon evaluation today patient appears to be doing well with regard to his toe ulcer compared to what we were previous. Fortunately I do not see any signs of active infection at this time which is great news. No fevers, chills, nausea, vomiting, or diarrhea. With that being said the patient has not started his Augmentin yet  which I definitely think he needs to get on board ASAP. I do see some signs of new skin growth I think he is using the front offloading shoe which is good for the time being. 02-04-2022 upon evaluation today patient appears to be doing okay in regard to his toe ulcer this is actually measuring a little bit smaller but still has a bit of callus buildup but not as much as we have seen in the past. Fortunately I do not see any evidence of active infection locally or systemically which is great news. No fevers, chills, nausea, vomiting, or diarrhea. Marland Kitchen 02-14-2022 upon evaluation today patient's wound is actually showing signs of good improvement which is great news. Fortunately I do not see any evidence of active infection locally or systemically which is great news as well. No fevers, chills, nausea, vomiting, or diarrhea. 02-21-2022 upon evaluation today patient's toe ulcer actually is showing signs of excellent improvement. I do not see any evidence of infection locally or systemically which is great news and overall I am extremely pleased with where we stand today. I do not see any signs of active infection locally or systemically which is great news. No fevers, chills, nausea, vomiting, or diarrhea. 7/24; plantar left great toe no real improvement since last week. Using silver alginate gauze. He has a forefoot offloading boot.  He also has lymphedema and uses his compression pumps I do not think this is adding any particular difficulties. It sounds as though he is still pretty active 03-07-2022 upon evaluation today patient appears to be doing excellent in regard to the wound compared to previous pictures. He still has a quite deep wound on the toe good news is the x-ray did not show any signs of osteomyelitis which is excellent. He does need some debridement today. 03-14-2022 upon evaluation today patient appears to be doing well currently in regard to his wound. He has actually been tolerating the dressing  changes without complication. With that being said the dressing seems to either be draining a lot or getting very wet otherwise I think we need to have him change this more frequently. 03-21-2022 upon evaluation today patient appears to be doing well with regard to his toe ulcer I am actually seeing signs of improvement which is great news. This is slow but nonetheless the skin around the edges of the wound are definitely improving as we proceed here. I do not see any evidence of active infection locally or systemically which is great news. 03-29-2022 upon evaluation today patient appears to be doing fairly well except for with regard to his toe being somewhat red. I do think that we may want to place him back on the Augmentin which was previously beneficial for him. He is not in disagreement with this. Otherwise I did perform some debridement of clearway some of the necrotic debris today he seems to be tolerating this without complication. 04-04-2022 upon evaluation today patient unfortunately is continued to have significant issues here with his toe ulcer. He actually has an abscess more proximal on the dorsal aspect of the toe which is new since last week. Subsequently he is on antibiotics already I think we need to proceed with an MRI to further evaluate this. 04-12-2022 upon evaluation today patient appears to be doing better in regard to his toe ulcer. This actually is less red than what it was he is about done with the initial antibiotic he does has his MRI scheduled for Friday. The reason I am going to probably extend the Augmentin for the time being I will to make sure that we keep this under control especially if he potentially has a bone infection I do not going to be going without the antibiotic therapy. Patient is in agreement with that plan. 04-18-2022 upon evaluation today patient does have an MRI for review that was obtained on Sunday. 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 He has been on antibiotics I had him on Augmentin starting initially on January 25, 2022. Continuing to be a problem here. I do not see any signs of infection spreading up his foot but the toe itself still has purulent drainage coming from it. Coupled with this I have had him on the antibiotics, Augmentin, and he is tolerating that quite well. He is actually been on this since 01-20-2022. During that time his wound is getting somewhat better. But is still been very slow which is what prompted the MRI due to the fact that I felt like at a later point he started develop an abscess on the top of his toe that then had me more concerned. At this point I do believe that he is probably can  require hyperbaric oxygen therapy I think that this is the ideal treatment plan for limb salvage coupled with the antibiotic therapy and good wound care. 04-25-2022 upon evaluation today patient appears to be doing okay in regard to his foot ulcers. He actually has an opening on the dorsal aspect of the toe which does not directly connect to the plantar aspect. This is still open in both locations he did have some callus covering that did require sharp debridement today to clear this away. Fortunately there does not appear to be any evidence of active infection at this point. 05-02-2022 upon evaluation today patient appears to be doing well in regard to his wounds. He has been tolerating the dressing changes without complication. With that being said he does have some evidence here of continued drainage. There is evidence of infection as well although I do believe that the medicine is helping to keep this under good control. His erythema is improved although the swelling of the great toe is still present. He has started HBO as of this morning. 05-10-2022 upon evaluation patient's ulcers on his toe  actually are appearing to do much better. I do think he is benefiting from the hyperbarics as well as the antibiotics as well is good wound care. Overall I think that we have a good plan going and the patient is benefiting from this greatly which is also news. I do not see any signs of clinical active infection although obviously were still in the process of treating this osteomyelitis 05-17-2022 upon evaluation today patient's wounds are actually showing signs of good granulation and epithelization there is actually some callus covering To check to see whether this is still open or not. Nonetheless I do believe both wounds are significantly improved compared to previous and there is no erythema or redness-like his toe is doing better and I think he is doing well with the hyperbarics as well as with the oral antibiotics and wound care currently. 05-24-2022 upon evaluation today patient's toe was actually doing excellent. I am very pleased with where things stand and he is making great progress as far as I am concerned. I do not see any evidence of active infection locally or systemically which is great news. No fevers, chills, nausea, vomiting, or diarrhea. GRIFFEN, FRAYNE ([id number]) [id number] Page 3 of 10 Electronic Signature(s) Signed: 05/24/2022 2:46:59 PM By: Lenda Kelp Gene Brown-C Entered By: Lenda Kelp on 05/24/2022 14:46:58 -------------------------------------------------------------------------------- Physical Exam Details Patient Name: Date of Service: Gene Heck, Gene Brown UL Brown. 05/24/2022 10:45 A M Medical Record Number: [id number] Patient Account Number: [id number] Date of Birth/Sex: Treating RN: 04-16-1951 (71 y.o. Gene Brown Primary Care Provider: Barbette Reichmann Other Clinician: Referring Provider: Treating Provider/Extender: Jearld Shines Weeks in Treatment: 75 Constitutional Well-nourished and well-hydrated in no acute  distress. Respiratory normal breathing without difficulty. Psychiatric this patient is able to make decisions and demonstrates good insight into disease process. Alert and Oriented x 3. pleasant and cooperative. Notes Upon inspection patient's wound actually is closed on the plantar aspect of the toe which is great news on the dorsal aspect this is actually still showing signs of great improvement which I am very pleased about as well. In general I do believe that he is improving quite appropriately and I do believe the silver collagen has been doing quite well. Electronic Signature(s) Signed: 05/24/2022 2:48:01 PM By: Lenda Kelp Gene Brown-C Entered By: Lenda Kelp on 05/24/2022 14:48:01 -------------------------------------------------------------------------------- Physician Orders Details Patient Name: Date  of Service: Gene NS, Gene Brown UL Brown. 05/24/2022 10:45 A M Medical Record Number: [id number] Patient Account Number: [id number] Date of Birth/Sex: Treating RN: 02-20-1951 (71 y.o. Gene Brown Primary Care Provider: Barbette Reichmann Other Clinician: Referring Provider: Treating Provider/Extender: Jearld Shines Weeks in Treatment: 29 Verbal / Phone Orders: No Diagnosis Coding ICD-10 Coding Code Description [phone] Other chronic osteomyelitis, left ankle and foot COLE, EASTRIDGE ([id number]) [id number] Page 4 of 10 E11.621 Type 2 diabetes mellitus with foot ulcer 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osed E11.42 Type 2 diabetes mellitus with diabetic polyneuropathy I10 Essential (primary) hypertension Bathing/ Shower/ Hygiene May shower; gently cleanse wound with antibacterial soap, rinse and pat dry prior to dressing wounds No tub bath. Anesthetic (Use 'Patient Medications' Section for Anesthetic Order Entry) Lidocaine applied to wound bed Edema Control - Lymphedema / Segmental Compressive  Device / Other Patient to wear own Velcro compression garment. Remove compression stockings every night before going to bed and put on every morning when getting up. Compression Pump: Use compression pump on left lower extremity for 60 minutes, twice daily. Compression Pump: Use compression pump on right lower extremity for 60 minutes, twice daily. DO YOUR BEST to sleep in the bed at night. DO NOT sleep in your recliner. Long hours of sitting in a recliner leads to swelling of the legs and/or potential wounds on your backside. Off-Loading Other: - front off-loader left Additional Orders / Instructions Follow Nutritious Diet and Increase Protein Intake Hyperbaric Oxygen Therapy Indication and location: - CRO Left great toe 2.0 ATA for 90 Minutes without A Breaks ir One treatment per day (delivered Monday through Friday unless otherwise specified in Special Instructions below): Total # of Treatments: - 40 Antihistamine 30 minutes prior to HBO Treatment, difficulty clearing ears. Finger stick Blood Glucose Pre- and Post- HBOT Treatment. Follow Hyperbaric Oxygen Glycemia Protocol Wound Treatment Wound #3 - T Great oe Wound Laterality: Dorsal, Left Prim Dressing: Prisma 4.34 (in) 1 x Per Day/15 Days ary Discharge Instructions: Moisten w/normal saline or sterile water; Cover wound as directed. Do not remove from wound bed. Secondary Dressing: Gauze 1 x Per Day/15 Days Discharge Instructions: As directed: dry, moistened with saline or moistened with Dakins Solution Secured With: Medipore T - 30M Medipore H Soft Cloth Surgical T ape ape, 2x2 (in/yd) 1 x Per Day/15 Days GLYCEMIA INTERVENTIONS PROTOCOL PRE-HBO GLYCEMIA INTERVENTIONS ACTION INTERVENTION Obtain pre-HBO capillary blood glucose (ensure 1 physician order is in chart). A. Notify HBO physician and await physician orders. 2 If result is 70 mg/dl or below: B. If the result meets the hospital definition of a critical result, follow  hospital policy. A. Give patient an 8 ounce Glucerna Shake, an 8 ounce Ensure, or 8 ounces of a Glucerna/Ensure equivalent dietary supplement*. B. Wait 30 minutes. If result is 71 mg/dl to 948 mg/dl: C. Retest patients capillary blood glucose (CBG). D. If result greater than or equal to 110 mg/dl, proceed with HBO. If result less than 110 mg/dl, notify HBO physician and consider holding HBO. If result is 131 mg/dl to 546 mg/dl: A. Proceed with HBO. A. Notify HBO physician and await physician orders. B. It is recommended to hold HBO and do If result is 250 mg/dl or greater: blood/urine ketone testing. C. If the result meets the hospital definition of a critical result, follow hospital policy. POST-HBO GLYCEMIA INTERVENTIONS ACTION INTERVENTION Obtain post HBO capillary blood glucose (ensure 1 physician order is in chart).  A. Notify HBO physician and await physician orders. 2 If result is 70 mg/dl or below: B. If the result meets the hospital definition of a critical result, follow hospital policy. A. Give patient an [address]8 ounce Glucerna Shake, an [name], Kallin Brown ([id number]) [id number] Page 5 of 10 8 ounce Ensure, or 8 ounces of a Glucerna/Ensure equivalent dietary supplement*. B. Wait 15 minutes for symptoms of If result is 71 mg/dl to 644100 mg/dl: hypoglycemia (i.e. nervousness, anxiety, sweating, chills, clamminess, irritability, confusion, tachycardia or dizziness). C. If patient asymptomatic, discharge patient. If patient symptomatic, repeat capillary blood glucose (CBG) and notify HBO physician. If result is 101 mg/dl to 034249 mg/dl: A. Discharge patient. A. Notify HBO physician and await physician orders. B. It is recommended to do blood/urine ketone If result is 250 mg/dl or greater: testing. C. If the result meets the hospital definition of a critical result, follow hospital policy. *Juice or candies are NOT equivalent products. If  patient refuses the Glucerna or Ensure, please consult the hospital dietitian for an appropriate substitute. Electronic Signature(s) Signed: 05/24/2022 2:48:39 PM By: Elliot GurneyWoody, BSN, RN, CWS, Kim RN, BSN Signed: 05/24/2022 3:55:23 PM By: Lenda KelpStone III, Nicosha Struve Gene Brown-C Entered By: Elliot GurneyWoody, BSN, RN, CWS, Kim on 05/24/2022 11:05:59 Prescription 05/24/2022 -------------------------------------------------------------------------------- Louann SjogrenGRIBBONS, Gene Brown. Stone, Tadd Holtmeyer Gene Brown-C Patient Name: Provider: 10/25/1950 [id number] Date of Birth: NPI#: Judie PetitM [id number] Sex: DEA #: [id number] Phone #: License #: [facility] Patient Address: [address] [facility] [place] [address] [address], [address] [place], [place] [address] [phone] Allergies Statins-Hmg-Coa Reductase Inhibitors Provider's Orders Patient to wear own Velcro compression garment. Remove compression stockings every night before going to bed and put on every morning when getting up. Hand Signature: Date(s): Electronic Signature(s) Signed: 05/24/2022 2:48:39 PM By: Elliot GurneyWoody, BSN, RN, CWS, Kim RN, BSN Signed: 05/24/2022 3:55:23 PM By: Lenda KelpStone III, Shantil Vallejo Gene Brown-C Entered By: Elliot GurneyWoody, BSN, RN, CWS, Kim on 05/24/2022 11:05:59 [name], Gene Brown ([id number]) [id number] Page 6 of 10 -------------------------------------------------------------------------------- Problem List Details Patient Name: Date of Service: Gene HeckGRIBBO Brown, [place] UL Brown. 05/24/2022 10:45 A M Medical Record Number: [id number] Patient Account Number: [id number] Date of Birth/Sex: Treating RN: 12/07/1950 (71 y.o. Gene HolmsM) Woody, Kim Primary Care Provider: Barbette ReichmannHande, Vishwanath Other Clinician: Referring Provider: Treating Provider/Extender: Jearld ShinesStone, Acelynn Dejonge Hande, Vishwanath Weeks in Treatment: 19 Active Problems ICD-10 Encounter Code Description Active Date MDM Diagnosis [phone] Other chronic osteomyelitis, left ankle  and foot 04/25/2022 No Yes E11.621 Type 2 diabetes mellitus with foot ulcer 01/07/2022 No Yes 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osed 01/07/2022 No Yes E11.42 Type 2 diabetes mellitus with diabetic polyneuropathy 01/07/2022 No Yes I10 Essential (primary) hypertension 01/07/2022 No Yes Inactive Problems Resolved Problems Electronic Signature(s) Signed: 05/24/2022 10:32:24 AM By: Lenda KelpStone III, Jamae Tison Gene Brown-C Entered By: Lenda KelpStone III, Alva Kuenzel on 05/24/2022 10:32:24 -------------------------------------------------------------------------------- Progress Note Details Patient Name: Date of Service: Gene HeckGRIBBO NS, Gene Brown UL Brown. 05/24/2022 10:45 A M Medical Record Number: [id number] Patient Account Number: [id number] Date of Birth/Sex: Treating RN: 12/01/1950 (71 y.o. Gene HolmsM) Woody, Kim Primary Care Provider: Barbette ReichmannHande, Vishwanath Other Clinician: [name], Gene Brown ([id number]) [id number] Page 7 of 10 Referring Provider: Treating Provider/Extender: Jearld ShinesStone, Chasey Dull Hande, Vishwanath Weeks in Treatment: 19 Subjective Chief Complaint Information obtained from Patient Left great toe ulcer History of Present Illness (HPI) 05/24/16; this is a 71 year old type II diabetic who probably has some degree of polyneuropathy. He tells me that he has had an area on the plantar aspect  of his left great toe that is been followed by podiatry for most the last year. He has a lot of callus, the podiatrist which shave this down put a dressing on and he would follow pad monthly or sometimes longer intervals. He has not noted any drainage or pain. He has not had a recent x-ray. No serious attempt at offloading this with modified footwear.. Last hemoglobin A1c that he knows about was over 10 a month ago he is working hard on this with his primary physician. More recently he has been soaking this with peroxide ABIs are noncompressible and the left leg. He was told by his podiatrist he has a  bone spur underneath the wound but did not wish any surgical procedures. He has a caregiver at home for a very disabled wife, the nature of her illness is not certain 05/31/16 patient comes in much the same as last week amounts of callus nonviable subcutaneous tissue which required debridement. His x-ray suggested cortical bone irregularity. He will need an MRI. He tells me he has old screws/hardware left knee however he thinks he has had a subsequent MRI after this 06/07/16 at this point in time patient has been attempting to offload his wound as much as he could on his own. He has made a conscious effort to avoid placing any pressure on the toe as he was moving around and walking and I do believe this has payed off and what I am seeing at this point in time. overall the wound appears to be somewhat improved in my opinion he is not having any significant discomfort at this point in time. In fact he tells me he has no pain at all. There is no interval sign of surrounding infection in the wound itself has actually decreased in size. Unfortunately the MRI was not ordered last week and therefore we do need to proceed with the MRI order today. again this is to evaluate the cortical bone irregularity noted on x-ray. 06/14/16 Patient has continued to use the offloading shoe at this point in time. He tells me this is somewhat difficult to get used to walking and but nonetheless he is very strict about wearing it. Again I believe this has paid off very well for him and his wound appears to be greatly improved. He does have some callused area surrounding the wound proximally that has caused some fluid collection of maceration of the callused area. This likely will require debridement today. Some adherent slough noted over the wound bed. 06/21/16; patient's wound on the plantar aspect of his left great toe is totally epithelialized. His MRI of the foot is tomorrow to follow-up on the cortical irregularity noted  on plain x-ray. He does not have diabetic foot where 06-28-16-He comes in today with a continued healed left great toe. MRI performed on 06/23/2016 shows no abscess osteomyelitis or septic joint. Patient was discharged from wound care center today. Readmission: 01-07-2022 upon evaluation today patient appears to be doing somewhat poorly in regard to total ulcer on his left foot. Fortunately there does not appear to be any signs of infection which is great news. Has been seen by Dr. Excell Seltzer and he did request from Dr. Excell Seltzer to come see [place] due to the fact that in the past several years back we were able to get one of his wounds healed about 4 weeks. Again I explained that that is always variable but nonetheless I definitely think we can help. Fortunately I see no evidence of  infection. He does have a foot offloading shoe which I think is good to attempt for now he was able to get this healed before with a peg assist. Patient does have a history of type 2 diabetes, diabetic polyneuropathy, and hypertension. This current wound started on 12-09-2021. 01-20-2022 upon evaluation today patient appears to be doing poorly in regard to his great toe. I am actually very concerned based on what I am seeing here at this point about how things are progressing. I do not believe there is any evidence of infection systemically that is obvious but locally I am concerned about the possibility this could even be an osteomyelitis type situation. 01-28-2022 upon evaluation today patient appears to be doing well with regard to his toe ulcer compared to what we were previous. Fortunately I do not see any signs of active infection at this time which is great news. No fevers, chills, nausea, vomiting, or diarrhea. With that being said the patient has not started his Augmentin yet which I definitely think he needs to get on board ASAP. I do see some signs of new skin growth I think he is using the front offloading shoe which is good  for the time being. 02-04-2022 upon evaluation today patient appears to be doing okay in regard to his toe ulcer this is actually measuring a little bit smaller but still has a bit of callus buildup but not as much as we have seen in the past. Fortunately I do not see any evidence of active infection locally or systemically which is great news. No fevers, chills, nausea, vomiting, or diarrhea. Marland Kitchen 02-14-2022 upon evaluation today patient's wound is actually showing signs of good improvement which is great news. Fortunately I do not see any evidence of active infection locally or systemically which is great news as well. No fevers, chills, nausea, vomiting, or diarrhea. 02-21-2022 upon evaluation today patient's toe ulcer actually is showing signs of excellent improvement. I do not see any evidence of infection locally or systemically which is great news and overall I am extremely pleased with where we stand today. I do not see any signs of active infection locally or systemically which is great news. No fevers, chills, nausea, vomiting, or diarrhea. 7/24; plantar left great toe no real improvement since last week. Using silver alginate gauze. He has a forefoot offloading boot. He also has lymphedema and uses his compression pumps I do not think this is adding any particular difficulties. It sounds as though he is still pretty active 03-07-2022 upon evaluation today patient appears to be doing excellent in regard to the wound compared to previous pictures. He still has a quite deep wound on the toe good news is the x-ray did not show any signs of osteomyelitis which is excellent. He does need some debridement today. 03-14-2022 upon evaluation today patient appears to be doing well currently in regard to his wound. He has actually been tolerating the dressing changes without complication. With that being said the dressing seems to either be draining a lot or getting very wet otherwise I think we need to have  him change this more frequently. 03-21-2022 upon evaluation today patient appears to be doing well with regard to his toe ulcer I am actually seeing signs of improvement which is great news. This is slow but nonetheless the skin around the edges of the wound are definitely improving as we proceed here. I do not see any evidence of active infection locally or systemically which is great  news. 03-29-2022 upon evaluation today patient appears to be doing fairly well except for with regard to his toe being somewhat red. I do think that we may want to place him back on the Augmentin which was previously beneficial for him. He is not in disagreement with this. Otherwise I did perform some debridement of clearway some of the necrotic debris today he seems to be tolerating this without complication. 04-04-2022 upon evaluation today patient unfortunately is continued to have significant issues here with his toe ulcer. He actually has an abscess more proximal on the dorsal aspect of the toe which is new since last week. Subsequently he is on antibiotics already I think we need to proceed with an MRI to further evaluate this. 04-12-2022 upon evaluation today patient appears to be doing better in regard to his toe ulcer. This actually is less red than what it was he is about done with the initial antibiotic he does has his MRI scheduled for Friday. The reason I am going to probably extend the Augmentin for the time being I will to make sure that we keep this under control especially if he potentially has a bone infection I do not going to be going without the antibiotic therapy. Patient is in agreement with Gene Brown, Gene Brown ([id number]) [id number] Page 8 of 10 that plan. 04-18-2022 upon evaluation today patient does have an MRI for review that was obtained on Sunday. He I am going to go through this with him today it did show that he has a septic joint and the interphalangeal joint as well  as proximal and distal phalanges osteomyelitis of the great toe. He has been on antibiotics I had him on Augmentin starting initially on January 25, 2022. Continuing to be a problem here. I do not see any signs of infection spreading up his foot but the toe itself still has purulent drainage coming from it. Coupled with this I have had him on the antibiotics, Augmentin, and he is tolerating that quite well. He is actually been on this since 01-20-2022. During that time his wound is getting somewhat better. But is still been very slow which is what prompted the MRI due to the fact that I felt like at a later point he started develop an abscess on the top of his toe that then had me more concerned. At this point I do believe that he is probably can require hyperbaric oxygen therapy I think that this is the ideal treatment plan for limb salvage coupled with the antibiotic therapy and good wound care. 04-25-2022 upon evaluation today patient appears to be doing okay in regard to his foot ulcers. He actually has an opening on the dorsal aspect of the toe which does not directly connect to the plantar aspect. This is still open in both locations he did have some callus covering that did require sharp debridement today to clear this away. Fortunately there does not appear to be any evidence of active infection at this point. 05-02-2022 upon evaluation today patient appears to be doing well in regard to his wounds. He has been tolerating the dressing changes without complication. With that being said he does have some evidence here of continued drainage. There is evidence of infection as well although I do believe that the medicine is helping to keep this under good control. His erythema is improved although the swelling of the great toe is still present. He has started HBO as of this morning. 05-10-2022 upon evaluation patient's ulcers on  his toe actually are appearing to do much better. I do think he is benefiting  from the hyperbarics as well as the antibiotics as well is good wound care. Overall I think that we have a good plan going and the patient is benefiting from this greatly which is also news. I do not see any signs of clinical active infection although obviously were still in the process of treating this osteomyelitis 05-17-2022 upon evaluation today patient's wounds are actually showing signs of good granulation and epithelization there is actually some callus covering To check to see whether this is still open or not. Nonetheless I do believe both wounds are significantly improved compared to previous and there is no erythema or redness-like his toe is doing better and I think he is doing well with the hyperbarics as well as with the oral antibiotics and wound care currently. 05-24-2022 upon evaluation today patient's toe was actually doing excellent. I am very pleased with where things stand and he is making great progress as far as I am concerned. I do not see any evidence of active infection locally or systemically which is great news. No fevers, chills, nausea, vomiting, or diarrhea. Objective Constitutional Well-nourished and well-hydrated in no acute distress. Vitals Time Taken: 10:39 AM, Height: 68 in, Weight: 217 lbs, BMI: 33, Temperature: 98.7 F, Pulse: 72 bpm, Respiratory Rate: 18 breaths/min, Blood Pressure: 122/80 mmHg. Respiratory normal breathing without difficulty. Psychiatric this patient is able to make decisions and demonstrates good insight into disease process. Alert and Oriented x 3. pleasant and cooperative. General Notes: Upon inspection patient's wound actually is closed on the plantar aspect of the toe which is great news on the dorsal aspect this is actually still showing signs of great improvement which I am very pleased about as well. In general I do believe that he is improving quite appropriately and I do believe the silver collagen has been doing quite  well. Integumentary (Hair, Skin) Wound #2 status is Healed - Epithelialized. Original cause of wound was Gradually Appeared. The date acquired was: 12/09/2021. The wound has been in treatment 19 weeks. The wound is located on the [employer]. The wound measures 0cm length x 0cm width x 0cm depth; 0cm^2 area and 0cm^3 oe volume. There is a medium amount of serosanguineous drainage noted. Wound #3 status is Open. Original cause of wound was Pressure Injury. The date acquired was: 03/25/2022. The wound has been in treatment 4 weeks. The wound is located on the Left,Dorsal T Great. The wound measures 0.3cm length x 0.3cm width x 0.4cm depth; 0.071cm^2 area and 0.028cm^3 volume. There oe is Fat Layer (Subcutaneous Tissue) exposed. There is a none present amount of drainage noted. The wound margin is distinct with the outline attached to the wound base. There is no granulation within the wound bed. There is no necrotic tissue within the wound bed. General Notes: Bone fragment found and removed in wound base. Assessment Active Problems ICD-10 Other chronic osteomyelitis, left ankle and foot Type 2 diabetes mellitus with foot ulcer Non-pressure chronic ulcer of other part of left foot with fat layer exposed Type 2 diabetes mellitus with diabetic polyneuropathy Essential (primary) hypertension Plan Gene Brown, Gene Brown ([id number]) [id number] Page 9 of 10 Bathing/ Shower/ Hygiene: May shower; gently cleanse wound with antibacterial soap, rinse and pat dry prior to dressing wounds No tub bath. Anesthetic (Use 'Patient Medications' Section for Anesthetic Order Entry): Lidocaine applied to wound bed Edema Control - Lymphedema / Segmental Compressive Device /  Other: Patient to wear own Velcro compression garment. Remove compression stockings every night before going to bed and put on every morning when getting up. Compression Pump: Use compression pump on left lower  extremity for 60 minutes, twice daily. Compression Pump: Use compression pump on right lower extremity for 60 minutes, twice daily. DO YOUR BEST to sleep in the bed at night. DO NOT sleep in your recliner. Long hours of sitting in a recliner leads to swelling of the legs and/or potential wounds on your backside. Off-Loading: Other: - front off-loader left Additional Orders / Instructions: Follow Nutritious Diet and Increase Protein Intake Hyperbaric Oxygen Therapy: Indication and location: - CRO Left great toe 2.0 ATA for 90 Minutes without Air Breaks One treatment per day (delivered Monday through Friday unless otherwise specified in Special Instructions below): T # of Treatments: - 40 otal Antihistamine 30 minutes prior to HBO Treatment, difficulty clearing ears. Finger stick Blood Glucose Pre- and Post- HBOT Treatment. Follow Hyperbaric Oxygen Glycemia Protocol WOUND #3: - T Great Wound Laterality: Dorsal, Left oe Prim Dressing: Prisma 4.34 (in) 1 x Per Day/15 Days ary Discharge Instructions: Moisten w/normal saline or sterile water; Cover wound as directed. Do not remove from wound bed. Secondary Dressing: Gauze 1 x Per Day/15 Days Discharge Instructions: As directed: dry, moistened with saline or moistened with Dakins Solution Secured With: Medipore T - 76M Medipore H Soft Cloth Surgical T ape ape, 2x2 (in/yd) 1 x Per Day/15 Days 1. I would recommend that we continue with the silver collagen which I think has been doing excellent for the time being. Patient is in agreement with that plan. 2. Also can recommend that we continue with the gauze to cover followed by the roll gauze to secure place. 3. I would suggest the patient continue to monitor for any signs of worsening if anything changes he knows to contact the office and let me know. 4. I am going to suggest as well we continue with HBO therapy which seems to be doing quite well. We will see patient back for reevaluation in 1  week here in the clinic. If anything worsens or changes patient will contact our office for additional recommendations. Electronic Signature(s) Signed: 05/24/2022 2:48:43 PM By: Lenda Kelp Gene Brown-C Entered By: Lenda Kelp on 05/24/2022 14:48:42 -------------------------------------------------------------------------------- SuperBill Details Patient Name: Date of Service: Gene Heck, Gene Brown UL Brown. 05/24/2022 Medical Record Number: [id number] Patient Account Number: [id number] Date of Birth/Sex: Treating RN: 02/21/1951 (71 y.o. Gene Brown Primary Care Provider: Barbette Reichmann Other Clinician: Referring Provider: Treating Provider/Extender: Jearld Shines Weeks in Treatment: 19 Diagnosis Coding ICD-10 Codes Code Description [phone] Other chronic osteomyelitis, left ankle and foot E11.621 Type 2 diabetes mellitus with foot ulcer 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osed E11.42 Type 2 diabetes mellitus with diabetic polyneuropathy I10 Essential (primary) hypertension LOUKAS, ANTONSON ([id number]) [id number] Page 10 of 10 Physician Procedures : CPT4 Code Description Modifier [id number] - WC PHYS LEVEL 3 - EST PT ICD-10 Diagnosis Description M86.672 Other chronic osteomyelitis, left ankle and foot E11.621 Type 2 diabetes mellitus with foot ulcer L97.522 Non-pressure chronic ulcer of other  part of left foot with fat layer exposed E11.42 Type 2 diabetes mellitus with diabetic polyneuropathy Quantity: 1 Electronic Signature(s) Signed: 05/24/2022 2:49:02 PM By: Lenda Kelp Gene Brown-C Entered By: Lenda Kelp on 05/24/2022 14:49:02

## 2022-05-24 NOTE — Progress Notes (Signed)
OLIN, GURSKI ([id number]) [id number] Page 1 of 2 Visit Report for 05/24/2022 HBO Details Patient Name: Date of Service: Gene Brown, [place] UL V. 05/24/2022 8:00 A M Medical Record Number: [id number] Patient Account Number: [id number] Date of Birth/Sex: Treating RN: [birth year] (71 y.o. [name], [name] Primary Care Freman Lapage: Tracie Harrier Other Clinician: Jacqulyn Bath Referring Araya Roel: Treating Brek Reece/Extender: [name] HBO Treatment Course Details Treatment Course Number: 1 Ordering Chade Pitner: Jeri Cos T Treatments Ordered: otal 40 HBO Treatment Start Date: 05/02/2022 HBO Indication: Chronic Refractory Osteomyelitis to Left Great Toe HBO Treatment Details Treatment Number: 17 Patient Type: Outpatient Chamber Type: Monoplace Chamber Serial #: [id number] Treatment Protocol: 2.0 ATA with 90 minutes oxygen, and no air breaks Treatment Details Compression Rate Down: 1.5 psi / minute De-Compression Rate Up: 1.5 psi / minute Air breaks and breathing Decompress Decompress Compress Tx Pressure Begins Reached periods Begins Ends (leave unused spaces blank) Chamber Pressure (ATA 1 2 ------2 1 ) Clock Time (24 hr) 08:28 08:40 - - - - - - 10:10 10:20 Treatment Length: 112 (minutes) Treatment Segments: 4 Vital Signs Capillary Blood Glucose Reference Range: 80 - 120 mg / dl HBO Diabetic Blood Glucose Intervention Range: <131 mg/dl or >249 mg/dl Type: Time Vitals Blood Respiratory Capillary Blood Glucose Pulse Action Pulse: Temperature: Taken: Pressure: Rate: Glucose (mg/dl): Meter #: Oximetry (%) Taken: Pre 08:08 138/68 72 18 97.7 189 1 none per protocol Post 10:39 122/80 72 18 97.8 111 1 none per protocol Treatment Response Treatment Toleration: Well Treatment Completion Status: Treatment Completed without Adverse Event HBO Attestation I certify that I supervised this HBO treatment in accordance with  Medicare guidelines. A trained emergency response team is readily available per Yes hospital policies and procedures. Continue HBOT as ordered. Yes Electronic Signature(s) Signed: 05/24/2022 3:53:15 PM By: Worthy Keeler PA-C Previous Signature: 05/24/2022 11:11:24 AM Version By: Enedina Finner RCP, RRT CHT , , Entered By: Worthy Keeler on 05/24/2022 15:53:15 [name] ([id number]) [id number] Page 2 of 2 -------------------------------------------------------------------------------- HBO Safety Checklist Details Patient Name: Date of Service: Gene Brown, [place] UL V. 05/24/2022 8:00 A M Medical Record Number: [id number] Patient Account Number: [id number] Date of Birth/Sex: Treating RN: 11-16-1950 (71 y.o. [name], [name] Primary Care Kaleel Schmieder: Tracie Harrier Other Clinician: Jacqulyn Bath Referring Hadasah Brugger: Treating Demonte Dobratz/Extender: [name] HBO Safety Checklist Items Safety Checklist Consent Form Signed Patient voided / foley secured and emptied When did you last eato 07:00 am Last dose of injectable or oral agent 07:00 am Ostomy pouch emptied and vented if applicable NA All implantable devices assessed, documented and approved NA Intravenous access site secured and place NA Valuables secured Linens and cotton and cotton/polyester blend (less than 51% polyester) Personal oil-based products / skin lotions / body lotions removed Wigs or hairpieces removed NA Smoking or tobacco materials removed NA Books / newspapers / magazines / loose paper removed NA Cologne, aftershave, perfume and deodorant removed Jewelry removed (may wrap wedding band) Make-up removed NA Hair care products removed Battery operated devices (external) removed NA Heating patches and chemical warmers removed NA Titanium eyewear removed NA Nail polish cured greater than 10 hours NA Casting material cured greater  than 10 hours NA Hearing aids removed NA Loose dentures or partials removed NA Prosthetics have been removed NA Patient demonstrates correct use of air break device (if applicable) Patient concerns have been addressed Patient grounding bracelet on and cord attached to chamber Specifics  for Inpatients (complete in addition to above) Medication sheet sent with patient Intravenous medications needed or due during therapy sent with patient Drainage tubes (e.g. nasogastric tube or chest tube secured and vented) Endotracheal or Tracheotomy tube secured Cuff deflated of air and inflated with saline Airway suctioned Electronic Signature(s) Signed: 05/24/2022 11:11:24 AM By: Aleda Grana RCP, RRT CHT , , Entered By: Aleda Grana on 05/24/2022 09:09:09 ,

## 2022-05-25 ENCOUNTER — Encounter (HOSPITAL_BASED_OUTPATIENT_CLINIC_OR_DEPARTMENT_OTHER): Payer: Medicare Other | Admitting: Internal Medicine

## 2022-05-25 DIAGNOSIS — E11621 Type 2 diabetes mellitus with foot ulcer: Secondary | ICD-10-CM | POA: Diagnosis not present

## 2022-05-25 DIAGNOSIS — L97522 Non-pressure chronic ulcer of other part of left foot with fat layer exposed: Secondary | ICD-10-CM | POA: Diagnosis not present

## 2022-05-25 DIAGNOSIS — M86672 Other chronic osteomyelitis, left ankle and foot: Secondary | ICD-10-CM

## 2022-05-25 DIAGNOSIS — E1169 Type 2 diabetes mellitus with other specified complication: Secondary | ICD-10-CM | POA: Diagnosis not present

## 2022-05-25 LAB — GLUCOSE, CAPILLARY
Glucose-Capillary: 198 mg/dL — ABNORMAL HIGH (ref 70–99)
Glucose-Capillary: 120 mg/dL — ABNORMAL HIGH (ref 70–99)

## 2022-05-25 NOTE — Progress Notes (Signed)
Gene Brown, Gene Brown ([id number]) [id number] Page 1 of 2 Visit Report for 05/25/2022 Arrival Information Details Patient Name: Date of Service: Gene Brown, [place] Brown V. 05/25/2022 8:00 A M Medical Record Number: [id number] Patient Account Number: [id number] Date of Birth/Sex: Treating RN: Oct 16, 1950 (71 y.o. [name], [name] Primary Care Lakai Moree: Tracie Harrier Other Clinician: Jacqulyn Bath Referring Dylin Breeden: Treating Jahmiya Guidotti/Extender: Arn Medal Weeks in Treatment: 19 Visit Information History Since Last Visit Added or deleted any medications: No Patient Arrived: Gene Brown Any new allergies or adverse reactions: No Arrival Time: 07:50 Had a fall or experienced change in No Accompanied By: self activities of daily living that may affect Transfer Assistance: None risk of falls: Patient Identification Verified: Yes Signs or symptoms of abuse/neglect since No Secondary Verification Process Completed: Yes last visito Patient Requires Transmission-Based No Hospitalized since last visit: No Precautions: Implantable device outside of the clinic No Patient Has Alerts: Yes excluding Patient Alerts: Patient on Blood Thinner cellular tissue based products placed in the 01/07/2022 >220 on left center since last visit: Has Dressing in [place] as Prescribed: Yes Has Footwear/Offloading in Place as Yes Prescribed: Left: Surgical Shoe with Pressure Relief Insole Pain Present Now: No Electronic Signature(s) Signed: 05/25/2022 11:47:26 AM By: Enedina Finner RCP, RRT CHT , , Entered By: Enedina Finner on 05/25/2022 08:29:55 , -------------------------------------------------------------------------------- Encounter Discharge Information Details Patient Name: Date of Service: Gene Brown, Gene Brown V. 05/25/2022 8:00 A M Medical Record Number: [id number] Patient Account Number: [id number] Date of Birth/Sex: Treating RN: 1950/10/24 (71 y.o.  [name] Primary Care Evin Chirco: Tracie Harrier Other Clinician: Jacqulyn Bath Referring Beverely Suen: Treating Brylyn Novakovich/Extender: Arn Medal Weeks in Treatment: 5 Encounter Discharge Information Items Discharge Condition: Stable Ambulatory Status: [facility] Discharge Destination: Home [name], [name] ([id number]) [id number] Page 2 of 2 Transportation: Private Auto Accompanied By: self Schedule Follow-up Appointment: Yes Clinical Summary of Care: Notes Patient has an HBO treatment scheduled on 1023/23 at 08:00 am. Electronic Signature(s) Signed: 05/25/2022 11:47:26 AM By: Enedina Finner RCP, RRT CHT , , Entered By: Enedina Finner on 05/25/2022 11:44:12 , -------------------------------------------------------------------------------- Vitals Details Patient Name: Date of Service: Gene Brown, Gene Brown V. 05/25/2022 8:00 A M Medical Record Number: [id number] Patient Account Number: [id number] Date of Birth/Sex: Treating RN: [birth year] (71 y.o. [name] Primary Care Aayush Gelpi: Tracie Harrier Other Clinician: Jacqulyn Bath Referring Danaka Llera: Treating Elliona Doddridge/Extender: Arn Medal Weeks in Treatment: 81 Vital Signs Time Taken: 08:05 Temperature (F): 97.9 Height (in): 68 Pulse (bpm): 72 Weight (lbs): 217 Respiratory Rate (breaths/min): 18 Body Mass Index (BMI): 33 Blood Pressure (mmHg): 128/70 Capillary Blood Glucose (mg/dl): 198 Reference Range: 80 - 120 mg / dl Electronic Signature(s) Signed: 05/25/2022 11:47:26 AM By: Enedina Finner RCP, RRT CHT , , Entered By: Enedina Finner on 05/25/2022 08:30:36 ,

## 2022-05-25 NOTE — Progress Notes (Signed)
THEOPHIL, THIVIERGE ([id number]) [id number] Page 1 of 2 Visit Report for 05/25/2022 HBO Details Patient Name: Date of Service: Gene Brown, Gene Brown UL V. 05/25/2022 8:00 A M Medical Record Number: [id number] Patient Account Number: [id number] Date of Birth/Sex: Treating RN: 04/23/1951 (71 y.o. 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 HBO Treatment Details Treatment Number: 18 Patient Type: Outpatient Chamber Type: Monoplace Chamber Serial #: [id number] Treatment Protocol: 2.0 ATA with 90 minutes oxygen, and no air breaks Treatment Details Compression Rate Down: 1.5 psi / minute De-Compression Rate Up: 1.5 psi / minute Air breaks and breathing Decompress Decompress Compress Tx Pressure Begins Reached periods Begins Ends (leave unused spaces blank) Chamber Pressure (ATA 1 2 ------2 1 ) Clock Time (24 hr) 08:16 08:28 - - - - - - 09:59 10:09 Treatment Length: 113 (minutes) Treatment Segments: 4 Vital Signs Capillary Blood Glucose Reference Range: 80 - 120 mg / dl HBO Diabetic Blood Glucose Intervention Range: <131 mg/dl or >939 mg/dl Type: Time Vitals Blood Respiratory Capillary Blood Glucose Pulse Action Pulse: Temperature: Taken: Pressure: Rate: Glucose (mg/dl): Meter #: Oximetry (%) Taken: Pre 08:05 128/70 72 18 97.9 198 1 none per protocol Post 10:30 122/80 72 18 97.7 120 1 none per protocol Treatment Response Treatment Toleration: Well Treatment Completion Status: Treatment Completed without Adverse Event HBO Attestation I certify that I supervised this HBO treatment in accordance  with Medicare guidelines. A trained emergency response team is readily available per Yes hospital policies and procedures. Continue HBOT as ordered. Yes Electronic Signature(s) Signed: 05/25/2022 11:58:41 AM By: Geralyn Corwin DO Previous Signature: 05/25/2022 11:47:26 AM Version By: Aleda Grana RCP, RRT CHT , , Entered By: Geralyn Corwin on 05/25/2022 11:58:26 [name] ([id number]) [id number] Page 2 of 2 -------------------------------------------------------------------------------- HBO Safety Checklist Details Patient Name: Date of Service: Gene Brown, Gene Brown UL V. 05/25/2022 8:00 A M Medical Record Number: [id number] Patient Account Number: [id number] Date of Birth/Sex: Treating RN: May 13, 1951 (71 y.o. [name], [name] Primary Care Wenzel Backlund: Barbette Reichmann Other Clinician: Izetta Dakin Referring Ashrith Sagan: Treating Corrinne Benegas/Extender: Adolph Pollack Weeks in Treatment: 19 HBO Safety Checklist Items Safety Checklist Consent Form Signed Patient voided / foley secured and emptied When did you last eato 07:00 am Last dose of injectable or oral agent 07:00 am Ostomy pouch emptied and vented if applicable NA All implantable devices assessed, documented and approved NA Intravenous access site secured and place NA Valuables secured Linens and cotton and cotton/polyester blend (less than 51% polyester) Personal oil-based products / skin lotions / body lotions removed Wigs or hairpieces removed NA Smoking or tobacco materials removed NA Books / newspapers / magazines / loose paper removed NA Cologne, aftershave, perfume and deodorant removed Jewelry removed (may wrap wedding band) Make-up removed NA Hair care products removed Battery operated devices (external) removed NA Heating patches and chemical warmers removed NA Titanium eyewear removed NA Nail polish cured greater than 10 hours NA Casting material  cured greater than 10 hours NA Hearing aids removed NA Loose dentures or partials removed NA Prosthetics have been removed NA Patient demonstrates correct use of air break device (if applicable) Patient concerns have been addressed Patient grounding bracelet on and cord attached to chamber Specifics for Inpatients (  complete in addition to above) Medication sheet sent with patient Intravenous medications needed or due during therapy sent with patient Drainage tubes (e.g. nasogastric tube or chest tube secured and vented) Endotracheal or Tracheotomy tube secured Cuff deflated of air and inflated with saline Airway suctioned Electronic Signature(s) Signed: 05/25/2022 11:47:26 AM By: Enedina Finner RCP, RRT CHT , , Entered By: Enedina Finner on 05/25/2022 08:31:27 ,

## 2022-05-30 ENCOUNTER — Encounter: Payer: Medicare Other | Admitting: Physician Assistant

## 2022-05-30 DIAGNOSIS — E1169 Type 2 diabetes mellitus with other specified complication: Secondary | ICD-10-CM | POA: Diagnosis not present

## 2022-05-30 LAB — GLUCOSE, CAPILLARY
Glucose-Capillary: 189 mg/dL — ABNORMAL HIGH (ref 70–99)
Glucose-Capillary: 83 mg/dL (ref 70–99)

## 2022-05-30 NOTE — Progress Notes (Signed)
DOMANIQUE, LUCKETT ([id number]) [id number] Page 1 of 2 Visit Report for 05/30/2022 Arrival Information Details Patient Name: Date of Service: [name], [place] UL V. 05/30/2022 8:00 A M Medical Record Number: [id number] Patient Account Number: [id number] Date of Birth/Sex: Treating RN: 05/21/1951 (71 y.o. [name], [name] Primary Care Ivie Savitt: Tracie Harrier Other Clinician: Jacqulyn Bath Referring Jozee Hammer: Treating Jaeli Grubb/Extender: Solon Palm Weeks in Treatment: 20 Visit Information History Since Last Visit Added or deleted any medications: No Patient Arrived: Kasandra Knudsen Any new allergies or adverse reactions: No Arrival Time: 07:50 Had a fall or experienced change in No Accompanied By: self activities of daily living that may affect Transfer Assistance: None risk of falls: Patient Identification Verified: Yes Signs or symptoms of abuse/neglect since No Secondary Verification Process Completed: Yes last visito Patient Requires Transmission-Based No Hospitalized since last visit: No Precautions: Implantable device outside of the clinic No Patient Has Alerts: Yes excluding Patient Alerts: Patient on Blood Thinner cellular tissue based products placed in the 01/07/2022 [place]>220 on left center since last visit: Has Dressing in [place] as Prescribed: Yes Has Footwear/Offloading in Place as Yes Prescribed: Left: Surgical Shoe with Pressure Relief Insole Pain Present Now: No Electronic Signature(s) Signed: 05/30/2022 12:43:09 PM By: Enedina Finner RCP, RRT CHT , , Entered By: Enedina Finner on 05/30/2022 08:27:30 , -------------------------------------------------------------------------------- Encounter Discharge Information Details Patient Name: Date of Service: [name], [name] 05/30/2022 8:00 A M Medical Record Number: [id number] Patient Account Number: [id number] Date of Birth/Sex: Treating RN: May 22, 1951 (71 y.o. [name] Primary Care Hardeep Reetz: Tracie Harrier Other Clinician: Jacqulyn Bath Referring Severina Sykora: Treating Avabella Wailes/Extender: Solon Palm Weeks in Treatment: 20 Encounter Discharge Information Items Discharge Condition: Stable Ambulatory Status: [facility] Discharge Destination: Home ARIK, HUSMANN ([id number]) [id number] Page 2 of 2 Transportation: Private Auto Accompanied By: self Schedule Follow-up Appointment: Yes Clinical Summary of Care: Notes Patient has an HBO treatment scheduled on 05/31/22 at 08:00 am. Electronic Signature(s) Signed: 05/30/2022 12:43:09 PM By: Enedina Finner RCP, RRT CHT , , Entered By: Enedina Finner on 05/30/2022 12:40:31 , -------------------------------------------------------------------------------- Vitals Details Patient Name: Date of Service: [name], [name] 05/30/2022 8:00 A M Medical Record Number: [id number] Patient Account Number: [id number] Date of Birth/Sex: Treating RN: 1951-01-01 (71 y.o. [name] Primary Care Dyamond Tolosa: Tracie Harrier Other Clinician: Jacqulyn Bath Referring Unice Vantassel: Treating Emaya Preston/Extender: Solon Palm Weeks in Treatment: 20 Vital Signs Time Taken: 08:07 Temperature (F): 97.7 Height (in): 68 Pulse (bpm): 66 Weight (lbs): 217 Respiratory Rate (breaths/min): 18 Body Mass Index (BMI): 33 Blood Pressure (mmHg): 122/70 Capillary Blood Glucose (mg/dl): 189 Reference Range: 80 - 120 mg / dl Electronic Signature(s) Signed: 05/30/2022 12:43:09 PM By: Enedina Finner RCP, RRT CHT , , Entered By: Enedina Finner on 05/30/2022 08:34:38 ,

## 2022-05-31 ENCOUNTER — Encounter: Payer: Medicare Other | Admitting: Physician Assistant

## 2022-05-31 DIAGNOSIS — E1169 Type 2 diabetes mellitus with other specified complication: Secondary | ICD-10-CM | POA: Diagnosis not present

## 2022-05-31 LAB — GLUCOSE, CAPILLARY
Glucose-Capillary: 166 mg/dL — ABNORMAL HIGH (ref 70–99)
Glucose-Capillary: 96 mg/dL (ref 70–99)

## 2022-05-31 NOTE — Progress Notes (Signed)
LUVERNE, ZERKLE ([id number]) [id number] Page 1 of 2 Visit Report for 05/31/2022 Arrival Information Details Patient Name: Date of Service: Gene Brown, [place] UL V. 05/31/2022 8:00 A M Medical Record Number: [id number] Patient Account Number: [id number] Date of Birth/Sex: Treating RN: November 21, 1950 (71 y.o. Gene Brown, Gene Brown Primary Care Arris Meyn: Tracie Harrier Other Clinician: Jacqulyn Bath Referring Kanden Carey: Treating Dailon Sheeran/Extender: Solon Palm Weeks in Treatment: 20 Visit Information History Since Last Visit Added or deleted any medications: No Patient Arrived: Kasandra Knudsen Any new allergies or adverse reactions: No Arrival Time: 07:55 Had a fall or experienced change in No Accompanied By: self activities of daily living that may affect Transfer Assistance: None risk of falls: Patient Identification Verified: Yes Signs or symptoms of abuse/neglect since No Secondary Verification Process Completed: Yes last visito Patient Requires Transmission-Based No Hospitalized since last visit: No Precautions: Implantable device outside of the clinic No Patient Has Alerts: Yes excluding Patient Alerts: Patient on Blood Thinner cellular tissue based products placed in the 01/07/2022 [place]>220 on left center since last visit: Has Dressing in [place] as Prescribed: Yes Has Footwear/Offloading in Place as Yes Prescribed: Left: Surgical Shoe with Pressure Relief Insole Pain Present Now: No Electronic Signature(s) Signed: 05/31/2022 1:22:43 PM By: Enedina Finner RCP, RRT CHT , , Entered By: Enedina Finner on 05/31/2022 10:06:12 , -------------------------------------------------------------------------------- Encounter Discharge Information Details Patient Name: Date of Service: [name], [name] 05/31/2022 8:00 A M Medical Record Number: [id number] Patient Account Number: [id number] Date of Birth/Sex: Treating RN: 06-Apr-1951 (71 y.o. Gene Brown Primary Care Luisa Louk: Tracie Harrier Other Clinician: Jacqulyn Bath Referring Dontai Pember: Treating Everardo Voris/Extender: Solon Palm Weeks in Treatment: 20 Encounter Discharge Information Items Discharge Condition: Stable Ambulatory Status: [facility] Discharge Destination: Home Gene, Brown ([id number]) [id number] Page 2 of 2 Transportation: Private Auto Accompanied By: self Schedule Follow-up Appointment: Yes Clinical Summary of Care: Notes Patient has an HBO treatment scheduled on 06/01/22 at 08:00 am. Electronic Signature(s) Signed: 05/31/2022 1:22:43 PM By: Enedina Finner RCP, RRT CHT , , Entered By: Enedina Finner on 05/31/2022 13:22:23 , -------------------------------------------------------------------------------- Vitals Details Patient Name: Date of Service: [name], [name] 05/31/2022 8:00 A M Medical Record Number: [id number] Patient Account Number: [id number] Date of Birth/Sex: Treating RN: May 01, 1951 (71 y.o. Gene Brown Primary Care Ilir Mahrt: Tracie Harrier Other Clinician: Jacqulyn Bath Referring Adelise Buswell: Treating Tala Eber/Extender: Solon Palm Weeks in Treatment: 20 Vital Signs Time Taken: 08:08 Temperature (F): 98.6 Height (in): 68 Pulse (bpm): 72 Weight (lbs): 217 Respiratory Rate (breaths/min): 18 Body Mass Index (BMI): 33 Blood Pressure (mmHg): 118/72 Capillary Blood Glucose (mg/dl): 166 Reference Range: 80 - 120 mg / dl Electronic Signature(s) Signed: 05/31/2022 1:22:43 PM By: Enedina Finner RCP, RRT CHT , , Entered By: Enedina Finner on 05/31/2022 10:06:43 ,

## 2022-05-31 NOTE — Progress Notes (Signed)
Gene, Brown ([id number]) [id number] Page 1 of 9 Visit Report for 05/31/2022 Arrival Information Details Patient Name: Date of Service: Gene Brown, [place] UL V. 05/31/2022 10:45 A M Medical Record Number: [id number] Patient Account Number: [id number] Date of Birth/Sex: Treating RN: November 21, 1950 (71 y.o. [name] Primary Care Naythan Douthit: Tracie Harrier Other Clinician: Referring Nashon Erbes: Treating Hiep Ollis/Extender: Solon Palm Weeks in Treatment: 20 Visit Information History Since Last Visit Added or deleted any medications: No Patient Arrived: [name] Arrival Time: 10:44 Pain Present Now: No Accompanied By: self Transfer Assistance: None Patient Identification Verified: Yes Secondary Verification Process Completed: Yes Patient Requires Transmission-Based Precautions: No Patient Has Alerts: Yes Patient Alerts: Patient on Blood Thinner 01/07/2022 [place]>220 on left Electronic Signature(s) Signed: 05/31/2022 5:27:43 PM By: [name], BSN, RN, CWS, Kim RN, BSN Entered By: [name], BSN, RN, CWS, Kim on 05/31/2022 10:44:20 -------------------------------------------------------------------------------- Clinic Level of Care Assessment Details Patient Name: Date of Service: Gene NS, [name] 05/31/2022 10:45 A M Medical Record Number: [id number] Patient Account Number: [id number] Date of Birth/Sex: Treating RN: Sep 16, 1950 (71 y.o. [name] Primary Care Syed Zukas: Tracie Harrier Other Clinician: Referring Moani Weipert: Treating Shaiden Aldous/Extender: Solon Palm Weeks in Treatment: 20 Clinic Level of Care Assessment Items TOOL 4 Quantity Score []  - 0 Use when only an EandM is performed on FOLLOW-UP visit ASSESSMENTS - Nursing Assessment / Reassessment X- 1 10 Reassessment of Co-morbidities (includes updates in patient status) X- 1 5 Reassessment of Adherence to Treatment Plan ASSESSMENTS -  Wound and Skin A ssessment / Reassessment X - Simple Wound Assessment / Reassessment - one wound 1 5 TERVON, [name] ([id number]) [id number] Page 2 of 9 []  - 0 Complex Wound Assessment / Reassessment - multiple wounds []  - 0 Dermatologic / Skin Assessment (not related to wound area) ASSESSMENTS - Focused Assessment []  - 0 Circumferential Edema Measurements - multi extremities []  - 0 Nutritional Assessment / Counseling / Intervention []  - 0 Lower Extremity Assessment (monofilament, tuning fork, pulses) []  - 0 Peripheral Arterial Disease Assessment (using hand held doppler) ASSESSMENTS - Ostomy and/or Continence Assessment and Care []  - 0 Incontinence Assessment and Management []  - 0 Ostomy Care Assessment and Management (repouching, etc.) PROCESS - Coordination of Care X - Simple Patient / Family Education for ongoing care 1 15 []  - 0 Complex (extensive) Patient / Family Education for ongoing care X- 1 10 Staff obtains Programmer, systems, Records, T Results / Process Orders est []  - 0 Staff telephones HHA, Nursing Homes / Clarify orders / etc []  - 0 Routine Transfer to another Facility (non-emergent condition) []  - 0 Routine Hospital Admission (non-emergent condition) []  - 0 New Admissions / Biomedical engineer / Ordering NPWT Apligraf, etc. , []  - 0 Emergency Hospital Admission (emergent condition) X- 1 10 Simple Discharge Coordination []  - 0 Complex (extensive) Discharge Coordination PROCESS - Special Needs []  - 0 Pediatric / Minor Patient Management []  - 0 Isolation Patient Management []  - 0 Hearing / Language / Visual special needs []  - 0 Assessment of Community assistance (transportation, D/C planning, etc.) []  - 0 Additional assistance / Altered mentation []  - 0 Support Surface(s) Assessment (bed, cushion, seat, etc.) INTERVENTIONS - Wound Cleansing / Measurement X - Simple Wound Cleansing - one wound 1 5 []  - 0 Complex Wound  Cleansing - multiple wounds X- 1 5 Wound Imaging (photographs - any number of wounds) []  - 0 Wound Tracing (instead of photographs) X- 1 5 Simple Wound  Measurement - one wound []  - 0 Complex Wound Measurement - multiple wounds INTERVENTIONS - Wound Dressings X - Small Wound Dressing one or multiple wounds 1 10 []  - 0 Medium Wound Dressing one or multiple wounds []  - 0 Large Wound Dressing one or multiple wounds []  - 0 Application of Medications - topical []  - 0 Application of Medications - injection INTERVENTIONS - Miscellaneous []  - 0 External ear exam []  - 0 Specimen Collection (cultures, biopsies, blood, body fluids, etc.) []  - 0 Specimen(s) / Culture(s) sent or taken to Lab for analysis [name], [name] ([id number]) [id number] Page 3 of 9 []  - 0 Patient Transfer (multiple staff / Civil Service fast streamer / Similar devices) []  - 0 Simple Staple / Suture removal (25 or less) []  - 0 Complex Staple / Suture removal (26 or more) []  - 0 Hypo / Hyperglycemic Management (close monitor of Blood Glucose) []  - 0 Ankle / Brachial Index (ABI) - do not check if billed separately X- 1 5 Vital Signs Has the patient been seen at the hospital within the last three years: Yes Total Score: 85 Level Of Care: New/Established - Level 3 Electronic Signature(s) Signed: 05/31/2022 5:27:43 PM By: [name], BSN, RN, CWS, Kim RN, BSN Entered By: [name], BSN, RN, CWS, Kim on 05/31/2022 11:12:01 -------------------------------------------------------------------------------- Encounter Discharge Information Details Patient Name: Date of Service: Gene NS, [name] 05/31/2022 10:45 A M Medical Record Number: [id number] Patient Account Number: [id number] Date of Birth/Sex: Treating RN: [birth year] (71 y.o. [name] Primary Care Loomis Anacker: Tracie Harrier Other Clinician: Referring Trejuan Matherne: Treating Tylen Leverich/Extender: Solon Palm Weeks in Treatment:  20 Encounter Discharge Information Items Discharge Condition: Stable Ambulatory Status: Cane Discharge Destination: Home Transportation: Private Auto Accompanied By: self Schedule Follow-up Appointment: Yes Clinical Summary of Care: Electronic Signature(s) Signed: 05/31/2022 5:27:43 PM By: [name], BSN, RN, CWS, Kim RN, BSN Entered By: [name], BSN, RN, CWS, Kim on 05/31/2022 11:13:07 -------------------------------------------------------------------------------- Lower Extremity Assessment Details Patient Name: Date of Service: Gene Bake, [name] 05/31/2022 10:45 A M Medical Record Number: [id number] Patient Account Number: [id number] Date of Birth/Sex: Treating RN: 1951-01-30 (71 y.o. [name] Primary Care Taila Basinski: Tracie Harrier Other Clinician: GAELAN, JASMINE ([id number]) [id number] Page 4 of 9 Referring Saahil Herbster: Treating Jones Viviani/Extender: Nadara Mustard, Vishwanath Weeks in Treatment: 20 Edema Assessment Assessed: [Left: No] [Right: No] [Left: Edema] [Right: :] Calf Left: Right: Point of Measurement: 38 cm From Medial Instep 43.5 cm Ankle Left: Right: Point of Measurement: 11 cm From Medial Instep 28.4 cm Vascular Assessment Pulses: Dorsalis Pedis Palpable: [Left:Yes] Electronic Signature(s) Signed: 05/31/2022 5:27:43 PM By: [name], BSN, RN, CWS, Kim RN, BSN Entered By: [name], BSN, RN, CWS, Kim on 05/31/2022 11:07:50 -------------------------------------------------------------------------------- Multi Wound Chart Details Patient Name: Date of Service: Gene Bake, [name] 05/31/2022 10:45 A M Medical Record Number: [id number] Patient Account Number: [id number] Date of Birth/Sex: Treating RN: Jan 03, 1951 (71 y.o. [name] Primary Care Taelor Waymire: Tracie Harrier Other Clinician: Referring Faye Sanfilippo: Treating Trystyn Sitts/Extender: Solon Palm Weeks in Treatment: 20 Vital Signs Height(in): 68 Pulse(bpm):  78 Weight(lbs): 217 Blood Pressure(mmHg): 138/82 Body Mass Index(BMI): 33 Temperature(F): 98.2 Respiratory Rate(breaths/min): 18 [3:Photos:] [N/A:N/A] Left, Dorsal T Great oe N/A N/A Wound Location: Pressure Injury N/A N/A Wounding Event: Diabetic Wound/Ulcer of the Lower N/A N/A Primary Etiology: Extremity Cataracts, Sickle Cell Disease, Sleep N/A N/A Comorbid History: Apnea, Hypertension, Type II ERMON, RUTMAN V ([id number]) [id number] Page 5 of 9 Diabetes, Gout, Osteoarthritis 03/25/2022 N/A N/A Date Acquired: 5  N/A N/A Weeks of Treatment: Healed - Epithelialized N/A N/A Wound Status: No N/A N/A Wound Recurrence: 0x0x0 N/A N/A Measurements L x W x D (cm) 0 N/A N/A A (cm) : rea 0 N/A N/A Volume (cm) : 100.00% N/A N/A % Reduction in A rea: 100.00% N/A N/A % Reduction in Volume: Grade 3 N/A N/A Classification: None Present N/A N/A Exudate A mount: Distinct, outline attached N/A N/A Wound Margin: None Present (0%) N/A N/A Granulation A mount: None Present (0%) N/A N/A Necrotic A mount: Fascia: No N/A N/A Exposed Structures: Fat Layer (Subcutaneous Tissue): No Tendon: No Muscle: No Joint: No Bone: No Large (67-100%) N/A N/A Epithelialization: Treatment Notes Electronic Signature(s) Signed: 05/31/2022 5:27:43 PM By: [name], BSN, RN, CWS, Kim RN, BSN Entered By: [name], BSN, RN, CWS, Kim on 05/31/2022 11:08:18 -------------------------------------------------------------------------------- Multi-Disciplinary Care Plan Details Patient Name: Date of Service: Gene Bake, [name] 05/31/2022 10:45 A M Medical Record Number: [id number] Patient Account Number: [id number] Date of Birth/Sex: Treating RN: 21-Oct-1950 (71 y.o. [name] Primary Care Osbaldo Mark: Tracie Harrier Other Clinician: Referring Lenox Ladouceur: Treating Ainsley Deakins/Extender: Solon Palm Weeks in Treatment: 20 Active Inactive HBO Nursing  Diagnoses: Anxiety related to feelings of confinement associated with the hyperbaric oxygen chamber Anxiety related to knowledge deficit of hyperbaric oxygen therapy and treatment procedures Discomfort related to temperature and humidity changes inside hyperbaric chamber Potential for barotraumas to ears, sinuses, teeth, and lungs or cerebral gas embolism related to changes in atmospheric pressure inside hyperbaric oxygen chamber Potential for oxygen toxicity seizures related to delivery of 100% oxygen at an increased atmospheric pressure Potential for pulmonary oxygen toxicity related to delivery of 100% oxygen at an increased atmospheric pressure Goals: Barotrauma will be prevented during HBO2 Date Initiated: 05/10/2022 Target Resolution Date: 05/02/2022 Goal Status: Active Patient and/or family will be able to state/discuss factors appropriate to the management of their disease process during treatment Date Initiated: 05/10/2022 Target Resolution Date: 05/02/2022 Goal Status: Active Patient will tolerate the hyperbaric oxygen therapy treatment Date Initiated: 05/10/2022 Target Resolution Date: 05/02/2022 Goal Status: Active MARKS, LANGOLF ([id number]) [id number] Page 6 of 9 Patient will tolerate the internal climate of the chamber Date Initiated: 05/10/2022 T arget Resolution Date: 05/02/2022 Goal Status: Active Patient/caregiver will verbalize understanding of HBO goals, rationale, procedures and potential hazards Date Initiated: 05/10/2022 T arget Resolution Date: 05/02/2022 Goal Status: Active Signs and symptoms of pulmonary oxygen toxicity will be recognized and promptly addressed Date Initiated: 05/10/2022 T arget Resolution Date: 05/02/2022 Goal Status: Active Signs and symptoms of seizure will be recognized and promptly addressed ; seizing patients will suffer no harm Date Initiated: 05/10/2022 T arget Resolution Date: 05/02/2022 Goal Status:  Active Interventions: Administer a five (5) minute air break for patient if signs and symptoms of seizure appear and notify the hyperbaric physician Administer decongestants, per physician orders, prior to HBO2 Administer the correct therapeutic gas delivery based on the patients needs and limitations, per physician order Assess and provide for patients comfort related to the hyperbaric environment and equalization of middle ear Assess for signs and symptoms related to adverse events, including but not limited to confinement anxiety, pneumothorax, oxygen toxicity and baurotrauma Assess patient for any history of confinement anxiety Assess patient's knowledge and expectations regarding hyperbaric medicine and provide education related to the hyperbaric environment, goals of treatment and prevention of adverse events Implement protocols to decrease risk of pneumothorax in high risk patients Notes: Peripheral Neuropathy Nursing Diagnoses: Knowledge deficit related to disease process and  management of peripheral neurovascular dysfunction Potential alteration in peripheral tissue perfusion (select prior to confirmation of diagnosis) Goals: Patient/caregiver will verbalize understanding of disease process and disease management Date Initiated: 01/07/2022 Target Resolution Date: 01/07/2022 Goal Status: Active Interventions: Assess signs and symptoms of neuropathy upon admission and as needed Provide education on Management of Neuropathy and Related Ulcers Provide education on Management of Neuropathy upon discharge from the [facility] Notes: Electronic Signature(s) Signed: 05/31/2022 5:27:43 PM By: [name], BSN, RN, CWS, Kim RN, BSN Entered By: [name], BSN, RN, CWS, Kim on 05/31/2022 11:08:09 -------------------------------------------------------------------------------- Pain Assessment Details Patient Name: Date of Service: Gene Bake, [name] 05/31/2022 10:45 A M Medical Record Number:  [id number] Patient Account Number: [id number] Date of Birth/Sex: Treating RN: 11-Sep-1950 (71 y.o. [name] Primary Care Prynce Jacober: Tracie Harrier Other Clinician: Referring Celedonio Sortino: Treating Gabrial Poppell/Extender: Solon Palm Weeks in Treatment: 20 Active Problems Location of Pain Severity and Description of Pain Patient Has Paino No RASHEED, WELTY ([id number]) [id number] Page 7 of 9 Patient Has Paino No Site Locations Pain Management and Medication Current Pain Management: Notes Patient denies pain at this time Electronic Signature(s) Signed: 05/31/2022 5:27:43 PM By: [name], BSN, RN, CWS, Kim RN, BSN Entered By: [name], BSN, RN, CWS, Kim on 05/31/2022 10:45:06 -------------------------------------------------------------------------------- Patient/Caregiver Education Details Patient Name: Date of Service: Gene Bake, [name] 10/24/2023andnbsp10:45 A M Medical Record Number: [id number] Patient Account Number: [id number] Date of Birth/Gender: Treating RN: 01/28/1951 (71 y.o. [name] Primary Care Physician: Tracie Harrier Other Clinician: Referring Physician: Treating Physician/Extender: Solon Palm Weeks in Treatment: 20 Education Assessment Education Provided To: Patient Education Topics Provided Offloading: Handouts: Other: continue to wear offloading shoe for 2 weeks Methods: Explain/Verbal Responses: State content correctly Electronic Signature(s) Signed: 05/31/2022 5:27:43 PM By: [name], BSN, RN, CWS, Kim RN, BSN Entered By: [name], BSN, RN, CWS, Kim on 05/31/2022 11:12:38 [name] ([id number]) [id number] Page 8 of 9 -------------------------------------------------------------------------------- Wound Assessment Details Patient Name: Date of Service: Gene Brown, [place] UL V. 05/31/2022 10:45 A M Medical Record Number: [id number] Patient Account Number:  [id number] Date of Birth/Sex: Treating RN: 01/14/1951 (71 y.o. [name] Primary Care Janiyla Long: Tracie Harrier Other Clinician: Referring Shemar Plemmons: Treating Shaterica Mcclatchy/Extender: Solon Palm Weeks in Treatment: 20 Wound Status Wound Number: 3 Primary Diabetic Wound/Ulcer of the Lower Extremity Etiology: Wound Location: Left, Dorsal T Great oe Wound Healed - Epithelialized Wounding Event: Pressure Injury Status: Date Acquired: 03/25/2022 Comorbid Cataracts, Sickle Cell Disease, Sleep Apnea, Hypertension, Type Weeks Of Treatment: 5 History: II Diabetes, Gout, Osteoarthritis Clustered Wound: No Photos Wound Measurements Length: (cm) Width: (cm) Depth: (cm) Area: (cm) Volume: (cm) 0 % Reduction in Area: 100% 0 % Reduction in Volume: 100% 0 Epithelialization: Large (67-100%) 0 0 Wound Description Classification: Grade 3 Wound Margin: Distinct, outline attached Exudate Amount: None Present Foul Odor After Cleansing: No Slough/Fibrino No Wound Bed Granulation Amount: None Present (0%) Exposed Structure Necrotic Amount: None Present (0%) Fascia Exposed: No Fat Layer (Subcutaneous Tissue) Exposed: No Tendon Exposed: No Muscle Exposed: No Joint Exposed: No Bone Exposed: No Treatment Notes Wound #3 (Toe Great) Wound Laterality: Dorsal, Left Cleanser Peri-Wound Care Topical Primary Dressing ROB, MCIVER ([id number]) [id number] Page 9 of 9 Secondary Dressing Secured With Compression Wrap Compression Stockings Environmental education officer) Signed: 05/31/2022 5:27:43 PM By: [name], BSN, RN, CWS, Kim RN, BSN Entered By: [name], BSN, RN, CWS, Kim on 05/31/2022 11:07:18 -------------------------------------------------------------------------------- Vitals Details Patient Name: Date of Service: Gene NS, PA  UL V. 05/31/2022 10:45 A M Medical Record Number: [id number] Patient Account Number: [id number] Date of  Birth/Sex: Treating RN: [birth year] (71 y.o. [name] Primary Care Colon Rueth: Tracie Harrier Other Clinician: Referring Shadee Montoya: Treating Taos Tapp/Extender: Solon Palm Weeks in Treatment: 20 Vital Signs Time Taken: 10:44 Temperature (F): 98.2 Height (in): 68 Pulse (bpm): 78 Weight (lbs): 217 Respiratory Rate (breaths/min): 18 Body Mass Index (BMI): 33 Blood Pressure (mmHg): 138/82 Reference Range: 80 - 120 mg / dl Electronic Signature(s) Signed: 05/31/2022 5:27:43 PM By: [name], BSN, RN, CWS, Kim RN, BSN Entered By: [name], BSN, RN, CWS, Kim on 05/31/2022 10:44:47

## 2022-05-31 NOTE — Progress Notes (Signed)
RUSHIL, KIMBRELL ([id number]) [id number] Page 1 of 2 Visit Report for 05/31/2022 Chief Complaint Document Details Patient Name: Date of Service: Gene Brown, [place] UL V. 05/31/2022 10:45 A M Medical Record Number: [id number] Patient Account Number: [id number] Date of Birth/Sex: Treating RN: [birth year] (71 y.o. [name] Primary Care Provider: Tracie Harrier Other Clinician: Referring Provider: Treating Provider/Extender: Solon Palm Weeks in Treatment: 20 Information Obtained from: Patient Chief Complaint Left great toe ulcer Electronic Signature(s) Signed: 05/31/2022 11:04:22 AM By: Worthy Keeler PA-C Entered By: Worthy Keeler on 05/31/2022 11:04:22 -------------------------------------------------------------------------------- Problem List Details Patient Name: Date of Service: Gene Bake, [name] 05/31/2022 10:45 A M Medical Record Number: [id number] Patient Account Number: [id number] Date of Birth/Sex: Treating RN: Dec 29, 1950 (71 y.o. 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

## 2022-06-01 ENCOUNTER — Encounter: Payer: Medicare Other | Admitting: Internal Medicine

## 2022-06-01 DIAGNOSIS — E1169 Type 2 diabetes mellitus with other specified complication: Secondary | ICD-10-CM | POA: Diagnosis not present

## 2022-06-01 LAB — GLUCOSE, CAPILLARY
Glucose-Capillary: 168 mg/dL — ABNORMAL HIGH (ref 70–99)
Glucose-Capillary: 117 mg/dL — ABNORMAL HIGH (ref 70–99)

## 2022-06-01 NOTE — Progress Notes (Signed)
Gene Brown ([id number]) [id number] Page 1 of 2 Visit Report for 05/30/2022 HBO Details Patient Name: Date of Service: Gene Brown, Gene Brown UL V. 05/30/2022 8:00 A M Medical Record Number: [id number] Patient Account Number: [id number] Date of Birth/Sex: Treating RN: August 25, 1950 (71 y.o. 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 HBO Treatment Details Treatment Number: 19 Patient Type: Outpatient Chamber Type: Monoplace Chamber Serial #: [id number] Treatment Protocol: 2.0 ATA with 90 minutes oxygen, and no air breaks Treatment Details Compression Rate Down: 1.5 psi / minute De-Compression Rate Up: 1.5 psi / minute Air breaks and breathing Decompress Decompress Compress Tx Pressure Begins Reached periods Begins Ends (leave unused spaces blank) Chamber Pressure (ATA 1 2 ------2 1 ) Clock Time (24 hr) 08:25 08:37 - - - - - - 10:08 10:18 Treatment Length: 113 (minutes) Treatment Segments: 4 Vital Signs Capillary Blood Glucose Reference Range: 80 - 120 mg / dl HBO Diabetic Blood Glucose Intervention Range: <131 mg/dl or >694 mg/dl Type: Time Vitals Blood Pulse: Respiratory Temperature: Capillary Blood Glucose Pulse Action Taken: Pressure: Rate: Glucose (mg/dl): Meter #: Oximetry (%) Taken: Pre 08:07 122/70 66 18 97.7 189 1 none per protocol Post 10:50 122/78 78 18 98.5 83 1 Gave Ensure per protocol; PA released to home Treatment Response Treatment Toleration: Well Treatment Completion Status: Treatment Completed without Adverse Event Electronic Signature(s) Signed: 05/30/2022 12:43:09 PM By:  Aleda Grana RCP, RRT CHT , , Signed: 05/31/2022 6:28:37 PM By: Lenda Kelp PA-C Entered By: Aleda Grana on 05/30/2022 12:42:44 , Gene Brown ([id number]) [id number] Page 2 of 2 -------------------------------------------------------------------------------- HBO Safety Checklist Details Patient Name: Date of Service: Gene Brown, Gene Brown UL V. 05/30/2022 8:00 A M Medical Record Number: [id number] Patient Account Number: [id number] Date of Birth/Sex: Treating RN: January 31, 1951 (71 y.o. Gene Brown, Gene Batten Primary Care Gene Brown: Gene Brown Other Clinician: Izetta Brown Referring Gene Brown: Treating Gene Brown: Gene Brown Weeks in Treatment: 20 HBO Safety Checklist Items Safety Checklist Consent Form Signed Patient voided / foley secured and emptied When did you last eato 07:00 am Last dose of injectable or oral agent 07:00 am Ostomy pouch emptied and vented if applicable NA All implantable devices assessed, documented and approved NA Intravenous access site secured and place NA Valuables secured Linens and cotton and cotton/polyester blend (less than 51% polyester) Personal oil-based products / skin lotions / body lotions removed Wigs or hairpieces removed NA Smoking or tobacco materials removed NA Books / newspapers / magazines / loose paper removed NA Cologne, aftershave, perfume and deodorant removed Jewelry removed (may wrap wedding band) Make-up removed NA Hair care products removed Battery operated devices (external) removed NA Heating patches and chemical warmers removed NA Titanium eyewear removed NA Nail polish cured greater than 10 hours NA Casting material cured greater than 10 hours NA Hearing aids removed NA Loose dentures or partials removed NA Prosthetics have been removed NA Patient demonstrates correct use of air break device (if applicable) Patient concerns have been  addressed Patient grounding bracelet on and cord attached to chamber Specifics for Inpatients (complete in addition to above) Medication sheet sent with patient Intravenous medications needed or due during therapy sent with patient Drainage tubes (e.g. nasogastric tube or chest tube  secured and vented) Endotracheal or Tracheotomy tube secured Cuff deflated of air and inflated with saline Airway suctioned Electronic Signature(s) Signed: 05/30/2022 12:43:09 PM By: Enedina Finner RCP, RRT CHT , , Entered By: Enedina Finner on 05/30/2022 08:36:17 ,

## 2022-06-01 NOTE — Progress Notes (Signed)
JAZIAH, GOELLER ([id number]) [id number] Page 1 of 2 Visit Report for 06/01/2022 Arrival Information Details Patient Name: Date of Service: [name], [place] UL V. 06/01/2022 8:00 A M Medical Record Number: [id number] Patient Account Number: [id number] Date of Birth/Sex: Treating RN: March 10, 1951 (71 y.o. [name], [name] Primary Care Ernestine Rohman: Tracie Harrier Other Clinician: Jacqulyn Bath Referring Kamdyn Colborn: Treating Ronnell Clinger/Extender: RO BSO N, MICHA EL G Hande, Vishwanath Weeks in Treatment: 20 Visit Information History Since Last Visit Added or deleted any medications: No Patient Arrived: Kasandra Knudsen Any new allergies or adverse reactions: No Arrival Time: 07:50 Had a fall or experienced change in No Accompanied By: self activities of daily living that may affect Transfer Assistance: None risk of falls: Patient Identification Verified: Yes Signs or symptoms of abuse/neglect since No Secondary Verification Process Completed: Yes last visito Patient Requires Transmission-Based No Hospitalized since last visit: No Precautions: Implantable device outside of the clinic No Patient Has Alerts: Yes excluding Patient Alerts: Patient on Blood Thinner cellular tissue based products placed in the 01/07/2022 [place]>220 on left center since last visit: Has Dressing in [place] as Prescribed: Yes Has Footwear/Offloading in Place as Yes Prescribed: Left: Surgical Shoe with Pressure Relief Insole Pain Present Now: No Electronic Signature(s) Signed: 06/01/2022 10:32:30 AM By: Enedina Finner RCP, RRT CHT , , Entered By: Enedina Finner on 06/01/2022 08:39:44 , -------------------------------------------------------------------------------- Encounter Discharge Information Details Patient Name: Date of Service: [name], [name] 06/01/2022 8:00 A M Medical Record Number: [id number] Patient Account Number: [id number] Date of Birth/Sex: Treating RN: [birth year] (71  y.o. [name] Primary Care Breeanna Galgano: Tracie Harrier Other Clinician: Jacqulyn Bath Referring Josealfredo Adkins: Treating Heman Que/Extender: RO BSO N, [place] EL G Hande, Vishwanath Weeks in Treatment: 20 Encounter Discharge Information Items Discharge Condition: Stable Ambulatory Status: [facility] Discharge Destination: Home HARM, JOU ([id number]) [id number] Page 2 of 2 Transportation: Private Auto Accompanied By: self Schedule Follow-up Appointment: Yes Clinical Summary of Care: Notes Patient has an HBO treatment scheduled on 06/02/22 at 08:00 am. Electronic Signature(s) Signed: 06/01/2022 10:32:30 AM By: Enedina Finner RCP, RRT CHT , , Entered By: Enedina Finner on 06/01/2022 10:32:10 , -------------------------------------------------------------------------------- Vitals Details Patient Name: Date of Service: [name], [name] 06/01/2022 8:00 A M Medical Record Number: [id number] Patient Account Number: [id number] Date of Birth/Sex: Treating RN: 08-May-1951 (71 y.o. [name] Primary Care Aspasia Rude: Tracie Harrier Other Clinician: Jacqulyn Bath Referring Alexxia Stankiewicz: Treating Joanna Borawski/Extender: RO BSO N, MICHA EL G Hande, Vishwanath Weeks in Treatment: 20 Vital Signs Time Taken: 08:11 Temperature (F): 97.7 Height (in): 68 Pulse (bpm): 66 Weight (lbs): 217 Respiratory Rate (breaths/min): 18 Body Mass Index (BMI): 33 Blood Pressure (mmHg): 124/72 Capillary Blood Glucose (mg/dl): 168 Reference Range: 80 - 120 mg / dl Electronic Signature(s) Signed: 06/01/2022 10:32:30 AM By: Enedina Finner RCP, RRT CHT , , Entered By: Enedina Finner on 06/01/2022 08:43:29 ,

## 2022-06-01 NOTE — Progress Notes (Signed)
AVRUMI, CORELL ([id number]) [id number] Page 1 of 2 Visit Report for 05/31/2022 HBO Details Patient Name: Date of Service: Gene Brown, Gene Brown UL V. 05/31/2022 8:00 A M Medical Record Number: [id number] Patient Account Number: [id number] Date of Birth/Sex: Treating RN: 03/22/1951 (71 y.o. 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 HBO Treatment Details Treatment Number: 20 Patient Type: Outpatient Chamber Type: Monoplace Chamber Serial #: [id number] Treatment Protocol: 2.0 ATA with 90 minutes oxygen, and no air breaks Treatment Details Compression Rate Down: 1.5 psi / minute De-Compression Rate Up: 1.5 psi / minute Air breaks and breathing Decompress Decompress Compress Tx Pressure Begins Reached periods Begins Ends (leave unused spaces blank) Chamber Pressure (ATA 1 2 ------2 1 ) Clock Time (24 hr) 08:19 08:31 - - - - - - 10:01 10:11 Treatment Length: 112 (minutes) Treatment Segments: 4 Vital Signs Capillary Blood Glucose Reference Range: 80 - 120 mg / dl HBO Diabetic Blood Glucose Intervention Range: <131 mg/dl or >249 mg/dl Type: Time Vitals Blood Respiratory Capillary Blood Glucose Pulse Action Pulse: Temperature: Taken: Pressure: Rate: Glucose (mg/dl): Meter #: Oximetry (%) Taken: Pre 08:08 118/72 72 18 98.6 166 1 none per protocol Post 10:31 138/82 78 18 98.2 96 1 none per protocol Treatment Response Treatment Toleration: Well Treatment Completion Status: Treatment Completed without Adverse Event Electronic Signature(s) Signed: 05/31/2022 1:22:43 PM By: Enedina Finner RCP,  RRT CHT , , Signed: 05/31/2022 6:28:37 PM By: Worthy Keeler PA-C Entered By: Enedina Finner on 05/31/2022 13:22:14 , [name] ([id number] Page 2 of 2 -------------------------------------------------------------------------------- HBO Safety Checklist Details Patient Name: Date of Service: Gene Brown, Gene Brown UL V. 05/31/2022 8:00 A M Medical Record Number: [id number] Patient Account Number: [id number] Date of Birth/Sex: Treating RN: March 10, 1951 (71 y.o. [name], [name] Primary Care Edlin Ford: Tracie Harrier Other Clinician: Jacqulyn Bath Referring Giovana Faciane: Treating Aissata Wilmore/Extender: Solon Palm Weeks in Treatment: 20 HBO Safety Checklist Items Safety Checklist Consent Form Signed Patient voided / foley secured and emptied When did you last eato 07:00 am Last dose of injectable or oral agent 07:00 am Ostomy pouch emptied and vented if applicable NA All implantable devices assessed, documented and approved NA Intravenous access site secured and place NA Valuables secured Linens and cotton and cotton/polyester blend (less than 51% polyester) Personal oil-based products / skin lotions / body lotions removed Wigs or hairpieces removed NA Smoking or tobacco materials removed NA Books / newspapers / magazines / loose paper removed NA Cologne, aftershave, perfume and deodorant removed Jewelry removed (may wrap wedding band) Make-up removed NA Hair care products removed NA Battery operated devices (external) removed NA Heating patches and chemical warmers removed NA Titanium eyewear removed NA Nail polish cured greater than 10 hours NA Casting material cured greater than 10 hours NA Hearing aids removed NA Loose dentures or partials removed NA Prosthetics have been removed NA Patient demonstrates correct use of air break device (if applicable) Patient concerns have been addressed Patient grounding  bracelet on and cord attached to chamber Specifics for Inpatients (complete in addition to above) Medication sheet sent with patient Intravenous medications needed or due during therapy sent with patient Drainage tubes (e.g. nasogastric tube or chest tube secured and vented) Endotracheal  or Tracheotomy tube secured Cuff deflated of air and inflated with saline Airway suctioned Electronic Signature(s) Signed: 05/31/2022 1:22:43 PM By: Enedina Finner RCP, RRT CHT , , Entered By: Enedina Finner on 05/31/2022 10:07:43 ,

## 2022-06-01 NOTE — Progress Notes (Signed)
ENRRIQUE, PURI ([id number]) [id number] Page 1 of 2 Visit Report for 06/01/2022 HBO Details Patient Name: Date of Service: [name], [place] UL V. 06/01/2022 8:00 A M Medical Record Number: [id number] Patient Account Number: [id number] Date of Birth/Sex: Treating RN: 08/10/1950 (71 y.o. 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 HBO Treatment Details Treatment Number: 21 Patient Type: Outpatient Chamber Type: Monoplace Chamber Serial #: [id number] Treatment Protocol: 2.0 ATA with 90 minutes oxygen, and no air breaks Treatment Details Compression Rate Down: 1.5 psi / minute De-Compression Rate Up: 1.5 psi / minute Air breaks and breathing Decompress Decompress Compress Tx Pressure Begins Reached periods Begins Ends (leave unused spaces blank) Chamber Pressure (ATA 1 2 ------2 1 ) Clock Time (24 hr) 08:23 08:35 - - - - - - 10:05 10:15 Treatment Length: 112 (minutes) Treatment Segments: 4 Vital Signs Capillary Blood Glucose Reference Range: 80 - 120 mg / dl HBO Diabetic Blood Glucose Intervention Range: <131 mg/dl or >249 mg/dl Type: Time Vitals Blood Respiratory Capillary Blood Glucose Pulse Action Pulse: Temperature: Taken: Pressure: Rate: Glucose (mg/dl): Meter #: Oximetry (%) Taken: Pre 08:11 124/72 66 18 97.7 168 1 none per protocol Post 10:20 116/78 66 18 97.6 117 1 none per protocol Treatment Response Treatment Toleration: Well Treatment Completion Status: Treatment Completed without Adverse Event Camara Rosander Notes no concern with treatment given HBO Attestation I certify  that I supervised this HBO treatment in accordance with Medicare guidelines. A trained emergency response team is readily available per Yes hospital policies and procedures. Continue HBOT as ordered. Yes Electronic Signature(s) DORY, WOOLFORK ([id number]) [id number] Page 2 of 2 Signed: 06/01/2022 4:38:50 PM By: Linton Ham MD Previous Signature: 06/01/2022 10:32:30 AM Version By: Enedina Finner RCP, RRT CHT , , Entered By: Linton Ham on 06/01/2022 16:38:20 -------------------------------------------------------------------------------- HBO Safety Checklist Details Patient Name: Date of Service: [name], [name] 06/01/2022 8:00 A M Medical Record Number: [id number] Patient Account Number: [id number] Date of Birth/Sex: Treating RN: [birth year] (71 y.o. [name] Primary Care Shalissa Easterwood: Tracie Harrier Other Clinician: Jacqulyn Bath Referring Nickey Kloepfer: Treating Roen Macgowan/Extender: RO BSO N, MICHA EL G Hande, Vishwanath Weeks in Treatment: 20 HBO Safety Checklist Items Safety Checklist Consent Form Signed Patient voided / foley secured and emptied When did you last eato 07:00 am Last dose of injectable or oral agent 07:00 am Ostomy pouch emptied and vented if applicable NA All implantable devices assessed, documented and approved NA Intravenous access site secured and place NA Valuables secured Linens and cotton and cotton/polyester blend (less than 51% polyester) Personal oil-based products / skin lotions / body lotions removed Wigs or hairpieces removed NA Smoking or tobacco materials removed NA Books / newspapers / magazines / loose paper removed NA Cologne, aftershave, perfume and deodorant removed Jewelry removed (may wrap wedding band) Make-up removed NA Hair care products removed Battery operated devices (external) removed NA Heating patches and chemical warmers removed NA Titanium eyewear removed NA Nail polish  cured greater than 10 hours NA Casting material cured greater than 10 hours NA Hearing aids removed NA Loose dentures or partials removed NA Prosthetics have been removed NA Patient demonstrates correct use of air break device (if applicable) Patient concerns  have been addressed Patient grounding bracelet on and cord attached to chamber Specifics for Inpatients (complete in addition to above) Medication sheet sent with patient Intravenous medications needed or due during therapy sent with patient Drainage tubes (e.g. nasogastric tube or chest tube secured and vented) Endotracheal or Tracheotomy tube secured Cuff deflated of air and inflated with saline Airway suctioned Electronic Signature(s) Signed: 06/01/2022 10:32:30 AM By: Enedina Finner RCP, RRT CHT , , Entered By: Enedina Finner on 06/01/2022 08:46:30 ,

## 2022-06-02 ENCOUNTER — Encounter: Payer: Medicare Other | Admitting: Physician Assistant

## 2022-06-02 DIAGNOSIS — E1169 Type 2 diabetes mellitus with other specified complication: Secondary | ICD-10-CM | POA: Diagnosis not present

## 2022-06-02 LAB — GLUCOSE, CAPILLARY
Glucose-Capillary: 147 mg/dL — ABNORMAL HIGH (ref 70–99)
Glucose-Capillary: 135 mg/dL — ABNORMAL HIGH (ref 70–99)

## 2022-06-02 NOTE — Progress Notes (Signed)
QUANTAY, ZAREMBA ([id number]) [id number] Page 1 of 2 Visit Report for 06/02/2022 HBO Details Patient Name: Date of Service: [name], [place] UL V. 06/02/2022 8:00 A M Medical Record Number: [id number] Patient Account Number: [id number] Date of Birth/Sex: Treating RN: July 05, 1951 (71 y.o. 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 HBO Treatment Details Treatment Number: 22 Patient Type: Outpatient Chamber Type: Monoplace Chamber Serial #: [id number] Treatment Protocol: 2.0 ATA with 90 minutes oxygen, and no air breaks Treatment Details Compression Rate Down: 1.5 psi / minute De-Compression Rate Up: 1.5 psi / minute Air breaks and breathing Decompress Decompress Compress Tx Pressure Begins Reached periods Begins Ends (leave unused spaces blank) Chamber Pressure (ATA 1 2 ------2 1 ) Clock Time (24 hr) 08:20 08:32 - - - - - - 10:02 10:12 Treatment Length: 112 (minutes) Treatment Segments: 4 Vital Signs Capillary Blood Glucose Reference Range: 80 - 120 mg / dl HBO Diabetic Blood Glucose Intervention Range: <131 mg/dl or >249 mg/dl Type: Time Vitals Blood Respiratory Capillary Blood Glucose Pulse Action Pulse: Temperature: Taken: Pressure: Rate: Glucose (mg/dl): Meter #: Oximetry (%) Taken: Pre 08:12 132/88 72 18 97.7 147 1 none per protocol Post 10:18 122/82 78 18 97.7 135 1 none per protocol Treatment Response Treatment Toleration: Well Treatment Completion Status: Treatment Completed without Adverse Event Electronic Signature(s) Signed: 06/02/2022 10:35:42 AM By: Enedina Finner  RCP, RRT CHT , , Signed: 06/02/2022 5:00:04 PM By: Worthy Keeler PA-C Entered By: Enedina Finner on 06/02/2022 10:34:21 , [name] ([id number]) [id number] Page 2 of 2 -------------------------------------------------------------------------------- HBO Safety Checklist Details Patient Name: Date of Service: [name], [place] UL V. 06/02/2022 8:00 A M Medical Record Number: [id number] Patient Account Number: [id number] Date of Birth/Sex: Treating RN: [birth year] (71 y.o. [name], [name] Primary Care Armani Gawlik: Tracie Harrier Other Clinician: Jacqulyn Bath Referring Audrea Bolte: Treating Calynn Ferrero/Extender: Solon Palm Weeks in Treatment: 20 HBO Safety Checklist Items Safety Checklist Consent Form Signed Patient voided / foley secured and emptied When did you last eato 07:00 am Last dose of injectable or oral agent 07:00 am Ostomy pouch emptied and vented if applicable NA All implantable devices assessed, documented and approved NA Intravenous access site secured and place NA Valuables secured Linens and cotton and cotton/polyester blend (less than 51% polyester) Personal oil-based products / skin lotions / body lotions removed Wigs or hairpieces removed NA Smoking or tobacco materials removed NA Books / newspapers / magazines / loose paper removed NA Cologne, aftershave, perfume and deodorant removed Jewelry removed (may wrap wedding band) Make-up removed NA Hair care products removed Battery operated devices (external) removed NA Heating patches and chemical warmers removed NA Titanium eyewear removed NA Nail polish cured greater than 10 hours NA Casting material cured greater than 10 hours NA Hearing aids removed NA Loose dentures or partials removed NA Prosthetics have been removed NA Patient demonstrates correct use of air break device (if applicable) Patient concerns have been addressed Patient  grounding bracelet on and cord attached to chamber Specifics for Inpatients (complete in addition to above) Medication sheet sent with patient Intravenous medications needed or due during therapy sent with patient Drainage tubes (e.g. nasogastric tube or chest tube secured and vented) Endotracheal or  Tracheotomy tube secured Cuff deflated of air and inflated with saline Airway suctioned Electronic Signature(s) Signed: 06/02/2022 10:35:42 AM By: Aleda Grana RCP, RRT CHT , , Entered By: Aleda Grana on 06/02/2022 08:38:57 ,

## 2022-06-02 NOTE — Progress Notes (Signed)
RYLER, LASKOWSKI ([id number]) [id number] Page 1 of 2 Visit Report for 06/02/2022 Arrival Information Details Patient Name: Date of Service: Gene Brown, [place] UL V. 06/02/2022 8:00 A M Medical Record Number: [id number] Patient Account Number: [id number] Date of Birth/Sex: Treating RN: June 13, 1951 (71 y.o. Gene Brown, Gene Brown Primary Care Shatika Grinnell: Tracie Harrier Other Clinician: Jacqulyn Bath Referring Dasan Hardman: Treating Eleny Cortez/Extender: Solon Palm Weeks in Treatment: 20 Visit Information History Since Last Visit Added or deleted any medications: No Patient Arrived: Gene Brown Any new allergies or adverse reactions: No Arrival Time: 07:50 Had a fall or experienced change in No Accompanied By: self activities of daily living that may affect Transfer Assistance: None risk of falls: Patient Identification Verified: Yes Signs or symptoms of abuse/neglect since No Secondary Verification Process Completed: Yes last visito Patient Requires Transmission-Based No Hospitalized since last visit: No Precautions: Implantable device outside of the clinic No Patient Has Alerts: Yes excluding Patient Alerts: Patient on Blood Thinner cellular tissue based products placed in the 01/07/2022 [place]>220 on left center since last visit: Has Dressing in [place] as Prescribed: Yes Has Footwear/Offloading in Place as Yes Prescribed: Left: Surgical Shoe with Pressure Relief Insole Pain Present Now: No Electronic Signature(s) Signed: 06/02/2022 10:35:42 AM By: Enedina Finner RCP, RRT CHT , , Entered By: Enedina Finner on 06/02/2022 08:35:47 , -------------------------------------------------------------------------------- Encounter Discharge Information Details Patient Name: Date of Service: [name], [name] 06/02/2022 8:00 A M Medical Record Number: [id number] Patient Account Number: [id number] Date of Birth/Sex: Treating RN: 09-Jan-1951 (71 y.o. Gene Brown Primary Care Faheem Ziemann: Tracie Harrier Other Clinician: Jacqulyn Bath Referring Shelina Luo: Treating Labrandon Knoch/Extender: Solon Palm Weeks in Treatment: 20 Encounter Discharge Information Items Discharge Condition: Stable Ambulatory Status: [facility] Discharge Destination: Home Gene Brown ([id number]) [id number] Page 2 of 2 Transportation: Private Auto Accompanied By: self Schedule Follow-up Appointment: Yes Clinical Summary of Care: Notes Patient has an HBO treatment scheduled on Monday 06/06/22 at 08:00 am. Electronic Signature(s) Signed: 06/02/2022 10:35:42 AM By: Enedina Finner RCP, RRT CHT , , Entered By: Enedina Finner on 06/02/2022 10:35:21 , -------------------------------------------------------------------------------- Vitals Details Patient Name: Date of Service: [name], [name] 06/02/2022 8:00 A M Medical Record Number: [id number] Patient Account Number: [id number] Date of Birth/Sex: Treating RN: 04/15/1951 (71 y.o. Gene Brown Primary Care Elaya Droege: Tracie Harrier Other Clinician: Jacqulyn Bath Referring Soren Pigman: Treating Darl Brisbin/Extender: Solon Palm Weeks in Treatment: 20 Vital Signs Time Taken: 08:12 Temperature (F): 97.7 Height (in): 68 Pulse (bpm): 72 Weight (lbs): 217 Respiratory Rate (breaths/min): 18 Body Mass Index (BMI): 33 Blood Pressure (mmHg): 132/88 Capillary Blood Glucose (mg/dl): 147 Reference Range: 80 - 120 mg / dl Electronic Signature(s) Signed: 06/02/2022 10:35:42 AM By: Enedina Finner RCP, RRT CHT , , Entered By: Enedina Finner on 06/02/2022 08:37:38 ,

## 2022-06-03 ENCOUNTER — Encounter: Payer: Medicare Other | Admitting: Physician Assistant

## 2022-06-06 ENCOUNTER — Encounter: Payer: Medicare Other | Admitting: Physician Assistant

## 2022-06-06 DIAGNOSIS — E1169 Type 2 diabetes mellitus with other specified complication: Secondary | ICD-10-CM | POA: Diagnosis not present

## 2022-06-06 LAB — GLUCOSE, CAPILLARY
Glucose-Capillary: 180 mg/dL — ABNORMAL HIGH (ref 70–99)
Glucose-Capillary: 152 mg/dL — ABNORMAL HIGH (ref 70–99)

## 2022-06-06 NOTE — Progress Notes (Signed)
DORRELL, MITCHELTREE ([id number]) [id number] Page 1 of 2 Visit Report for 06/06/2022 HBO Details Patient Name: Date of Service: Gene Brown, [place] UL V. 06/06/2022 8:00 A M Medical Record Number: [id number] Patient Account Number: [id number] Date of Birth/Sex: Treating RN: [birth year] (71 y.o. 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 HBO Treatment Details Treatment Number: 23 Patient Type: Outpatient Chamber Type: Monoplace Chamber Serial #: [id number] Treatment Protocol: 2.0 ATA with 90 minutes oxygen, and no air breaks Treatment Details Compression Rate Down: 1.5 psi / minute De-Compression Rate Up: 1.5 psi / minute Air breaks and breathing Decompress Decompress Compress Tx Pressure Begins Reached periods Begins Ends (leave unused spaces blank) Chamber Pressure (ATA 1 2 ------2 1 ) Clock Time (24 hr) 08:19 08:31 - - - - - - 10:01 10:11 Treatment Length: 112 (minutes) Treatment Segments: 4 Vital Signs Capillary Blood Glucose Reference Range: 80 - 120 mg / dl HBO Diabetic Blood Glucose Intervention Range: <131 mg/dl or >249 mg/dl Type: Time Vitals Blood Respiratory Capillary Blood Glucose Pulse Action Pulse: Temperature: Taken: Pressure: Rate: Glucose (mg/dl): Meter #: Oximetry (%) Taken: Pre 08:09 128/84 72 18 97.8 180 1 none per protocol Post 10:33 118/88 72 18 98 152 1 none per protocol Treatment Response Treatment Toleration: Well Treatment Completion Status: Treatment Completed without Adverse Event Electronic Signature(s) Signed: 06/06/2022 11:40:17 AM By: Enedina Finner RCP,  RRT CHT , , Signed: 06/06/2022 4:57:47 PM By: Worthy Keeler PA-C Entered By: Enedina Finner on 06/06/2022 11:38:05 , [name] ([id number]) [id number] Page 2 of 2 -------------------------------------------------------------------------------- HBO Safety Checklist Details Patient Name: Date of Service: Gene Brown, [place] UL V. 06/06/2022 8:00 A M Medical Record Number: [id number] Patient Account Number: [id number] Date of Birth/Sex: Treating RN: 08-01-1951 (71 y.o. [name], [name] Primary Care Tristram Milian: Tracie Harrier Other Clinician: Jacqulyn Bath Referring Payten Hobin: Treating Porschea Borys/Extender: Solon Palm Weeks in Treatment: 21 HBO Safety Checklist Items Safety Checklist Consent Form Signed Patient voided / foley secured and emptied When did you last eato 07:00 am Last dose of injectable or oral agent 07:00 am Ostomy pouch emptied and vented if applicable NA All implantable devices assessed, documented and approved NA Intravenous access site secured and place NA Valuables secured Linens and cotton and cotton/polyester blend (less than 51% polyester) Personal oil-based products / skin lotions / body lotions removed Wigs or hairpieces removed NA Smoking or tobacco materials removed NA Books / newspapers / magazines / loose paper removed NA Cologne, aftershave, perfume and deodorant removed Jewelry removed (may wrap wedding band) Make-up removed NA Hair care products removed Battery operated devices (external) removed NA Heating patches and chemical warmers removed NA Titanium eyewear removed NA Nail polish cured greater than 10 hours NA Casting material cured greater than 10 hours NA Hearing aids removed NA Loose dentures or partials removed NA Prosthetics have been removed NA Patient demonstrates correct use of air break device (if applicable) Patient concerns have been addressed Patient grounding  bracelet on and cord attached to chamber Specifics for Inpatients (complete in addition to above) Medication sheet sent with patient Intravenous medications needed or due during therapy sent with patient Drainage tubes (e.g. nasogastric tube or chest tube secured and vented) Endotracheal or  Tracheotomy tube secured Cuff deflated of air and inflated with saline Airway suctioned Electronic Signature(s) Signed: 06/06/2022 11:40:17 AM By: Enedina Finner RCP, RRT CHT , , Entered By: Enedina Finner on 06/06/2022 09:10:39 ,

## 2022-06-06 NOTE — Progress Notes (Signed)
ANTHONNY, SCHILLER ([id number]) [id number] Page 1 of 2 Visit Report for 06/06/2022 Arrival Information Details Patient Name: Date of Service: Gene Brown, Gene Brown Brown V. 06/06/2022 8:00 A M Medical Record Number: [id number] Patient Account Number: [id number] Date of Birth/Sex: Treating RN: [birth year] (71 y.o. Gene Brown, Gene Brown Primary Care Gene Brown: Gene Brown Other Clinician: [name] Brown Gene Brown: Treating Gene Brown: Gene Brown: 21 Visit Information History Since Last Visit Added or deleted any medications: No Patient Arrived: Gene Brown Any new allergies or adverse reactions: No Arrival Time: 07:50 Had a fall or experienced change in No Accompanied By: self activities of daily living that may affect Transfer Assistance: None risk of falls: Patient Identification Verified: Yes Signs or symptoms of abuse/neglect since No Secondary Verification Process Completed: Yes last visito Patient Requires Transmission-Based No Hospitalized since last visit: No Precautions: Implantable device outside of the clinic No Patient Has Alerts: Yes excluding Patient Alerts: Patient on Blood Thinner cellular tissue based products placed in the 01/07/2022 [place]>220 on left center since last visit: Has Dressing in [place] as Prescribed: Yes Has Footwear/Offloading in Place as Yes Prescribed: Left: Surgical Shoe with Pressure Relief Insole Pain Present Now: No Electronic Signature(s) Signed: 06/06/2022 11:40:17 AM By: Enedina Finner RCP, RRT CHT , , Entered By: Enedina Finner on 06/06/2022 09:07:18 , -------------------------------------------------------------------------------- Encounter Discharge Information Details Patient Name: Date of Service: Gene Brown, Gene Brown V. 06/06/2022 8:00 A M Medical Record Number: [id number] Patient Account Number: [id number] Date of Birth/Sex: Treating RN: 12-21-1950 (71 y.o. Gene Brown Primary Care Gene Brown: Gene Brown Other Clinician: [name] Brown Matisse Salais: Treating Karolyne Timmons/Extender: Gene Brown: 106 Encounter Discharge Information Items Discharge Condition: Stable Ambulatory Status: [facility] Discharge Destination: Home HISAO, DOO ([id number]) [phone].pdf Page 2 of 2 Transportation: Private Auto Accompanied By: self Schedule Follow-up Appointment: Yes Clinical Summary of Care: Notes Patient has an HBO Brown scheduled on 06/07/22 at 8:00 am. Electronic Signature(s) Signed: 06/06/2022 11:40:17 AM By: Enedina Finner RCP, RRT CHT , , Entered By: Enedina Finner on 06/06/2022 11:39:42 , -------------------------------------------------------------------------------- Vitals Details Patient Name: Date of Service: Gene Brown, Gene Brown V. 06/06/2022 8:00 A M Medical Record Number: [id number] Patient Account Number: [id number] Date of Birth/Sex: Treating RN: [birth year] (71 y.o. Gene Brown Primary Care Antrice Pal: Gene Brown Other Clinician: [name] Brown Jai Bear: Treating Tyshauna Finkbiner/Extender: Gene Brown: 21 Vital Signs Time Taken: 08:09 Temperature (F): 97.8 Height (in): 68 Pulse (bpm): 72 Weight (lbs): 217 Respiratory Rate (breaths/min): 18 Body Mass Index (BMI): 33 Blood Pressure (mmHg): 128/84 Capillary Blood Glucose (mg/dl): 180 Reference Range: 80 - 120 mg / dl Electronic Signature(s) Signed: 06/06/2022 11:40:17 AM By: Enedina Finner RCP, RRT CHT , , Entered By: Enedina Finner on 06/06/2022 09:09:38 ,

## 2022-06-07 ENCOUNTER — Encounter: Payer: Medicare Other | Admitting: Physician Assistant

## 2022-06-07 DIAGNOSIS — E1169 Type 2 diabetes mellitus with other specified complication: Secondary | ICD-10-CM | POA: Diagnosis not present

## 2022-06-07 LAB — GLUCOSE, CAPILLARY
Glucose-Capillary: 150 mg/dL — ABNORMAL HIGH (ref 70–99)
Glucose-Capillary: 125 mg/dL — ABNORMAL HIGH (ref 70–99)

## 2022-06-07 NOTE — Progress Notes (Addendum)
KOAL, ESLINGER ([id number]) [id number] Page 1 of 7 Visit Report for 06/07/2022 Arrival Information Details Patient Name: Date of Service: Gene Brown, [name] 06/07/2022 10:45 A M Medical Record Number: [id number] Patient Account Number: [id number] Date of Birth/Sex: Treating RN: 1951-08-06 (71 y.o. Gene Brown Primary Care Gene Brown: Barbette Reichmann Other Clinician: Referring Gene Brown: Treating Gene Brown: Gene Brown: 21 Visit Information History Since Last Visit Added or deleted any medications: No Patient Arrived: Gene Brown Any new allergies or adverse reactions: No Arrival Time: 10:48 Had a fall or experienced change in No Accompanied By: self activities of daily living that may affect Transfer Assistance: None risk of falls: Patient Requires Transmission-Based Precautions: No Hospitalized since last visit: No Patient Has Alerts: Yes Pain Present Now: No Patient Alerts: Patient on Blood Thinner 01/07/2022 [place]>220 on left Electronic Signature(s) Signed: 06/07/2022 4:12:19 PM By: Gene Aver MSN RN CNS WTA Entered By: Gene Brown on 06/07/2022 10:49:05 -------------------------------------------------------------------------------- Clinic Level of Care Assessment Details Patient Name: Date of Service: Gene NS, [name] 06/07/2022 10:45 A M Medical Record Number: [id number] Patient Account Number: [id number] Date of Birth/Sex: Treating RN: 09/05/1950 (71 y.o. Gene Brown Primary Care Nashaun Hillmer: Barbette Reichmann Other Clinician: Referring Gene Brown: Treating Gene Brown: Gene Brown: 21 Clinic Level of Care Assessment Items TOOL 4 Quantity Score X- 1 0 Use when only an EandM is performed on FOLLOW-UP visit ASSESSMENTS - Nursing Assessment / Reassessment X- 1 10 Reassessment of Co-morbidities (includes updates in patient status) X- 1 5 Reassessment of  Adherence to Brown Plan ASSESSMENTS - Wound and Skin A ssessment / Reassessment X - Simple Wound Assessment / Reassessment - one wound 1 5 []  - 0 Complex Wound Assessment / Reassessment - multiple wounds Gene Brown (Gene Bang) [id number] Page 2 of 7 []  - 0 Dermatologic / Skin Assessment (not related to wound area) ASSESSMENTS - Focused Assessment []  - 0 Circumferential Edema Measurements - multi extremities []  - 0 Nutritional Assessment / Counseling / Intervention []  - 0 Lower Extremity Assessment (monofilament, tuning fork, pulses) []  - 0 Peripheral Arterial Disease Assessment (using hand held doppler) ASSESSMENTS - Ostomy and/or Continence Assessment and Care []  - 0 Incontinence Assessment and Management []  - 0 Ostomy Care Assessment and Management (repouching, etc.) PROCESS - Coordination of Care X - Simple Patient / Family Education for ongoing care 1 15 []  - 0 Complex (extensive) Patient / Family Education for ongoing care X- 1 10 Staff obtains 07-11-1976, Records, T Results / Process Orders est []  - 0 Staff telephones HHA, Nursing Homes / Clarify orders / etc []  - 0 Routine Transfer to another Facility (non-emergent condition) []  - 0 Routine Hospital Admission (non-emergent condition) []  - 0 New Admissions / / Ordering NPWT Apligraf, etc. , []  - 0 Emergency Hospital Admission (emergent condition) X- 1 10 Simple Discharge Coordination []  - 0 Complex (extensive) Discharge Coordination PROCESS - Special Needs []  - 0 Pediatric / Minor Patient Management []  - 0 Isolation Patient Management []  - 0 Hearing / Language / Visual special needs []  - 0 Assessment of Community assistance (transportation, D/C planning, etc.) []  - 0 Additional assistance / Altered mentation []  - 0 Support Surface(s) Assessment (bed, cushion, seat, etc.) INTERVENTIONS - Wound Cleansing / Measurement X - Simple Wound Cleansing -  one wound 1 5 []  - 0 Complex Wound Cleansing - multiple wounds X- 1 5 Wound Imaging (photographs - any number of wounds) []  - 0  Wound Tracing (instead of photographs) X- 1 5 Simple Wound Measurement - one wound []  - 0 Complex Wound Measurement - multiple wounds INTERVENTIONS - Wound Dressings X - Small Wound Dressing one or multiple wounds 1 10 []  - 0 Medium Wound Dressing one or multiple wounds []  - 0 Large Wound Dressing one or multiple wounds []  - 0 Application of Medications - topical []  - 0 Application of Medications - injection INTERVENTIONS - Miscellaneous []  - 0 External ear exam []  - 0 Specimen Collection (cultures, biopsies, blood, body fluids, etc.) []  - 0 Specimen(s) / Culture(s) sent or taken to Lab for analysis []  - 0 Patient Transfer (multiple staff / / Similar devices) ( ) [id number] Page 3 of 7 []  - 0 Simple Staple / Suture removal (25 or less) []  - 0 Complex Staple / Suture removal (26 or more) []  - 0 Hypo / Hyperglycemic Management (close monitor of Blood Glucose) []  - 0 Ankle / Brachial Index (ABI) - do not check if billed separately X- 1 5 Vital Signs Has the patient been seen at the hospital within the last three years: Yes Total Score: 85 Level Of Care: New/Established - Level 3 Electronic Signature(s) Signed: 06/07/2022 4:44:20 PM By: MSN RN CNS WTA Previous Signature: 06/07/2022 4:12:19 PM Version By: MSN RN CNS WTA Entered By: on 06/07/2022 16:41:05 -------------------------------------------------------------------------------- Encounter Discharge Information Details Patient Name: Date of Service: Gene Bang, [name] 06/07/2022 10:45 A M Medical Record Number: [id number] Patient Account Number: Date of Birth/Sex: Treating RN: [birth year] (71 y.o. Primary Care Roran Wegner: 06/09/2022 Other  Clinician: Referring Gene Brown: Treating Adreanne Yono/Extender: Gene Brown Weeks in Brown: 21 Encounter Discharge Information Items Discharge Condition: Stable Ambulatory Status: Cane Discharge Destination: Home Transportation: Private Auto Accompanied By: self Schedule Follow-up Appointment: Yes Clinical Summary of Care: Electronic Signature(s) Signed: 06/07/2022 4:12:19 PM By: Gene Aver MSN RN CNS WTA Entered By: 06/09/2022 on 06/07/2022 11:12:23 -------------------------------------------------------------------------------- Lower Extremity Assessment Details Patient Name: Date of Service: 06/09/2022, [name] 06/07/2022 10:45 A M Medical Record Number: [id number] Patient Account Number: 12/08/1950 Date of Birth/Sex: Treating RN: Mar 19, 1951 (71 y.o. Barbette Reichmann Primary Care Jerilynn Feldmeier: Gene Shines Other Clinician: Referring Orvan Papadakis: Treating Jazman Reuter/Extender: Jerri, Hargadon (06/09/2022) [id number] Page 4 of 7 Weeks in Brown: 21 Edema Assessment Assessed: [Left: No] [Right: No] [Left: Edema] [Right: :] Calf Left: Right: Point of Measurement: 38 cm From Medial Instep 44 cm Ankle Left: Right: Point of Measurement: 11 cm From Medial Instep 29.5 cm Vascular Assessment Pulses: Dorsalis Pedis Palpable: [Left:Yes] Electronic Signature(s) Signed: 06/07/2022 4:12:19 PM By: Gene Aver MSN RN CNS WTA Entered By: 06/09/2022 on 06/07/2022 10:55:29 -------------------------------------------------------------------------------- Multi Wound Chart Details Patient Name: Date of Service: 06/09/2022, [name] 06/07/2022 10:45 A M Medical Record Number: [id number] Patient Account Number: 12/08/1950 Date of Birth/Sex: Treating RN: Aug 28, 1950 (71 y.o. Barbette Reichmann Primary Care Frankye Schwegel: Gaylyn Cheers Other Clinician: Referring Aureliano Oshields: Treating Mysha Peeler/Extender: [id number] Weeks in Brown: 21 Vital Signs Height(in): 68 Pulse(bpm): 84 Weight(lbs): 217 Blood Pressure(mmHg): 132/78 Body Mass Index(BMI): 33 Temperature(F): 98.9 Respiratory Rate(breaths/min): 16 [Brown Notes:Wound Assessments Brown Notes] Electronic Signature(s) Signed: 06/07/2022 4:12:19 PM By: Gene Aver MSN RN CNS WTA Entered By: Gene Brown on 06/07/2022 10:55:49 Gene Brown (06/09/2022) [id number] Page 5 of 7 -------------------------------------------------------------------------------- Multi-Disciplinary Care Plan Details Patient Name: Date of Service: Gene NS, [id number]  UL V. 06/07/2022 10:45 A M Medical Record Number: [id number] Patient Account Number: [id number] Date of Birth/Sex: Treating RN: March 07, 1951 (71 y.o. Seward Meth Primary Care Shadawn Hanaway: Tracie Harrier Other Clinician: Referring Avrie Kedzierski: Treating Sandor Arboleda/Extender: Solon Palm Weeks in Brown: 21 Active Inactive HBO Nursing Diagnoses: Anxiety related to feelings of confinement associated with the hyperbaric oxygen chamber Anxiety related to knowledge deficit of hyperbaric oxygen therapy and Brown procedures Discomfort related to temperature and humidity changes inside hyperbaric chamber Potential for barotraumas to ears, sinuses, teeth, and lungs or cerebral gas embolism related to changes in atmospheric pressure inside hyperbaric oxygen chamber Potential for oxygen toxicity seizures related to delivery of 100% oxygen at an increased atmospheric pressure Potential for pulmonary oxygen toxicity related to delivery of 100% oxygen at an increased atmospheric pressure Goals: Barotrauma will be prevented during HBO2 Date Initiated: 05/10/2022 T arget Resolution Date: 05/02/2022 Goal Status: Active Patient and/or family will be able to state/discuss factors appropriate to the management of their disease process during Brown Date  Initiated: 05/10/2022 T arget Resolution Date: 05/02/2022 Goal Status: Active Patient will tolerate the hyperbaric oxygen therapy Brown Date Initiated: 05/10/2022 T arget Resolution Date: 05/02/2022 Goal Status: Active Patient will tolerate the internal climate of the chamber Date Initiated: 05/10/2022 T arget Resolution Date: 05/02/2022 Goal Status: Active Patient/caregiver will verbalize understanding of HBO goals, rationale, procedures and potential hazards Date Initiated: 05/10/2022 T arget Resolution Date: 05/02/2022 Goal Status: Active Signs and symptoms of pulmonary oxygen toxicity will be recognized and promptly addressed Date Initiated: 05/10/2022 T arget Resolution Date: 05/02/2022 Goal Status: Active Signs and symptoms of seizure will be recognized and promptly addressed ; seizing patients will suffer no harm Date Initiated: 05/10/2022 T arget Resolution Date: 05/02/2022 Goal Status: Active Interventions: Administer a five (5) minute air break for patient if signs and symptoms of seizure appear and notify the hyperbaric physician Administer decongestants, per physician orders, prior to HBO2 Administer the correct therapeutic gas delivery based on the patients needs and limitations, per physician order Assess and provide for patients comfort related to the hyperbaric environment and equalization of middle ear Assess for signs and symptoms related to adverse events, including but not limited to confinement anxiety, pneumothorax, oxygen toxicity and baurotrauma Assess patient for any history of confinement anxiety Assess patient's knowledge and expectations regarding hyperbaric medicine and provide education related to the hyperbaric environment, goals of Brown and prevention of adverse events Implement protocols to decrease risk of pneumothorax in high risk patients Notes: Peripheral Neuropathy Nursing Diagnoses: AZAI, GAFFIN ([id number])  [id number] Page 6 of 7 Knowledge deficit related to disease process and management of peripheral neurovascular dysfunction Potential alteration in peripheral tissue perfusion (select prior to confirmation of diagnosis) Goals: Patient/caregiver will verbalize understanding of disease process and disease management Date Initiated: 01/07/2022 [place] Resolution Date: 01/07/2022 Goal Status: Active Interventions: Assess signs and symptoms of neuropathy upon admission and as needed Provide education on Management of Neuropathy and Related Ulcers Provide education on Management of Neuropathy upon discharge from the [facility] Notes: Electronic Signature(s) Signed: 06/07/2022 4:12:19 PM By: Rosalio Loud MSN RN CNS WTA Entered By: Rosalio Loud on 06/07/2022 10:55:38 -------------------------------------------------------------------------------- Pain Assessment Details Patient Name: Date of Service: [name], [name] 06/07/2022 10:45 A M Medical Record Number: [id number] Patient Account Number: [id number] Date of Birth/Sex: Treating RN: July 02, 1951 (71 y.o. Seward Meth Primary Care Borna Wessinger: Tracie Harrier Other Clinician: Referring Ahria Slappey: Treating Lilias Lorensen/Extender: Solon Palm Weeks in Brown: 21 Active Problems  Location of Pain Severity and Description of Pain Patient Has Paino No Site Locations Pain Management and Medication Current Pain Management: Electronic Signature(s) Signed: 06/07/2022 4:12:19 PM By: Gene Aver MSN RN CNS WTA Entered By: Gene Brown on 06/07/2022 10:51:11 Gene Bang ([id number]) [id number] Page 7 of 7 -------------------------------------------------------------------------------- Patient/Caregiver Education Details Patient Name: Date of Service: Gene Brown, [place] V. 10/31/2023andnbsp10:45 A M Medical Record Number: [id number] Patient Account Number: [id number] Date of  Birth/Gender: Treating RN: August 14, 1950 (71 y.o. Gene Brown Primary Care Physician: Barbette Reichmann Other Clinician: Referring Physician: Treating Physician/Extender: Gene Brown: 21 Education Assessment Education Provided To: Patient Education Topics Provided Peripheral Neuropathy: Handouts: Neuropathy Methods: Explain/Verbal Responses: State content correctly Electronic Signature(s) Signed: 06/07/2022 4:12:19 PM By: Gene Aver MSN RN CNS WTA Entered By: Gene Brown on 06/07/2022 10:58:13 -------------------------------------------------------------------------------- Vitals Details Patient Name: Date of Service: Gene Heck, [name] 06/07/2022 10:45 A M Medical Record Number: [id number] Patient Account Number: [id number] Date of Birth/Sex: Treating RN: 1950/12/15 (71 y.o. Gene Brown Primary Care Johannes Everage: Barbette Reichmann Other Clinician: Referring Jovann Luse: Treating Lamyia Cdebaca/Extender: Gene Brown: 21 Vital Signs Time Taken: 10:49 Temperature (F): 98.9 Height (in): 68 Pulse (bpm): 84 Weight (lbs): 217 Respiratory Rate (breaths/min): 16 Body Mass Index (BMI): 33 Blood Pressure (mmHg): 132/78 Reference Range: 80 - 120 mg / dl Electronic Signature(s) Signed: 06/07/2022 4:12:19 PM By: Gene Aver MSN RN CNS WTA Entered By: Gene Brown on 06/07/2022 10:51:01

## 2022-06-07 NOTE — Progress Notes (Signed)
LEVERETT, CAMPLIN ([id number]) [id number] Page 1 of 2 Visit Report for 06/07/2022 Arrival Information Details Patient Name: Date of Service: Gene Brown, [place] UL V. 06/07/2022 8:00 A M Medical Record Number: [id number] Patient Account Number: [id number] Date of Birth/Sex: Treating RN: 11/18/1950 (71 y.o. [name], [name] Primary Care Chamberlain Steinborn: Tracie Harrier Other Clinician: Jacqulyn Bath Referring Aspen Deterding: Treating Corinthian Kemler/Extender: Solon Palm Weeks in Treatment: 21 Visit Information History Since Last Visit Added or deleted any medications: No Patient Arrived: Gene Brown Any new allergies or adverse reactions: No Arrival Time: 07:50 Had a fall or experienced change in No Accompanied By: self activities of daily living that may affect Transfer Assistance: None risk of falls: Patient Identification Verified: Yes Signs or symptoms of abuse/neglect since No Secondary Verification Process Completed: Yes last visito Patient Requires Transmission-Based No Hospitalized since last visit: No Precautions: Implantable device outside of the clinic No Patient Has Alerts: Yes excluding Patient Alerts: Patient on Blood Thinner cellular tissue based products placed in the 01/07/2022 [place]>220 on left center since last visit: Has Dressing in [place] as Prescribed: Yes Has Footwear/Offloading in Place as Yes Prescribed: Left: Felt/Foam Surgical Shoe with Pressure Relief Insole Pain Present Now: No Electronic Signature(s) Signed: 06/07/2022 12:36:36 PM By: Enedina Finner RCP, RRT CHT , , Entered By: Enedina Finner on 06/07/2022 09:09:36 , -------------------------------------------------------------------------------- Encounter Discharge Information Details Patient Name: Date of Service: [name], [name] 06/07/2022 8:00 A M Medical Record Number: [id number] Patient Account Number: [id number] Date of Birth/Sex: Treating RN: 05-09-1951 (71  y.o. Gene Brown Primary Care Jenae Tomasello: Tracie Harrier Other Clinician: Jacqulyn Bath Referring Ernan Runkles: Treating Vendela Troung/Extender: Solon Palm Weeks in Treatment: 21 Encounter Discharge Information Items Discharge Condition: Stable Ambulatory Status: Gene Brown, Gene Brown ([id number]) [phone].pdf Page 2 of 2 Discharge Destination: Home Transportation: Private Auto Accompanied By: self Schedule Follow-up Appointment: Yes Clinical Summary of Care: Notes Patient has an HBO treatment scheduled on 06/08/22 at 08:00 am. Electronic Signature(s) Signed: 06/07/2022 12:36:36 PM By: Enedina Finner RCP, RRT CHT , , Entered By: Enedina Finner on 06/07/2022 12:29:29 , -------------------------------------------------------------------------------- Vitals Details Patient Name: Date of Service: [name], [name] 06/07/2022 8:00 A M Medical Record Number: [id number] Patient Account Number: [id number] Date of Birth/Sex: Treating RN: Jan 20, 1951 (71 y.o. Gene Brown Primary Care Bern Fare: Tracie Harrier Other Clinician: Jacqulyn Bath Referring Monique Gift: Treating Larua Collier/Extender: Solon Palm Weeks in Treatment: 21 Vital Signs Time Taken: 08:04 Temperature (F): 98.9 Height (in): 68 Pulse (bpm): 84 Weight (lbs): 217 Respiratory Rate (breaths/min): 16 Body Mass Index (BMI): 33 Blood Pressure (mmHg): 130/78 Capillary Blood Glucose (mg/dl): 150 Reference Range: 80 - 120 mg / dl Electronic Signature(s) Signed: 06/07/2022 12:36:36 PM By: Enedina Finner RCP, RRT CHT , , Entered By: Enedina Finner on 06/07/2022 09:11:05 ,

## 2022-06-07 NOTE — Progress Notes (Signed)
LANDO, ALCALDE ([id number]) [id number] Page 1 of 2 Visit Report for 06/07/2022 HBO Details Patient Name: Date of Service: Gene Brown, Gene Brown UL V. 06/07/2022 8:00 A M Medical Record Number: [id number] Patient Account Number: [id number] Date of Birth/Sex: Treating RN: 03/03/1951 (71 y.o. 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 HBO Treatment Details Treatment Number: 24 Patient Type: Outpatient Chamber Type: Monoplace Chamber Serial #: [id number] Treatment Protocol: 2.0 ATA with 90 minutes oxygen, and no air breaks Treatment Details Compression Rate Down: 1.5 psi / minute De-Compression Rate Up: 1.5 psi / minute Air breaks and breathing Decompress Decompress Compress Tx Pressure Begins Reached periods Begins Ends (leave unused spaces blank) Chamber Pressure (ATA 1 2 ------2 1 ) Clock Time (24 hr) 08:28 08:40 - - - - - - 10:11 10:20 Treatment Length: 112 (minutes) Treatment Segments: 4 Vital Signs Capillary Blood Glucose Reference Range: 80 - 120 mg / dl HBO Diabetic Blood Glucose Intervention Range: <131 mg/dl or >782 mg/dl Type: Time Vitals Blood Respiratory Capillary Blood Glucose Pulse Action Pulse: Temperature: Taken: Pressure: Rate: Glucose (mg/dl): Meter #: Oximetry (%) Taken: Pre 08:04 130/78 84 16 98.9 150 1 none per protocol Post 10:43 132/72 72 18 97.7 125 1 none per protocol Treatment Response Treatment Toleration: Well Treatment Completion Status: Treatment Completed without Adverse Event Electronic Signature(s) Signed: 06/07/2022 12:36:36 PM By: Aleda Grana  RCP, RRT CHT , , Signed: 06/07/2022 4:04:00 PM By: Lenda Kelp PA-C Entered By: Aleda Grana on 06/07/2022 12:26:05 , [name] ([id number]) [id number] Page 2 of 2 -------------------------------------------------------------------------------- HBO Safety Checklist Details Patient Name: Date of Service: Gene Brown, Gene Brown UL V. 06/07/2022 8:00 A M Medical Record Number: [id number] Patient Account Number: [id number] Date of Birth/Sex: Treating RN: 1951-01-21 (71 y.o. Gene Brown, Gene Batten Primary Care Rebbeca Sheperd: Gene Brown Other Clinician: Izetta Dakin Referring Chares Slaymaker: Treating Katora Fini/Extender: Gene Brown Weeks in Treatment: 21 HBO Safety Checklist Items Safety Checklist Consent Form Signed Patient voided / foley secured and emptied When did you last eato 07:00 am Last dose of injectable or oral agent 07:00 am Ostomy pouch emptied and vented if applicable NA All implantable devices assessed, documented and approved NA Intravenous access site secured and place NA Valuables secured Linens and cotton and cotton/polyester blend (less than 51% polyester) Personal oil-based products / skin lotions / body lotions removed Wigs or hairpieces removed NA Smoking or tobacco materials removed NA Books / newspapers / magazines / loose paper removed NA Cologne, aftershave, perfume and deodorant removed Jewelry removed (may wrap wedding band) Make-up removed NA Hair care products removed Battery operated devices (external) removed NA Heating patches and chemical warmers removed NA Titanium eyewear removed NA Nail polish cured greater than 10 hours NA Casting material cured greater than 10 hours NA Hearing aids removed NA Loose dentures or partials removed NA Prosthetics have been removed NA Patient demonstrates correct use of air break device (if applicable) Patient concerns have been addressed Patient  grounding bracelet on and cord attached to chamber Specifics for Inpatients (complete in addition to above) Medication sheet sent with patient Intravenous medications needed or due during therapy sent with patient Drainage tubes (e.g. nasogastric tube or chest tube secured and vented) Endotracheal or  Tracheotomy tube secured Cuff deflated of air and inflated with saline Airway suctioned Electronic Signature(s) Signed: 06/07/2022 12:36:36 PM By: Enedina Finner RCP, RRT CHT , , Entered By: Enedina Finner on 06/07/2022 09:12:26 ,

## 2022-06-07 NOTE — Progress Notes (Addendum)
WESTEN, DININO ([id number]) [id number] Page 1 of 8 Visit Report for 06/07/2022 Chief Complaint Document Details Patient Name: Date of Service: Gene Brown, Gene Brown UL Brown. 06/07/2022 10:45 A M Medical Record Number: [id number] Patient Account Number: [id number] Date of Birth/Sex: Treating RN: 1951/01/28 (71 y.o. Gene Brown Primary Care Provider: Barbette Reichmann Other Clinician: Referring Provider: Treating Provider/Extender: Jearld Shines Weeks in Treatment: 21 Information Obtained from: Patient Chief Complaint Left great toe ulcer Electronic Signature(s) Signed: 06/07/2022 10:20:27 AM By: Lenda Kelp Gene Brown-C Entered By: Lenda Kelp on 06/07/2022 10:20:27 -------------------------------------------------------------------------------- HPI Details Patient Name: Date of Service: Gene Heck, Gene Brown UL Brown. 06/07/2022 10:45 A M Medical Record Number: [id number] Patient Account Number: [id number] Date of Birth/Sex: Treating RN: 04-29-1951 (71 y.o. Gene Brown Primary Care Provider: Barbette Reichmann Other Clinician: Referring Provider: Treating Provider/Extender: Jearld Shines Weeks in Treatment: 21 History of Present Illness HPI Description: 05/24/16; this is a 71 year old type II diabetic who probably has some degree of polyneuropathy. He tells me that he has had an area on the plantar aspect of his left great toe that is been followed by podiatry for most the last year. He has a lot of callus, the podiatrist which shave this down put a dressing on and he would follow pad monthly or sometimes longer intervals. He has not noted any drainage or pain. He has not had a recent x-ray. No serious attempt at offloading this with modified footwear.. Last hemoglobin A1c that he knows about was over 10 a month ago he is working hard on this with his primary physician. More recently he has been soaking this with peroxide ABIs are  noncompressible and the left leg. He was told by his podiatrist he has a bone spur underneath the wound but did not wish any surgical procedures. He has a caregiver at home for a very disabled wife, the nature of her illness is not certain 05/31/16 patient comes in much the same as last week amounts of callus nonviable subcutaneous tissue which required debridement. His x-ray suggested cortical bone irregularity. He will need an MRI. He tells me he has old screws/hardware left knee however he thinks he has had a subsequent MRI after this 06/07/16 at this point in time patient has been attempting to offload his wound as much as he could on his own. He has made a conscious effort to avoid placing any pressure on the toe as he was moving around and walking and I do believe this has payed off and what I am seeing at this point in time. overall the wound appears to be somewhat improved in my opinion he is not having any significant discomfort at this point in time. In fact he tells me he has no pain at all. There is no interval sign of surrounding infection in the wound itself has actually decreased in size. Unfortunately the MRI was not ordered last week and therefore we do need to proceed with the MRI order today. again this is to evaluate the cortical bone irregularity noted on x-ray. 06/14/16 Patient has continued to use the offloading shoe at this point in time. He tells me this is somewhat difficult to get used to walking and but nonetheless he is very strict about wearing it. Again I believe this has paid off very well for him and his wound appears to be greatly improved. He does have some Gene Brown, Gene Brown ([id number]) [id number] Page 2 of 8 callused area surrounding the wound  proximally that has caused some fluid collection of maceration of the callused area. This likely will require debridement today. Some adherent slough noted over the wound bed. 06/21/16; patient's  wound on the plantar aspect of his left great toe is totally epithelialized. His MRI of the foot is tomorrow to follow-up on the cortical irregularity noted on plain x-ray. He does not have diabetic foot where 06-28-16-He comes in today with a continued healed left great toe. MRI performed on 06/23/2016 shows no abscess osteomyelitis or septic joint. Patient was discharged from wound care center today. Readmission: 01-07-2022 upon evaluation today patient appears to be doing somewhat poorly in regard to total ulcer on his left foot. Fortunately there does not appear to be any signs of infection which is great news. Has been seen by Dr. Luana Shu and he did request from Dr. Luana Shu to come see [place] due to the fact that in the past several years back we were able to get one of his wounds healed about 4 weeks. Again I explained that that is always variable but nonetheless I definitely think we can help. Fortunately I see no evidence of infection. He does have a foot offloading shoe which I think is good to attempt for now he was able to get this healed before with a peg assist. Patient does have a history of type 2 diabetes, diabetic polyneuropathy, and hypertension. This current wound started on 12-09-2021. 01-20-2022 upon evaluation today patient appears to be doing poorly in regard to his great toe. I am actually very concerned based on what I am seeing here at this point about how things are progressing. I do not believe there is any evidence of infection systemically that is obvious but locally I am concerned about the possibility this could even be an osteomyelitis type situation. 01-28-2022 upon evaluation today patient appears to be doing well with regard to his toe ulcer compared to what we were previous. Fortunately I do not see any signs of active infection at this time which is great news. No fevers, chills, nausea, vomiting, or diarrhea. With that being said the patient has not started his Augmentin yet  which I definitely think he needs to get on board ASAP. I do see some signs of new skin growth I think he is using the front offloading shoe which is good for the time being. 02-04-2022 upon evaluation today patient appears to be doing okay in regard to his toe ulcer this is actually measuring a little bit smaller but still has a bit of callus buildup but not as much as we have seen in the past. Fortunately I do not see any evidence of active infection locally or systemically which is great news. No fevers, chills, nausea, vomiting, or diarrhea. Marland Kitchen 02-14-2022 upon evaluation today patient's wound is actually showing signs of good improvement which is great news. Fortunately I do not see any evidence of active infection locally or systemically which is great news as well. No fevers, chills, nausea, vomiting, or diarrhea. 02-21-2022 upon evaluation today patient's toe ulcer actually is showing signs of excellent improvement. I do not see any evidence of infection locally or systemically which is great news and overall I am extremely pleased with where we stand today. I do not see any signs of active infection locally or systemically which is great news. No fevers, chills, nausea, vomiting, or diarrhea. 7/24; plantar left great toe no real improvement since last week. Using silver alginate gauze. He has a forefoot offloading boot.  He also has lymphedema and uses his compression pumps I do not think this is adding any particular difficulties. It sounds as though he is still pretty active 03-07-2022 upon evaluation today patient appears to be doing excellent in regard to the wound compared to previous pictures. He still has a quite deep wound on the toe good news is the x-ray did not show any signs of osteomyelitis which is excellent. He does need some debridement today. 03-14-2022 upon evaluation today patient appears to be doing well currently in regard to his wound. He has actually been tolerating the dressing  changes without complication. With that being said the dressing seems to either be draining a lot or getting very wet otherwise I think we need to have him change this more frequently. 03-21-2022 upon evaluation today patient appears to be doing well with regard to his toe ulcer I am actually seeing signs of improvement which is great news. This is slow but nonetheless the skin around the edges of the wound are definitely improving as we proceed here. I do not see any evidence of active infection locally or systemically which is great news. 03-29-2022 upon evaluation today patient appears to be doing fairly well except for with regard to his toe being somewhat red. I do think that we may want to place him back on the Augmentin which was previously beneficial for him. He is not in disagreement with this. Otherwise I did perform some debridement of clearway some of the necrotic debris today he seems to be tolerating this without complication. 04-04-2022 upon evaluation today patient unfortunately is continued to have significant issues here with his toe ulcer. He actually has an abscess more proximal on the dorsal aspect of the toe which is new since last week. Subsequently he is on antibiotics already I think we need to proceed with an MRI to further evaluate this. 04-12-2022 upon evaluation today patient appears to be doing better in regard to his toe ulcer. This actually is less red than what it was he is about done with the initial antibiotic he does has his MRI scheduled for Friday. The reason I am going to probably extend the Augmentin for the time being I will to make sure that we keep this under control especially if he potentially has a bone infection I do not going to be going without the antibiotic therapy. Patient is in agreement with that plan. 04-18-2022 upon evaluation today patient does have an MRI for review that was obtained on Sunday. 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 He has been on antibiotics I had him on Augmentin starting initially on January 25, 2022. Continuing to be a problem here. I do not see any signs of infection spreading up his foot but the toe itself still has purulent drainage coming from it. Coupled with this I have had him on the antibiotics, Augmentin, and he is tolerating that quite well. He is actually been on this since 01-20-2022. During that time his wound is getting somewhat better. But is still been very slow which is what prompted the MRI due to the fact that I felt like at a later point he started develop an abscess on the top of his toe that then had me more concerned. At this point I do believe that he is probably can  require hyperbaric oxygen therapy I think that this is the ideal treatment plan for limb salvage coupled with the antibiotic therapy and good wound care. 04-25-2022 upon evaluation today patient appears to be doing okay in regard to his foot ulcers. He actually has an opening on the dorsal aspect of the toe which does not directly connect to the plantar aspect. This is still open in both locations he did have some callus covering that did require sharp debridement today to clear this away. Fortunately there does not appear to be any evidence of active infection at this point. 05-02-2022 upon evaluation today patient appears to be doing well in regard to his wounds. He has been tolerating the dressing changes without complication. With that being said he does have some evidence here of continued drainage. There is evidence of infection as well although I do believe that the medicine is helping to keep this under good control. His erythema is improved although the swelling of the great toe is still present. He has started HBO as of this morning. 05-10-2022 upon evaluation patient's ulcers on his toe  actually are appearing to do much better. I do think he is benefiting from the hyperbarics as well as the antibiotics as well is good wound care. Overall I think that we have a good plan going and the patient is benefiting from this greatly which is also news. I do not see any signs of clinical active infection although obviously were still in the process of treating this osteomyelitis 05-17-2022 upon evaluation today patient's wounds are actually showing signs of good granulation and epithelization there is actually some callus covering To check to see whether this is still open or not. Nonetheless I do believe both wounds are significantly improved compared to previous and there is no erythema or redness-like his toe is doing better and I think he is doing well with the hyperbarics as well as with the oral antibiotics and wound care currently. 05-24-2022 upon evaluation today patient's toe was actually doing excellent. I am very pleased with where things stand and he is making great progress as far as I am concerned. I do not see any evidence of active infection locally or systemically which is great news. No fevers, chills, nausea, vomiting, or diarrhea. Gene BangGRIBBONS, Gene Brown ([id number]) [id number] Page 3 of 8 05-31-2022 upon evaluation today patient appears to be doing excellent in regard to his great toe in fact he actually appears to be completely healed based on what I am seeing. Between the hyperbarics and antibiotics he is really doing quite well along with good wound care and I am very pleased with what we are seeing at this point. He is extremely happy as well. I do think he needs to continue both antibiotics and the hyperbarics to completion to ensure the osteomyelitis completely resolves. 06-07-2022 upon evaluation today patient appears to be doing well currently in regard to his toe ulcers which again do appear to be completely closed. Again were continue with the  hyperbarics to ensure that everything completely resolves and does not return and I will make sure the osteomyelitis is completely treated. He is in agreement with this plan and subsequently were continuing to the full completion of 40 treatments. Nonetheless at this point this is his 30-day evaluation and he seems to be doing quite well everything still is remaining closed. He is now on a flat postop shoe as opposed to the front off loader. We are slowly progressing to get him into  his regular shoe. Electronic Signature(s) Signed: 06/07/2022 11:38:32 AM By: Lenda Kelp Gene Brown-C Entered By: Lenda Kelp on 06/07/2022 11:38:32 -------------------------------------------------------------------------------- Physical Exam Details Patient Name: Date of Service: Gene Heck, Gene Brown UL Brown. 06/07/2022 10:45 A M Medical Record Number: [id number] Patient Account Number: [id number] Date of Birth/Sex: Treating RN: [birth year] (71 y.o. Gene Brown Primary Care Provider: Barbette Reichmann Other Clinician: Referring Provider: Treating Provider/Extender: Jearld Shines Weeks in Treatment: 21 Constitutional Well-nourished and well-hydrated in no acute distress. Respiratory normal breathing without difficulty. Psychiatric this patient is able to make decisions and demonstrates good insight into disease process. Alert and Oriented x 3. pleasant and cooperative. Notes Upon inspection patient's wound bed actually showed signs of good granulation and epithelization at this point. Fortunately I do not see any evidence of active infection locally or systemically which is great news and overall I am extremely pleased with where we stand today. No fevers, chills, nausea, vomiting, or diarrhea. Electronic Signature(s) Signed: 06/07/2022 11:38:49 AM By: Lenda Kelp Gene Brown-C Entered By: Lenda Kelp on 06/07/2022  11:38:49 -------------------------------------------------------------------------------- Physician Orders Details Patient Name: Date of Service: Gene Heck, Gene Brown UL Brown. 06/07/2022 10:45 A M Medical Record Number: [id number] Patient Account Number: [id number] Date of Birth/Sex: Treating RN: Aug 27, 1950 (71 y.o. Gene Brown Primary Care Provider: Barbette Reichmann Other Clinician: SHEM, Gene Brown ([id number]) [id number] Page 4 of 8 Referring Provider: Treating Provider/Extender: Jearld Shines Weeks in Treatment: 21 Verbal / Phone Orders: No Diagnosis Coding ICD-10 Coding Code Description [phone] Other chronic osteomyelitis, left ankle and foot E11.621 Type 2 diabetes mellitus with foot ulcer 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osed E11.42 Type 2 diabetes mellitus with diabetic polyneuropathy I10 Essential (primary) hypertension Electronic Signature(s) Signed: 06/07/2022 4:04:00 PM By: Lenda Kelp Gene Brown-C Signed: 06/07/2022 4:12:19 PM By: Midge Aver MSN RN CNS WTA Entered By: Midge Aver on 06/07/2022 10:56:14 -------------------------------------------------------------------------------- Problem List Details Patient Name: Date of Service: Gene Heck, Gene Brown UL Brown. 06/07/2022 10:45 A M Medical Record Number: [id number] Patient Account Number: [id number] Date of Birth/Sex: Treating RN: [birth year] (71 y.o. Gene Brown Primary Care Provider: Barbette Reichmann Other Clinician: Referring Provider: Treating Provider/Extender: Jearld Shines Weeks in Treatment: 21 Active Problems ICD-10 Encounter Code Description Active Date MDM Diagnosis [phone] Other chronic osteomyelitis, left ankle and foot 04/25/2022 No Yes E11.621 Type 2 diabetes mellitus with foot ulcer 01/07/2022 No Yes 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osed 01/07/2022 No Yes E11.42  Type 2 diabetes mellitus with diabetic polyneuropathy 01/07/2022 No Yes I10 Essential (primary) hypertension 01/07/2022 No Yes Inactive Problems Resolved Problems Gene Brown, Gene Brown ([id number]) [id number] Page 5 of 8 Electronic Signature(s) Signed: 06/07/2022 4:04:00 PM By: Lenda Kelp Gene Brown-C Signed: 06/07/2022 4:12:19 PM By: Midge Aver MSN RN CNS WTA Previous Signature: 06/07/2022 10:20:22 AM Version By: Lenda Kelp Gene Brown-C Entered By: Midge Aver on 06/07/2022 10:58:20 -------------------------------------------------------------------------------- Progress Note Details Patient Name: Date of Service: Gene Heck, Gene Brown UL Brown. 06/07/2022 10:45 A M Medical Record Number: [id number] Patient Account Number: [id number] Date of Birth/Sex: Treating RN: [birth year] (71 y.o. Gene Brown Primary Care Provider: Barbette Reichmann Other Clinician: Referring Provider: Treating Provider/Extender: Jearld Shines Weeks in Treatment: 21 Subjective Chief Complaint Information obtained from Patient Left great toe ulcer History of Present Illness (HPI) 05/24/16; this is a 71 year old type II diabetic who probably has some degree of polyneuropathy. He tells me  that he has had an area on the plantar aspect of his left great toe that is been followed by podiatry for most the last year. He has a lot of callus, the podiatrist which shave this down put a dressing on and he would follow pad monthly or sometimes longer intervals. He has not noted any drainage or pain. He has not had a recent x-ray. No serious attempt at offloading this with modified footwear.. Last hemoglobin A1c that he knows about was over 10 a month ago he is working hard on this with his primary physician. More recently he has been soaking this with peroxide ABIs are noncompressible and the left leg. He was told by his podiatrist he has a bone spur underneath the wound but did not wish any surgical  procedures. He has a caregiver at home for a very disabled wife, the nature of her illness is not certain 05/31/16 patient comes in much the same as last week amounts of callus nonviable subcutaneous tissue which required debridement. His x-ray suggested cortical bone irregularity. He will need an MRI. He tells me he has old screws/hardware left knee however he thinks he has had a subsequent MRI after this 06/07/16 at this point in time patient has been attempting to offload his wound as much as he could on his own. He has made a conscious effort to avoid placing any pressure on the toe as he was moving around and walking and I do believe this has payed off and what I am seeing at this point in time. overall the wound appears to be somewhat improved in my opinion he is not having any significant discomfort at this point in time. In fact he tells me he has no pain at all. There is no interval sign of surrounding infection in the wound itself has actually decreased in size. Unfortunately the MRI was not ordered last week and therefore we do need to proceed with the MRI order today. again this is to evaluate the cortical bone irregularity noted on x-ray. 06/14/16 Patient has continued to use the offloading shoe at this point in time. He tells me this is somewhat difficult to get used to walking and but nonetheless he is very strict about wearing it. Again I believe this has paid off very well for him and his wound appears to be greatly improved. He does have some callused area surrounding the wound proximally that has caused some fluid collection of maceration of the callused area. This likely will require debridement today. Some adherent slough noted over the wound bed. 06/21/16; patient's wound on the plantar aspect of his left great toe is totally epithelialized. His MRI of the foot is tomorrow to follow-up on the cortical irregularity noted on plain x-ray. He does not have diabetic foot  where 06-28-16-He comes in today with a continued healed left great toe. MRI performed on 06/23/2016 shows no abscess osteomyelitis or septic joint. Patient was discharged from wound care center today. Readmission: 01-07-2022 upon evaluation today patient appears to be doing somewhat poorly in regard to total ulcer on his left foot. Fortunately there does not appear to be any signs of infection which is great news. Has been seen by Dr. Excell Seltzer and he did request from Dr. Excell Seltzer to come see [place] due to the fact that in the past several years back we were able to get one of his wounds healed about 4 weeks. Again I explained that that is always variable but nonetheless I definitely  think we can help. Fortunately I see no evidence of infection. He does have a foot offloading shoe which I think is good to attempt for now he was able to get this healed before with a peg assist. Patient does have a history of type 2 diabetes, diabetic polyneuropathy, and hypertension. This current wound started on 12-09-2021. 01-20-2022 upon evaluation today patient appears to be doing poorly in regard to his great toe. I am actually very concerned based on what I am seeing here at this point about how things are progressing. I do not believe there is any evidence of infection systemically that is obvious but locally I am concerned about the possibility this could even be an osteomyelitis type situation. 01-28-2022 upon evaluation today patient appears to be doing well with regard to his toe ulcer compared to what we were previous. Fortunately I do not see any signs of active infection at this time which is great news. No fevers, chills, nausea, vomiting, or diarrhea. With that being said the patient has not started his Augmentin yet which I definitely think he needs to get on board ASAP. I do see some signs of new skin growth I think he is using the front offloading shoe which is good for the time being. 02-04-2022 upon evaluation  today patient appears to be doing okay in regard to his toe ulcer this is actually measuring a little bit smaller but still has a bit of callus buildup but not as much as we have seen in the past. Fortunately I do not see any evidence of active infection locally or systemically which is great news. No fevers, chills, nausea, vomiting, or diarrhea. Marland Kitchen 02-14-2022 upon evaluation today patient's wound is actually showing signs of good improvement which is great news. Fortunately I do not see any evidence of Gene Brown, Gene Brown ([id number]) [id number] Page 6 of 8 active infection locally or systemically which is great news as well. No fevers, chills, nausea, vomiting, or diarrhea. 02-21-2022 upon evaluation today patient's toe ulcer actually is showing signs of excellent improvement. I do not see any evidence of infection locally or systemically which is great news and overall I am extremely pleased with where we stand today. I do not see any signs of active infection locally or systemically which is great news. No fevers, chills, nausea, vomiting, or diarrhea. 7/24; plantar left great toe no real improvement since last week. Using silver alginate gauze. He has a forefoot offloading boot. He also has lymphedema and uses his compression pumps I do not think this is adding any particular difficulties. It sounds as though he is still pretty active 03-07-2022 upon evaluation today patient appears to be doing excellent in regard to the wound compared to previous pictures. He still has a quite deep wound on the toe good news is the x-ray did not show any signs of osteomyelitis which is excellent. He does need some debridement today. 03-14-2022 upon evaluation today patient appears to be doing well currently in regard to his wound. He has actually been tolerating the dressing changes without complication. With that being said the dressing seems to either be draining a lot or getting very wet  otherwise I think we need to have him change this more frequently. 03-21-2022 upon evaluation today patient appears to be doing well with regard to his toe ulcer I am actually seeing signs of improvement which is great news. This is slow but nonetheless the skin around the edges of the wound are definitely improving  as we proceed here. I do not see any evidence of active infection locally or systemically which is great news. 03-29-2022 upon evaluation today patient appears to be doing fairly well except for with regard to his toe being somewhat red. I do think that we may want to place him back on the Augmentin which was previously beneficial for him. He is not in disagreement with this. Otherwise I did perform some debridement of clearway some of the necrotic debris today he seems to be tolerating this without complication. 04-04-2022 upon evaluation today patient unfortunately is continued to have significant issues here with his toe ulcer. He actually has an abscess more proximal on the dorsal aspect of the toe which is new since last week. Subsequently he is on antibiotics already I think we need to proceed with an MRI to further evaluate this. 04-12-2022 upon evaluation today patient appears to be doing better in regard to his toe ulcer. This actually is less red than what it was he is about done with the initial antibiotic he does has his MRI scheduled for Friday. The reason I am going to probably extend the Augmentin for the time being I will to make sure that we keep this under control especially if he potentially has a bone infection I do not going to be going without the antibiotic therapy. Patient is in agreement with that plan. 04-18-2022 upon evaluation today patient does have an MRI for review that was obtained on Sunday. He I am going to go through this with him today it did show that he has a septic joint and the interphalangeal joint as well as proximal and distal phalanges osteomyelitis  of the great toe. He has been on antibiotics I had him on Augmentin starting initially on January 25, 2022. Continuing to be a problem here. I do not see any signs of infection spreading up his foot but the toe itself still has purulent drainage coming from it. Coupled with this I have had him on the antibiotics, Augmentin, and he is tolerating that quite well. He is actually been on this since 01-20-2022. During that time his wound is getting somewhat better. But is still been very slow which is what prompted the MRI due to the fact that I felt like at a later point he started develop an abscess on the top of his toe that then had me more concerned. At this point I do believe that he is probably can require hyperbaric oxygen therapy I think that this is the ideal treatment plan for limb salvage coupled with the antibiotic therapy and good wound care. 04-25-2022 upon evaluation today patient appears to be doing okay in regard to his foot ulcers. He actually has an opening on the dorsal aspect of the toe which does not directly connect to the plantar aspect. This is still open in both locations he did have some callus covering that did require sharp debridement today to clear this away. Fortunately there does not appear to be any evidence of active infection at this point. 05-02-2022 upon evaluation today patient appears to be doing well in regard to his wounds. He has been tolerating the dressing changes without complication. With that being said he does have some evidence here of continued drainage. There is evidence of infection as well although I do believe that the medicine is helping to keep this under good control. His erythema is improved although the swelling of the great toe is still present. He has started HBO  as of this morning. 05-10-2022 upon evaluation patient's ulcers on his toe actually are appearing to do much better. I do think he is benefiting from the hyperbarics as well as the antibiotics  as well is good wound care. Overall I think that we have a good plan going and the patient is benefiting from this greatly which is also news. I do not see any signs of clinical active infection although obviously were still in the process of treating this osteomyelitis 05-17-2022 upon evaluation today patient's wounds are actually showing signs of good granulation and epithelization there is actually some callus covering To check to see whether this is still open or not. Nonetheless I do believe both wounds are significantly improved compared to previous and there is no erythema or redness-like his toe is doing better and I think he is doing well with the hyperbarics as well as with the oral antibiotics and wound care currently. 05-24-2022 upon evaluation today patient's toe was actually doing excellent. I am very pleased with where things stand and he is making great progress as far as I am concerned. I do not see any evidence of active infection locally or systemically which is great news. No fevers, chills, nausea, vomiting, or diarrhea. 05-31-2022 upon evaluation today patient appears to be doing excellent in regard to his great toe in fact he actually appears to be completely healed based on what I am seeing. Between the hyperbarics and antibiotics he is really doing quite well along with good wound care and I am very pleased with what we are seeing at this point. He is extremely happy as well. I do think he needs to continue both antibiotics and the hyperbarics to completion to ensure the osteomyelitis completely resolves. 06-07-2022 upon evaluation today patient appears to be doing well currently in regard to his toe ulcers which again do appear to be completely closed. Again were continue with the hyperbarics to ensure that everything completely resolves and does not return and I will make sure the osteomyelitis is completely treated. He is in agreement with this plan and subsequently were  continuing to the full completion of 40 treatments. Nonetheless at this point this is his 30-day evaluation and he seems to be doing quite well everything still is remaining closed. He is now on a flat postop shoe as opposed to the front off loader. We are slowly progressing to get him into his regular shoe. Objective Constitutional Well-nourished and well-hydrated in no acute distress. Vitals Time Taken: 10:49 AM, Height: 68 in, Weight: 217 lbs, BMI: 33, Temperature: 98.9 F, Pulse: 84 bpm, Respiratory Rate: 16 breaths/min, Blood Pressure: 132/78 mmHg. Respiratory normal breathing without difficulty. Gene Brown, Gene Brown ([id number]) [id number] Page 7 of 8 Psychiatric this patient is able to make decisions and demonstrates good insight into disease process. Alert and Oriented x 3. pleasant and cooperative. General Notes: Upon inspection patient's wound bed actually showed signs of good granulation and epithelization at this point. Fortunately I do not see any evidence of active infection locally or systemically which is great news and overall I am extremely pleased with where we stand today. No fevers, chills, nausea, vomiting, or diarrhea. 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 I am going to suggest that we have the patient continue with using the  postop shoe for now. 2. I am going to in 2 weeks have him transition into utilizing his regular shoe for a few hours a day to counter working to the to make sure that he does not show any signs of breakdown or worsening. 3. The neck step will be to get him into his shoe fully and at that point he can then be about ready for discharge she will be wrapping up with HBO  therapy pretty much right around Thanksgiving. We will see patient back for reevaluation in 1 week here in the clinic. If anything worsens or changes patient will contact our office for additional recommendations. Electronic Signature(s) Signed: 06/07/2022 11:39:55 AM By: Lenda Kelp Gene Brown-C Entered By: Lenda Kelp on 06/07/2022 11:39:54 -------------------------------------------------------------------------------- SuperBill Details Patient Name: Date of Service: Gene Heck, Gene Brown UL Brown. 06/07/2022 Medical Record Number: [id number] Patient Account Number: [id number] Date of Birth/Sex: Treating RN: 09-15-1950 (71 y.o. Gene Brown Primary Care Provider: Barbette Reichmann Other Clinician: Referring Provider: Treating Provider/Extender: Jearld Shines Weeks in Treatment: 21 Diagnosis Coding ICD-10 Codes Code Description [phone] Other chronic osteomyelitis, left ankle and foot E11.621 Type 2 diabetes mellitus with foot ulcer 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osed E11.42 Type 2 diabetes mellitus with diabetic polyneuropathy I10 Essential (primary) hypertension Gene Brown, Gene Brown ([id number]) [id number] Page 8 of 8 Facility Procedures : CPT4 Code: [id number] Description: 99213 - WOUND CARE VISIT-LEV 3 EST PT Modifier: Quantity: 1 Physician Procedures : CPT4 Code Description Modifier [id number] 99213 - WC PHYS LEVEL 3 - EST PT ICD-10 Diagnosis Description M86.672 Other chronic osteomyelitis, left ankle and foot E11.621 Type 2 diabetes mellitus with foot ulcer L97.522 Non-pressure chronic ulcer of other  part of left foot with fat layer exposed E11.42 Type 2 diabetes mellitus with diabetic polyneuropathy Quantity: 1 Electronic Signature(s) Signed: 06/07/2022 4:41:21 PM By: Midge Aver MSN RN CNS WTA Previous Signature: 06/07/2022 11:40:18 AM Version By: Lenda Kelp Gene Brown-C Entered By: Midge Aver  on 06/07/2022 16:41:20

## 2022-06-08 ENCOUNTER — Encounter: Payer: Medicare Other | Attending: Internal Medicine | Admitting: Internal Medicine

## 2022-06-08 DIAGNOSIS — M86672 Other chronic osteomyelitis, left ankle and foot: Secondary | ICD-10-CM | POA: Diagnosis not present

## 2022-06-08 DIAGNOSIS — I10 Essential (primary) hypertension: Secondary | ICD-10-CM | POA: Diagnosis not present

## 2022-06-08 DIAGNOSIS — L97522 Non-pressure chronic ulcer of other part of left foot with fat layer exposed: Secondary | ICD-10-CM | POA: Diagnosis not present

## 2022-06-08 DIAGNOSIS — I890 Lymphedema, not elsewhere classified: Secondary | ICD-10-CM | POA: Insufficient documentation

## 2022-06-08 DIAGNOSIS — E1142 Type 2 diabetes mellitus with diabetic polyneuropathy: Secondary | ICD-10-CM | POA: Diagnosis not present

## 2022-06-08 DIAGNOSIS — E11621 Type 2 diabetes mellitus with foot ulcer: Secondary | ICD-10-CM | POA: Diagnosis not present

## 2022-06-08 DIAGNOSIS — E1151 Type 2 diabetes mellitus with diabetic peripheral angiopathy without gangrene: Secondary | ICD-10-CM | POA: Insufficient documentation

## 2022-06-08 DIAGNOSIS — M109 Gout, unspecified: Secondary | ICD-10-CM | POA: Diagnosis not present

## 2022-06-08 DIAGNOSIS — M1990 Unspecified osteoarthritis, unspecified site: Secondary | ICD-10-CM | POA: Diagnosis not present

## 2022-06-08 LAB — GLUCOSE, CAPILLARY
Glucose-Capillary: 185 mg/dL — ABNORMAL HIGH (ref 70–99)
Glucose-Capillary: 104 mg/dL — ABNORMAL HIGH (ref 70–99)

## 2022-06-08 NOTE — Progress Notes (Signed)
TAMARA, KENYON ([id number]) [id number] Page 1 of 2 Visit Report for 06/08/2022 HBO Details Patient Name: Date of Service: Harvie Heck, [name] 06/08/2022 8:00 A M Medical Record Number: [id number] Patient Account Number: [id number] Date of Birth/Sex: Treating RN: [birth year] (71 y.o. [name] Primary Care Peytan Andringa: Barbette Reichmann Other Clinician: Izetta Dakin Referring Dewarren Ledbetter: Treating Esther Broyles/Extender: RO BSO N, MICHA EL G Hande, Vishwanath Weeks in Treatment: 21 HBO Treatment Course Details Treatment Course Number: 1 Ordering Korrie Hofbauer: Allen Derry T Treatments Ordered: otal 40 HBO Treatment Start Date: 05/02/2022 HBO Indication: Chronic Refractory Osteomyelitis to Left Great Toe HBO Treatment Details Treatment Number: 25 Patient Type: Outpatient Chamber Type: Monoplace Chamber Serial #: [id number] Treatment Protocol: 2.0 ATA with 90 minutes oxygen, and no air breaks Treatment Details Compression Rate Down: 1.5 psi / minute De-Compression Rate Up: 1.5 psi / minute Air breaks and breathing Decompress Decompress Compress Tx Pressure Begins Reached periods Begins Ends (leave unused spaces blank) Chamber Pressure (ATA 1 2 ------2 1 ) Clock Time (24 hr) 08:18 08:30 - - - - - - 10:00 10:10 Treatment Length: 112 (minutes) Treatment Segments: 4 Vital Signs Capillary Blood Glucose Reference Range: 80 - 120 mg / dl HBO Diabetic Blood Glucose Intervention Range: <131 mg/dl or >086 mg/dl Type: Time Vitals Blood Respiratory Capillary Blood Glucose Pulse Action Pulse: Temperature: Taken: Pressure: Rate: Glucose (mg/dl): Meter #: Oximetry (%) Taken: Pre 08:09 140/80 72 18 97.6 185 1 none per protocol Post 10:36 124/78 66 18 97.8 104 1 none per protocol Treatment Response Treatment Toleration: Well Treatment Completion Status: Treatment Completed without Adverse Event Raed Schalk Notes No concerns with treatment given HBO Attestation I certify  that I supervised this HBO treatment in accordance with Medicare guidelines. A trained emergency response team is readily available per Yes hospital policies and procedures. Continue HBOT as ordered. Yes Electronic Signature(s) CURLIE, MACKEN ([id number]) [id number] Page 2 of 2 Signed: 06/08/2022 3:59:15 PM By: Baltazar Najjar MD Previous Signature: 06/08/2022 2:02:40 PM Version By: Aleda Grana RCP, RRT CHT , , Entered By: Baltazar Najjar on 06/08/2022 15:57:18 -------------------------------------------------------------------------------- HBO Safety Checklist Details Patient Name: Date of Service: [name], [name] 06/08/2022 8:00 A M Medical Record Number: [id number] Patient Account Number: [id number] Date of Birth/Sex: Treating RN: [birth year] (71 y.o. [name] Primary Care Aleayah [place]: Barbette Reichmann Other Clinician: Izetta Dakin Referring Isiaah Cuervo: Treating Andray Assefa/Extender: RO BSO N, MICHA EL G Hande, Vishwanath Weeks in Treatment: 21 HBO Safety Checklist Items Safety Checklist Consent Form Signed Patient voided / foley secured and emptied When did you last eato 07:00 am Last dose of injectable or oral agent 07:00 am Ostomy pouch emptied and vented if applicable NA All implantable devices assessed, documented and approved NA Intravenous access site secured and place NA Valuables secured Linens and cotton and cotton/polyester blend (less than 51% polyester) Personal oil-based products / skin lotions / body lotions removed Wigs or hairpieces removed NA Smoking or tobacco materials removed NA Books / newspapers / magazines / loose paper removed NA Cologne, aftershave, perfume and deodorant removed Jewelry removed (may wrap wedding band) Make-up removed NA Hair care products removed Battery operated devices (external) removed NA Heating patches and chemical warmers removed NA Titanium eyewear removed NA Nail polish  cured greater than 10 hours NA Casting material cured greater than 10 hours NA Hearing aids removed NA Loose dentures or partials removed NA Prosthetics have been removed NA Patient demonstrates correct use of air break device (if applicable) Patient concerns  have been addressed Patient grounding bracelet on and cord attached to chamber Specifics for Inpatients (complete in addition to above) Medication sheet sent with patient Intravenous medications needed or due during therapy sent with patient Drainage tubes (e.g. nasogastric tube or chest tube secured and vented) Endotracheal or Tracheotomy tube secured Cuff deflated of air and inflated with saline Airway suctioned Electronic Signature(s) Signed: 06/08/2022 2:02:40 PM By: Enedina Finner RCP, RRT CHT , , Entered By: Enedina Finner on 06/08/2022 08:43:45 ,

## 2022-06-08 NOTE — Progress Notes (Signed)
Gene, Brown ([id number]) [id number] Page 1 of 2 Visit Report for 06/08/2022 Arrival Information Details Patient Name: Date of Service: Gene Brown, [place] UL V. 06/08/2022 8:00 A M Medical Record Number: [id number] Patient Account Number: [id number] Date of Birth/Sex: Treating RN: [birth year] (71 y.o. Gene Brown, Gene Brown Primary Care Eural Holzschuh: Tracie Harrier Other Clinician: Jacqulyn Bath Referring Alessio Bogan: Treating Kaled Allende/Extender: RO BSO N, MICHA EL G Hande, Vishwanath Weeks in Treatment: 21 Visit Information History Since Last Visit Added or deleted any medications: No Patient Arrived: Gene Brown Any new allergies or adverse reactions: No Arrival Time: 07:45 Had a fall or experienced change in No Accompanied By: self activities of daily living that may affect Transfer Assistance: None risk of falls: Patient Identification Verified: Yes Signs or symptoms of abuse/neglect since No Secondary Verification Process Completed: Yes last visito Patient Requires Transmission-Based No Implantable device outside of the clinic No Precautions: excluding Patient Has Alerts: Yes cellular tissue based products placed in the Patient Alerts: Patient on Blood Thinner center 01/07/2022 [place]>220 on left since last visit: Has Dressing in Place as Prescribed: Yes Has Footwear/Offloading in Place as Yes Prescribed: Left: Surgical Shoe with Pressure Relief Insole Pain Present Now: No Electronic Signature(s) Signed: 06/08/2022 2:02:40 PM By: Enedina Finner RCP, RRT CHT , , Entered By: Enedina Finner on 06/08/2022 08:33:20 , -------------------------------------------------------------------------------- Encounter Discharge Information Details Patient Name: Date of Service: [name], [name] 06/08/2022 8:00 A M Medical Record Number: [id number] Patient Account Number: [id number] Date of Birth/Sex: Treating RN: [birth year] (71 y.o. Gene Brown Primary Care  Tashaya Ancrum: Tracie Harrier Other Clinician: Jacqulyn Bath Referring Court Gracia: Treating Prinston Kynard/Extender: RO BSO N, [place] EL G Hande, Vishwanath Weeks in Treatment: 21 Encounter Discharge Information Items Discharge Condition: Stable Ambulatory Status: [facility] Discharge Destination: Home Transportation: [address] BRADLEE, HEITMAN ([id number]) [phone].pdf Page 2 of 2 Accompanied By: self Schedule Follow-up Appointment: Yes Clinical Summary of Care: Notes Patient has an HBO treatment scheduled on 06/09/22 at 08:00 am. Electronic Signature(s) Signed: 06/08/2022 2:02:40 PM By: Enedina Finner RCP, RRT CHT , , Entered By: Enedina Finner on 06/08/2022 14:02:07 , -------------------------------------------------------------------------------- Vitals Details Patient Name: Date of Service: [name], [name] 06/08/2022 8:00 A M Medical Record Number: [id number] Patient Account Number: [id number] Date of Birth/Sex: Treating RN: [birth year] (71 y.o. Gene Brown Primary Care Gene Brown: Tracie Harrier Other Clinician: Jacqulyn Bath Referring Johnathin Vanderschaaf: Treating Francys Bolin/Extender: RO BSO N, MICHA EL G Hande, Vishwanath Weeks in Treatment: 21 Vital Signs Time Taken: 08:09 Temperature (F): 97.6 Height (in): 68 Pulse (bpm): 72 Weight (lbs): 217 Respiratory Rate (breaths/min): 18 Body Mass Index (BMI): 33 Blood Pressure (mmHg): 140/80 Capillary Blood Glucose (mg/dl): 185 Reference Range: 80 - 120 mg / dl Electronic Signature(s) Signed: 06/08/2022 2:02:40 PM By: Enedina Finner RCP, RRT CHT , , Entered By: Enedina Finner on 06/08/2022 08:41:53 ,

## 2022-06-09 ENCOUNTER — Encounter: Payer: Medicare Other | Admitting: Physician Assistant

## 2022-06-09 DIAGNOSIS — E11621 Type 2 diabetes mellitus with foot ulcer: Secondary | ICD-10-CM | POA: Diagnosis not present

## 2022-06-09 LAB — GLUCOSE, CAPILLARY
Glucose-Capillary: 162 mg/dL — ABNORMAL HIGH (ref 70–99)
Glucose-Capillary: 136 mg/dL — ABNORMAL HIGH (ref 70–99)

## 2022-06-09 NOTE — Progress Notes (Signed)
Gene, Brown ([id number]) [id number] Page 1 of 2 Visit Report for 06/09/2022 Arrival Information Details Patient Name: Date of Service: Gene Brown, [place] UL V. 06/09/2022 8:00 A M Medical Record Number: [id number] Patient Account Number: [id number] Date of Birth/Sex: Treating RN: Dec 13, 1950 (71 y.o. Gene Brown, Gene Brown Primary Care Josselin Gaulin: Tracie Harrier Other Clinician: Jacqulyn Bath Referring Latisha Lasch: Treating Paitynn Mikus/Extender: Solon Palm Weeks in Treatment: 21 Visit Information History Since Last Visit Added or deleted any medications: No Patient Arrived: Kasandra Knudsen Any new allergies or adverse reactions: No Arrival Time: 07:50 Had a fall or experienced change in No Accompanied By: self activities of daily living that may affect Transfer Assistance: None risk of falls: Patient Identification Verified: Yes Signs or symptoms of abuse/neglect since last visito No Secondary Verification Process Completed: Yes Hospitalized since last visit: No Patient Requires Transmission-Based Precautions: No Implantable device outside of the clinic excluding No Patient Has Alerts: Yes cellular tissue based products placed in the center Patient Alerts: Patient on Blood Thinner since last visit: 01/07/2022 [place]>220 on left Pain Present Now: No Electronic Signature(s) Signed: 06/09/2022 12:21:24 PM By: Enedina Finner RCP, RRT CHT , , Entered By: Enedina Finner on 06/09/2022 09:15:33 , -------------------------------------------------------------------------------- Encounter Discharge Information Details Patient Name: Date of Service: [name], [name] 06/09/2022 8:00 A M Medical Record Number: [id number] Patient Account Number: [id number] Date of Birth/Sex: Treating RN: Nov 08, 1950 (71 y.o. Gene Brown Primary Care Magen Suriano: Tracie Harrier Other Clinician: Jacqulyn Bath Referring Kiyo Heal: Treating Britt Theard/Extender: Solon Palm Weeks in Treatment: 21 Encounter Discharge Information Items Discharge Condition: Stable Ambulatory Status: Cane Discharge Destination: Home Transportation: Private Auto Accompanied By: self Schedule Follow-up Appointment: Yes Clinical Summary of Care: Gene, Brown ([id number]) [id number] Page 2 of 2 Notes Patient has an HBO treatment scheduled on 06/10/22 at 08:00 am. Electronic Signature(s) Signed: 06/09/2022 12:21:24 PM By: Enedina Finner RCP, RRT CHT , , Entered By: Enedina Finner on 06/09/2022 12:18:07 , -------------------------------------------------------------------------------- Vitals Details Patient Name: Date of Service: [name], [name] 06/09/2022 8:00 A M Medical Record Number: [id number] Patient Account Number: [id number] Date of Birth/Sex: Treating RN: 10/10/1950 (71 y.o. Gene Brown Primary Care Gene Brown: Tracie Harrier Other Clinician: Jacqulyn Bath Referring Gene Brown: Treating Valyncia Wiens/Extender: Solon Palm Weeks in Treatment: 21 Vital Signs Time Taken: 08:10 Temperature (F): 99.4 Height (in): 68 Pulse (bpm): 66 Weight (lbs): 217 Respiratory Rate (breaths/min): 18 Body Mass Index (BMI): 33 Blood Pressure (mmHg): 122/70 Capillary Blood Glucose (mg/dl): 162 Reference Range: 80 - 120 mg / dl Electronic Signature(s) Signed: 06/09/2022 12:21:24 PM By: Enedina Finner RCP, RRT CHT , , Entered By: Enedina Finner on 06/09/2022 09:20:21 ,

## 2022-06-10 ENCOUNTER — Encounter: Payer: Medicare Other | Admitting: Physician Assistant

## 2022-06-10 DIAGNOSIS — E11621 Type 2 diabetes mellitus with foot ulcer: Secondary | ICD-10-CM | POA: Diagnosis not present

## 2022-06-10 LAB — GLUCOSE, CAPILLARY
Glucose-Capillary: 134 mg/dL — ABNORMAL HIGH (ref 70–99)
Glucose-Capillary: 167 mg/dL — ABNORMAL HIGH (ref 70–99)

## 2022-06-10 NOTE — Progress Notes (Signed)
ALECK, LOCKLIN ([id number]) [id number] Page 1 of 2 Visit Report for 06/10/2022 HBO Details Patient Name: Date of Service: Gene Brown, [place] UL V. 06/10/2022 8:00 A M Medical Record Number: [id number] Patient Account Number: [id number] Date of Birth/Sex: Treating RN: 04/08/1951 (71 y.o. 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 HBO Treatment Details Treatment Number: 27 Patient Type: Outpatient Chamber Type: Monoplace Chamber Serial #: [id number] Treatment Protocol: 2.0 ATA with 90 minutes oxygen, and no air breaks Treatment Details Compression Rate Down: 1.5 psi / minute De-Compression Rate Up: 1.5 psi / minute Air breaks and breathing Decompress Decompress Compress Tx Pressure Begins Reached periods Begins Ends (leave unused spaces blank) Chamber Pressure (ATA 1 2 ------2 1 ) Clock Time (24 hr) 08:23 08:35 - - - - - - 10:04 10:15 Treatment Length: 112 (minutes) Treatment Segments: 4 Vital Signs Capillary Blood Glucose Reference Range: 80 - 120 mg / dl HBO Diabetic Blood Glucose Intervention Range: <131 mg/dl or >249 mg/dl Type: Time Vitals Blood Respiratory Capillary Blood Glucose Pulse Action Pulse: Temperature: Taken: Pressure: Rate: Glucose (mg/dl): Meter #: Oximetry (%) Taken: Pre 07:54 122/82 72 18 98.5 134 1 none per protocol Post 10:39 140/84 78 18 97.6 167 1 none per protocol Treatment Response Treatment Toleration: Well Treatment Completion Status: Treatment Completed without Adverse Event Electronic Signature(s) Signed: 06/10/2022 11:35:51 AM By: Enedina Finner RCP,  RRT CHT , , Signed: 06/10/2022 1:54:42 PM By: Worthy Keeler PA-C Entered By: Enedina Finner on 06/10/2022 11:30:24 , [name] ([id number]) [id number] Page 2 of 2 -------------------------------------------------------------------------------- HBO Safety Checklist Details Patient Name: Date of Service: Gene Brown, [place] UL V. 06/10/2022 8:00 A M Medical Record Number: [id number] Patient Account Number: [id number] Date of Birth/Sex: Treating RN: 06-22-1951 (71 y.o. Gene Brown, Gene Brown Primary Care Shaleena Crusoe: Tracie Harrier Other Clinician: Jacqulyn Bath Referring Tinleigh Whitmire: Treating Marquice Uddin/Extender: Solon Palm Weeks in Treatment: 22 HBO Safety Checklist Items Safety Checklist Consent Form Signed Patient voided / foley secured and emptied When did you last eato 07:00 am Last dose of injectable or oral agent 06/09/22 pm Ostomy pouch emptied and vented if applicable NA All implantable devices assessed, documented and approved NA Intravenous access site secured and place NA Valuables secured Linens and cotton and cotton/polyester blend (less than 51% polyester) Personal oil-based products / skin lotions / body lotions removed Wigs or hairpieces removed NA Smoking or tobacco materials removed NA Books / newspapers / magazines / loose paper removed NA Cologne, aftershave, perfume and deodorant removed Jewelry removed (may wrap wedding band) Make-up removed NA Hair care products removed Battery operated devices (external) removed NA Heating patches and chemical warmers removed NA Titanium eyewear removed NA Nail polish cured greater than 10 hours NA Casting material cured greater than 10 hours NA Hearing aids removed NA Loose dentures or partials removed NA Prosthetics have been removed NA Patient demonstrates correct use of air break device (if applicable) Patient concerns have been addressed Patient grounding  bracelet on and cord attached to chamber Specifics for Inpatients (complete in addition to above) Medication sheet sent with patient Intravenous medications needed or due during therapy sent with patient Drainage tubes (e.g. nasogastric tube or chest tube secured and vented) Endotracheal or  Tracheotomy tube secured Cuff deflated of air and inflated with saline Airway suctioned Electronic Signature(s) Signed: 06/10/2022 11:35:51 AM By: Aleda Grana RCP, RRT CHT , , Entered By: Aleda Grana on 06/10/2022 08:48:32 ,

## 2022-06-10 NOTE — Progress Notes (Signed)
BEDFORD, WINSOR ([id number]) [id number] Page 1 of 2 Visit Report for 06/09/2022 HBO Details Patient Name: Date of Service: Gene Brown, [place] UL V. 06/09/2022 8:00 A M Medical Record Number: [id number] Patient Account Number: [id number] Date of Birth/Sex: Treating RN: 1951/04/13 (71 y.o. 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 HBO Treatment Details Treatment Number: 26 Patient Type: Outpatient Chamber Type: Monoplace Chamber Serial #: [id number] Treatment Protocol: 2.0 ATA with 90 minutes oxygen, and no air breaks Treatment Details Compression Rate Down: 1.5 psi / minute De-Compression Rate Up: 1.5 psi / minute Air breaks and breathing Decompress Decompress Compress Tx Pressure Begins Reached periods Begins Ends (leave unused spaces blank) Chamber Pressure (ATA 1 2 ------2 1 ) Clock Time (24 hr) 08:29 08:41 - - - - - - 10:11 10:21 Treatment Length: 112 (minutes) Treatment Segments: 4 Vital Signs Capillary Blood Glucose Reference Range: 80 - 120 mg / dl HBO Diabetic Blood Glucose Intervention Range: <131 mg/dl or >249 mg/dl Type: Time Vitals Blood Respiratory Capillary Blood Glucose Pulse Action Pulse: Temperature: Taken: Pressure: Rate: Glucose (mg/dl): Meter #: Oximetry (%) Taken: Pre 08:10 122/70 66 18 99.4 162 1 none per protocol Post 10:29 130/80 84 18 98.8 136 1 none per protocol Treatment Response Treatment Toleration: Well Treatment Completion Status: Treatment Completed without Adverse Event Electronic Signature(s) Signed: 06/09/2022 12:21:24 PM By: Enedina Finner RCP,  RRT CHT , , Signed: 06/09/2022 5:42:41 PM By: Worthy Keeler PA-C Entered By: Enedina Finner on 06/09/2022 12:17:03 , [name] ([id number]) [id number] Page 2 of 2 -------------------------------------------------------------------------------- HBO Safety Checklist Details Patient Name: Date of Service: Gene Brown, [place] UL V. 06/09/2022 8:00 A M Medical Record Number: [id number] Patient Account Number: [id number] Date of Birth/Sex: Treating RN: 04/16/1951 (71 y.o. [name], [name] Primary Care Arna Luis: Tracie Harrier Other Clinician: Jacqulyn Bath Referring Isabella Roemmich: Treating Nabor Thomann/Extender: Solon Palm Weeks in Treatment: 21 HBO Safety Checklist Items Safety Checklist Consent Form Signed Patient voided / foley secured and emptied When did you last eato 07:00 am Last dose of injectable or oral agent 06/08/22 pm Ostomy pouch emptied and vented if applicable NA All implantable devices assessed, documented and approved NA Intravenous access site secured and place NA Valuables secured Linens and cotton and cotton/polyester blend (less than 51% polyester) Personal oil-based products / skin lotions / body lotions removed Wigs or hairpieces removed NA Smoking or tobacco materials removed NA Books / newspapers / magazines / loose paper removed NA Cologne, aftershave, perfume and deodorant removed Jewelry removed (may wrap wedding band) Make-up removed NA Hair care products removed Battery operated devices (external) removed NA Heating patches and chemical warmers removed NA Titanium eyewear removed NA Nail polish cured greater than 10 hours NA Casting material cured greater than 10 hours NA Hearing aids removed NA Loose dentures or partials removed NA Prosthetics have been removed NA Patient demonstrates correct use of air break device (if applicable) Patient concerns have been addressed Patient grounding  bracelet on and cord attached to chamber Specifics for Inpatients (complete in addition to above) Medication sheet sent with patient Intravenous medications needed or due during therapy sent with patient Drainage tubes (e.g. nasogastric tube or chest tube secured and vented) Endotracheal or  Tracheotomy tube secured Cuff deflated of air and inflated with saline Airway suctioned Electronic Signature(s) Signed: 06/09/2022 12:21:24 PM By: Aleda Grana RCP, RRT CHT , , Entered By: Aleda Grana on 06/09/2022 09:21:52 ,

## 2022-06-10 NOTE — Progress Notes (Signed)
Gene, Brown ([id number]) [id number] Page 1 of 2 Visit Report for 06/10/2022 Arrival Information Details Patient Name: Date of Service: Gene Brown, [place] UL V. 06/10/2022 8:00 A M Medical Record Number: [id number] Patient Account Number: [id number] Date of Birth/Sex: Treating RN: 03/08/1951 (71 y.o. [name], [name] Primary Care Renea Schoonmaker: Tracie Harrier Other Clinician: Jacqulyn Bath Referring Nehemias Sauceda: Treating Mazell Aylesworth/Extender: Solon Palm Weeks in Treatment: 22 Visit Information History Since Last Visit Added or deleted any medications: No Patient Arrived: Kasandra Knudsen Any new allergies or adverse reactions: No Arrival Time: 07:35 Had a fall or experienced change in No Accompanied By: self activities of daily living that may affect Transfer Assistance: None risk of falls: Patient Identification Verified: Yes Signs or symptoms of abuse/neglect since last visito No Secondary Verification Process Completed: Yes Hospitalized since last visit: No Patient Requires Transmission-Based Precautions: No Implantable device outside of the clinic excluding No Patient Has Alerts: Yes cellular tissue based products placed in the center Patient Alerts: Patient on Blood Thinner since last visit: 01/07/2022 [place]>220 on left Pain Present Now: No Electronic Signature(s) Signed: 06/10/2022 11:35:51 AM By: Enedina Finner RCP, RRT CHT , , Entered By: Enedina Finner on 06/10/2022 08:46:23 , -------------------------------------------------------------------------------- Encounter Discharge Information Details Patient Name: Date of Service: Gene NS, [name] 06/10/2022 8:00 A M Medical Record Number: [id number] Patient Account Number: [id number] Date of Birth/Sex: Treating RN: 03-Dec-1950 (71 y.o. [name] Primary Care Hodari Chuba: Tracie Harrier Other Clinician: Jacqulyn Bath Referring Alexzandrea Normington: Treating Haset [place]/Extender: Solon Palm Weeks in Treatment: 22 Encounter Discharge Information Items Discharge Condition: Stable Ambulatory Status: Cane Discharge Destination: Home Transportation: Private Auto Accompanied By: self Schedule Follow-up Appointment: Yes Clinical Summary of Care: Gene, Brown ([id number]) [id number] Page 2 of 2 Notes Patient has an HBO treatment scheduled on 06/13/22 at 08:00 am. Electronic Signature(s) Signed: 06/10/2022 11:35:51 AM By: Enedina Finner RCP, RRT CHT , , Entered By: Enedina Finner on 06/10/2022 11:31:29 , -------------------------------------------------------------------------------- Vitals Details Patient Name: Date of Service: Gene NS, [name] 06/10/2022 8:00 A M Medical Record Number: [id number] Patient Account Number: [id number] Date of Birth/Sex: Treating RN: Jul 11, 1951 (71 y.o. [name] Primary Care Johndaniel Catlin: Tracie Harrier Other Clinician: Jacqulyn Bath Referring Eloina Ergle: Treating Genevieve Ritzel/Extender: Solon Palm Weeks in Treatment: 22 Vital Signs Time Taken: 07:54 Temperature (F): 98.5 Height (in): 68 Pulse (bpm): 72 Weight (lbs): 217 Respiratory Rate (breaths/min): 18 Body Mass Index (BMI): 33 Blood Pressure (mmHg): 122/82 Capillary Blood Glucose (mg/dl): 134 Reference Range: 80 - 120 mg / dl Electronic Signature(s) Signed: 06/10/2022 11:35:51 AM By: Enedina Finner RCP, RRT CHT , , Entered By: Enedina Finner on 06/10/2022 08:47:25 ,

## 2022-06-13 ENCOUNTER — Encounter: Payer: Medicare Other | Admitting: Internal Medicine

## 2022-06-13 DIAGNOSIS — E11621 Type 2 diabetes mellitus with foot ulcer: Secondary | ICD-10-CM | POA: Diagnosis not present

## 2022-06-13 LAB — GLUCOSE, CAPILLARY
Glucose-Capillary: 138 mg/dL — ABNORMAL HIGH (ref 70–99)
Glucose-Capillary: 170 mg/dL — ABNORMAL HIGH (ref 70–99)

## 2022-06-13 NOTE — Progress Notes (Addendum)
RANELL, SKIBINSKI ([id number]) [id number] Page 1 of 2 Visit Report for 06/13/2022 Arrival Information Details Patient Name: Date of Service: Gene Brown, Gene Brown UL V. 06/13/2022 8:00 A M Medical Record Number: [id number] Patient Account Number: [id number] Date of Birth/Sex: Treating RN: 03/04/1951 (71 y.o. Gene Brown, Gene Brown Primary Care [name]: Gene Brown Other Clinician: [name] Referring [name]: Treating Gene Brown: Gene Brown, Gene Brown, Gene Brown: 22 Visit Information History Since Last Visit Added or deleted any medications: No Patient Arrived: Gene Brown Any new allergies or adverse reactions: No Arrival Time: 07:40 Had a fall or experienced change in No Accompanied By: self activities of daily living that may affect Transfer Assistance: None risk of falls: Patient Identification Verified: Yes Signs or symptoms of abuse/neglect since last visito No Secondary Verification Process Completed: Yes Hospitalized since last visit: No Patient Requires Transmission-Based Precautions: No Implantable device outside of the clinic excluding No Patient Has Alerts: Yes cellular tissue based products placed in the center Patient Alerts: Patient on Blood Thinner since last visit: 01/07/2022 [place]>220 on left Pain Present Now: No Electronic Signature(s) Signed: 06/13/2022 1:29:43 PM By: Gene Brown RCP, RRT CHT , , Signed: 06/13/2022 2:04:26 PM By: Gene Brown RCP, RRT CHT , , Entered By: Gene Brown on 06/13/2022 08:50:23 , -------------------------------------------------------------------------------- Encounter Discharge Information Details Patient Name: Date of Service: Gene NS, Gene UL V. 06/13/2022 8:00 A M Medical Record Number: [id number] Patient Account Number: [id number] Date of Birth/Sex: Treating RN: [birth year] (71 y.o. Gene Brown Primary Care Gene Brown: Gene Brown Other  Clinician: [name] Referring Gene Brown: Treating Gene Brown/Extender: Gene Brown, [name], Gene Brown: 22 Encounter Discharge Information Items Discharge Condition: Stable Ambulatory Status: Cane Discharge Destination: Home Transportation: Private Auto Accompanied By: self Schedule Follow-up Appointment: Yes Clinical Summary of Care: [name], [name] ([id number]) [id number] Page 2 of 2 Notes Patient has an HBO Brown scheduled on 06/14/22 at 08:00 am. Electronic Signature(s) Signed: 06/13/2022 1:29:43 PM By: Gene Brown RCP, RRT CHT , , Entered By: Gene Brown on 06/13/2022 13:29:19 , -------------------------------------------------------------------------------- Vitals Details Patient Name: Date of Service: Gene NS, Gene UL V. 06/13/2022 8:00 A M Medical Record Number: [id number] Patient Account Number: [id number] Date of Birth/Sex: Treating RN: [birth year] (71 y.o. Gene Brown Primary Care [name]: Gene Brown Other Clinician: [name] Referring [name]: Treating [name]/Extender: Gene Brown, Gene Brown, Gene Brown: 22 Vital Signs Time Taken: 08:07 Temperature (F): 98.3 Height (in): 68 Pulse (bpm): 72 Weight (lbs): 217 Respiratory Rate (breaths/min): 18 Body Mass Index (BMI): 33 Blood Pressure (mmHg): 120/82 Capillary Blood Glucose (mg/dl): 138 Reference Range: 80 - 120 mg / dl Electronic Signature(s) Signed: 06/13/2022 1:29:43 PM By: Gene Brown RCP, RRT CHT , , Signed: 06/13/2022 2:04:26 PM By: Gene Brown RCP, RRT CHT , , Entered By: Gene Brown on 06/13/2022 08:51:13 ,

## 2022-06-14 ENCOUNTER — Ambulatory Visit: Payer: Medicare Other | Admitting: Internal Medicine

## 2022-06-14 ENCOUNTER — Encounter: Payer: Medicare Other | Admitting: Internal Medicine

## 2022-06-14 DIAGNOSIS — E11621 Type 2 diabetes mellitus with foot ulcer: Secondary | ICD-10-CM | POA: Diagnosis not present

## 2022-06-14 LAB — GLUCOSE, CAPILLARY
Glucose-Capillary: 129 mg/dL — ABNORMAL HIGH (ref 70–99)
Glucose-Capillary: 150 mg/dL — ABNORMAL HIGH (ref 70–99)

## 2022-06-14 NOTE — Progress Notes (Signed)
CALVIN, JABLONOWSKI ([id number]) [id number] Page 1 of 3 Visit Report for 06/13/2022 HBO Details Patient Name: Date of Service: Gene Brown, [place] UL V. 06/13/2022 8:00 A M Medical Record Number: [id number] Patient Account Number: [id number] Date of Birth/Sex: Treating RN: 10/23/1950 (71 y.o. 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 HBO Treatment Details Treatment Number: 28 Patient Type: Outpatient Chamber Type: Monoplace Chamber Serial #: [id number] Treatment Protocol: 2.0 ATA with 90 minutes oxygen, and no air breaks Treatment Details Compression Rate Down: 1.5 psi / minute De-Compression Rate Up: 1.5 psi / minute Air breaks and breathing Decompress Decompress Compress Tx Pressure Begins Reached periods Begins Ends (leave unused spaces blank) Chamber Pressure (ATA 1 2 ------2 1 ) Clock Time (24 hr) 08:18 08:30 - - - - - - 10:00 10:12 Treatment Length: 114 (minutes) Treatment Segments: 4 Vital Signs Capillary Blood Glucose Reference Range: 80 - 120 mg / dl HBO Diabetic Blood Glucose Intervention Range: <131 mg/dl or >249 mg/dl Type: Time Vitals Blood Respiratory Capillary Blood Glucose Pulse Action Pulse: Temperature: Taken: Pressure: Rate: Glucose (mg/dl): Meter #: Oximetry (%) Taken: Pre 08:07 120/82 72 18 98.3 138 1 none per protocol Post 10:42 136/78 66 18 99.6 170 1 none per protocol Treatment Response Treatment Toleration: Well Treatment Completion Status: Treatment Completed without Adverse Event [name] Notes no concerns with treatment given HBO Attestation I certify  that I supervised this HBO treatment in accordance with Medicare guidelines. A trained emergency response team is readily available per Yes hospital policies and procedures. Continue HBOT as ordered. Yes Electronic Signature(s) [name], [name] ([id number]) [id number] Page 2 of 3 Signed: 06/13/2022 5:57:59 PM By: Linton Ham MD Previous Signature: 06/13/2022 1:29:43 PM Version By: Enedina Finner RCP, RRT CHT , , Previous Signature: 06/13/2022 4:14:14 PM Version By: Linton Ham MD Previous Signature: 06/13/2022 2:04:26 PM Version By: Enedina Finner RCP, RRT CHT , , Entered By: Linton Ham on 06/13/2022 17:54:45 -------------------------------------------------------------------------------- HBO Safety Checklist Details Patient Name: Date of Service: Gene Bake, [name] 06/13/2022 8:00 A M Medical Record Number: [id number] Patient Account Number: [id number] Date of Birth/Sex: Treating RN: 12/22/1950 (71 y.o. Gene Brown Primary Care Eloise Picone: Tracie Harrier Other Clinician: Jacqulyn Bath Referring Zedric Deroy: Treating Ledger Heindl/Extender: RO BSO N, MICHA EL G Hande, Vishwanath Weeks in Treatment: 22 HBO Safety Checklist Items Safety Checklist Consent Form Signed Patient voided / foley secured and emptied When did you last eato 07:00 am Last dose of injectable or oral agent 06/12/22 pm Ostomy pouch emptied and vented if applicable NA All implantable devices assessed, documented and approved NA Intravenous access site secured and place NA Valuables secured NA Linens and cotton and cotton/polyester blend (less than 51% polyester) Personal oil-based products / skin lotions / body lotions removed Wigs or hairpieces removed NA Smoking or tobacco materials removed NA Books / newspapers / magazines / loose paper removed NA Cologne, aftershave, perfume and deodorant removed Jewelry removed (may wrap wedding band) Make-up  removed NA Hair care products removed Battery operated devices (external) removed NA Heating patches and chemical warmers removed NA Titanium eyewear removed NA Nail polish cured greater than 10 hours NA Casting material cured greater than 10 hours NA Hearing  aids removed NA Loose dentures or partials removed NA Prosthetics have been removed NA Patient demonstrates correct use of air break device (if applicable) Patient concerns have been addressed Patient grounding bracelet on and cord attached to chamber Specifics for Inpatients (complete in addition to above) Medication sheet sent with patient Intravenous medications needed or due during therapy sent with patient Drainage tubes (e.g. nasogastric tube or chest tube secured and vented) Endotracheal or Tracheotomy tube secured Cuff deflated of air and inflated with saline Airway suctioned Electronic Signature(s) Signed: 06/13/2022 1:29:43 PM By: Enedina Finner RCP, RRT CHT , , Signed: 06/13/2022 2:04:26 PM By: Enedina Finner RCP, RRT CHT , , Entered By: Enedina Finner on 06/13/2022 08:52:12 , [name] ([id number] Page 3 of 3

## 2022-06-14 NOTE — Progress Notes (Signed)
DINARI, STGERMAINE ([id number]) [id number] Page 1 of 2 Visit Report for 06/14/2022 Arrival Information Details Patient Name: Date of Service: Gene Brown, [place] UL Brown. 06/14/2022 8:00 A M Medical Record Number: [id number] Patient Account Number: [id number] Date of Birth/Sex: Treating RN: March 16, 1951 (71 y.o. [name], [name] Primary Care Tanylah Schnoebelen: Tracie Harrier Other Clinician: Jacqulyn Bath Referring Ether Wolters: Treating Jamirra Curnow/Extender: RO BSO N, MICHA EL G Hande, Vishwanath Weeks in Treatment: 22 Visit Information History Since Last Visit Added or deleted any medications: No Patient Arrived: Kasandra Knudsen Any new allergies or adverse reactions: No Arrival Time: 07:45 Had a fall or experienced change in No Accompanied By: self activities of daily living that may affect Transfer Assistance: None risk of falls: Patient Identification Verified: Yes Signs or symptoms of abuse/neglect since last visito No Secondary Verification Process Completed: Yes Hospitalized since last visit: No Patient Requires Transmission-Based Precautions: No Implantable device outside of the clinic excluding No Patient Has Alerts: Yes cellular tissue based products placed in the center Patient Alerts: Patient on Blood Thinner since last visit: 01/07/2022 [place]>220 on left Pain Present Now: No Electronic Signature(s) Signed: 06/14/2022 10:56:29 AM By: Enedina Finner RCP, RRT CHT , , Entered By: Enedina Finner on 06/14/2022 08:27:04 , -------------------------------------------------------------------------------- Encounter Discharge Information Details Patient Name: Date of Service: Gene Brown, Gene Brown. 06/14/2022 8:00 A M Medical Record Number: [id number] Patient Account Number: [id number] Date of Birth/Sex: Treating RN: [birth year] (71 y.o. [name] Primary Care Kaelon Weekes: Tracie Harrier Other Clinician: Jacqulyn Bath Referring Patrice Moates: Treating Sunil Hue/Extender: RO BSO  N, MICHA EL G Hande, Vishwanath Weeks in Treatment: 22 Encounter Discharge Information Items Discharge Condition: Stable Ambulatory Status: Cane Discharge Destination: Home Transportation: Private Auto Accompanied By: self Schedule Follow-up Appointment: Yes Clinical Summary of Care: [name], [name] ([id number]) [id number] Page 2 of 2 Notes Patient has an HBO treatment scheduled on 06/15/22 at 08:00 am. Electronic Signature(s) Signed: 06/14/2022 10:56:29 AM By: Enedina Finner RCP, RRT CHT , , Entered By: Enedina Finner on 06/14/2022 10:56:08 , -------------------------------------------------------------------------------- Vitals Details Patient Name: Date of Service: Gene Brown, Gene Brown. 06/14/2022 8:00 A M Medical Record Number: [id number] Patient Account Number: [id number] Date of Birth/Sex: Treating RN: 07-11-1951 (71 y.o. [name] Primary Care Papa [place]: Tracie Harrier Other Clinician: Jacqulyn Bath Referring Traylen Eckels: Treating Jefrey Raburn/Extender: RO BSO N, MICHA EL G Hande, Vishwanath Weeks in Treatment: 22 Vital Signs Time Taken: 08:05 Temperature (F): 99.6 Height (in): 68 Pulse (bpm): 78 Weight (lbs): 217 Respiratory Rate (breaths/min): 18 Body Mass Index (BMI): 33 Blood Pressure (mmHg): 130/82 Capillary Blood Glucose (mg/dl): 129 Reference Range: 80 - 120 mg / dl Electronic Signature(s) Signed: 06/14/2022 10:56:29 AM By: Enedina Finner RCP, RRT CHT , , Entered By: Enedina Finner on 06/14/2022 08:28:57 ,

## 2022-06-15 ENCOUNTER — Encounter (HOSPITAL_BASED_OUTPATIENT_CLINIC_OR_DEPARTMENT_OTHER): Payer: Medicare Other | Admitting: Internal Medicine

## 2022-06-15 DIAGNOSIS — M86672 Other chronic osteomyelitis, left ankle and foot: Secondary | ICD-10-CM | POA: Diagnosis not present

## 2022-06-15 DIAGNOSIS — L97522 Non-pressure chronic ulcer of other part of left foot with fat layer exposed: Secondary | ICD-10-CM

## 2022-06-15 DIAGNOSIS — E11621 Type 2 diabetes mellitus with foot ulcer: Secondary | ICD-10-CM

## 2022-06-15 LAB — GLUCOSE, CAPILLARY
Glucose-Capillary: 214 mg/dL — ABNORMAL HIGH (ref 70–99)
Glucose-Capillary: 117 mg/dL — ABNORMAL HIGH (ref 70–99)

## 2022-06-15 NOTE — Progress Notes (Addendum)
BRIGGS, EDELEN ([id number]) [id number] Page 1 of 2 Visit Report for 06/15/2022 HBO Details Patient Name: Date of Service: Gene Brown, [name] 06/15/2022 8:00 A M Medical Record Number: [id number] Patient Account Number: [id number] Date of Birth/Sex: Treating RN: Jun 12, 1951 (71 y.o. 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 HBO Treatment Details Treatment Number: 30 Patient Type: Outpatient Chamber Type: Monoplace Chamber Serial #: [id number] Treatment Protocol: 2.0 ATA with 90 minutes oxygen, and no air breaks Treatment Details Compression Rate Down: 1.5 psi / minute De-Compression Rate Up: 1.5 psi / minute Air breaks and breathing Decompress Decompress Compress Tx Pressure Begins Reached periods Begins Ends (leave unused spaces blank) Chamber Pressure (ATA 1 2 ------2 1 ) Clock Time (24 hr) 08:36 08:48 - - - - - - 10:18 10:30 Treatment Length: 114 (minutes) Treatment Segments: 4 Vital Signs Capillary Blood Glucose Reference Range: 80 - 120 mg / dl HBO Diabetic Blood Glucose Intervention Range: <131 mg/dl or >024 mg/dl Type: Time Vitals Blood Respiratory Capillary Blood Glucose Pulse Action Pulse: Temperature: Taken: Pressure: Rate: Glucose (mg/dl): Meter #: Oximetry (%) Taken: Pre 08:16 130/70 72 18 98.3 214 1 none per protocol Post 10:57 130/76 72 18 97.6 117 1 none per protocol Treatment Response Treatment Toleration: Well Treatment Completion Status: Treatment Completed without Adverse Event HBO Attestation I certify that I supervised this HBO treatment in accordance with  Medicare guidelines. A trained emergency response team is readily available per Yes hospital policies and procedures. Continue HBOT as ordered. Yes Electronic Signature(s) Signed: 06/15/2022 3:55:12 PM By: Geralyn Corwin DO Previous Signature: 06/15/2022 11:52:39 AM Version By: Aleda Grana RCP, RRT CHT , , Previous Signature: 06/15/2022 2:02:44 PM Version By: Corinna Capra ([id number]) [id number] Page 2 of 2 Entered By: Geralyn Corwin on 06/15/2022 14:03:05 -------------------------------------------------------------------------------- HBO Safety Checklist Details Patient Name: Date of Service: Gene Brown, [name] 06/15/2022 8:00 A M Medical Record Number: [id number] Patient Account Number: [id number] Date of Birth/Sex: Treating RN: 05/26/1951 (71 y.o. [name], [name] Primary Care Marabelle Cushman: Barbette Reichmann Other Clinician: Izetta Dakin Referring Linnea Todisco: Treating Kennedie Pardoe/Extender: Adolph Pollack Weeks in Treatment: 22 HBO Safety Checklist Items Safety Checklist Consent Form Signed Patient voided / foley secured and emptied When did you last eato 07:00 am Last dose of injectable or oral agent 06/14/22 pm Ostomy pouch emptied and vented if applicable NA All implantable devices assessed, documented and approved NA Intravenous access site secured and place NA Valuables secured Linens and cotton and cotton/polyester blend (less than 51% polyester) Personal oil-based products / skin lotions / body lotions removed Wigs or hairpieces removed NA Smoking or tobacco materials removed NA Books / newspapers / magazines / loose paper removed NA Cologne, aftershave, perfume and deodorant removed Jewelry removed (may wrap wedding band) Make-up removed NA Hair care products removed Battery operated devices (external) removed NA Heating patches and chemical warmers removed NA Titanium eyewear  removed NA Nail polish cured greater than 10 hours NA Casting material cured greater than 10 hours NA Hearing aids removed NA Loose dentures or partials removed NA Prosthetics have been removed NA Patient demonstrates correct use of air break device (if applicable) Patient concerns have been addressed Patient grounding  bracelet on and cord attached to chamber Specifics for Inpatients (complete in addition to above) Medication sheet sent with patient Intravenous medications needed or due during therapy sent with patient Drainage tubes (e.g. nasogastric tube or chest tube secured and vented) Endotracheal or Tracheotomy tube secured Cuff deflated of air and inflated with saline Airway suctioned Electronic Signature(s) Signed: 06/15/2022 11:52:39 AM By: Aleda Grana RCP, RRT CHT , , Entered By: Aleda Grana on 06/15/2022 09:24:47 ,

## 2022-06-15 NOTE — Progress Notes (Signed)
DEFOREST, MAIDEN ([id number]) [id number] Page 1 of 2 Visit Report for 06/15/2022 Arrival Information Details Patient Name: Date of Service: Gene Brown, Gene Brown. 06/15/2022 8:00 A M Medical Record Number: [id number] Patient Account Number: [id number] Date of Birth/Sex: Treating RN: [birth year] (71 y.o. Gene Brown, Gene Brown Primary Care Gene Brown: Gene Brown Other Clinician: Izetta Brown Referring Gene Brown: Treating Gene Brown/Extender: Gene Brown Weeks in Treatment: 22 Visit Information History Since Last Visit Added or deleted any medications: No Patient Arrived: Gene Brown Any new allergies or adverse reactions: No Arrival Time: 09:02 Had a fall or experienced change in No Accompanied By: self activities of daily living that may affect Transfer Assistance: None risk of falls: Patient Identification Verified: Yes Signs or symptoms of abuse/neglect since last visito No Secondary Verification Process Completed: Yes Hospitalized since last visit: No Patient Requires Transmission-Based Precautions: No Implantable device outside of the clinic excluding No Patient Has Alerts: Yes cellular tissue based products placed in the center Patient Alerts: Patient on Blood Thinner since last visit: 01/07/2022 [place]>220 on left Pain Present Now: No Electronic Signature(s) Signed: 06/15/2022 11:52:39 AM By: Aleda Grana RCP, RRT CHT , , Entered By: Aleda Grana on 06/15/2022 09:16:18 , -------------------------------------------------------------------------------- Encounter Discharge Information Details Patient Name: Date of Service: Gene Brown, Gene Brown. 06/15/2022 8:00 A M Medical Record Number: [id number] Patient Account Number: [id number] Date of Birth/Sex: Treating RN: 11/04/1950 (71 y.o. Gene Brown Primary Care Avyaan Summer: Gene Brown Other Clinician: Izetta Brown Referring Gene Brown: Treating Gene Brown/Extender: Gene Brown Weeks in Treatment: 22 Encounter Discharge Information Items Discharge Condition: Stable Ambulatory Status: Cane Discharge Destination: Home Transportation: Private Auto Accompanied By: self Schedule Follow-up Appointment: Yes Clinical Summary of Care: Gene Brown, Gene Brown ([id number]) [id number] Page 2 of 2 Notes Patient has an HBO treatment scheduled on 06/16/22 at 08:00 am. Electronic Signature(s) Signed: 06/15/2022 11:52:39 AM By: Aleda Grana RCP, RRT CHT , , Entered By: Aleda Grana on 06/15/2022 11:52:05 , -------------------------------------------------------------------------------- Vitals Details Patient Name: Date of Service: Gene Brown, Gene Brown. 06/15/2022 8:00 A M Medical Record Number: [id number] Patient Account Number: [id number] Date of Birth/Sex: Treating RN: 25-Feb-1951 (71 y.o. Gene Brown Primary Care Gene Brown: Gene Brown Other Clinician: Izetta Brown Referring Gene Brown: Treating Gene Brown/Extender: Gene Brown Weeks in Treatment: 22 Vital Signs Time Taken: 08:16 Temperature (F): 98.3 Height (in): 68 Pulse (bpm): 72 Weight (lbs): 217 Respiratory Rate (breaths/min): 18 Body Mass Index (BMI): 33 Blood Pressure (mmHg): 130/70 Capillary Blood Glucose (mg/dl): 244 Reference Range: 80 - 120 mg / dl Electronic Signature(s) Signed: 06/15/2022 11:52:39 AM By: Aleda Grana RCP, RRT CHT , , Entered By: Aleda Grana on 06/15/2022 09:18:19 ,

## 2022-06-15 NOTE — Progress Notes (Signed)
AAIDYN, SAN ([id number]) [id number] Page 1 of 2 Visit Report for 06/14/2022 HBO Details Patient Name: Date of Service: Harvie Heck, [name] 06/14/2022 8:00 A M Medical Record Number: [id number] Patient Account Number: [id number] Date of Birth/Sex: Treating RN: [birth year] (71 y.o. 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 HBO Treatment Details Treatment Number: 29 Patient Type: Outpatient Chamber Type: Monoplace Chamber Serial #: [id number] Treatment Protocol: 2.0 ATA with 90 minutes oxygen, and no air breaks Treatment Details Compression Rate Down: 1.5 psi / minute De-Compression Rate Up: 1.5 psi / minute Air breaks and breathing Decompress Decompress Compress Tx Pressure Begins Reached periods Begins Ends (leave unused spaces blank) Chamber Pressure (ATA 1 2 ------2 1 ) Clock Time (24 hr) 08:17 08:29 - - - - - - 09:59 10:10 Treatment Length: 113 (minutes) Treatment Segments: 4 Vital Signs Capillary Blood Glucose Reference Range: 80 - 120 mg / dl HBO Diabetic Blood Glucose Intervention Range: <131 mg/dl or >782 mg/dl Type: Time Vitals Blood Pulse: Respiratory Temperature: Capillary Blood Glucose Pulse Action Taken: Pressure: Rate: Glucose (mg/dl): Meter #: Oximetry (%) Taken: Pre 08:05 130/82 78 18 99.6 129 1 Gave Ensure; proceed with HBO per MD Post 10:37 126/88 78 18 97.8 150 1 none per protocol Treatment Response Treatment Toleration: Well Treatment Completion Status: Treatment Completed without Adverse Event Skylen Spiering Notes No concerns with treatment given HBO  Attestation I certify that I supervised this HBO treatment in accordance with Medicare guidelines. A trained emergency response team is readily available per Yes hospital policies and procedures. Continue HBOT as ordered. Yes Electronic Signature(s) JERIC, SLAGEL ([id number]) [id number] Page 2 of 2 Signed: 06/14/2022 5:01:28 PM By: Baltazar Najjar MD Previous Signature: 06/14/2022 10:56:29 AM Version By: Aleda Grana RCP, RRT CHT , , Entered By: Baltazar Najjar on 06/14/2022 16:51:43 -------------------------------------------------------------------------------- HBO Safety Checklist Details Patient Name: Date of Service: [name], [name] 06/14/2022 8:00 A M Medical Record Number: [id number] Patient Account Number: [id number] Date of Birth/Sex: Treating RN: 06-17-1951 (71 y.o. [name], [name] Primary Care Aurthur Wingerter: Barbette Reichmann Other Clinician: Izetta Dakin Referring Cheray Pardi: Treating Alessia Gonsalez/Extender: RO BSO N, MICHA EL G Hande, Vishwanath Weeks in Treatment: 22 HBO Safety Checklist Items Safety Checklist Consent Form Signed Patient voided / foley secured and emptied When did you last eato 07:00 am Last dose of injectable or oral agent 06/13/22 pm Ostomy pouch emptied and vented if applicable NA All implantable devices assessed, documented and approved NA Intravenous access site secured and place NA Valuables secured Linens and cotton and cotton/polyester blend (less than 51% polyester) Personal oil-based products / skin lotions / body lotions removed Wigs or hairpieces removed NA Smoking or tobacco materials removed NA Books / newspapers / magazines / loose paper removed NA Cologne, aftershave, perfume and deodorant removed Jewelry removed (may wrap wedding band) Make-up removed NA Hair care products removed Battery operated devices (external) removed NA Heating patches and chemical warmers removed NA Titanium eyewear  removed NA Nail polish cured greater than 10 hours NA Casting material cured greater than 10 hours NA Hearing aids removed NA Loose dentures or partials removed NA Prosthetics have been removed NA Patient demonstrates correct use of air break device (  if applicable) Patient concerns have been addressed Patient grounding bracelet on and cord attached to chamber Specifics for Inpatients (complete in addition to above) Medication sheet sent with patient Intravenous medications needed or due during therapy sent with patient Drainage tubes (e.g. nasogastric tube or chest tube secured and vented) Endotracheal or Tracheotomy tube secured Cuff deflated of air and inflated with saline Airway suctioned Electronic Signature(s) Signed: 06/14/2022 10:56:29 AM By: Aleda Grana RCP, RRT CHT , , Entered By: Aleda Grana on 06/14/2022 08:47:54 ,

## 2022-06-16 ENCOUNTER — Encounter: Payer: Medicare Other | Admitting: Internal Medicine

## 2022-06-16 DIAGNOSIS — E11621 Type 2 diabetes mellitus with foot ulcer: Secondary | ICD-10-CM | POA: Diagnosis not present

## 2022-06-16 LAB — GLUCOSE, CAPILLARY
Glucose-Capillary: 189 mg/dL — ABNORMAL HIGH (ref 70–99)
Glucose-Capillary: 103 mg/dL — ABNORMAL HIGH (ref 70–99)

## 2022-06-16 NOTE — Progress Notes (Addendum)
PAXTEN, APPELT ([id number]) [id number] Page 1 of 2 Visit Report for 06/16/2022 Arrival Information Details Patient Name: Date of Service: Harvie Heck, [name] 06/16/2022 8:00 A M Medical Record Number: [id number] Patient Account Number: [id number] Date of Birth/Sex: Treating RN: 06-01-1951 (71 y.o. [name] Primary Care Yomayra Tate: Barbette Reichmann Other Clinician: Izetta Dakin Referring Amram Maya: Treating Therese Rocco/Extender: RO BSO N, MICHA EL G Hande, Vishwanath Weeks in Treatment: 22 Visit Information History Since Last Visit Added or deleted any medications: No Patient Arrived: Ambulatory Pain Present Now: No Arrival Time: 08:10 Accompanied By: self Transfer Assistance: None Patient Identification Verified: Yes Secondary Verification Process Completed: Yes Patient Requires Transmission-Based Precautions: No Patient Has Alerts: Yes Patient Alerts: Patient on Blood Thinner 01/07/2022 [place]>220 on left Electronic Signature(s) Signed: 06/16/2022 8:26:21 AM By: Elliot Gurney, BSN, RN, CWS, Kim RN, BSN Entered By: Elliot Gurney, BSN, RN, CWS, Kim on 06/16/2022 86:77:37 -------------------------------------------------------------------------------- Encounter Discharge Information Details Patient Name: Date of Service: [name], [name] 06/16/2022 8:00 A M Medical Record Number: [id number] Patient Account Number: [id number] Date of Birth/Sex: Treating RN: [birth year] (70 y.o. [name] Primary Care Atharva Mirsky: Barbette Reichmann Other Clinician: Izetta Dakin Referring Jamien Casanova: Treating Landa Mullinax/Extender: RO BSO N, MICHA EL G Hande, Vishwanath Weeks in Treatment: 22 Encounter Discharge Information Items Discharge Condition: Stable Ambulatory Status: Cane Discharge Destination: Home Transportation: Private Auto Accompanied By: self Schedule Follow-up Appointment: Yes Clinical Summary of Care: Electronic Signature(s) NEALE, MARZETTE ([id number])  [id number] Page 2 of 2 Signed: 06/16/2022 11:11:14 AM By: Elliot Gurney, BSN, RN, CWS, Kim RN, BSN Entered By: Elliot Gurney, BSN, RN, CWS, Kim on 06/16/2022 11:11:14 -------------------------------------------------------------------------------- Vitals Details Patient Name: Date of Service: [name], [name] 06/16/2022 8:00 A M Medical Record Number: [id number] Patient Account Number: [id number] Date of Birth/Sex: Treating RN: 1951-07-01 (71 y.o. [name] Primary Care Zaniyah Wernette: Barbette Reichmann Other Clinician: Izetta Dakin Referring Johnluke Haugen: Treating Sanoe Hazan/Extender: RO BSO N, MICHA EL G Hande, Vishwanath Weeks in Treatment: 22 Vital Signs Time Taken: 08:10 Temperature (F): 98.4 Height (in): 68 Pulse (bpm): 68 Weight (lbs): 217 Respiratory Rate (breaths/min): 18 Body Mass Index (BMI): 33 Blood Pressure (mmHg): 168/82 Capillary Blood Glucose (mg/dl): 478 Reference Range: 80 - 120 mg / dl Electronic Signature(s) Signed: 06/16/2022 8:26:54 AM By: Elliot Gurney, BSN, RN, CWS, Kim RN, BSN Entered By: Elliot Gurney, BSN, RN, CWS, Kim on 06/16/2022 08:26:53

## 2022-06-16 NOTE — Progress Notes (Addendum)
KREE, RAFTER ([id number]) [id number] Page 1 of 3 Visit Report for 06/16/2022 HBO Details Patient Name: Date of Service: Gene Brown, [name] 06/16/2022 8:00 A M Medical Record Number: [id number] Patient Account Number: [id number] Date of Birth/Sex: Treating RN: 1950-10-10 (71 y.o. 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 HBO Treatment Details Treatment Number: 31 Patient Type: Outpatient Chamber Type: Monoplace Chamber Serial #: [id number] Treatment Protocol: 2.0 ATA with 90 minutes oxygen, and no air breaks Treatment Details Compression Rate Down: 1.5 psi / minute De-Compression Rate Up: 1.5 psi / minute Air breaks and breathing Decompress Decompress Compress Tx Pressure Begins Reached periods Begins Ends (leave unused spaces blank) Chamber Pressure (ATA 1 2 ------2 1 ) Clock Time (24 hr) 08:25 08:36 - - - - - - 10:07 10:19 Treatment Length: 114 (minutes) Treatment Segments: 4 Vital Signs Capillary Blood Glucose Reference Range: 80 - 120 mg / dl HBO Diabetic Blood Glucose Intervention Range: <131 mg/dl or >024 mg/dl Type: Time Vitals Blood Respiratory Capillary Blood Glucose Pulse Action Pulse: Temperature: Taken: Pressure: Rate: Glucose (mg/dl): Meter #: Oximetry (%) Taken: Pre 08:10 168/82 68 18 98.4 189 started treatment Post 10:45 148/78 68 16 97.6 103 discharged patient Treatment Response Treatment Toleration: Well Treatment Completion Status: Treatment Completed without Adverse Event Ceceilia Cephus Notes No concerns with treatment given HBO Attestation I certify that  I supervised this HBO treatment in accordance with Medicare guidelines. A trained emergency response team is readily available per Yes hospital policies and procedures. Continue HBOT as ordered. Yes Electronic Signature(s) TUDOR, CHANDLEY ([id number]) [id number] Page 2 of 3 Signed: 06/16/2022 3:56:39 PM By: Baltazar Najjar MD Previous Signature: 06/16/2022 11:10:25 AM Version By: Elliot Gurney BSN, RN, CWS, Kim RN, BSN Previous Signature: 06/16/2022 10:18:41 AM Version By: Elliot Gurney, BSN, RN, CWS, Kim RN, BSN Previous Signature: 06/16/2022 10:08:43 AM Version By: Elliot Gurney BSN, RN, CWS, Kim RN, BSN Previous Signature: 06/16/2022 9:01:00 AM Version By: Elliot Gurney, BSN, RN, CWS, Kim RN, BSN Previous Signature: 06/16/2022 8:29:27 AM Version By: Elliot Gurney, BSN, RN, CWS, Kim RN, BSN Entered By: Baltazar Najjar on 06/16/2022 15:42:54 -------------------------------------------------------------------------------- HBO Safety Checklist Details Patient Name: Date of Service: Gene Heck, [name] 06/16/2022 8:00 A M Medical Record Number: [id number] Patient Account Number: [id number] Date of Birth/Sex: Treating RN: 1950-10-02 (71 y.o. [name] Primary Care Xinyi Batton: Barbette Reichmann Other Clinician: Izetta Dakin Referring Roxana Lai: Treating Lebron Nauert/Extender: RO BSO N, MICHA EL G Hande, Vishwanath Weeks in Treatment: 22 HBO Safety Checklist Items Safety Checklist Consent Form Signed Patient voided / foley secured and emptied When did you last eato am Last dose of injectable or oral agent last night Ostomy pouch emptied and vented if applicable NA All implantable devices assessed, documented and approved NA Intravenous access site secured and place NA Valuables secured Linens and cotton and cotton/polyester blend (less than 51% polyester) Personal oil-based products / skin lotions / body lotions removed Wigs or hairpieces removed NA Smoking or tobacco materials removed NA Books /  newspapers / magazines / loose paper removed Cologne, aftershave, perfume and deodorant removed Jewelry removed (may wrap wedding band) NA Make-up removed NA Hair care products removed Battery operated devices (external) removed NA Heating patches and chemical  warmers removed NA Titanium eyewear removed NA Nail polish cured greater than 10 hours NA Casting material cured greater than 10 hours NA Hearing aids removed Loose dentures or partials removed NA Prosthetics have been removed NA Patient demonstrates correct use of air break device (if applicable) Patient concerns have been addressed Patient grounding bracelet on and cord attached to chamber Specifics for Inpatients (complete in addition to above) Medication sheet sent with patient Intravenous medications needed or due during therapy sent with patient Drainage tubes (e.g. nasogastric tube or chest tube secured and vented) Endotracheal or Tracheotomy tube secured Cuff deflated of air and inflated with saline Airway suctioned Electronic Signature(s) Signed: 06/16/2022 8:28:41 AM By: Elliot Gurney, BSN, RN, CWS, Kim RN, BSN [place], [place] V ([id number]) [id number] Page 3 of 3 Entered By: Elliot Gurney, BSN, RN, CWS, Kim on 06/16/2022 08:28:40

## 2022-06-17 ENCOUNTER — Encounter: Payer: Medicare Other | Admitting: Internal Medicine

## 2022-06-17 DIAGNOSIS — E11621 Type 2 diabetes mellitus with foot ulcer: Secondary | ICD-10-CM | POA: Diagnosis not present

## 2022-06-17 LAB — GLUCOSE, CAPILLARY: Glucose-Capillary: 205 mg/dL — ABNORMAL HIGH (ref 70–99)

## 2022-06-17 NOTE — Progress Notes (Signed)
Gene Brown ([id number]) [id number] Page 1 of 2 Visit Report for 06/17/2022 HBO Details Patient Name: Date of Service: Gene Brown, Gene Brown UL V. 06/17/2022 8:00 A M Medical Record Number: [id number] Patient Account Number: [id number] Date of Birth/Sex: Treating RN: [birth year] (71 y.o. 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 HBO Treatment Details Treatment Number: 32 Patient Type: Outpatient Chamber Type: Monoplace Chamber Serial #: [id number] Treatment Protocol: 2.0 ATA with 90 minutes oxygen, and no air breaks Treatment Details Compression Rate Down: 1.5 psi / minute De-Compression Rate Up: 1.5 psi / minute Air breaks and breathing Decompress Decompress Compress Tx Pressure Begins Reached periods Begins Ends (leave unused spaces blank) Chamber Pressure (ATA 1 2 ------2 1 ) Clock Time (24 hr) 08:55 09:07 - - - - - - 10:35 10:49 Treatment Length: 114 (minutes) Treatment Segments: 4 Vital Signs Capillary Blood Glucose Reference Range: 80 - 120 mg / dl HBO Diabetic Blood Glucose Intervention Range: <131 mg/dl or >242 mg/dl Type: Time Vitals Blood Respiratory Capillary Blood Glucose Pulse Action Pulse: Temperature: Taken: Pressure: Rate: Glucose (mg/dl): Meter #: Oximetry (%) Taken: Pre 08:46 122/68 90 18 97.8 205 1 none per protocol Post 11:14 118/72 84 18 97.8 124 1 none per protocol Treatment Response Treatment Toleration: Well Treatment Completion Status: Treatment Completed without Adverse Event Iolanda Folson Notes No concerns with treatment given HBO Attestation I certify  that I supervised this HBO treatment in accordance with Medicare guidelines. A trained emergency response team is readily available per Yes hospital policies and procedures. Continue HBOT as ordered. Yes Electronic Signature(s) Gene Brown ([id number]) [id number] Page 2 of 2 Signed: 06/17/2022 12:57:19 PM By: Gene Najjar MD Previous Signature: 06/17/2022 12:20:23 PM Version By: Gene Brown RCP, RRT CHT , , Entered By: Gene Brown on 06/17/2022 12:56:02 -------------------------------------------------------------------------------- HBO Safety Checklist Details Patient Name: Date of Service: [name], [name] 06/17/2022 8:00 A M Medical Record Number: [id number] Patient Account Number: [id number] Date of Birth/Sex: Treating RN: Gene Brown (71 y.o. Gene Brown, Gene Brown Primary Care Gene Brown: Gene Brown Other Clinician: Izetta Brown Referring Gene Brown: Treating Gene Brown: Gene Brown in Treatment: 23 HBO Safety Checklist Items Safety Checklist Consent Form Signed Patient voided / foley secured and emptied When did you last eato 07:00 am Last dose of injectable or oral agent 06/16/22 pm Ostomy pouch emptied and vented if applicable NA All implantable devices assessed, documented and approved NA Intravenous access site secured and place NA Valuables secured Linens and cotton and cotton/polyester blend (less than 51% polyester) Personal oil-based products / skin lotions / body lotions removed Wigs or hairpieces removed NA Smoking or tobacco materials removed NA Books / newspapers / magazines / loose paper removed NA Cologne, aftershave, perfume and deodorant removed Jewelry removed (may wrap wedding band) Make-up removed NA Hair care products removed Battery operated devices (external) removed NA Heating patches and chemical warmers removed NA Titanium eyewear removed NA Nail  polish cured greater than 10 hours NA Casting material cured greater than 10 hours NA Hearing aids removed Loose dentures or partials removed NA Prosthetics have been removed NA Patient demonstrates correct use of air break device (if applicable) Patient concerns have  been addressed Patient grounding bracelet on and cord attached to chamber Specifics for Inpatients (complete in addition to above) Medication sheet sent with patient Intravenous medications needed or due during therapy sent with patient Drainage tubes (e.g. nasogastric tube or chest tube secured and vented) Endotracheal or Tracheotomy tube secured Cuff deflated of air and inflated with saline Airway suctioned Electronic Signature(s) Signed: 06/17/2022 12:20:23 PM By: Gene Brown RCP, RRT CHT , , Entered By: Gene Brown on 06/17/2022 10:32:05 ,

## 2022-06-17 NOTE — Progress Notes (Signed)
DAYMEON, FISCHMAN ([id number]) [id number] Page 1 of 2 Visit Report for 06/17/2022 Arrival Information Details Patient Name: Date of Service: Gene Brown, [name] 06/17/2022 8:00 A M Medical Record Number: [id number] Patient Account Number: [id number] Date of Birth/Sex: Treating RN: 04-28-1951 (71 y.o. [name], [name] Primary Care Brach Birdsall: Barbette Reichmann Other Clinician: Izetta Dakin Referring Efrain Clauson: Treating Draycen Leichter/Extender: RO BSO N, MICHA EL G Hande, Vishwanath Weeks in Treatment: 23 Visit Information History Since Last Visit Added or deleted any medications: No Patient Arrived: [name] Any new allergies or adverse reactions: No Arrival Time: 08:30 Had a fall or experienced change in No Accompanied By: self activities of daily living that may affect Transfer Assistance: None risk of falls: Patient Identification Verified: Yes Signs or symptoms of abuse/neglect since last visito No Secondary Verification Process Completed: Yes Hospitalized since last visit: No Patient Requires Transmission-Based Precautions: No Implantable device outside of the clinic excluding No Patient Has Alerts: Yes cellular tissue based products placed in the center Patient Alerts: Patient on Blood Thinner since last visit: 01/07/2022 [place]>220 on left Pain Present Now: No Electronic Signature(s) Signed: 06/17/2022 12:20:23 PM By: Aleda Grana RCP, RRT CHT , , Entered By: Aleda Grana on 06/17/2022 10:29:52 , -------------------------------------------------------------------------------- Encounter Discharge Information Details Patient Name: Date of Service: [name], [name] 06/17/2022 8:00 A M Medical Record Number: [id number] Patient Account Number: [id number] Date of Birth/Sex: Treating RN: [birth year] (71 y.o. Gene Brown Primary Care Rutledge Selsor: Barbette Reichmann Other Clinician: Izetta Dakin Referring Jazia Faraci: Treating Lashan Macias/Extender: RO  BSO N, MICHA EL G Hande, Vishwanath Weeks in Treatment: 23 Encounter Discharge Information Items Discharge Condition: Stable Ambulatory Status: Knee Scooter Discharge Destination: Home Transportation: Private Auto Accompanied By: self Schedule Follow-up Appointment: Yes Clinical Summary of Care: Gene Brown, Gene Brown ([id number]) [id number] Page 2 of 2 Notes Patient has an HBO treatment scheduled on 06/20/22 at 08:00 am. Electronic Signature(s) Signed: 06/17/2022 12:20:23 PM By: Aleda Grana RCP, RRT CHT , , Entered By: Aleda Grana on 06/17/2022 12:15:50 , -------------------------------------------------------------------------------- Vitals Details Patient Name: Date of Service: [name], [name] 06/17/2022 8:00 A M Medical Record Number: [id number] Patient Account Number: [id number] Date of Birth/Sex: Treating RN: 29-Apr-1951 (71 y.o. Gene Brown Primary Care Donte Lenzo: Barbette Reichmann Other Clinician: Izetta Dakin Referring Delle Andrzejewski: Treating Amer Alcindor/Extender: RO BSO N, MICHA EL G Hande, Vishwanath Weeks in Treatment: 23 Vital Signs Time Taken: 08:46 Temperature (F): 97.8 Height (in): 68 Pulse (bpm): 90 Weight (lbs): 217 Respiratory Rate (breaths/min): 18 Body Mass Index (BMI): 33 Blood Pressure (mmHg): 122/68 Capillary Blood Glucose (mg/dl): 220 Reference Range: 80 - 120 mg / dl Electronic Signature(s) Signed: 06/17/2022 12:20:23 PM By: Aleda Grana RCP, RRT CHT , , Entered By: Aleda Grana on 06/17/2022 10:30:37 ,

## 2022-06-20 ENCOUNTER — Encounter: Payer: Medicare Other | Admitting: Physician Assistant

## 2022-06-20 DIAGNOSIS — E11621 Type 2 diabetes mellitus with foot ulcer: Secondary | ICD-10-CM | POA: Diagnosis not present

## 2022-06-20 LAB — GLUCOSE, CAPILLARY
Glucose-Capillary: 124 mg/dL — ABNORMAL HIGH (ref 70–99)
Glucose-Capillary: 212 mg/dL — ABNORMAL HIGH (ref 70–99)
Glucose-Capillary: 172 mg/dL — ABNORMAL HIGH (ref 70–99)

## 2022-06-20 NOTE — Progress Notes (Signed)
DESHAN, HEMMELGARN ([id number]) [id number] Page 1 of 2 Visit Report for 06/20/2022 Arrival Information Details Patient Name: Date of Service: Gene Brown, Gene Brown UL V. 06/20/2022 8:00 A M Medical Record Number: [id number] Patient Account Number: [id number] Date of Birth/Sex: Treating RN: 05/31/1951 (71 y.o. Gene Brown, [name] Primary Care Zubayr Bednarczyk: Barbette Reichmann Other Clinician: Izetta Dakin Referring Allard Lightsey: Treating Omarion Minnehan/Extender: Jearld Shines Weeks in Treatment: 23 Visit Information History Since Last Visit Added or deleted any medications: No Patient Arrived: Gene Brown Any new allergies or adverse reactions: No Arrival Time: 07:50 Had a fall or experienced change in No Accompanied By: self activities of daily living that may affect Transfer Assistance: None risk of falls: Patient Identification Verified: Yes Signs or symptoms of abuse/neglect since last visito No Secondary Verification Process Completed: Yes Hospitalized since last visit: No Patient Requires Transmission-Based Precautions: No Implantable device outside of the clinic excluding No Patient Has Alerts: Yes cellular tissue based products placed in the center Patient Alerts: Patient on Blood Thinner since last visit: 01/07/2022 [place]>220 on left Pain Present Now: No Electronic Signature(s) Signed: 06/20/2022 11:44:07 AM By: Aleda Grana RCP, RRT CHT , , Entered By: Aleda Grana on 06/20/2022 09:35:00 , -------------------------------------------------------------------------------- Encounter Discharge Information Details Patient Name: Date of Service: Gene Brown UL V. 06/20/2022 8:00 A M Medical Record Number: [id number] Patient Account Number: [id number] Date of Birth/Sex: Treating RN: 1951-06-04 (71 y.o. Gene Brown Primary Care Chrisotpher Rivero: Barbette Reichmann Other Clinician: Izetta Dakin Referring Marlyn Tondreau: Treating Charmion Hapke/Extender: Jearld Shines Weeks in Treatment: 23 Encounter Discharge Information Items Discharge Condition: Stable Ambulatory Status: Cane Discharge Destination: Home Transportation: Private Auto Accompanied By: self Schedule Follow-up Appointment: Yes Clinical Summary of Care: Gene Brown ([id number]) [id number] Page 2 of 2 Notes Patient has an HBO treatment scheduled on 06/21/22 at 08:00 am. Electronic Signature(s) Signed: 06/20/2022 11:44:07 AM By: Aleda Grana RCP, RRT CHT , , Entered By: Aleda Grana on 06/20/2022 11:41:02 , -------------------------------------------------------------------------------- Vitals Details Patient Name: Date of Service: Gene Brown UL V. 06/20/2022 8:00 A M Medical Record Number: [id number] Patient Account Number: [id number] Date of Birth/Sex: Treating RN: [birth year] (71 y.o. Gene Brown Primary Care Izacc Demeyer: Barbette Reichmann Other Clinician: Izetta Dakin Referring Johnathon Olden: Treating Kahlen Boyde/Extender: Jearld Shines Weeks in Treatment: 23 Vital Signs Time Taken: 08:09 Temperature (F): 97.8 Height (in): 68 Pulse (bpm): 72 Weight (lbs): 217 Respiratory Rate (breaths/min): 18 Body Mass Index (BMI): 33 Blood Pressure (mmHg): 132/76 Capillary Blood Glucose (mg/dl): 502 Reference Range: 80 - 120 mg / dl Electronic Signature(s) Signed: 06/20/2022 11:44:07 AM By: Aleda Grana RCP, RRT CHT , , Entered By: Aleda Grana on 06/20/2022 09:36:03 ,

## 2022-06-21 ENCOUNTER — Encounter: Payer: Medicare Other | Admitting: Physician Assistant

## 2022-06-21 ENCOUNTER — Ambulatory Visit: Payer: Medicare Other | Admitting: Physician Assistant

## 2022-06-21 DIAGNOSIS — E11621 Type 2 diabetes mellitus with foot ulcer: Secondary | ICD-10-CM | POA: Diagnosis not present

## 2022-06-21 LAB — GLUCOSE, CAPILLARY
Glucose-Capillary: 303 mg/dL — ABNORMAL HIGH (ref 70–99)
Glucose-Capillary: 135 mg/dL — ABNORMAL HIGH (ref 70–99)

## 2022-06-21 NOTE — Progress Notes (Addendum)
MAZI, BRAILSFORD ([id number]) [id number] Page 1 of 2 Visit Report for 06/21/2022 Arrival Information Details Patient Name: Date of Service: Gene Brown, [name] 06/21/2022 8:00 A M Medical Record Number: [id number] Patient Account Number: [id number] Date of Birth/Sex: Treating RN: Dec 01, 1950 (71 y.o. [name] Primary Care Bryan Omura: Barbette Reichmann Other Clinician: Izetta Dakin Referring Canyon Lohr: Treating Wynton Hufstetler/Extender: Jearld Shines Weeks in Treatment: 23 Visit Information History Since Last Visit Pain Present Now: No Patient Arrived: Cane Arrival Time: 08:24 Accompanied By: self Transfer Assistance: None Patient Identification Verified: Yes Secondary Verification Process Completed: Yes Patient Requires Transmission-Based Precautions: No Patient Has Alerts: Yes Patient Alerts: Patient on Blood Thinner 01/07/2022 [place]>220 on left Electronic Signature(s) Signed: 06/21/2022 8:24:21 AM By: Elliot Gurney, BSN, RN, CWS, Kim RN, BSN Entered By: Elliot Gurney, BSN, RN, CWS, Kim on 06/21/2022 08:24:20 -------------------------------------------------------------------------------- Encounter Discharge Information Details Patient Name: Date of Service: Gene Heck, [name] 06/21/2022 8:00 A M Medical Record Number: [id number] Patient Account Number: [id number] Date of Birth/Sex: Treating RN: Nov 29, 1950 (71 y.o. [name] Primary Care Jaedan Schuman: Barbette Reichmann Other Clinician: Izetta Dakin Referring Adriannah Steinkamp: Treating Vegas Coffin/Extender: Jearld Shines Weeks in Treatment: 51 Encounter Discharge Information Items Discharge Condition: Stable Ambulatory Status: Ambulatory Discharge Destination: Home Transportation: Private Auto Accompanied By: self Schedule Follow-up Appointment: Yes Clinical Summary of Care: Electronic Signature(s) ALECK, LOCKLIN ([id number]) [id number] Page 2 of 2 Signed: 06/21/2022  11:53:10 AM By: Elliot Gurney, BSN, RN, CWS, Kim RN, BSN Entered By: Elliot Gurney, BSN, RN, CWS, Kim on 06/21/2022 11:53:10 -------------------------------------------------------------------------------- Vitals Details Patient Name: Date of Service: [name], [name] 06/21/2022 8:00 A M Medical Record Number: [id number] Patient Account Number: [id number] Date of Birth/Sex: Treating RN: [birth year] (71 y.o. [name] Primary Care Jazmina Muhlenkamp: Barbette Reichmann Other Clinician: Izetta Dakin Referring Jessice Madill: Treating Dion Sibal/Extender: Jearld Shines Weeks in Treatment: 23 Vital Signs Time Taken: 08:11 Temperature (F): 97.6 Height (in): 68 Pulse (bpm): 72 Weight (lbs): 217 Respiratory Rate (breaths/min): 18 Body Mass Index (BMI): 33 Blood Pressure (mmHg): 122/68 Capillary Blood Glucose (mg/dl): 086 Reference Range: 80 - 120 mg / dl Electronic Signature(s) Signed: 06/21/2022 8:24:47 AM By: Elliot Gurney, BSN, RN, CWS, Kim RN, BSN Entered By: Elliot Gurney, BSN, RN, CWS, Kim on 06/21/2022 08:24:47

## 2022-06-21 NOTE — Progress Notes (Signed)
Gene Brown ([id number]) [id number] Page 1 of 2 Visit Report for 06/21/2022 HBO Details Patient Name: Date of Service: Gene Brown, Gene Brown UL V. 06/21/2022 8:00 A M Medical Record Number: [id number] Patient Account Number: [id number] Date of Birth/Sex: Treating RN: 1950-09-06 (71 y.o. 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 HBO Treatment Details Treatment Number: 34 Patient Type: Outpatient Chamber Type: Monoplace Chamber Serial #: [id number] Treatment Protocol: 2.0 ATA with 90 minutes oxygen, and no air breaks Treatment Details Compression Rate Down: 1.5 psi / minute De-Compression Rate Up: 1.5 psi / minute Air breaks and breathing Decompress Decompress Compress Tx Pressure Begins Reached periods Begins Ends (leave unused spaces blank) Chamber Pressure (ATA 1 2 ------2 1 ) Clock Time (24 hr) 08:17 08:29 - - - - - - 10:00 10:10 Treatment Length: 113 (minutes) Treatment Segments: 4 Vital Signs Capillary Blood Glucose Reference Range: 80 - 120 mg / dl HBO Diabetic Blood Glucose Intervention Range: <131 mg/dl or >149 mg/dl Type: Time Vitals Blood Respiratory Capillary Blood Glucose Pulse Action Pulse: Temperature: Taken: Pressure: Rate: Glucose (mg/dl): Meter #: Oximetry (%) Taken: Pre 08:11 122/68 72 18 97.6 303 treatment started Post 10:35 130/78 72 18 97.8 discharged patient Treatment Response Treatment Toleration: Well Treatment Completion Status: Treatment Completed without Adverse Event HBO Attestation I certify that I supervised this HBO treatment in accordance with  Medicare guidelines. A trained emergency response team is readily available per Yes hospital policies and procedures. Continue HBOT as ordered. Yes Electronic Signature(s) Signed: 06/21/2022 5:54:30 PM By: Lenda Kelp PA-C Previous Signature: 06/21/2022 11:52:22 AM Version By: Elliot Gurney BSN, RN, CWS, Kim RN, BSN Previous Signature: 06/21/2022 8:33:44 AM Version By: Elliot Gurney BSN, RN, CWS, Kim RN, BSN Previous Signature: 06/21/2022 8:30:43 AM Version By: Elliot Gurney BSN, RN, CWS, Kim RN, BSN Previous Signature: 06/21/2022 8:29:12 AM Version By: Elliot Gurney BSN, RN, CWS, Kim RN, BSN [place] V ([id number]) [id number] Page 2 of 2 Previous Signature: 06/21/2022 8:29:12 AM Version By: Elliot Gurney, BSN, RN, CWS, Kim RN, BSN Entered By: Lenda Kelp on 06/21/2022 17:54:30 -------------------------------------------------------------------------------- HBO Safety Checklist Details Patient Name: Date of Service: Gene NS, [name] 06/21/2022 8:00 A M Medical Record Number: [id number] Patient Account Number: [id number] Date of Birth/Sex: Treating RN: 06/16/1951 (71 y.o. [name], [name] Primary Care Elfrida Pixley: Barbette Reichmann Other Clinician: Izetta Dakin Referring Azoria Abbett: Treating Enrigue Hashimi/Extender: Jearld Shines Weeks in Treatment: 23 HBO Safety Checklist Items Safety Checklist Consent Form Signed Patient voided / foley secured and emptied When did you last eato am Last dose of injectable or oral agent pm Ostomy pouch emptied and vented if applicable NA All implantable devices assessed, documented and approved NA Intravenous access site secured and place NA Valuables secured Linens and cotton and cotton/polyester blend (less than 51% polyester) Personal oil-based products / skin lotions / body lotions removed Wigs or hairpieces removed NA Smoking or tobacco materials removed NA Books / newspapers / magazines / loose paper removed NA Cologne,  aftershave, perfume and deodorant removed Jewelry removed (may wrap wedding band) NA Make-up removed NA Hair care products removed Battery operated devices (external) removed NA Heating patches and chemical warmers removed NA Titanium eyewear removed NA Nail polish cured greater than 10 hours  NA Casting material cured greater than 10 hours NA Hearing aids removed Loose dentures or partials removed NA Prosthetics have been removed NA Patient demonstrates correct use of air break device (if applicable) Patient concerns have been addressed Patient grounding bracelet on and cord attached to chamber Specifics for Inpatients (complete in addition to above) Medication sheet sent with patient Intravenous medications needed or due during therapy sent with patient Drainage tubes (e.g. nasogastric tube or chest tube secured and vented) Endotracheal or Tracheotomy tube secured Cuff deflated of air and inflated with saline Airway suctioned Electronic Signature(s) Signed: 06/21/2022 8:25:48 AM By: Elliot Gurney, BSN, RN, CWS, Kim RN, BSN Entered By: Elliot Gurney, BSN, RN, CWS, Kim on 06/21/2022 08:25:47

## 2022-06-22 ENCOUNTER — Encounter (HOSPITAL_BASED_OUTPATIENT_CLINIC_OR_DEPARTMENT_OTHER): Payer: Medicare Other | Admitting: Internal Medicine

## 2022-06-22 DIAGNOSIS — E11621 Type 2 diabetes mellitus with foot ulcer: Secondary | ICD-10-CM | POA: Diagnosis not present

## 2022-06-22 DIAGNOSIS — M86672 Other chronic osteomyelitis, left ankle and foot: Secondary | ICD-10-CM

## 2022-06-22 DIAGNOSIS — L97522 Non-pressure chronic ulcer of other part of left foot with fat layer exposed: Secondary | ICD-10-CM

## 2022-06-22 LAB — GLUCOSE, CAPILLARY
Glucose-Capillary: 170 mg/dL — ABNORMAL HIGH (ref 70–99)
Glucose-Capillary: 88 mg/dL (ref 70–99)

## 2022-06-22 NOTE — Progress Notes (Signed)
JASTIN, FORE ([id number]) [id number] Page 1 of 2 Visit Report for 06/22/2022 Arrival Information Details Patient Name: Date of Service: Gene Brown, [name] 06/22/2022 8:00 A M Medical Record Number: [id number] Patient Account Number: [id number] Date of Birth/Sex: Treating RN: [birth year] (71 y.o. [name]) Yevonne Pax Primary Care Taeko Schaffer: Barbette Reichmann Other Clinician: Izetta Dakin Referring Carolanne Mercier: Treating Timisha Mondry/Extender: Adolph Pollack Weeks in Treatment: 23 Visit Information History Since Last Visit Added or deleted any medications: No Patient Arrived: [name] Any new allergies or adverse reactions: No Arrival Time: 07:50 Had a fall or experienced change in No Accompanied By: self activities of daily living that may affect Transfer Assistance: None risk of falls: Patient Identification Verified: Yes Signs or symptoms of abuse/neglect since last visito No Secondary Verification Process Completed: Yes Hospitalized since last visit: No Patient Requires Transmission-Based Precautions: No Implantable device outside of the clinic excluding No Patient Has Alerts: Yes cellular tissue based products placed in the center Patient Alerts: Patient on Blood Thinner since last visit: 01/07/2022 [place]>220 on left Pain Present Now: No Electronic Signature(s) Signed: 06/22/2022 1:10:53 PM By: Aleda Grana RCP, RRT CHT , , Entered By: Aleda Grana on 06/22/2022 08:42:22 , -------------------------------------------------------------------------------- Encounter Discharge Information Details Patient Name: Date of Service: Gene Heck, [name] 06/22/2022 8:00 A M Medical Record Number: [id number] Patient Account Number: [id number] Date of Birth/Sex: Treating RN: 06-Mar-1951 (71 y.o. [name]) Yevonne Pax Primary Care Birdell Frasier: Barbette Reichmann Other Clinician: Izetta Dakin Referring Phoenix Riesen: Treating Ethanael Veith/Extender:  Adolph Pollack Weeks in Treatment: 12 Encounter Discharge Information Items Discharge Condition: Stable Ambulatory Status: Cane Discharge Destination: Home Transportation: Private Auto Accompanied By: self Schedule Follow-up Appointment: Yes Clinical Summary of Care: CLIFFTON, SPRADLEY ([id number]) [id number] Page 2 of 2 Notes Patient has an HBO treatment scheduled on 06/23/22 at 08:00 am. Electronic Signature(s) Signed: 06/22/2022 1:10:53 PM By: Aleda Grana RCP, RRT CHT , , Entered By: Aleda Grana on 06/22/2022 13:10:34 , -------------------------------------------------------------------------------- Vitals Details Patient Name: Date of Service: [name], [name] 06/22/2022 8:00 A M Medical Record Number: [id number] Patient Account Number: [id number] Date of Birth/Sex: Treating RN: 1951/07/31 (71 y.o. [name]) Yevonne Pax Primary Care Armelia Penton: Barbette Reichmann Other Clinician: Izetta Dakin Referring Milen Lengacher: Treating Gilberto Streck/Extender: Adolph Pollack Weeks in Treatment: 23 Vital Signs Time Taken: 08:09 Temperature (F): 97.8 Height (in): 68 Pulse (bpm): 66 Weight (lbs): 217 Respiratory Rate (breaths/min): 18 Body Mass Index (BMI): 33 Blood Pressure (mmHg): 118/74 Capillary Blood Glucose (mg/dl): 500 Reference Range: 80 - 120 mg / dl Electronic Signature(s) Signed: 06/22/2022 1:10:53 PM By: Aleda Grana RCP, RRT CHT , , Entered By: Aleda Grana on 06/22/2022 08:43:36 ,

## 2022-06-22 NOTE — Progress Notes (Signed)
BROK, STOCKING ([id number]) [id number] Page 1 of 2 Visit Report for 06/20/2022 Problem List Details Patient Name: Date of Service: Gene Brown, [name] 06/20/2022 8:00 A M Medical Record Number: [id number] Patient Account Number: [id number] Date of Birth/Sex: Treating RN: 02-26-1951 (71 y.o. 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 06/20/2022 Medical Record Number: [id number] Patient Account Number: [id number] Date of Birth/Sex: Treating RN: 10-Apr-1951 (71 y.o. 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.42 Type 2 diabetes mellitus with diabetic polyneuropathy I10 Essential (primary) hypertension Facility Procedures : CPT4 Code: 26834196 Description: (Facility Use Only) HBOT full body chamber, , Modifier: Quantity: 4 Physician Procedures : CPT4 Code Description Modifier [id number] - WC PHYS HYPERBARIC OXYGEN THERAPY ICD-10 Diagnosis Description M86.672 Other chronic osteomyelitis, left ankle and foot L97.522 Non-pressure chronic ulcer of other part of left foot with fat layer e  E17.408 Type 2 diabetes mellitus with foot ulcer Quantity: 1 xposed Electronic Signature(s) Signed: 06/21/2022 5:55:32 PM By: Lenda Kelp PA-C Previous Signature: 06/20/2022 11:44:07 AM Version By: Aleda Grana RCP, RRT CHT , , Entered By: Lenda Kelp on 06/21/2022 17:55:32

## 2022-06-22 NOTE — Progress Notes (Signed)
ZACKREY, DYAR ([id number]) [id number] Page 1 of 2 Visit Report for 06/20/2022 HBO Details Patient Name: Date of Service: Gene Brown, Gene Brown UL V. 06/20/2022 8:00 A M Medical Record Number: [id number] Patient Account Number: [id number] Date of Birth/Sex: Treating RN: 1950-12-26 (71 y.o. 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 HBO Treatment Details Treatment Number: 33 Patient Type: Outpatient Chamber Type: Monoplace Chamber Serial #: [id number] Treatment Protocol: 2.0 ATA with 90 minutes oxygen, and no air breaks Treatment Details Compression Rate Down: 1.5 psi / minute De-Compression Rate Up: 1.5 psi / minute Air breaks and breathing Decompress Decompress Compress Tx Pressure Begins Reached periods Begins Ends (leave unused spaces blank) Chamber Pressure (ATA 1 2 ------2 1 ) Clock Time (24 hr) 08:20 08:32 - - - - - - 10:02 10:12 Treatment Length: 112 (minutes) Treatment Segments: 4 Vital Signs Capillary Blood Glucose Reference Range: 80 - 120 mg / dl HBO Diabetic Blood Glucose Intervention Range: <131 mg/dl or >021 mg/dl Type: Time Vitals Blood Respiratory Capillary Blood Glucose Pulse Action Pulse: Temperature: Taken: Pressure: Rate: Glucose (mg/dl): Meter #: Oximetry (%) Taken: Pre 08:09 132/76 72 18 97.8 212 1 none per protocol Post 10:49 128/80 78 18 97.6 172 1 none per protocol Treatment Response Treatment Toleration: Well Treatment Completion Status: Treatment Completed without Adverse Event HBO Attestation I certify that I supervised this HBO treatment in accordance with  Medicare guidelines. A trained emergency response team is readily available per Yes hospital policies and procedures. Continue HBOT as ordered. Yes Electronic Signature(s) Signed: 06/21/2022 5:55:27 PM By: Lenda Kelp PA-C Previous Signature: 06/20/2022 11:44:07 AM Version By: Aleda Grana RCP, RRT CHT , , Entered By: Lenda Kelp on 06/21/2022 17:55:27 [name] ([id number]) [id number] Page 2 of 2 -------------------------------------------------------------------------------- HBO Safety Checklist Details Patient Name: Date of Service: Gene Brown, Gene Brown UL V. 06/20/2022 8:00 A M Medical Record Number: [id number] Patient Account Number: [id number] Date of Birth/Sex: Treating RN: Dec 30, 1950 (71 y.o. [name], [name] Primary Care Darya Bigler: Barbette Reichmann Other Clinician: Izetta Dakin Referring Kemberly Taves: Treating Kienna Moncada/Extender: Jearld Shines Weeks in Treatment: 23 HBO Safety Checklist Items Safety Checklist Consent Form Signed Patient voided / foley secured and emptied When did you last eato 07:00 am Last dose of injectable or oral agent 06/19/22 pm Ostomy pouch emptied and vented if applicable NA All implantable devices assessed, documented and approved NA Intravenous access site secured and place NA Valuables secured Linens and cotton and cotton/polyester blend (less than 51% polyester) Personal oil-based products / skin lotions / body lotions removed Wigs or hairpieces removed NA Smoking or tobacco materials removed NA Books / newspapers / magazines / loose paper removed NA Cologne, aftershave, perfume and deodorant removed Jewelry removed (may wrap wedding band) Make-up removed NA Hair care products removed Battery operated devices (external) removed NA Heating patches and chemical warmers removed NA Titanium eyewear removed NA Nail polish cured greater than 10 hours NA Casting material cured  greater than 10 hours NA Hearing aids removed Loose dentures or partials removed NA Prosthetics have been removed NA Patient demonstrates correct use of air break device (if applicable) Patient concerns have been addressed Patient grounding bracelet on and cord attached to chamber Specifics for  Inpatients (complete in addition to above) Medication sheet sent with patient Intravenous medications needed or due during therapy sent with patient Drainage tubes (e.g. nasogastric tube or chest tube secured and vented) Endotracheal or Tracheotomy tube secured Cuff deflated of air and inflated with saline Airway suctioned Electronic Signature(s) Signed: 06/20/2022 11:44:07 AM By: Aleda Grana RCP, RRT CHT , , Entered By: Aleda Grana on 06/20/2022 09:37:45 ,

## 2022-06-22 NOTE — Progress Notes (Signed)
BRONX, BROGDEN ([id number]) [id number] Page 1 of 2 Visit Report for 06/21/2022 Problem List Details Patient Name: Date of Service: Gene Brown, Gene Brown UL V. 06/21/2022 8:00 A M Medical Record Number: [id number] Patient Account Number: [id number] Date of Birth/Sex: Treating RN: [birth year] (71 y.o. 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 06/21/2022 Medical Record Number: [id number] Patient Account Number: [id number] Date of Birth/Sex: Treating RN: 08-31-1950 (71 y.o. 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.42 Type 2 diabetes mellitus with diabetic polyneuropathy I10 Essential (primary) hypertension Facility Procedures : CPT4 Code: 02774128 Description: (Facility Use Only) HBOT full body chamber, , Modifier: Quantity: 4 Physician Procedures : CPT4 Code Description Modifier [id number] 09470 - WC PHYS HYPERBARIC OXYGEN THERAPY ICD-10 Diagnosis Description M86.672 Other chronic osteomyelitis, left ankle and foot L97.522 Non-pressure chronic ulcer of other part of left foot with fat layer e  J62.836 Type 2 diabetes mellitus with foot ulcer E11.42 Type 2 diabetes mellitus with diabetic polyneuropathy Quantity: 1 xposed Electronic Signature(s) Signed: 06/21/2022 5:54:48 PM By: Lenda Kelp PA-C Entered By: Lenda Kelp on 06/21/2022 17:54:48

## 2022-06-23 ENCOUNTER — Encounter: Payer: Medicare Other | Admitting: Physician Assistant

## 2022-06-23 DIAGNOSIS — E11621 Type 2 diabetes mellitus with foot ulcer: Secondary | ICD-10-CM | POA: Diagnosis not present

## 2022-06-23 LAB — GLUCOSE, CAPILLARY
Glucose-Capillary: 166 mg/dL — ABNORMAL HIGH (ref 70–99)
Glucose-Capillary: 142 mg/dL — ABNORMAL HIGH (ref 70–99)

## 2022-06-23 NOTE — Progress Notes (Signed)
DAHLTON, HINDE ([id number]) [id number] Page 1 of 2 Visit Report for 06/23/2022 Arrival Information Details Patient Name: Date of Service: Gene Brown, [name] 06/23/2022 8:00 A M Medical Record Number: [id number] Patient Account Number: [id number] Date of Birth/Sex: Treating RN: 1951-07-18 (71 y.o. [name], [name] Primary Care Dontrey Snellgrove: Barbette Reichmann Other Clinician: Izetta Dakin Referring Sharanda Shinault: Treating Tika Hannis/Extender: Jearld Shines Weeks in Treatment: 23 Visit Information History Since Last Visit Added or deleted any medications: No Patient Arrived: [name] Any new allergies or adverse reactions: No Arrival Time: 07:50 Had a fall or experienced change in No Accompanied By: self activities of daily living that may affect Transfer Assistance: None risk of falls: Patient Identification Verified: Yes Signs or symptoms of abuse/neglect since last visito No Secondary Verification Process Completed: Yes Hospitalized since last visit: No Patient Requires Transmission-Based Precautions: No Implantable device outside of the clinic excluding No Patient Has Alerts: Yes cellular tissue based products placed in the center Patient Alerts: Patient on Blood Thinner since last visit: 01/07/2022 [place]>220 on left Pain Present Now: No Electronic Signature(s) Signed: 06/23/2022 11:09:11 AM By: Aleda Grana RCP, RRT CHT , , Entered By: Aleda Grana on 06/23/2022 08:24:46 , -------------------------------------------------------------------------------- Encounter Discharge Information Details Patient Name: Date of Service: Gene NS, [name] 06/23/2022 8:00 A M Medical Record Number: [id number] Patient Account Number: [id number] Date of Birth/Sex: Treating RN: Mar 19, 1951 (71 y.o. [name] Primary Care Delta Deshmukh: Barbette Reichmann Other Clinician: Izetta Dakin Referring Hermela Hardt: Treating Shayli Altemose/Extender: Jearld Shines Weeks in Treatment: 23 Encounter Discharge Information Items Discharge Condition: Stable Ambulatory Status: Cane Discharge Destination: Home Transportation: Private Auto Accompanied By: self Schedule Follow-up Appointment: Yes Clinical Summary of Care: Gene, Brown ([id number]) [id number] Page 2 of 2 Notes Patient has an HBO treatment scheduled on 06/24/22 at 08:00 am. Electronic Signature(s) Signed: 06/23/2022 11:09:11 AM By: Aleda Grana RCP, RRT CHT , , Entered By: Aleda Grana on 06/23/2022 11:08:49 , -------------------------------------------------------------------------------- Vitals Details Patient Name: Date of Service: Gene NS, [name] 06/23/2022 8:00 A M Medical Record Number: [id number] Patient Account Number: [id number] Date of Birth/Sex: Treating RN: 10-11-1950 (71 y.o. [name] Primary Care Pamelia Botto: Barbette Reichmann Other Clinician: Izetta Dakin Referring Zygmunt Mcglinn: Treating Krishawn Vanderweele/Extender: Jearld Shines Weeks in Treatment: 23 Vital Signs Time Taken: 08:04 Temperature (F): 97.9 Height (in): 68 Pulse (bpm): 78 Weight (lbs): 217 Respiratory Rate (breaths/min): 18 Body Mass Index (BMI): 33 Blood Pressure (mmHg): 128/72 Capillary Blood Glucose (mg/dl): 915 Reference Range: 80 - 120 mg / dl Electronic Signature(s) Signed: 06/23/2022 11:09:11 AM By: Aleda Grana RCP, RRT CHT , , Entered By: Aleda Grana on 06/23/2022 08:33:33 ,

## 2022-06-23 NOTE — Progress Notes (Signed)
KENO, CARAWAY ([id number]) [id number] Page 1 of 2 Visit Report for 06/22/2022 HBO Details Patient Name: Date of Service: Gene Brown, [name] 06/22/2022 8:00 A M Medical Record Number: [id number] Patient Account Number: [id number] Date of Birth/Sex: Treating RN: [birth year] (71 y.o. 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 HBO Treatment Details Treatment Number: 35 Patient Type: Outpatient Chamber Type: Monoplace Chamber Serial #: [id number] Treatment Protocol: 2.0 ATA with 90 minutes oxygen, and no air breaks Treatment Details Compression Rate Down: 1.5 psi / minute De-Compression Rate Up: 1.5 psi / minute Air breaks and breathing Decompress Decompress Compress Tx Pressure Begins Reached periods Begins Ends (leave unused spaces blank) Chamber Pressure (ATA 1 2 ------2 1 ) Clock Time (24 hr) 08:23 08:35 - - - - - - 10:05 10:15 Treatment Length: 112 (minutes) Treatment Segments: 4 Vital Signs Capillary Blood Glucose Reference Range: 80 - 120 mg / dl HBO Diabetic Blood Glucose Intervention Range: <131 mg/dl or >102 mg/dl Type: Time Vitals Blood Pulse: Respiratory Temperature: Capillary Blood Glucose Pulse Action Taken: Pressure: Rate: Glucose (mg/dl): Meter #: Oximetry (%) Taken: Pre 08:09 118/74 66 18 97.8 197 1 none per protocol Post 10:37 112/78 66 18 97.6 88 1 Gave Ensure per protocol; DO released to home Treatment Response Treatment Toleration: Well Treatment Completion Status: Treatment Completed without Adverse Event HBO Attestation I certify that I supervised this HBO  treatment in accordance with Medicare guidelines. A trained emergency response team is readily available per Yes hospital policies and procedures. Continue HBOT as ordered. Yes Electronic Signature(s) Signed: 06/22/2022 4:43:59 PM By: Geralyn Corwin DO Previous Signature: 06/22/2022 1:10:53 PM Version By: Aleda Grana RCP, RRT CHT , , Entered By: Geralyn Corwin on 06/22/2022 14:25:42 [name] ([id number]) [id number] Page 2 of 2 -------------------------------------------------------------------------------- HBO Safety Checklist Details Patient Name: Date of Service: Gene Brown, [name] 06/22/2022 8:00 A M Medical Record Number: [id number] Patient Account Number: [id number] Date of Birth/Sex: Treating RN: 20-Oct-1950 (71 y.o. [name]) Yevonne Pax Primary Care Michaell Grider: Barbette Reichmann Other Clinician: Izetta Dakin Referring Niaya Hickok: Treating Stockton Nunley/Extender: Adolph Pollack Weeks in Treatment: 23 HBO Safety Checklist Items Safety Checklist Consent Form Signed Patient voided / foley secured and emptied When did you last eato 7:00 am Last dose of injectable or oral agent 06/21/22 pm Ostomy pouch emptied and vented if applicable NA All implantable devices assessed, documented and approved NA Intravenous access site secured and place NA Valuables secured Linens and cotton and cotton/polyester blend (less than 51% polyester) Personal oil-based products / skin lotions / body lotions removed Wigs or hairpieces removed NA Smoking or tobacco materials removed NA Books / newspapers / magazines / loose paper removed NA Cologne, aftershave, perfume and deodorant removed Jewelry removed (may wrap wedding band) Make-up removed NA Hair care products removed Battery operated devices (external) removed NA Heating patches and chemical warmers removed NA Titanium eyewear removed NA Nail polish cured greater than 10  hours NA Casting material cured greater than 10 hours NA Hearing aids removed Loose dentures or partials removed NA Prosthetics have been removed NA Patient demonstrates correct use of air break device (if applicable) Patient concerns have been addressed Patient grounding bracelet on and cord attached to  chamber Specifics for Inpatients (complete in addition to above) Medication sheet sent with patient Intravenous medications needed or due during therapy sent with patient Drainage tubes (e.g. nasogastric tube or chest tube secured and vented) Endotracheal or Tracheotomy tube secured Cuff deflated of air and inflated with saline Airway suctioned Electronic Signature(s) Signed: 06/22/2022 1:10:53 PM By: Aleda Grana RCP, RRT CHT , , Entered By: Aleda Grana on 06/22/2022 08:47:04 ,

## 2022-06-24 ENCOUNTER — Encounter: Payer: Medicare Other | Admitting: Physician Assistant

## 2022-06-24 DIAGNOSIS — E11621 Type 2 diabetes mellitus with foot ulcer: Secondary | ICD-10-CM | POA: Diagnosis not present

## 2022-06-24 LAB — GLUCOSE, CAPILLARY
Glucose-Capillary: 216 mg/dL — ABNORMAL HIGH (ref 70–99)
Glucose-Capillary: 128 mg/dL — ABNORMAL HIGH (ref 70–99)

## 2022-06-24 NOTE — Progress Notes (Signed)
LEROI, PILSON ([id number]) [id number] Page 1 of 2 Visit Report for 06/23/2022 HBO Details Patient Name: Date of Service: Gene Brown, Gene Brown UL V. 06/23/2022 8:00 A M Medical Record Number: [id number] Patient Account Number: [id number] Date of Birth/Sex: Treating RN: Dec 27, 1950 (71 y.o. 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 HBO Treatment Details Treatment Number: 36 Patient Type: Outpatient Chamber Type: Monoplace Chamber Serial #: [id number] Treatment Protocol: 2.0 ATA with 90 minutes oxygen, and no air breaks Treatment Details Compression Rate Down: 1.5 psi / minute De-Compression Rate Up: 2.0 psi / minute Air breaks and breathing Decompress Decompress Compress Tx Pressure Begins Reached periods Begins Ends (leave unused spaces blank) Chamber Pressure (ATA 1 2 ------2 1 ) Clock Time (24 hr) 08:23 08:35 - - - - - - 10:06 10:14 Treatment Length: 111 (minutes) Treatment Segments: 4 Vital Signs Capillary Blood Glucose Reference Range: 80 - 120 mg / dl HBO Diabetic Blood Glucose Intervention Range: <131 mg/dl or >249 mg/dl Type: Time Vitals Blood Respiratory Capillary Blood Glucose Pulse Action Pulse: Temperature: Taken: Pressure: Rate: Glucose (mg/dl): Meter #: Oximetry (%) Taken: Pre 08:04 128/72 78 18 97.9 166 1 none per protocol Post 10:40 122/78 66 18 97.6 142 1 none per protocol Treatment Response Treatment Toleration: Well Treatment Completion Status: Treatment Completed without Adverse Event Electronic Signature(s) Signed: 06/23/2022 11:09:11 AM By: Enedina Finner  RCP, RRT CHT , , Signed: 06/24/2022 2:32:55 PM By: Worthy Keeler PA-C Entered By: Enedina Finner on 06/23/2022 11:07:50 , [name] ([id number] Page 2 of 2 -------------------------------------------------------------------------------- HBO Safety Checklist Details Patient Name: Date of Service: Gene Brown, Gene Brown UL V. 06/23/2022 8:00 A M Medical Record Number: [id number] Patient Account Number: [id number] Date of Birth/Sex: Treating RN: [birth year] (71 y.o. [name], [name] Primary Care [name]: Tracie Harrier Other Clinician: Jacqulyn Bath Referring [name]: Treating [name]/Extender: Solon Palm Weeks in Treatment: 23 HBO Safety Checklist Items Safety Checklist Consent Form Signed Patient voided / foley secured and emptied When did you last eato 07:00 am Last dose of injectable or oral agent 06/22/22 pm Ostomy pouch emptied and vented if applicable NA All implantable devices assessed, documented and approved NA Intravenous access site secured and place NA Valuables secured Linens and cotton and cotton/polyester blend (less than 51% polyester) Personal oil-based products / skin lotions / body lotions removed Wigs or hairpieces removed NA Smoking or tobacco materials removed NA Books / newspapers / magazines / loose paper removed NA Cologne, aftershave, perfume and deodorant removed Jewelry removed (may wrap wedding band) Make-up removed NA Hair care products removed Battery operated devices (external) removed NA Heating patches and chemical warmers removed NA Titanium eyewear removed NA Nail polish cured greater than 10 hours NA Casting material cured greater than 10 hours NA Hearing aids removed Loose dentures or partials removed NA Prosthetics have been removed NA Patient demonstrates correct use of air break device (if applicable) Patient concerns have been addressed Patient grounding  bracelet on and cord attached to chamber Specifics for Inpatients (complete in addition to above) Medication sheet sent with patient Intravenous medications needed or due during therapy sent with patient Drainage tubes (e.g. nasogastric tube or chest tube secured and vented) Endotracheal or Tracheotomy  tube secured Cuff deflated of air and inflated with saline Airway suctioned Electronic Signature(s) Signed: 06/23/2022 11:09:11 AM By: Aleda Grana RCP, RRT CHT , , Entered By: Aleda Grana on 06/23/2022 08:35:19 ,

## 2022-06-24 NOTE — Progress Notes (Signed)
MELDON, HANZLIK ([id number]) [id number] Page 1 of 2 Visit Report for 06/24/2022 HBO Details Patient Name: Date of Service: Gene Brown, Gene Brown UL V. 06/24/2022 8:00 A M Medical Record Number: [id number] Patient Account Number: [id number] Date of Birth/Sex: Treating RN: 06-30-1951 (71 y.o. 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 HBO Treatment Details Treatment Number: 37 Patient Type: Outpatient Chamber Type: Monoplace Chamber Serial #: [id number] Treatment Protocol: 2.0 ATA with 90 minutes oxygen, and no air breaks Treatment Details Compression Rate Down: 1.5 psi / minute De-Compression Rate Up: 1.5 psi / minute Air breaks and breathing Decompress Decompress Compress Tx Pressure Begins Reached periods Begins Ends (leave unused spaces blank) Chamber Pressure (ATA 1 2 ------2 1 ) Clock Time (24 hr) 08:16 08:28 - - - - - - 09:59 10:09 Treatment Length: 113 (minutes) Treatment Segments: 4 Vital Signs Capillary Blood Glucose Reference Range: 80 - 120 mg / dl HBO Diabetic Blood Glucose Intervention Range: <131 mg/dl or >626 mg/dl Type: Time Vitals Blood Respiratory Capillary Blood Glucose Pulse Action Pulse: Temperature: Taken: Pressure: Rate: Glucose (mg/dl): Meter #: Oximetry (%) Taken: Pre 08:03 122/72 66 18 97.7 216 1 none per protocol Post 10:34 130/84 66 18 97.7 128 1 none per protocol Treatment Response Treatment Toleration: Well Treatment Completion Status: Treatment Completed without Adverse Event Electronic Signature(s) Signed: 06/24/2022 11:00:42 AM By: Aleda Grana  RCP, RRT CHT , , Signed: 06/24/2022 2:32:55 PM By: Lenda Kelp PA-C Entered By: Aleda Grana on 06/24/2022 10:59:19 , [name] ([id number]) [id number] Page 2 of 2 -------------------------------------------------------------------------------- HBO Safety Checklist Details Patient Name: Date of Service: Gene Brown, Gene Brown UL V. 06/24/2022 8:00 A M Medical Record Number: [id number] Patient Account Number: [id number] Date of Birth/Sex: Treating RN: 01-21-1951 (71 y.o. Gene Brown, Gene Brown Primary Care Dencil Cayson: Barbette Reichmann Other Clinician: Izetta Dakin Referring Annalisia Ingber: Treating Allyssa Abruzzese/Extender: Jearld Shines Weeks in Treatment: 24 HBO Safety Checklist Items Safety Checklist Consent Form Signed Patient voided / foley secured and emptied When did you last eato 07:00 am Last dose of injectable or oral agent 06/23/22 Ostomy pouch emptied and vented if applicable NA All implantable devices assessed, documented and approved NA Intravenous access site secured and place NA Valuables secured Linens and cotton and cotton/polyester blend (less than 51% polyester) Personal oil-based products / skin lotions / body lotions removed Wigs or hairpieces removed NA Smoking or tobacco materials removed NA Books / newspapers / magazines / loose paper removed NA Cologne, aftershave, perfume and deodorant removed Jewelry removed (may wrap wedding band) Make-up removed NA Hair care products removed Battery operated devices (external) removed NA Heating patches and chemical warmers removed NA Titanium eyewear removed NA Nail polish cured greater than 10 hours NA Casting material cured greater than 10 hours NA Hearing aids removed Loose dentures or partials removed NA Prosthetics have been removed NA Patient demonstrates correct use of air break device (if applicable) Patient concerns have been addressed Patient grounding  bracelet on and cord attached to chamber Specifics for Inpatients (complete in addition to above) Medication sheet sent with patient Intravenous medications needed or due during therapy sent with patient Drainage tubes (e.g. nasogastric tube or chest tube secured and vented) Endotracheal or Tracheotomy tube  secured Cuff deflated of air and inflated with saline Airway suctioned Electronic Signature(s) Signed: 06/24/2022 11:00:42 AM By: Aleda Grana RCP, RRT CHT , , Entered By: Aleda Grana on 06/24/2022 08:48:49 ,

## 2022-06-24 NOTE — Progress Notes (Signed)
Gene Brown ([id number]) [id number] Page 1 of 2 Visit Report for 06/24/2022 Arrival Information Details Patient Name: Date of Service: Gene Brown, [name] 06/24/2022 8:00 A M Medical Record Number: [id number] Patient Account Number: [id number] Date of Birth/Sex: Treating RN: 1951-06-13 (71 y.o. Gene Brown, Gene Brown Primary Care Kyandre Okray: Barbette Reichmann Other Clinician: Izetta Dakin Referring Odesser Tourangeau: Treating Jandel Patriarca/Extender: Jearld Shines Weeks in Treatment: 24 Visit Information History Since Last Visit Added or deleted any medications: No Patient Arrived: [name] Any new allergies or adverse reactions: No Arrival Time: 07:50 Had a fall or experienced change in No Accompanied By: self activities of daily living that may affect Transfer Assistance: None risk of falls: Patient Identification Verified: Yes Signs or symptoms of abuse/neglect since last visito No Secondary Verification Process Completed: Yes Hospitalized since last visit: No Patient Requires Transmission-Based Precautions: No Implantable device outside of the clinic excluding No Patient Has Alerts: Yes cellular tissue based products placed in the center Patient Alerts: Patient on Blood Thinner since last visit: 01/07/2022 [place]>220 on left Pain Present Now: No Electronic Signature(s) Signed: 06/24/2022 11:00:42 AM By: Aleda Grana RCP, RRT CHT , , Entered By: Aleda Grana on 06/24/2022 08:46:17 , -------------------------------------------------------------------------------- Encounter Discharge Information Details Patient Name: Date of Service: Gene NS, [name] 06/24/2022 8:00 A M Medical Record Number: [id number] Patient Account Number: [id number] Date of Birth/Sex: Treating RN: 1950-08-12 (71 y.o. Gene Brown Primary Care Aleda Madl: Barbette Reichmann Other Clinician: Izetta Dakin Referring Azel Gumina: Treating Lyanne Kates/Extender: Jearld Shines Weeks in Treatment: 24 Encounter Discharge Information Items Discharge Condition: Stable Ambulatory Status: Cane Discharge Destination: Home Transportation: Private Auto Accompanied By: self Schedule Follow-up Appointment: Yes Clinical Summary of Care: KAMIR, SELOVER ([id number]) [id number] Page 2 of 2 Notes Patient has an HBO treatment scheduled on 06/27/22 at 8:00 am. Electronic Signature(s) Signed: 06/24/2022 11:00:42 AM By: Aleda Grana RCP, RRT CHT , , Entered By: Aleda Grana on 06/24/2022 11:00:24 , -------------------------------------------------------------------------------- Vitals Details Patient Name: Date of Service: Gene NS, [name] 06/24/2022 8:00 A M Medical Record Number: [id number] Patient Account Number: [id number] Date of Birth/Sex: Treating RN: 03-19-1951 (71 y.o. Gene Brown Primary Care Kenyona Rena: Barbette Reichmann Other Clinician: Izetta Dakin Referring Esmae Donathan: Treating Samreet Edenfield/Extender: Jearld Shines Weeks in Treatment: 24 Vital Signs Time Taken: 08:03 Temperature (F): 97.7 Height (in): 68 Pulse (bpm): 66 Weight (lbs): 217 Respiratory Rate (breaths/min): 18 Body Mass Index (BMI): 33 Blood Pressure (mmHg): 122/72 Capillary Blood Glucose (mg/dl): 170 Reference Range: 80 - 120 mg / dl Electronic Signature(s) Signed: 06/24/2022 11:00:42 AM By: Aleda Grana RCP, RRT CHT , , Entered By: Aleda Grana on 06/24/2022 08:47:47 ,

## 2022-06-27 ENCOUNTER — Encounter: Payer: Medicare Other | Admitting: Physician Assistant

## 2022-06-27 DIAGNOSIS — E11621 Type 2 diabetes mellitus with foot ulcer: Secondary | ICD-10-CM | POA: Diagnosis not present

## 2022-06-27 LAB — GLUCOSE, CAPILLARY
Glucose-Capillary: 218 mg/dL — ABNORMAL HIGH (ref 70–99)
Glucose-Capillary: 157 mg/dL — ABNORMAL HIGH (ref 70–99)

## 2022-06-27 NOTE — Progress Notes (Signed)
Gene Brown ([id number]) [id number] Page 1 of 2 Visit Report for 06/27/2022 Arrival Information Details Patient Name: Date of Service: Gene Brown, [name] 06/27/2022 8:00 A M Medical Record Number: [id number] Patient Account Number: [id number] Date of Birth/Sex: Treating RN: [birth year] (71 y.o. Gene Brown) Yevonne Pax Primary Care Lateefa Crosby: Barbette Reichmann Other Clinician: Izetta Dakin Referring Sumiya Mamaril: Treating Haruo Stepanek/Extender: Jearld Shines Weeks in Treatment: 24 Visit Information History Since Last Visit Added or deleted any medications: No Patient Arrived: [name] Any new allergies or adverse reactions: No Arrival Time: 08:09 Had a fall or experienced change in No Accompanied By: self activities of daily living that may affect Transfer Assistance: None risk of falls: Patient Identification Verified: Yes Hospitalized since last visit: No Secondary Verification Process Completed: Yes Implantable device outside of the clinic excluding No Patient Requires Transmission-Based Precautions: No cellular tissue based products placed in the center Patient Has Alerts: Yes since last visit: Patient Alerts: Patient on Blood Thinner Pain Present Now: No 01/07/2022 [place]>220 on left Electronic Signature(s) Signed: 06/27/2022 11:13:20 AM By: Aleda Grana RCP, RRT CHT , , Entered By: Aleda Grana on 06/27/2022 10:16:16 , -------------------------------------------------------------------------------- Encounter Discharge Information Details Patient Name: Date of Service: Gene Brown UL V. 06/27/2022 8:00 A M Medical Record Number: [id number] Patient Account Number: [id number] Date of Birth/Sex: Treating RN: 09/13/1950 (71 y.o. Gene Brown) Yevonne Pax Primary Care Jemal Miskell: Barbette Reichmann Other Clinician: Izetta Dakin Referring Gearl Baratta: Treating [place] Giammona/Extender: Jearld Shines Weeks in Treatment:  24 Encounter Discharge Information Items Discharge Condition: Stable Ambulatory Status: Cane Discharge Destination: Home Transportation: Private Auto Accompanied By: self Schedule Follow-up Appointment: Yes Clinical Summary of Care: Notes Gene Brown ([id number]) [id number] Page 2 of 2 Patient has an HBO treatment scheduled on 06/28/22 at 08:00 am. Electronic Signature(s) Signed: 06/27/2022 11:13:20 AM By: Aleda Grana RCP, RRT CHT , , Entered By: Aleda Grana on 06/27/2022 11:10:55 , -------------------------------------------------------------------------------- Vitals Details Patient Name: Date of Service: Gene Brown UL V. 06/27/2022 8:00 A M Medical Record Number: [id number] Patient Account Number: [id number] Date of Birth/Sex: Treating RN: [birth year] (71 y.o. Gene Brown) Yevonne Pax Primary Care Adalynne Steffensmeier: Barbette Reichmann Other Clinician: Izetta Dakin Referring Shaquana Buel: Treating Damariz Paganelli/Extender: Karle Plumber, Vishwanath Weeks in Treatment: 24 Vital Signs Time Taken: 08:09 Temperature (F): 97.7 Height (in): 68 Pulse (bpm): 72 Weight (lbs): 217 Respiratory Rate (breaths/min): 18 Body Mass Index (BMI): 33 Blood Pressure (mmHg): 130/82 Capillary Blood Glucose (mg/dl): 202 Reference Range: 80 - 120 mg / dl Electronic Signature(s) Signed: 06/27/2022 11:13:20 AM By: Aleda Grana RCP, RRT CHT , , Entered By: Aleda Grana on 06/27/2022 10:16:56 ,

## 2022-06-27 NOTE — Progress Notes (Signed)
Gene Brown ([id number]) [id number] Page 1 of 2 Visit Report for 06/27/2022 HBO Details Patient Name: Date of Service: Gene Brown, [name] 06/27/2022 8:00 A M Medical Record Number: [id number] Patient Account Number: [id number] Date of Birth/Sex: Treating RN: [birth year] (71 y.o. 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 HBO Treatment Details Treatment Number: 38 Patient Type: Outpatient Chamber Type: Monoplace Chamber Serial #: [id number] Treatment Protocol: 2.0 ATA with 90 minutes oxygen, and no air breaks Treatment Details Compression Rate Down: 1.5 psi / minute De-Compression Rate Up: 1.5 psi / minute Air breaks and breathing Decompress Decompress Compress Tx Pressure Begins Reached periods Begins Ends (leave unused spaces blank) Chamber Pressure (ATA 1 2 ------2 1 ) Clock Time (24 hr) 08:30 08:42 - - - - - - 10:12 10:22 Treatment Length: 112 (minutes) Treatment Segments: 4 Vital Signs Capillary Blood Glucose Reference Range: 80 - 120 mg / dl HBO Diabetic Blood Glucose Intervention Range: <131 mg/dl or >782 mg/dl Type: Time Vitals Blood Respiratory Capillary Blood Glucose Pulse Action Pulse: Temperature: Taken: Pressure: Rate: Glucose (mg/dl): Meter #: Oximetry (%) Taken: Pre 08:09 130/82 72 18 97.7 218 1 none per protocol Post 10:44 124/82 72 18 97.8 157 1 none per protocol Treatment Response Treatment Toleration: Well Treatment Completion Status: Treatment Completed without Adverse Event HBO Attestation I certify that I supervised this HBO treatment in accordance with  Medicare guidelines. A trained emergency response team is readily available per Yes hospital policies and procedures. Continue HBOT as ordered. Yes Electronic Signature(s) Signed: 06/27/2022 4:08:46 PM By: Lenda Kelp PA-C Previous Signature: 06/27/2022 11:13:20 AM Version By: Aleda Grana RCP, RRT CHT , , Entered By: Lenda Kelp on 06/27/2022 16:08:46 [name] ([id number]) [id number] Page 2 of 2 -------------------------------------------------------------------------------- HBO Safety Checklist Details Patient Name: Date of Service: Gene Brown, [name] 06/27/2022 8:00 A M Medical Record Number: [id number] Patient Account Number: [id number] Date of Birth/Sex: Treating RN: 03-08-1951 (71 y.o. Gene Brown) Gene Brown Primary Care My Rinke: Barbette Reichmann Other Clinician: Izetta Dakin Referring Carless Slatten: Treating Whitaker Holderman/Extender: Jearld Shines Weeks in Treatment: 24 HBO Safety Checklist Items Safety Checklist Consent Form Signed Patient voided / foley secured and emptied When did you last eato 07:00 am Last dose of injectable or oral agent 06/26/22 pm Ostomy pouch emptied and vented if applicable NA All implantable devices assessed, documented and approved NA Intravenous access site secured and place NA Valuables secured Linens and cotton and cotton/polyester blend (less than 51% polyester) Personal oil-based products / skin lotions / body lotions removed Wigs or hairpieces removed NA Smoking or tobacco materials removed NA Books / newspapers / magazines / loose paper removed NA Cologne, aftershave, perfume and deodorant removed Jewelry removed (may wrap wedding band) Make-up removed NA Hair care products removed Battery operated devices (external) removed NA Heating patches and chemical warmers removed NA Titanium eyewear removed NA Nail polish cured greater than 10 hours NA Casting material cured  greater than 10 hours NA Hearing aids removed Loose dentures or partials removed NA Prosthetics have been removed NA Patient demonstrates correct use of air break device (if applicable) Patient concerns have been addressed Patient grounding bracelet on and cord attached to chamber Specifics for  Inpatients (complete in addition to above) Medication sheet sent with patient Intravenous medications needed or due during therapy sent with patient Drainage tubes (e.g. nasogastric tube or chest tube secured and vented) Endotracheal or Tracheotomy tube secured Cuff deflated of air and inflated with saline Airway suctioned Electronic Signature(s) Signed: 06/27/2022 11:13:20 AM By: Aleda Grana RCP, RRT CHT , , Entered By: Aleda Grana on 06/27/2022 10:20:09 ,

## 2022-06-27 NOTE — Progress Notes (Signed)
ANDONI, BUSCH ([id number]) [id number] Page 1 of 2 Visit Report for 06/27/2022 Problem List Details Patient Name: Date of Service: Gene Brown, [name] 06/27/2022 8:00 A M Medical Record Number: [id number] Patient Account Number: [id number] Date of Birth/Sex: Treating RN: 04-Jul-1951 (71 y.o. 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 06/27/2022 Medical Record Number: [id number] Patient Account Number: [id number] Date of Birth/Sex: Treating RN: August 27, 1950 (71 y.o. 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 I10 Essential (primary) hypertension Facility Procedures : CPT4 Code: [id number] Description: (Facility Use Only) HBOT full body chamber, , Modifier: Quantity: 4 Physician Procedures : CPT4 Code Description Modifier [id number] - WC PHYS HYPERBARIC OXYGEN THERAPY ICD-10 Diagnosis Description M86.672 Other chronic osteomyelitis, left ankle and foot L97.522 Non-pressure chronic ulcer of other part of left foot with fat layer e  E11.621 Type 2 diabetes mellitus with foot ulcer Quantity: 1 xposed Electronic Signature(s) Signed: 06/27/2022 4:08:53 PM By: Lenda Kelp PA-C Previous Signature: 06/27/2022 11:13:20 AM Version By: Aleda Grana RCP, RRT CHT , , Entered By: Lenda Kelp on 06/27/2022 16:08:52

## 2022-06-28 ENCOUNTER — Encounter: Payer: Medicare Other | Admitting: Physician Assistant

## 2022-06-28 DIAGNOSIS — E11621 Type 2 diabetes mellitus with foot ulcer: Secondary | ICD-10-CM | POA: Diagnosis not present

## 2022-06-28 LAB — GLUCOSE, CAPILLARY
Glucose-Capillary: 110 mg/dL — ABNORMAL HIGH (ref 70–99)
Glucose-Capillary: 185 mg/dL — ABNORMAL HIGH (ref 70–99)
Glucose-Capillary: 155 mg/dL — ABNORMAL HIGH (ref 70–99)

## 2022-06-28 NOTE — Progress Notes (Addendum)
EHAN, FREAS ([id number]) [id number] Page 1 of 10 Visit Report for 06/28/2022 Chief Complaint Document Details Patient Name: Date of Service: Gene Brown, Gene Brown UL V. 06/28/2022 10:45 A M Medical Record Number: [id number] Patient Account Number: [id number] Date of Birth/Sex: Treating RN: 1951-06-22 (71 y.o. [name] Primary Care Provider: Barbette Reichmann Other Clinician: Referring Provider: Treating Provider/Extender: Jearld Shines Weeks in Treatment: 24 Information Obtained from: Patient Chief Complaint Left great toe ulcer Electronic Signature(s) Signed: 06/28/2022 10:09:58 AM By: Lenda Kelp Gene Brown-C Entered By: Lenda Kelp on 06/28/2022 10:09:57 -------------------------------------------------------------------------------- HPI Details Patient Name: Date of Service: Gene Heck, Gene Brown UL V. 06/28/2022 10:45 A M Medical Record Number: [id number] Patient Account Number: [id number] Date of Birth/Sex: Treating RN: 08-Aug-1951 (71 y.o. [name] Primary Care Provider: Barbette Reichmann Other Clinician: Referring Provider: Treating Provider/Extender: Jearld Shines Weeks in Treatment: 24 History of Present Illness HPI Description: 05/24/16; this is a 71 year old type II diabetic who probably has some degree of polyneuropathy. He tells me that he has had an area on the plantar aspect of his left great toe that is been followed by podiatry for most the last year. He has a lot of callus, the podiatrist which shave this down put a dressing on and he would follow pad monthly or sometimes longer intervals. He has not noted any drainage or pain. He has not had a recent x-ray. No serious attempt at offloading this with modified footwear.. Last hemoglobin A1c that he knows about was over 10 a month ago he is working hard on this with his primary physician. More recently he has been soaking this with peroxide ABIs are  noncompressible and the left leg. He was told by his podiatrist he has a bone spur underneath the wound but did not wish any surgical procedures. He has a caregiver at home for a very disabled wife, the nature of her illness is not certain 05/31/16 patient comes in much the same as last week amounts of callus nonviable subcutaneous tissue which required debridement. His x-ray suggested cortical bone irregularity. He will need an MRI. He tells me he has old screws/hardware left knee however he thinks he has had a subsequent MRI after this 06/07/16 at this point in time patient has been attempting to offload his wound as much as he could on his own. He has made a conscious effort to avoid placing any pressure on the toe as he was moving around and walking and I do believe this has payed off and what I am seeing at this point in time. overall the wound appears to be somewhat improved in my opinion he is not having any significant discomfort at this point in time. In fact he tells me he has no pain at all. There is no interval sign of surrounding infection in the wound itself has actually decreased in size. Unfortunately the MRI was not ordered last week and therefore we do need to proceed with the MRI order today. again this is to evaluate the cortical bone irregularity noted on x-ray. 06/14/16 Patient has continued to use the offloading shoe at this point in time. He tells me this is somewhat difficult to get used to walking and but nonetheless he is very strict about wearing it. Again I believe this has paid off very well for him and his wound appears to be greatly improved. He does have some LUCIUS, WISE ([id number]) [id number] Page 2 of 10 callused area surrounding the wound  proximally that has caused some fluid collection of maceration of the callused area. This likely will require debridement today. Some adherent slough noted over the wound bed. 06/21/16; patient's  wound on the plantar aspect of his left great toe is totally epithelialized. His MRI of the foot is tomorrow to follow-up on the cortical irregularity noted on plain x-ray. He does not have diabetic foot where 06-28-16-He comes in today with a continued healed left great toe. MRI performed on 06/23/2016 shows no abscess osteomyelitis or septic joint. Patient was discharged from wound care center today. Readmission: 01-07-2022 upon evaluation today patient appears to be doing somewhat poorly in regard to total ulcer on his left foot. Fortunately there does not appear to be any signs of infection which is great news. Has been seen by Dr. Luana Shu and he did request from Dr. Luana Shu to come see [place] due to the fact that in the past several years back we were able to get one of his wounds healed about 4 weeks. Again I explained that that is always variable but nonetheless I definitely think we can help. Fortunately I see no evidence of infection. He does have a foot offloading shoe which I think is good to attempt for now he was able to get this healed before with a peg assist. Patient does have a history of type 2 diabetes, diabetic polyneuropathy, and hypertension. This current wound started on 12-09-2021. 01-20-2022 upon evaluation today patient appears to be doing poorly in regard to his great toe. I am actually very concerned based on what I am seeing here at this point about how things are progressing. I do not believe there is any evidence of infection systemically that is obvious but locally I am concerned about the possibility this could even be an osteomyelitis type situation. 01-28-2022 upon evaluation today patient appears to be doing well with regard to his toe ulcer compared to what we were previous. Fortunately I do not see any signs of active infection at this time which is great news. No fevers, chills, nausea, vomiting, or diarrhea. With that being said the patient has not started his Augmentin yet  which I definitely think he needs to get on board ASAP. I do see some signs of new skin growth I think he is using the front offloading shoe which is good for the time being. 02-04-2022 upon evaluation today patient appears to be doing okay in regard to his toe ulcer this is actually measuring a little bit smaller but still has a bit of callus buildup but not as much as we have seen in the past. Fortunately I do not see any evidence of active infection locally or systemically which is great news. No fevers, chills, nausea, vomiting, or diarrhea. Marland Kitchen 02-14-2022 upon evaluation today patient's wound is actually showing signs of good improvement which is great news. Fortunately I do not see any evidence of active infection locally or systemically which is great news as well. No fevers, chills, nausea, vomiting, or diarrhea. 02-21-2022 upon evaluation today patient's toe ulcer actually is showing signs of excellent improvement. I do not see any evidence of infection locally or systemically which is great news and overall I am extremely pleased with where we stand today. I do not see any signs of active infection locally or systemically which is great news. No fevers, chills, nausea, vomiting, or diarrhea. 7/24; plantar left great toe no real improvement since last week. Using silver alginate gauze. He has a forefoot offloading boot.  He also has lymphedema and uses his compression pumps I do not think this is adding any particular difficulties. It sounds as though he is still pretty active 03-07-2022 upon evaluation today patient appears to be doing excellent in regard to the wound compared to previous pictures. He still has a quite deep wound on the toe good news is the x-ray did not show any signs of osteomyelitis which is excellent. He does need some debridement today. 03-14-2022 upon evaluation today patient appears to be doing well currently in regard to his wound. He has actually been tolerating the dressing  changes without complication. With that being said the dressing seems to either be draining a lot or getting very wet otherwise I think we need to have him change this more frequently. 03-21-2022 upon evaluation today patient appears to be doing well with regard to his toe ulcer I am actually seeing signs of improvement which is great news. This is slow but nonetheless the skin around the edges of the wound are definitely improving as we proceed here. I do not see any evidence of active infection locally or systemically which is great news. 03-29-2022 upon evaluation today patient appears to be doing fairly well except for with regard to his toe being somewhat red. I do think that we may want to place him back on the Augmentin which was previously beneficial for him. He is not in disagreement with this. Otherwise I did perform some debridement of clearway some of the necrotic debris today he seems to be tolerating this without complication. 04-04-2022 upon evaluation today patient unfortunately is continued to have significant issues here with his toe ulcer. He actually has an abscess more proximal on the dorsal aspect of the toe which is new since last week. Subsequently he is on antibiotics already I think we need to proceed with an MRI to further evaluate this. 04-12-2022 upon evaluation today patient appears to be doing better in regard to his toe ulcer. This actually is less red than what it was he is about done with the initial antibiotic he does has his MRI scheduled for Friday. The reason I am going to probably extend the Augmentin for the time being I will to make sure that we keep this under control especially if he potentially has a bone infection I do not going to be going without the antibiotic therapy. Patient is in agreement with that plan. 04-18-2022 upon evaluation today patient does have an MRI for review that was obtained on Sunday. 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 He has been on antibiotics I had him on Augmentin starting initially on January 25, 2022. Continuing to be a problem here. I do not see any signs of infection spreading up his foot but the toe itself still has purulent drainage coming from it. Coupled with this I have had him on the antibiotics, Augmentin, and he is tolerating that quite well. He is actually been on this since 01-20-2022. During that time his wound is getting somewhat better. But is still been very slow which is what prompted the MRI due to the fact that I felt like at a later point he started develop an abscess on the top of his toe that then had me more concerned. At this point I do believe that he is probably can  require hyperbaric oxygen therapy I think that this is the ideal treatment plan for limb salvage coupled with the antibiotic therapy and good wound care. 04-25-2022 upon evaluation today patient appears to be doing okay in regard to his foot ulcers. He actually has an opening on the dorsal aspect of the toe which does not directly connect to the plantar aspect. This is still open in both locations he did have some callus covering that did require sharp debridement today to clear this away. Fortunately there does not appear to be any evidence of active infection at this point. 05-02-2022 upon evaluation today patient appears to be doing well in regard to his wounds. He has been tolerating the dressing changes without complication. With that being said he does have some evidence here of continued drainage. There is evidence of infection as well although I do believe that the medicine is helping to keep this under good control. His erythema is improved although the swelling of the great toe is still present. He has started HBO as of this morning. 05-10-2022 upon evaluation patient's ulcers on his toe  actually are appearing to do much better. I do think he is benefiting from the hyperbarics as well as the antibiotics as well is good wound care. Overall I think that we have a good plan going and the patient is benefiting from this greatly which is also news. I do not see any signs of clinical active infection although obviously were still in the process of treating this osteomyelitis 05-17-2022 upon evaluation today patient's wounds are actually showing signs of good granulation and epithelization there is actually some callus covering To check to see whether this is still open or not. Nonetheless I do believe both wounds are significantly improved compared to previous and there is no erythema or redness-like his toe is doing better and I think he is doing well with the hyperbarics as well as with the oral antibiotics and wound care currently. 05-24-2022 upon evaluation today patient's toe was actually doing excellent. I am very pleased with where things stand and he is making great progress as far as I am concerned. I do not see any evidence of active infection locally or systemically which is great news. No fevers, chills, nausea, vomiting, or diarrhea. Gene BangGRIBBONS, Clayborne V ([id number]) [id number] Page 3 of 10 05-31-2022 upon evaluation today patient appears to be doing excellent in regard to his great toe in fact he actually appears to be completely healed based on what I am seeing. Between the hyperbarics and antibiotics he is really doing quite well along with good wound care and I am very pleased with what we are seeing at this point. He is extremely happy as well. I do think he needs to continue both antibiotics and the hyperbarics to completion to ensure the osteomyelitis completely resolves. 06-07-2022 upon evaluation today patient appears to be doing well currently in regard to his toe ulcers which again do appear to be completely closed. Again were continue with the  hyperbarics to ensure that everything completely resolves and does not return and I will make sure the osteomyelitis is completely treated. He is in agreement with this plan and subsequently were continuing to the full completion of 40 treatments. Nonetheless at this point this is his 30-day evaluation and he seems to be doing quite well everything still is remaining closed. He is now on a flat postop shoe as opposed to the front off loader. We are slowly progressing to get him into  his regular shoe. 06-28-2022 upon evaluation today patient appears to be doing well currently in regard to his original wounds which do appear to be healed. Unfortunately had an area where he hit his toe on the dorsal aspect and a separate area from where this initially was. Subsequently this led to a superficial skin tear nothing deep and it does not appear to be infected today with he has been putting Hydrofera Blue on and I think that is doing a good job. Fortunately I do not see any signs of infection at this time. Electronic Signature(s) Signed: 06/28/2022 3:52:58 PM By: Lenda Kelp Gene Brown-C Entered By: Lenda Kelp on 06/28/2022 15:52:58 -------------------------------------------------------------------------------- Physical Exam Details Patient Name: Date of Service: Gene NS, Gene Brown UL V. 06/28/2022 10:45 A M Medical Record Number: [id number] Patient Account Number: [id number] Date of Birth/Sex: Treating RN: 10-24-1950 (71 y.o. [name] Primary Care Provider: Barbette Reichmann Other Clinician: Betha Loa Referring Provider: Treating Provider/Extender: Jearld Shines Weeks in Treatment: 24 Constitutional Well-nourished and well-hydrated in no acute distress. Respiratory normal breathing without difficulty. Psychiatric this patient is able to make decisions and demonstrates good insight into disease process. Alert and Oriented x 3. pleasant and cooperative. Notes Upon inspection  patient's wound bed actually showed signs of good granulation epithelization at this point. Fortunately there does not appear to be any evidence of infection locally or systemically at this time which is great news and overall I am extremely pleased with where we stand currently. I think this is well on its way to healing quite nicely which is great news. Electronic Signature(s) Signed: 06/28/2022 3:53:41 PM By: Lenda Kelp Gene Brown-C Entered By: Lenda Kelp on 06/28/2022 15:53:41 Physician Orders Details -------------------------------------------------------------------------------- Gene Brown ([id number]) [id number] Page 4 of 10 Patient Name: Date of Service: Gene Brown, Gene Brown UL V. 06/28/2022 10:45 A M Medical Record Number: [id number] Patient Account Number: [id number] Date of Birth/Sex: Treating RN: 1951-08-01 (71 y.o. Gene Brown, [name] Primary Care Provider: Barbette Reichmann Other Clinician: Betha Loa Referring Provider: Treating Provider/Extender: Jearld Shines Weeks in Treatment: 24 Verbal / Phone Orders: No Diagnosis Coding ICD-10 Coding Code Description [phone] Other chronic osteomyelitis, left ankle and foot E11.621 Type 2 diabetes mellitus with foot ulcer 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osed E11.42 Type 2 diabetes mellitus with diabetic polyneuropathy I10 Essential (primary) hypertension Follow-up Appointments Return Appointment in 1 week. Bathing/ Shower/ Hygiene May shower; gently cleanse wound with antibacterial soap, rinse and pat dry prior to dressing wounds No tub bath. Anesthetic (Use 'Patient Medications' Section for Anesthetic Order Entry) Lidocaine applied to wound bed Edema Control - Lymphedema / Segmental Compressive Device / Other Patient to wear own Velcro compression garment. Remove compression stockings every night before going to bed and put on  every morning when getting up. Compression Pump: Use compression pump on left lower extremity for 60 minutes, twice daily. Compression Pump: Use compression pump on right lower extremity for 60 minutes, twice daily. DO YOUR BEST to sleep in the bed at night. DO NOT sleep in your recliner. Long hours of sitting in a recliner leads to swelling of the legs and/or potential wounds on your backside. Off-Loading Open toe surgical shoe Additional Orders / Instructions Follow Nutritious Diet and Increase Protein Intake Hyperbaric Oxygen Therapy Indication and location: - CRO Left great toe 2.0 ATA for 90 Minutes without A Breaks ir One treatment per day (delivered Monday through Friday unless otherwise  specified in Special Instructions below): Total # of Treatments: - 40 Antihistamine 30 minutes prior to HBO Treatment, difficulty clearing ears. Finger stick Blood Glucose Pre- and Post- HBOT Treatment. Follow Hyperbaric Oxygen Glycemia Protocol Wound Treatment Wound #4 - T Great oe Wound Laterality: Dorsal, Left Cleanser: Normal Saline Discharge Instructions: Wash your hands with soap and water. Remove old dressing, discard into plastic bag and place into trash. Cleanse the wound with Normal Saline prior to applying a clean dressing using gauze sponges, not tissues or cotton balls. Do not scrub or use excessive force. Pat dry using gauze sponges, not tissue or cotton balls. Cleanser: Soap and Water Discharge Instructions: Gently cleanse wound with antibacterial soap, rinse and pat dry prior to dressing wounds Prim Dressing: Hydrofera Blue Ready Transfer Foam, 2.5x2.5 (in/in) ary Discharge Instructions: Apply Hydrofera Blue Ready to wound bed as directed Secondary Dressing: Gauze Discharge Instructions: As directed: dry, moistened with saline or moistened with Dakins Solution GLYCEMIA INTERVENTIONS PROTOCOL PRE-HBO GLYCEMIA INTERVENTIONS ACTION INTERVENTION Obtain pre-HBO capillary blood  glucose (ensure 1 physician order is in chart). A. Notify HBO physician and await physician orders. 2 If result is 70 mg/dl or below: B. If the result meets the hospital definition of a critical result, follow hospital policy. SAMWISE, ECKARDT ([id number]) [id number] Page 5 of 10 A. Give patient an 8 ounce Glucerna Shake, an 8 ounce Ensure, or 8 ounces of a Glucerna/Ensure equivalent dietary supplement*. B. Wait 30 minutes. If result is 71 mg/dl to 409 mg/dl: C. Retest patients capillary blood glucose (CBG). D. If result greater than or equal to 110 mg/dl, proceed with HBO. If result less than 110 mg/dl, notify HBO physician and consider holding HBO. If result is 131 mg/dl to 811 mg/dl: A. Proceed with HBO. A. Notify HBO physician and await physician orders. B. It is recommended to hold HBO and do If result is 250 mg/dl or greater: blood/urine ketone testing. C. If the result meets the hospital definition of a critical result, follow hospital policy. POST-HBO GLYCEMIA INTERVENTIONS ACTION INTERVENTION Obtain post HBO capillary blood glucose (ensure 1 physician order is in chart). A. Notify HBO physician and await physician orders. 2 If result is 70 mg/dl or below: B. If the result meets the hospital definition of a critical result, follow hospital policy. A. Give patient an 8 ounce Glucerna Shake, an 8 ounce Ensure, or 8 ounces of a Glucerna/Ensure equivalent dietary supplement*. B. Wait 15 minutes for symptoms of If result is 71 mg/dl to 914 mg/dl: hypoglycemia (i.e. nervousness, anxiety, sweating, chills, clamminess, irritability, confusion, tachycardia or dizziness). C. If patient asymptomatic, discharge patient. If patient symptomatic, repeat capillary blood glucose (CBG) and notify HBO physician. If result is 101 mg/dl to 782 mg/dl: A. Discharge patient. A. Notify HBO physician and await physician orders. B. It is recommended  to do blood/urine ketone If result is 250 mg/dl or greater: testing. C. If the result meets the hospital definition of a critical result, follow hospital policy. *Juice or candies are NOT equivalent products. If patient refuses the Glucerna or Ensure, please consult the hospital dietitian for an appropriate substitute. Electronic Signature(s) Signed: 06/28/2022 5:00:42 PM By: Betha Loa Signed: 06/28/2022 5:41:33 PM By: Lenda Kelp Gene Brown-C Entered By: Betha Loa on 06/28/2022 12:01:32 Prescription 06/28/2022 -------------------------------------------------------------------------------- Louann Sjogren Gene Brown-C Patient Name: Provider: 04-Sep-1950 [id number] Date of Birth: NPI#: [name] [id number] Sex: DEA #: [phone] Phone #: License #: [facility] Patient Address: [address] [place]  [facility] [place], [place] [address] [address], [address] [place], [place] [address] [phone] Allergies Statins-Hmg-Coa Reductase Inhibitors CHANSE, KAGEL ([id number]) [id number] Page 6 of 10 Provider's Orders Patient to wear own Velcro compression garment. Remove compression stockings every night before going to bed and put on every morning when getting up. Hand Signature: Date(s): Electronic Signature(s) Signed: 06/28/2022 5:00:42 PM By: Betha Loa Signed: 06/28/2022 5:41:33 PM By: Lenda Kelp Gene Brown-C Entered By: Betha Loa on 06/28/2022 12:01:34 -------------------------------------------------------------------------------- Problem List Details Patient Name: Date of Service: Gene Heck, Gene Brown UL V. 06/28/2022 10:45 A M Medical Record Number: [id number] Patient Account Number: [id number] Date of Birth/Sex: Treating RN: 1951/04/10 (71 y.o. [name] Primary Care Provider: Barbette Reichmann Other Clinician: Referring Provider: Treating Provider/Extender: Jearld Shines Weeks in Treatment: 24 Active Problems ICD-10 Encounter Code Description Active Date MDM Diagnosis [phone] Other chronic osteomyelitis, left ankle and foot 04/25/2022 No Yes E11.621 Type 2 diabetes mellitus with foot ulcer 01/07/2022 No Yes 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osed 01/07/2022 No Yes E11.42 Type 2 diabetes mellitus with diabetic polyneuropathy 01/07/2022 No Yes I10 Essential (primary) hypertension 01/07/2022 No Yes Inactive Problems Resolved Problems Electronic Signature(s) Signed: 06/28/2022 10:09:53 AM By: Lenda Kelp Gene Brown-C Entered By: Lenda Kelp on 06/28/2022 10:09:52 Gene Brown ([id number]) [id number] Page 7 of 10 -------------------------------------------------------------------------------- Progress Note Details Patient Name: Date of Service: Gene Brown, Gene Brown UL V. 06/28/2022 10:45 A M Medical Record Number: [id number] Patient Account Number: [id number] Date of Birth/Sex: Treating RN: 1950-11-24 (71 y.o. [name] Primary Care Provider: Barbette Reichmann Other Clinician: Betha Loa Referring Provider: Treating Provider/Extender: Jearld Shines Weeks in Treatment: 24 Subjective Chief Complaint Information obtained from Patient Left great toe ulcer History of Present Illness (HPI) 05/24/16; this is a 72 year old type II diabetic who probably has some degree of polyneuropathy. He tells me that he has had an area on the plantar aspect of his left great toe that is been followed by podiatry for most the last year. He has a lot of callus, the podiatrist which shave this down put a dressing on and he would follow pad monthly or sometimes longer intervals. He has not noted any drainage or pain. He has not had a recent x-ray. No serious attempt at offloading this with modified footwear.. Last hemoglobin A1c that he knows about was over 10 a month ago he is  working hard on this with his primary physician. More recently he has been soaking this with peroxide ABIs are noncompressible and the left leg. He was told by his podiatrist he has a bone spur underneath the wound but did not wish any surgical procedures. He has a caregiver at home for a very disabled wife, the nature of her illness is not certain 05/31/16 patient comes in much the same as last week amounts of callus nonviable subcutaneous tissue which required debridement. His x-ray suggested cortical bone irregularity. He will need an MRI. He tells me he has old screws/hardware left knee however he thinks he has had a subsequent MRI after this 06/07/16 at this point in time patient has been attempting to offload his wound as much as he could on his own. He has made a conscious effort to avoid placing any pressure on the toe as he was moving around and walking and I do believe this has payed off and what I am seeing at this point in time. overall the wound  appears to be somewhat improved in my opinion he is not having any significant discomfort at this point in time. In fact he tells me he has no pain at all. There is no interval sign of surrounding infection in the wound itself has actually decreased in size. Unfortunately the MRI was not ordered last week and therefore we do need to proceed with the MRI order today. again this is to evaluate the cortical bone irregularity noted on x-ray. 06/14/16 Patient has continued to use the offloading shoe at this point in time. He tells me this is somewhat difficult to get used to walking and but nonetheless he is very strict about wearing it. Again I believe this has paid off very well for him and his wound appears to be greatly improved. He does have some callused area surrounding the wound proximally that has caused some fluid collection of maceration of the callused area. This likely will require debridement today. Some adherent slough noted over the wound  bed. 06/21/16; patient's wound on the plantar aspect of his left great toe is totally epithelialized. His MRI of the foot is tomorrow to follow-up on the cortical irregularity noted on plain x-ray. He does not have diabetic foot where 06-28-16-He comes in today with a continued healed left great toe. MRI performed on 06/23/2016 shows no abscess osteomyelitis or septic joint. Patient was discharged from wound care center today. Readmission: 01-07-2022 upon evaluation today patient appears to be doing somewhat poorly in regard to total ulcer on his left foot. Fortunately there does not appear to be any signs of infection which is great news. Has been seen by Dr. Excell Seltzer and he did request from Dr. Excell Seltzer to come see [place] due to the fact that in the past several years back we were able to get one of his wounds healed about 4 weeks. Again I explained that that is always variable but nonetheless I definitely think we can help. Fortunately I see no evidence of infection. He does have a foot offloading shoe which I think is good to attempt for now he was able to get this healed before with a peg assist. Patient does have a history of type 2 diabetes, diabetic polyneuropathy, and hypertension. This current wound started on 12-09-2021. 01-20-2022 upon evaluation today patient appears to be doing poorly in regard to his great toe. I am actually very concerned based on what I am seeing here at this point about how things are progressing. I do not believe there is any evidence of infection systemically that is obvious but locally I am concerned about the possibility this could even be an osteomyelitis type situation. 01-28-2022 upon evaluation today patient appears to be doing well with regard to his toe ulcer compared to what we were previous. Fortunately I do not see any signs of active infection at this time which is great news. No fevers, chills, nausea, vomiting, or diarrhea. With that being said the patient has not  started his Augmentin yet which I definitely think he needs to get on board ASAP. I do see some signs of new skin growth I think he is using the front offloading shoe which is good for the time being. 02-04-2022 upon evaluation today patient appears to be doing okay in regard to his toe ulcer this is actually measuring a little bit smaller but still has a bit of callus buildup but not as much as we have seen in the past. Fortunately I do not see any evidence of  active infection locally or systemically which is great news. No fevers, chills, nausea, vomiting, or diarrhea. Marland Kitchen 02-14-2022 upon evaluation today patient's wound is actually showing signs of good improvement which is great news. Fortunately I do not see any evidence of active infection locally or systemically which is great news as well. No fevers, chills, nausea, vomiting, or diarrhea. 02-21-2022 upon evaluation today patient's toe ulcer actually is showing signs of excellent improvement. I do not see any evidence of infection locally or systemically which is great news and overall I am extremely pleased with where we stand today. I do not see any signs of active infection locally or systemically which is great news. No fevers, chills, nausea, vomiting, or diarrhea. 7/24; plantar left great toe no real improvement since last week. Using silver alginate gauze. He has a forefoot offloading boot. He also has lymphedema and uses his compression pumps I do not think this is adding any particular difficulties. It sounds as though he is still pretty active 03-07-2022 upon evaluation today patient appears to be doing excellent in regard to the wound compared to previous pictures. He still has a quite deep wound on DORN, HARTSHORNE ([id number]) [id number] Page 8 of 10 the toe good news is the x-ray did not show any signs of osteomyelitis which is excellent. He does need some debridement today. 03-14-2022 upon evaluation today  patient appears to be doing well currently in regard to his wound. He has actually been tolerating the dressing changes without complication. With that being said the dressing seems to either be draining a lot or getting very wet otherwise I think we need to have him change this more frequently. 03-21-2022 upon evaluation today patient appears to be doing well with regard to his toe ulcer I am actually seeing signs of improvement which is great news. This is slow but nonetheless the skin around the edges of the wound are definitely improving as we proceed here. I do not see any evidence of active infection locally or systemically which is great news. 03-29-2022 upon evaluation today patient appears to be doing fairly well except for with regard to his toe being somewhat red. I do think that we may want to place him back on the Augmentin which was previously beneficial for him. He is not in disagreement with this. Otherwise I did perform some debridement of clearway some of the necrotic debris today he seems to be tolerating this without complication. 04-04-2022 upon evaluation today patient unfortunately is continued to have significant issues here with his toe ulcer. He actually has an abscess more proximal on the dorsal aspect of the toe which is new since last week. Subsequently he is on antibiotics already I think we need to proceed with an MRI to further evaluate this. 04-12-2022 upon evaluation today patient appears to be doing better in regard to his toe ulcer. This actually is less red than what it was he is about done with the initial antibiotic he does has his MRI scheduled for Friday. The reason I am going to probably extend the Augmentin for the time being I will to make sure that we keep this under control especially if he potentially has a bone infection I do not going to be going without the antibiotic therapy. Patient is in agreement with that plan. 04-18-2022 upon evaluation today patient  does have an MRI for review that was obtained on Sunday. He I am going to go through this with him today it did show that  he has a septic joint and the interphalangeal joint as well as proximal and distal phalanges osteomyelitis of the great toe. He has been on antibiotics I had him on Augmentin starting initially on January 25, 2022. Continuing to be a problem here. I do not see any signs of infection spreading up his foot but the toe itself still has purulent drainage coming from it. Coupled with this I have had him on the antibiotics, Augmentin, and he is tolerating that quite well. He is actually been on this since 01-20-2022. During that time his wound is getting somewhat better. But is still been very slow which is what prompted the MRI due to the fact that I felt like at a later point he started develop an abscess on the top of his toe that then had me more concerned. At this point I do believe that he is probably can require hyperbaric oxygen therapy I think that this is the ideal treatment plan for limb salvage coupled with the antibiotic therapy and good wound care. 04-25-2022 upon evaluation today patient appears to be doing okay in regard to his foot ulcers. He actually has an opening on the dorsal aspect of the toe which does not directly connect to the plantar aspect. This is still open in both locations he did have some callus covering that did require sharp debridement today to clear this away. Fortunately there does not appear to be any evidence of active infection at this point. 05-02-2022 upon evaluation today patient appears to be doing well in regard to his wounds. He has been tolerating the dressing changes without complication. With that being said he does have some evidence here of continued drainage. There is evidence of infection as well although I do believe that the medicine is helping to keep this under good control. His erythema is improved although the swelling of the great toe  is still present. He has started HBO as of this morning. 05-10-2022 upon evaluation patient's ulcers on his toe actually are appearing to do much better. I do think he is benefiting from the hyperbarics as well as the antibiotics as well is good wound care. Overall I think that we have a good plan going and the patient is benefiting from this greatly which is also news. I do not see any signs of clinical active infection although obviously were still in the process of treating this osteomyelitis 05-17-2022 upon evaluation today patient's wounds are actually showing signs of good granulation and epithelization there is actually some callus covering To check to see whether this is still open or not. Nonetheless I do believe both wounds are significantly improved compared to previous and there is no erythema or redness-like his toe is doing better and I think he is doing well with the hyperbarics as well as with the oral antibiotics and wound care currently. 05-24-2022 upon evaluation today patient's toe was actually doing excellent. I am very pleased with where things stand and he is making great progress as far as I am concerned. I do not see any evidence of active infection locally or systemically which is great news. No fevers, chills, nausea, vomiting, or diarrhea. 05-31-2022 upon evaluation today patient appears to be doing excellent in regard to his great toe in fact he actually appears to be completely healed based on what I am seeing. Between the hyperbarics and antibiotics he is really doing quite well along with good wound care and I am very pleased with what we are seeing  at this point. He is extremely happy as well. I do think he needs to continue both antibiotics and the hyperbarics to completion to ensure the osteomyelitis completely resolves. 06-07-2022 upon evaluation today patient appears to be doing well currently in regard to his toe ulcers which again do appear to be completely closed.  Again were continue with the hyperbarics to ensure that everything completely resolves and does not return and I will make sure the osteomyelitis is completely treated. He is in agreement with this plan and subsequently were continuing to the full completion of 40 treatments. Nonetheless at this point this is his 30-day evaluation and he seems to be doing quite well everything still is remaining closed. He is now on a flat postop shoe as opposed to the front off loader. We are slowly progressing to get him into his regular shoe. 06-28-2022 upon evaluation today patient appears to be doing well currently in regard to his original wounds which do appear to be healed. Unfortunately had an area where he hit his toe on the dorsal aspect and a separate area from where this initially was. Subsequently this led to a superficial skin tear nothing deep and it does not appear to be infected today with he has been putting Hydrofera Blue on and I think that is doing a good job. Fortunately I do not see any signs of infection at this time. Objective Constitutional Well-nourished and well-hydrated in no acute distress. Vitals Time Taken: 11:47 AM, Height: 68 in, Weight: 217 lbs, BMI: 33, Temperature: 97.7 F, Pulse: 60 bpm, Respiratory Rate: 18 breaths/min, Blood Pressure: 124/84 mmHg. Respiratory normal breathing without difficulty. Psychiatric this patient is able to make decisions and demonstrates good insight into disease process. Alert and Oriented x 3. pleasant and cooperative. RAHKEEM, SENFT ([id number]) [id number] Page 9 of 10 General Notes: Upon inspection patient's wound bed actually showed signs of good granulation epithelization at this point. Fortunately there does not appear to be any evidence of infection locally or systemically at this time which is great news and overall I am extremely pleased with where we stand currently. I think this is well on its way to  healing quite nicely which is great news. Integumentary (Hair, Skin) Wound #4 status is Open. Original cause of wound was Trauma. The date acquired was: 06/25/2022. The wound is located on the Left,Dorsal T Great. The oe wound measures 1cm length x 2cm width x 0.1cm depth; 1.571cm^2 area and 0.157cm^3 volume. There is no tunneling or undermining noted. There is a small amount of serosanguineous drainage noted. There is no granulation within the wound bed. There is no necrotic tissue within the wound bed. Assessment Active Problems ICD-10 Other chronic osteomyelitis, left ankle and foot Type 2 diabetes mellitus with foot ulcer Non-pressure chronic ulcer of other part of left foot with fat layer exposed Type 2 diabetes mellitus with diabetic polyneuropathy Essential (primary) hypertension Plan Follow-up Appointments: Return Appointment in 1 week. Bathing/ Shower/ Hygiene: May shower; gently cleanse wound with antibacterial soap, rinse and pat dry prior to dressing wounds No tub bath. Anesthetic (Use 'Patient Medications' Section for Anesthetic Order Entry): Lidocaine applied to wound bed Edema Control - Lymphedema / Segmental Compressive Device / Other: Patient to wear own Velcro compression garment. Remove compression stockings every night before going to bed and put on every morning when getting up. Compression Pump: Use compression pump on left lower extremity for 60 minutes, twice daily. Compression Pump: Use compression pump on right lower extremity for  60 minutes, twice daily. DO YOUR BEST to sleep in the bed at night. DO NOT sleep in your recliner. Long hours of sitting in a recliner leads to swelling of the legs and/or potential wounds on your backside. Off-Loading: Open toe surgical shoe Additional Orders / Instructions: Follow Nutritious Diet and Increase Protein Intake Hyperbaric Oxygen Therapy: Indication and location: - CRO Left great toe 2.0 ATA for 90 Minutes without  Air Breaks One treatment per day (delivered Monday through Friday unless otherwise specified in Special Instructions below): T # of Treatments: - 40 otal Antihistamine 30 minutes prior to HBO Treatment, difficulty clearing ears. Finger stick Blood Glucose Pre- and Post- HBOT Treatment. Follow Hyperbaric Oxygen Glycemia Protocol WOUND #4: - T Great Wound Laterality: Dorsal, Left oe Cleanser: Normal Saline Discharge Instructions: Wash your hands with soap and water. Remove old dressing, discard into plastic bag and place into trash. Cleanse the wound with Normal Saline prior to applying a clean dressing using gauze sponges, not tissues or cotton balls. Do not scrub or use excessive force. Pat dry using gauze sponges, not tissue or cotton balls. Cleanser: Soap and Water Discharge Instructions: Gently cleanse wound with antibacterial soap, rinse and pat dry prior to dressing wounds Prim Dressing: Hydrofera Blue Ready Transfer Foam, 2.5x2.5 (in/in) ary Discharge Instructions: Apply Hydrofera Blue Ready to wound bed as directed Secondary Dressing: Gauze Discharge Instructions: As directed: dry, moistened with saline or moistened with Dakins Solution 1. I am going to suggest that we have the patient continue to monitor for any signs of worsening infection. If anything changes he knows to contact the office to let me know. 2. Also can recommend that the patient should continue to utilize the [facility] specifically and he should also continue to monitor for any signs of red streaks or erythema surrounding the wound Re[place]auer not seeing anything and I think this is probably going to heal quite nicely. We will see patient back for reevaluation in 1 week here in the clinic. If anything worsens or changes patient will contact our office for additional recommendations. Electronic Signature(s) Signed: 06/28/2022 3:54:16 PM By: Lenda Kelp Gene Brown-C Entered By: Lenda Kelp on 06/28/2022  15:54:16 Gene Brown ([id number]) [id number] Page 10 of 10 -------------------------------------------------------------------------------- SuperBill Details Patient Name: Date of Service: Gene Brown, Gene Brown UL V. 06/28/2022 Medical Record Number: [id number] Patient Account Number: [id number] Date of Birth/Sex: Treating RN: 11-Feb-1951 (71 y.o. Gene Brown, [name] Primary Care Provider: Barbette Reichmann Other Clinician: Betha Loa Referring Provider: Treating Provider/Extender: Jearld Shines Weeks in Treatment: 24 Diagnosis Coding ICD-10 Codes Code Description [phone] Other chronic osteomyelitis, left ankle and foot E11.621 Type 2 diabetes mellitus with foot ulcer 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osed E11.42 Type 2 diabetes mellitus with diabetic polyneuropathy I10 Essential (primary) hypertension Facility Procedures : CPT4 Code: [id number] Description: 99213 - WOUND CARE VISIT-LEV 3 EST PT Modifier: Quantity: 1 Physician Procedures : CPT4 Code Description Modifier [id number] - WC PHYS LEVEL 3 - EST PT ICD-10 Diagnosis Description M86.672 Other chronic osteomyelitis, left ankle and foot E11.621 Type 2 diabetes mellitus with foot ulcer L97.522 Non-pressure chronic ulcer of other  part of left foot with fat layer exposed E11.42 Type 2 diabetes mellitus with diabetic polyneuropathy Quantity: 1 Electronic Signature(s) Signed: 06/28/2022 3:56:42 PM By: Lenda Kelp Gene Brown-C Entered By: Lenda Kelp on 06/28/2022 15:56:40

## 2022-06-28 NOTE — Progress Notes (Signed)
Gene, Brown ([id number]) [id number] Page 1 of 2 Visit Report for 06/28/2022 HBO Details Patient Name: Date of Service: Gene Brown, [name] 06/28/2022 8:00 A M Medical Record Number: [id number] Patient Account Number: [id number] Date of Birth/Sex: Treating RN: September 15, 1950 (71 y.o. 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 HBO Treatment Details Treatment Number: 39 Patient Type: Outpatient Chamber Type: Monoplace Chamber Serial #: [id number] Treatment Protocol: 2.0 ATA with 90 minutes oxygen, and no air breaks Treatment Details Compression Rate Down: 1.5 psi / minute De-Compression Rate Up: 1.5 psi / minute Air breaks and breathing Decompress Decompress Compress Tx Pressure Begins Reached periods Begins Ends (leave unused spaces blank) Chamber Pressure (ATA 1 2 ------2 1 ) Clock Time (24 hr) 08:27 08:40 - - - - - - 10:10 10:19 Treatment Length: 112 (minutes) Treatment Segments: 4 Vital Signs Capillary Blood Glucose Reference Range: 80 - 120 mg / dl HBO Diabetic Blood Glucose Intervention Range: <131 mg/dl or >026 mg/dl Type: Time Vitals Blood Pulse: Respiratory Capillary Blood Glucose Pulse Action Temperature: Taken: Pressure: Rate: Glucose (mg/dl): Meter #: Oximetry (%) Taken: Pre 08:04 125/72 72 18 97.8 110 1 Gave Ensure per protocol Post 10:40 24/84 60 18 97.7 155 1 none per protocol Pre 08:23 185 1 Proceed with HBO Treatment Response Treatment Toleration: Well Treatment Completion Status: Treatment Completed without Adverse Event Electronic Signature(s) Signed: 06/28/2022  2:11:08 PM By: Gene Brown RCP, RRT CHT , , Signed: 06/28/2022 5:41:33 PM By: Gene Kelp PA-C Entered By: Gene Brown on 06/28/2022 14:10:44 , Gene Brown ([id number]) [id number] Page 2 of 2 -------------------------------------------------------------------------------- HBO Safety Checklist Details Patient Name: Date of Service: Gene Brown, [name] 06/28/2022 8:00 A M Medical Record Number: [id number] Patient Account Number: [id number] Date of Birth/Sex: Treating RN: 1950/09/25 (71 y.o. Gene Brown, Gene Batten Primary Care Gene Brown: Gene Brown Other Clinician: Izetta Brown Referring Gene Brown: Treating Gene Brown/Extender: Gene Brown Weeks in Treatment: 24 HBO Safety Checklist Items Safety Checklist Consent Form Signed Patient voided / foley secured and emptied When did you last eato 07:00 am Last dose of injectable or oral agent 06/27/22 Ostomy pouch emptied and vented if applicable NA All implantable devices assessed, documented and approved NA Intravenous access site secured and place NA Valuables secured Linens and cotton and cotton/polyester blend (less than 51% polyester) Personal oil-based products / skin lotions / body lotions removed Wigs or hairpieces removed NA Smoking or tobacco materials removed NA Books / newspapers / magazines / loose paper removed NA Cologne, aftershave, perfume and deodorant removed Jewelry removed (may wrap wedding band) Make-up removed NA Hair care products removed Battery operated devices (external) removed NA Heating patches and chemical warmers removed NA Titanium eyewear removed NA Nail polish cured greater than 10 hours NA Casting material cured greater than 10 hours NA Hearing aids removed Loose dentures or partials removed NA Prosthetics have been removed NA Patient demonstrates correct use of air break device (if applicable) Patient concerns  have been addressed Patient grounding bracelet on and cord attached to chamber Specifics for Inpatients (complete in addition to above) Medication sheet sent with patient Intravenous medications needed or due during therapy sent with patient Drainage tubes (e.g. nasogastric tube or chest  tube secured and vented) Endotracheal or Tracheotomy tube secured Cuff deflated of air and inflated with saline Airway suctioned Electronic Signature(s) Signed: 06/28/2022 2:11:08 PM By: Enedina Finner RCP, RRT CHT , , Entered By: Enedina Finner on 06/28/2022 09:08:31 ,

## 2022-06-28 NOTE — Progress Notes (Addendum)
BRADLEY, BOSTELMAN ([id number]) [id number] Page 1 of 10 Visit Report for 06/28/2022 Arrival Information Details Patient Name: Date of Service: Gene Brown, Gene Brown UL Brown. 06/28/2022 10:45 A M Medical Record Number: [id number] Patient Account Number: [id number] Date of Birth/Sex: Treating RN: 12-Jun-1951 (71 y.o. Gene Brown Primary Care Amarien Carne: Barbette Reichmann Other Clinician: Betha Loa Referring Lorane Cousar: Treating Treena Cosman/Extender: Jearld Shines Weeks in Treatment: 24 Visit Information History Since Last Visit All ordered tests and consults were completed: No Patient Arrived: [name] Added or deleted any medications: No Arrival Time: 11:45 Any new allergies or adverse reactions: No Transfer Assistance: None Had a fall or experienced change in No Patient Identification Verified: Yes activities of daily living that may affect Secondary Verification Process Completed: Yes risk of falls: Patient Requires Transmission-Based Precautions: No Signs or symptoms of abuse/neglect since No Patient Has Alerts: Yes last visito Patient Alerts: Patient on Blood Thinner Hospitalized since last visit: No 01/07/2022 [place]>220 on left Implantable device outside of the clinic No excluding cellular tissue based products placed in the center since last visit: Has Dressing in Place as Prescribed: Yes Has Footwear/Offloading in Place as Yes Prescribed: Left: Surgical Shoe with Pressure Relief Insole Pain Present Now: No Electronic Signature(s) Signed: 06/28/2022 5:00:42 PM By: Betha Loa Entered By: Betha Loa on 06/28/2022 11:45:58 -------------------------------------------------------------------------------- Clinic Level of Care Assessment Details Patient Name: Date of Service: Gene Brown, Gene Brown UL Brown. 06/28/2022 10:45 A M Medical Record Number: [id number] Patient Account Number: [id number] Date of Birth/Sex: Treating RN: 1951-07-29 (71 y.o. Gene Brown Primary Care Jeanpaul Biehl: Barbette Reichmann Other Clinician: Betha Loa Referring Nyara Capell: Treating Ilda Laskin/Extender: Jearld Shines Weeks in Treatment: 24 Clinic Level of Care Assessment Items TOOL 4 Quantity Score RANDALL, COLDEN ([id number]) [id number] Page 2 of 10 []  - 0 Use when only an EandM is performed on FOLLOW-UP visit ASSESSMENTS - Nursing Assessment / Reassessment X- 1 10 Reassessment of Co-morbidities (includes updates in patient status) X- 1 5 Reassessment of Adherence to Treatment Plan ASSESSMENTS - Wound and Skin A ssessment / Reassessment X - Simple Wound Assessment / Reassessment - one wound 1 5 []  - 0 Complex Wound Assessment / Reassessment - multiple wounds []  - 0 Dermatologic / Skin Assessment (not related to wound area) ASSESSMENTS - Focused Assessment []  - 0 Circumferential Edema Measurements - multi extremities []  - 0 Nutritional Assessment / Counseling / Intervention []  - 0 Lower Extremity Assessment (monofilament, tuning fork, pulses) []  - 0 Peripheral Arterial Disease Assessment (using hand held doppler) ASSESSMENTS - Ostomy and/or Continence Assessment and Care []  - 0 Incontinence Assessment and Management []  - 0 Ostomy Care Assessment and Management (repouching, etc.) PROCESS - Coordination of Care X - Simple Patient / Family Education for ongoing care 1 15 []  - 0 Complex (extensive) Patient / Family Education for ongoing care []  - 0 Staff obtains , Records, T Results / Process Orders est []  - 0 Staff telephones HHA, Nursing Homes / Clarify orders / etc []  - 0 Routine Transfer to another Facility (non-emergent condition) []  - 0 Routine Hospital Admission (non-emergent condition) []  - 0 New Admissions / / Ordering NPWT Apligraf, etc. , []  - 0 Emergency Hospital Admission (emergent condition) X- 1 10 Simple Discharge Coordination []  - 0 Complex  (extensive) Discharge Coordination PROCESS - Special Needs []  - 0 Pediatric / Minor Patient Management []  - 0 Isolation Patient Management []  - 0 Hearing / Language / Visual special needs []  - 0 Assessment of  Community assistance (transportation, D/C planning, etc.) []  - 0 Additional assistance / Altered mentation []  - 0 Support Surface(s) Assessment (bed, cushion, seat, etc.) INTERVENTIONS - Wound Cleansing / Measurement X - Simple Wound Cleansing - one wound 1 5 []  - 0 Complex Wound Cleansing - multiple wounds X- 1 5 Wound Imaging (photographs - any number of wounds) []  - 0 Wound Tracing (instead of photographs) X- 1 5 Simple Wound Measurement - one wound []  - 0 Complex Wound Measurement - multiple wounds INTERVENTIONS - Wound Dressings []  - 0 Small Wound Dressing one or multiple wounds X- 1 15 Medium Wound Dressing one or multiple wounds []  - 0 Large Wound Dressing one or multiple wounds Gene Brown, Gene Brown ( ) [id number] Page 3 of 10 []  - 0 Application of Medications - topical []  - 0 Application of Medications - injection INTERVENTIONS - Miscellaneous []  - 0 External ear exam []  - 0 Specimen Collection (cultures, biopsies, blood, body fluids, etc.) []  - 0 Specimen(s) / Culture(s) sent or taken to Lab for analysis []  - 0 Patient Transfer (multiple staff / Lift / Similar devices) []  - 0 Simple Staple / Suture removal (25 or less) []  - 0 Complex Staple / Suture removal (26 or more) []  - 0 Hypo / Hyperglycemic Management (close monitor of Blood Glucose) []  - 0 Ankle / Brachial Index (ABI) - do not check if billed separately X- 1 5 Vital Signs Has the patient been seen at the hospital within the last three years: Yes Total Score: 80 Level Of Care: New/Established - Level 3 Electronic Signature(s) Signed: 06/28/2022 5:00:42 PM By: Entered By: on 06/28/2022  12:01:58 -------------------------------------------------------------------------------- Encounter Discharge Information Details Patient Name: Date of Service: [id number], Gene Brown UL Brown. 06/28/2022 10:45 A M Medical Record Number: Patient Account Number: Date of Birth/Sex: Treating RN: 02-May-1951 (71 y.o. Primary Care Salar Molden: Other Clinician: Michiel Sites Referring Bristol Soy: Treating Khyler Urda/Extender: Weeks in Treatment: 24 Encounter Discharge Information Items Discharge Condition: Stable Ambulatory Status: Cane Discharge Destination: Home Transportation: Private Auto Accompanied By: self Schedule Follow-up Appointment: Yes Clinical Summary of Care: Electronic Signature(s) Signed: 06/28/2022 5:00:42 PM By: Entered By: 06/30/2022 on 06/28/2022 12:05:20 Betha Loa (06/30/2022) [id number] Page 4 of 10 -------------------------------------------------------------------------------- Lower Extremity Assessment Details Patient Name: Date of Service: 06/30/2022, [id number] UL Brown. 06/28/2022 10:45 A M Medical Record Number: 12/08/1950 Patient Account Number: [id number] Date of Birth/Sex: Treating RN: 01-08-1951 (71 y.o. Betha Loa Primary Care Khiara Shuping: Jearld Shines Other Clinician: 25 Referring Jerett Odonohue: Treating Tommie Bohlken/Extender: 06/30/2022 Weeks in Treatment: 24 Edema Assessment Assessed: [Left: Yes] [Right: No] Edema: [Left: Ye] [Right: s] Calf Left: Right: Point of Measurement: 38 cm From Medial Instep 41 cm Ankle Left: Right: Point of Measurement: 11 cm From Medial Instep 28.1 cm Vascular Assessment Pulses: Dorsalis Pedis Palpable: [Left:Yes] Electronic Signature(s) Signed: 06/28/2022 5:00:42 PM By: 06/30/2022 Signed: 06/29/2022 5:38:08 PM By: [id number], BSN, RN, CWS, Kim RN, BSN Entered By: 09-01-1994 on  06/28/2022 11:59:43 -------------------------------------------------------------------------------- Multi Wound Chart Details Patient Name: Date of Service: [place], Gene Brown UL Brown. 06/28/2022 10:45 A M Medical Record Number: [id number] Patient Account Number: [id number] Date of Birth/Sex: Treating RN: [birth year] (71 y.o. Gene Brown Primary Care Alister Staver: Barbette Reichmann Other Clinician: Betha Loa Referring Gerritt Galentine: Treating Kasai Beltran/Extender: Jearld Shines Weeks in Treatment: 24 Vital Signs Height(in): 68 Pulse(bpm): 60 Weight(lbs): 217 Blood Pressure(mmHg): 124/84  Body Mass Index(BMI): 33 Temperature(F): 97.7 Respiratory Rate(breaths/min): [address] ([id number]) [id number] Page 5 of 10 [4:Photos:] [N/A:N/A] Left, Dorsal T Great oe N/A N/A Wound Location: Trauma N/A N/A Wounding Event: Abrasion N/A N/A Primary Etiology: Cataracts, Sickle Cell Disease, Sleep N/A N/A Comorbid History: Apnea, Hypertension, Type II Diabetes, Gout, Osteoarthritis 06/25/2022 N/A N/A Date Acquired: 0 N/A N/A Weeks of Treatment: Open N/A N/A Wound Status: No N/A N/A Wound Recurrence: 1x2x0.1 N/A N/A Measurements L x W x D (cm) 1.571 N/A N/A A (cm) : rea 0.157 N/A N/A Volume (cm) : Partial Thickness N/A N/A Classification: Small N/A N/A Exudate A mount: Serosanguineous N/A N/A Exudate Type: red, brown N/A N/A Exudate Color: None Present (0%) N/A N/A Granulation A mount: None Present (0%) N/A N/A Necrotic A mount: Fascia: No N/A N/A Exposed Structures: Fat Layer (Subcutaneous Tissue): No Tendon: No Muscle: No Joint: No Bone: No None N/A N/A Epithelialization: Treatment Notes Electronic Signature(s) Signed: 06/28/2022 5:00:42 PM By: Betha Loa Entered By: Betha Loa on 06/28/2022 12:00:04 -------------------------------------------------------------------------------- Multi-Disciplinary Care Plan  Details Patient Name: Date of Service: Gene Heck, Gene Brown UL Brown. 06/28/2022 10:45 A M Medical Record Number: [id number] Patient Account Number: [id number] Date of Birth/Sex: Treating RN: [birth year] (71 y.o. Gene Brown Primary Care Emaline Karnes: Barbette Reichmann Other Clinician: Betha Loa Referring Guelda Batson: Treating Sadat Sliwa/Extender: Jearld Shines Weeks in Treatment: 24 Active Inactive HBO Nursing Diagnoses: Anxiety related to feelings of confinement associated with the hyperbaric oxygen chamber Gene Brown, Gene Brown ([id number]) [phone].pdf Page 6 of 10 Anxiety related to knowledge deficit of hyperbaric oxygen therapy and treatment procedures Discomfort related to temperature and humidity changes inside hyperbaric chamber Potential for barotraumas to ears, sinuses, teeth, and lungs or cerebral gas embolism related to changes in atmospheric pressure inside hyperbaric oxygen chamber Potential for oxygen toxicity seizures related to delivery of 100% oxygen at an increased atmospheric pressure Potential for pulmonary oxygen toxicity related to delivery of 100% oxygen at an increased atmospheric pressure Goals: Barotrauma will be prevented during HBO2 Date Initiated: 05/10/2022 T arget Resolution Date: 05/02/2022 Goal Status: Active Patient and/or family will be able to state/discuss factors appropriate to the management of their disease process during treatment Date Initiated: 05/10/2022 T arget Resolution Date: 05/02/2022 Goal Status: Active Patient will tolerate the hyperbaric oxygen therapy treatment Date Initiated: 05/10/2022 T arget Resolution Date: 05/02/2022 Goal Status: Active Patient will tolerate the internal climate of the chamber Date Initiated: 05/10/2022 T arget Resolution Date: 05/02/2022 Goal Status: Active Patient/caregiver will verbalize understanding of HBO goals, rationale, procedures and potential hazards Date Initiated: 05/10/2022 T  arget Resolution Date: 05/02/2022 Goal Status: Active Signs and symptoms of pulmonary oxygen toxicity will be recognized and promptly addressed Date Initiated: 05/10/2022 T arget Resolution Date: 05/02/2022 Goal Status: Active Signs and symptoms of seizure will be recognized and promptly addressed ; seizing patients will suffer no harm Date Initiated: 05/10/2022 T arget Resolution Date: 05/02/2022 Goal Status: Active Interventions: Administer a five (5) minute air break for patient if signs and symptoms of seizure appear and notify the hyperbaric physician Administer decongestants, per physician orders, prior to HBO2 Administer the correct therapeutic gas delivery based on the patients needs and limitations, per physician order Assess and provide for patients comfort related to the hyperbaric environment and equalization of middle ear Assess for signs and symptoms related to adverse events, including but not limited to confinement anxiety, pneumothorax, oxygen toxicity and baurotrauma Assess patient for any history of confinement anxiety Assess patient's knowledge and  expectations regarding hyperbaric medicine and provide education related to the hyperbaric environment, goals of treatment and prevention of adverse events Implement protocols to decrease risk of pneumothorax in high risk patients Notes: Peripheral Neuropathy Nursing Diagnoses: Knowledge deficit related to disease process and management of peripheral neurovascular dysfunction Potential alteration in peripheral tissue perfusion (select prior to confirmation of diagnosis) Goals: Patient/caregiver will verbalize understanding of disease process and disease management Date Initiated: 01/07/2022 Target Resolution Date: 01/07/2022 Goal Status: Active Interventions: Assess signs and symptoms of neuropathy upon admission and as needed Provide education on Management of Neuropathy and Related Ulcers Provide education on Management of  Neuropathy upon discharge from the Wound Center Notes: Electronic Signature(s) Signed: 06/28/2022 5:00:42 PM By: Betha LoaVenable, Angie Signed: 06/29/2022 5:38:08 PM By: Elliot GurneyWoody, BSN, RN, CWS, Kim RN, BSN Entered By: Betha LoaVenable, Angie on 06/28/2022 11:59:49 [name], Gene Brown ([id number]) [id number] Page 7 of 10 -------------------------------------------------------------------------------- Pain Assessment Details Patient Name: Date of Service: Gene HeckGRIBBO Brown, [place]PA UL Brown. 06/28/2022 10:45 A M Medical Record Number: [id number] Patient Account Number: [id number] Date of Birth/Sex: Treating RN: 05/27/1951 (71 y.o. Gene HolmsM) Woody, Kim Primary Care Arleene Settle: Barbette ReichmannHande, Vishwanath Other Clinician: Betha LoaVenable, Angie Referring Tashonna Descoteaux: Treating Jadore Mcguffin/Extender: Jearld ShinesStone, Hoyt Hande, Vishwanath Weeks in Treatment: 24 Active Problems Location of Pain Severity and Description of Pain Patient Has Paino No Site Locations Pain Management and Medication Current Pain Management: Electronic Signature(s) Signed: 06/28/2022 5:00:42 PM By: Betha LoaVenable, Angie Signed: 06/29/2022 5:38:08 PM By: Elliot GurneyWoody, BSN, RN, CWS, Kim RN, BSN Entered By: Betha LoaVenable, Angie on 06/28/2022 11:47:56 -------------------------------------------------------------------------------- Patient/Caregiver Education Details Patient Name: Date of Service: Gene HeckGRIBBO NS, Gene Brown UL Brown. 11/21/2023andnbsp10:45 A M Medical Record Number: [id number] Patient Account Number: [id number] Date of Birth/Gender: Treating RN: 03/30/1951 (71 y.o. Gene HolmsM) Woody, Kim Primary Care Physician: Barbette ReichmannHande, Vishwanath Other Clinician: Betha LoaVenable, Angie Referring Physician: Treating Physician/Extender: Jearld ShinesStone, Hoyt Hande, Vishwanath Weeks in Treatment: [address] Pisani, Khani Brown ([id number]) [id number] Page 8 of 10 Education Assessment Education Provided To: Patient Education Topics Provided Wound/Skin Impairment: Handouts: Other: continue wound care as  directed Methods: Explain/Verbal Responses: State content correctly Electronic Signature(s) Signed: 06/28/2022 5:00:42 PM By: Betha LoaVenable, Angie Entered By: Betha LoaVenable, Angie on 06/28/2022 12:04:45 -------------------------------------------------------------------------------- Wound Assessment Details Patient Name: Date of Service: Gene HeckGRIBBO NS, Gene Brown UL Brown. 06/28/2022 10:45 A M Medical Record Number: [id number] Patient Account Number: [id number] Date of Birth/Sex: Treating RN: 12/25/1950 (71 y.o. Gene Brown) Woody, Selena BattenKim Primary Care Kumari Sculley: Barbette ReichmannHande, Vishwanath Other Clinician: Betha LoaVenable, Angie Referring Michaela Broski: Treating Gene Brown/Extender: Jearld ShinesStone, Hoyt Hande, Vishwanath Weeks in Treatment: 24 Wound Status Wound Number: 4 Primary Abrasion Etiology: Wound Location: Left, Dorsal T Great oe Wound Open Wounding Event: Trauma Status: Date Acquired: 06/25/2022 Comorbid Cataracts, Sickle Cell Disease, Sleep Apnea, Hypertension, Type Weeks Of Treatment: 0 History: II Diabetes, Gout, Osteoarthritis Clustered Wound: No Photos Wound Measurements Length: (cm) 1 Width: (cm) 2 Depth: (cm) 0.1 Area: (cm) 1.571 Volume: (cm) 0.157 % Reduction in Area: % Reduction in Volume: Epithelialization: None Tunneling: No Undermining: No Wound Description Classification: Partial Thickness Exudate Amount: Small Exudate Type: Serosanguineous Gene Brown, Gene Brown ([id number]) Exudate Color: red, brown Foul Odor After Cleansing: No Slough/Fibrino No [id number] Page 9 of 10 Wound Bed Granulation Amount: None Present (0%) Exposed Structure Necrotic Amount: None Present (0%) Fascia Exposed: No Fat Layer (Subcutaneous Tissue) Exposed: No Tendon Exposed: No Muscle Exposed: No Joint Exposed: No Bone Exposed: No Treatment Notes Wound #4 (Toe Great) Wound Laterality: Dorsal, Left Cleanser Normal Saline Discharge Instruction: Wash your hands with soap and water. Remove old dressing, discard into  plastic bag  and place into trash. Cleanse the wound with Normal Saline prior to applying a clean dressing using gauze sponges, not tissues or cotton balls. Do not scrub or use excessive force. Pat dry using gauze sponges, not tissue or cotton balls. Soap and Water Discharge Instruction: Gently cleanse wound with antibacterial soap, rinse and pat dry prior to dressing wounds Peri-Wound Care Topical Primary Dressing Hydrofera Blue Ready Transfer Foam, 2.5x2.5 (in/in) Discharge Instruction: Apply Hydrofera Blue Ready to wound bed as directed Secondary Dressing Gauze Discharge Instruction: As directed: dry, moistened with saline or moistened with Dakins Solution Secured With Compression Wrap Compression Stockings Add-Ons Electronic Signature(s) Signed: 06/28/2022 5:00:42 PM By: Betha Loa Signed: 06/29/2022 5:38:08 PM By: Elliot Gurney, BSN, RN, CWS, Kim RN, BSN Entered By: Betha Loa on 06/28/2022 11:59:27 -------------------------------------------------------------------------------- Vitals Details Patient Name: Date of Service: Gene Heck, Gene Brown UL Brown. 06/28/2022 10:45 A M Medical Record Number: [id number] Patient Account Number: [id number] Date of Birth/Sex: Treating RN: April 06, 1951 (71 y.o. Gene Brown Primary Care Ramonia Mcclaran: Barbette Reichmann Other Clinician: Betha Loa Referring Sonnia Strong: Treating Wilfrido Luedke/Extender: Jearld Shines Weeks in Treatment: 24 Vital Signs Time Taken: 11:47 Temperature (F): 97.7 Height (in): 68 Pulse (bpm): 60 Weight (lbs): 217 Respiratory Rate (breaths/min): 18 Body Mass Index (BMI): 33 Blood Pressure (mmHg): 124/84 WEAVER, TWEED ([id number]) [id number] Page 10 of 10 Reference Range: 80 - 120 mg / dl Electronic Signature(s) Signed: 06/28/2022 5:00:42 PM By: Betha Loa Entered By: Betha Loa on 06/28/2022 11:47:52

## 2022-06-28 NOTE — Progress Notes (Signed)
AYCE, PIETRZYK ([id number]) [id number] Page 1 of 2 Visit Report for 06/28/2022 Arrival Information Details Patient Name: Date of Service: Gene Brown, Gene Brown UL V. 06/28/2022 8:00 A M Medical Record Number: [id number] Patient Account Number: [id number] Date of Birth/Sex: Treating RN: 1950/08/14 (71 y.o. Gene Brown, Gene Batten Primary Care Gene Brown: Gene Brown Other Clinician: Izetta Brown Referring Gene Brown: Treating Gene Brown/Extender: Gene Brown Weeks in Treatment: 24 Visit Information History Since Last Visit Added or deleted any medications: No Patient Arrived: Gene Brown Any new allergies or adverse reactions: No Arrival Time: 08:00 Had a fall or experienced change in No Accompanied By: self activities of daily living that may affect Transfer Assistance: None risk of falls: Patient Identification Verified: Yes Signs or symptoms of abuse/neglect since No Secondary Verification Process Completed: Yes last visito Patient Requires Transmission-Based No Hospitalized since last visit: No Precautions: Implantable device outside of the clinic No Patient Has Alerts: Yes excluding Patient Alerts: Patient on Blood Thinner cellular tissue based products placed in the 01/07/2022 [place]>220 on left center since last visit: Has Dressing in Place as Prescribed: Yes Has Footwear/Offloading in Place as Yes Prescribed: Left: Surgical Shoe with Pressure Relief Insole Pain Present Now: No Electronic Signature(s) Signed: 06/28/2022 2:11:08 PM By: Gene Brown RCP, RRT CHT , , Entered By: Gene Brown on 06/28/2022 09:05:27 , -------------------------------------------------------------------------------- Encounter Discharge Information Details Patient Name: Date of Service: Gene Brown, Gene UL V. 06/28/2022 8:00 A M Medical Record Number: [id number] Patient Account Number: [id number] Date of Birth/Sex: Treating RN: 1951-03-22 (71 y.o. Gene Brown Primary Care Gene Brown: Gene Brown Other Clinician: Izetta Brown Referring Gene Brown: Treating Gene Brown/Extender: Gene Brown Weeks in Treatment: 24 Encounter Discharge Information Items Discharge Condition: Stable Ambulatory Status: [facility] Discharge Destination: Home Gene Brown, Gene Brown ([id number]) [id number] Page 2 of 2 Transportation: Private Auto Accompanied By: self Schedule Follow-up Appointment: Yes Clinical Summary of Care: Notes Patient has an HBO treatment scheduled on 06/29/22 at 08:00 am. Electronic Signature(s) Signed: 06/28/2022 2:11:08 PM By: Gene Brown RCP, RRT CHT , , Entered By: Gene Brown on 06/28/2022 14:05:45 , -------------------------------------------------------------------------------- Vitals Details Patient Name: Date of Service: Gene Brown, Gene UL V. 06/28/2022 8:00 A M Medical Record Number: [id number] Patient Account Number: [id number] Date of Birth/Sex: Treating RN: Apr 13, 1951 (71 y.o. Gene Brown Primary Care Gene Brown: Gene Brown Other Clinician: Izetta Brown Referring Gene Brown: Treating Gene Brown/Extender: Gene Brown Weeks in Treatment: 24 Vital Signs Time Taken: 08:04 Temperature (F): 97.8 Height (in): 68 Pulse (bpm): 72 Weight (lbs): 217 Respiratory Rate (breaths/min): 18 Body Mass Index (BMI): 33 Blood Pressure (mmHg): 125/72 Capillary Blood Glucose (mg/dl): 185 Reference Range: 80 - 120 mg / dl Electronic Signature(s) Signed: 06/28/2022 2:11:08 PM By: Gene Brown RCP, RRT CHT , , Entered By: Gene Brown on 06/28/2022 09:07:05 ,

## 2022-06-29 ENCOUNTER — Encounter (HOSPITAL_BASED_OUTPATIENT_CLINIC_OR_DEPARTMENT_OTHER): Payer: Medicare Other | Admitting: Internal Medicine

## 2022-06-29 DIAGNOSIS — L97522 Non-pressure chronic ulcer of other part of left foot with fat layer exposed: Secondary | ICD-10-CM | POA: Diagnosis not present

## 2022-06-29 DIAGNOSIS — M86672 Other chronic osteomyelitis, left ankle and foot: Secondary | ICD-10-CM

## 2022-06-29 DIAGNOSIS — E11621 Type 2 diabetes mellitus with foot ulcer: Secondary | ICD-10-CM | POA: Diagnosis not present

## 2022-06-29 LAB — GLUCOSE, CAPILLARY
Glucose-Capillary: 182 mg/dL — ABNORMAL HIGH (ref 70–99)
Glucose-Capillary: 132 mg/dL — ABNORMAL HIGH (ref 70–99)

## 2022-06-29 NOTE — Progress Notes (Signed)
KALVEN, GANIM ([id number]) [id number] Page 1 of 2 Visit Report for 06/29/2022 Arrival Information Details Patient Name: Date of Service: Gene Brown, Gene Brown UL V. 06/29/2022 8:00 A M Medical Record Number: [id number] Patient Account Number: [id number] Date of Birth/Sex: Treating RN: 05-Jul-1951 (71 y.o. [name]) Yevonne Pax Primary Care Linzy Darling: Barbette Reichmann Other Clinician: Izetta Dakin Referring Maelynn Moroney: Treating Kayhan Boardley/Extender: Adolph Pollack Weeks in Treatment: 24 Visit Information History Since Last Visit Added or deleted any medications: No Patient Arrived: [name] Any new allergies or adverse reactions: No Arrival Time: 07:58 Had a fall or experienced change in No Accompanied By: self activities of daily living that may affect Transfer Assistance: None risk of falls: Patient Identification Verified: Yes Signs or symptoms of abuse/neglect since No Secondary Verification Process Completed: Yes last visito Patient Requires Transmission-Based No Hospitalized since last visit: No Precautions: Implantable device outside of the clinic No Patient Has Alerts: Yes excluding Patient Alerts: Patient on Blood Thinner cellular tissue based products placed in the 01/07/2022 [place]>220 on left center since last visit: Has Dressing in Place as Prescribed: Yes Has Footwear/Offloading in Place as Yes Prescribed: Left: Surgical Shoe with Pressure Relief Insole Pain Present Now: No Electronic Signature(s) Signed: 06/29/2022 10:50:52 AM By: Aleda Grana RCP, RRT CHT , , Entered By: Aleda Grana on 06/29/2022 08:00:41 , -------------------------------------------------------------------------------- Encounter Discharge Information Details Patient Name: Date of Service: Gene Heck, [name] 06/29/2022 8:00 A M Medical Record Number: [id number] Patient Account Number: [id number] Date of Birth/Sex: Treating RN: 05-08-1951 (71  y.o. [name]) Yevonne Pax Primary Care Eddie Payette: Barbette Reichmann Other Clinician: Izetta Dakin Referring Ethelwyn Gilbertson: Treating Neythan Kozlov/Extender: Adolph Pollack Weeks in Treatment: 24 Encounter Discharge Information Items Discharge Condition: Stable Ambulatory Status: [facility] Discharge Destination: [place] ([id number]) [id number] Page 2 of 2 Transportation: Private Auto Accompanied By: self Schedule Follow-up Appointment: No Clinical Summary of Care: Notes Patient has no follow up HBO treatment as he completed his prescribed 40 treatment course today. Electronic Signature(s) Signed: 06/29/2022 10:50:52 AM By: Aleda Grana RCP, RRT CHT , , Entered By: Aleda Grana on 06/29/2022 10:49:40 , -------------------------------------------------------------------------------- Vitals Details Patient Name: Date of Service: Gene Heck, [name] 06/29/2022 8:00 A M Medical Record Number: [id number] Patient Account Number: [id number] Date of Birth/Sex: Treating RN: 1950/11/02 (71 y.o. [name]) Yevonne Pax Primary Care Elienai Gailey: Barbette Reichmann Other Clinician: Izetta Dakin Referring Gracilyn Gunia: Treating Eilyn Polack/Extender: Adolph Pollack Weeks in Treatment: 24 Vital Signs Time Taken: 08:08 Temperature (F): 97.8 Height (in): 68 Pulse (bpm): 72 Weight (lbs): 217 Respiratory Rate (breaths/min): 18 Body Mass Index (BMI): 33 Blood Pressure (mmHg): 118/64 Capillary Blood Glucose (mg/dl): 962 Reference Range: 80 - 120 mg / dl Electronic Signature(s) Signed: 06/29/2022 10:50:52 AM By: Aleda Grana RCP, RRT CHT , , Entered By: Aleda Grana on 06/29/2022 08:29:43 ,

## 2022-06-29 NOTE — Progress Notes (Addendum)
TREI, SCHOCH ([id number]) [id number] Page 1 of 2 Visit Report for 06/29/2022 HBO Details Patient Name: Date of Service: Gene Brown, [name] 06/29/2022 8:00 A M Medical Record Number: [id number] Patient Account Number: [id number] Date of Birth/Sex: Treating RN: 09/03/1950 (71 y.o. 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 HBO Treatment 06/29/2022 End Date: HBO Discharge Treatment Series Complete; Wound Progress Plateaued / Outcome: Significant Improvement HBO Treatment Details Treatment Number: 40 Patient Type: Outpatient Chamber Type: Monoplace Chamber Serial #: [id number] Treatment Protocol: 2.0 ATA with 90 minutes oxygen, and no air breaks Treatment Details Compression Rate Down: 1.5 psi / minute De-Compression Rate Up: 1.5 psi / minute Air breaks and breathing Decompress Decompress Compress Tx Pressure Begins Reached periods Begins Ends (leave unused spaces blank) Chamber Pressure (ATA 1 2 ------2 1 ) Clock Time (24 hr) 08:26 08:38 - - - - - - 10:08 10:18 Treatment Length: 112 (minutes) Treatment Segments: 4 Vital Signs Capillary Blood Glucose Reference Range: 80 - 120 mg / dl HBO Diabetic Blood Glucose Intervention Range: <131 mg/dl or >124 mg/dl Type: Time Vitals Blood Respiratory Capillary Blood Glucose Pulse Action Pulse: Temperature: Taken: Pressure: Rate: Glucose (mg/dl): Meter #: Oximetry (%) Taken: Pre 08:08 118/64 72 18 97.8 182 1 none per protocol Post 10:24 120/70 72 18 97.6 132 1 none per protocol Treatment Response Treatment Toleration: Well Treatment  Completion Status: Treatment Completed without Adverse Event HBO Attestation I certify that I supervised this HBO treatment in accordance with Medicare guidelines. A trained emergency response team is readily available per Yes hospital policies and procedures. Continue HBOT as ordered. Yes Electronic Signature(s) Signed: 06/29/2022 2:19:49 PM By: Corinna Capra ([id number]) PM By: Geralyn Corwin DO [id number] Page 2 of 2 Signed: 06/29/2022 2:19:49 Previous Signature: 06/29/2022 10:50:52 AM Version By: Aleda Grana RCP, RRT CHT , , Previous Signature: 06/29/2022 12:14:43 PM Version By: Geralyn Corwin DO Entered By: Geralyn Corwin on 06/29/2022 12:15:09 -------------------------------------------------------------------------------- HBO Safety Checklist Details Patient Name: Date of Service: Gene Heck, [name] 06/29/2022 8:00 A M Medical Record Number: [id number] Patient Account Number: [id number] Date of Birth/Sex: Treating RN: 1951/04/11 (71 y.o. [name]) Yevonne Pax Primary Care Cherye Gaertner: Barbette Reichmann Other Clinician: Izetta Dakin Referring Ezechiel [place]: Treating Amonie Wisser/Extender: Adolph Pollack Weeks in Treatment: 24 HBO Safety Checklist Items Safety Checklist Consent Form Signed Patient voided / foley secured and emptied When did you last eato 07:00 am Last dose of injectable or oral agent 06/28/22 pm Ostomy pouch emptied and vented if applicable NA All implantable devices assessed, documented and approved NA Intravenous access site secured and place NA Valuables secured Linens and cotton and cotton/polyester blend (less than 51% polyester) Personal oil-based products / skin lotions / body lotions removed Wigs or hairpieces removed NA Smoking or tobacco materials removed NA Books / newspapers / magazines / loose paper removed NA Cologne, aftershave, perfume and deodorant removed Jewelry  removed (may wrap wedding band) Make-up removed NA Hair care products removed Battery operated devices (external) removed NA Heating patches and chemical warmers removed NA Titanium eyewear removed NA Nail polish cured greater than 10 hours NA Casting material cured greater than 10 hours NA Hearing aids removed Loose dentures or partials  removed NA Prosthetics have been removed NA Patient demonstrates correct use of air break device (if applicable) Patient concerns have been addressed Patient grounding bracelet on and cord attached to chamber Specifics for Inpatients (complete in addition to above) Medication sheet sent with patient Intravenous medications needed or due during therapy sent with patient Drainage tubes (e.g. nasogastric tube or chest tube secured and vented) Endotracheal or Tracheotomy tube secured Cuff deflated of air and inflated with saline Airway suctioned Electronic Signature(s) Signed: 06/29/2022 10:50:52 AM By: Enedina Finner RCP, RRT CHT , , Entered By: Enedina Finner on 06/29/2022 09:28:36 ,

## 2022-07-05 ENCOUNTER — Ambulatory Visit: Payer: Medicare Other | Admitting: Physician Assistant

## 2022-07-12 ENCOUNTER — Encounter: Payer: Medicare Other | Attending: Physician Assistant | Admitting: Physician Assistant

## 2022-07-12 DIAGNOSIS — E1169 Type 2 diabetes mellitus with other specified complication: Secondary | ICD-10-CM | POA: Insufficient documentation

## 2022-07-12 DIAGNOSIS — I10 Essential (primary) hypertension: Secondary | ICD-10-CM | POA: Diagnosis not present

## 2022-07-12 DIAGNOSIS — E11621 Type 2 diabetes mellitus with foot ulcer: Secondary | ICD-10-CM | POA: Diagnosis not present

## 2022-07-12 DIAGNOSIS — L97522 Non-pressure chronic ulcer of other part of left foot with fat layer exposed: Secondary | ICD-10-CM | POA: Diagnosis not present

## 2022-07-12 DIAGNOSIS — M86672 Other chronic osteomyelitis, left ankle and foot: Secondary | ICD-10-CM | POA: Diagnosis not present

## 2022-07-12 NOTE — Progress Notes (Addendum)
JANES, COLEGROVE ([id number]) [id number] Page 1 of 9 Visit Report for 07/12/2022 Chief Complaint Document Details Patient Name: Date of Service: Gene Brown, [name] 07/12/2022 9:45 A M Medical Record Number: [id number] Patient Account Number: [id number] Date of Birth/Sex: Treating RN: 05-Feb-1951 (71 y.o. [name] Primary Care Provider: Barbette Reichmann Other Clinician: Betha Loa Referring Provider: Treating Provider/Extender: Jearld Shines Weeks in Treatment: 26 Information Obtained from: Patient Chief Complaint Left great toe ulcer Electronic Signature(s) Signed: 07/12/2022 9:59:19 AM By: Lenda Kelp PA-C Entered By: Lenda Kelp on 07/12/2022 09:59:19 -------------------------------------------------------------------------------- HPI Details Patient Name: Date of Service: Gene Heck, [name] 07/12/2022 9:45 A M Medical Record Number: [id number] Patient Account Number: [id number] Date of Birth/Sex: Treating RN: Mar 18, 1951 (71 y.o. [name] Primary Care Provider: Barbette Reichmann Other Clinician: Betha Loa Referring Provider: Treating Provider/Extender: Jearld Shines Weeks in Treatment: 26 History of Present Illness HPI Description: 05/24/16; this is a 71 year old type II diabetic who probably has some degree of polyneuropathy. He tells me that he has had an area on the plantar aspect of his left great toe that is been followed by podiatry for most the last year. He has a lot of callus, the podiatrist which shave this down put a dressing on and he would follow pad monthly or sometimes longer intervals. He has not noted any drainage or pain. He has not had a recent x-ray. No serious attempt at offloading this with modified footwear.. Last hemoglobin A1c that he knows about was over 10 a month ago he is working hard on this with his primary physician. More recently he has been soaking this with  peroxide ABIs are noncompressible and the left leg. He was told by his podiatrist he has a bone spur underneath the wound but did not wish any surgical procedures. He has a caregiver at home for a very disabled wife, the nature of her illness is not certain 05/31/16 patient comes in much the same as last week amounts of callus nonviable subcutaneous tissue which required debridement. His x-ray suggested cortical bone irregularity. He will need an MRI. He tells me he has old screws/hardware left knee however he thinks he has had a subsequent MRI after this 06/07/16 at this point in time patient has been attempting to offload his wound as much as he could on his own. He has made a conscious effort to avoid placing any pressure on the toe as he was moving around and walking and I do believe this has payed off and what I am seeing at this point in time. overall the wound appears to be somewhat improved in my opinion he is not having any significant discomfort at this point in time. In fact he tells me he has no pain at all. There is no interval sign of surrounding infection in the wound itself has actually decreased in size. Unfortunately the MRI was not ordered last week and therefore we do need to proceed with the MRI order today. again this is to evaluate the cortical bone irregularity noted on x-ray. 06/14/16 Patient has continued to use the offloading shoe at this point in time. He tells me this is somewhat difficult to get used to walking and but nonetheless he is very strict about wearing it. Again I believe this has paid off very well for him and his wound appears to be greatly improved. He does have some LOWELL, MAKARA ([id number]) [id number] Page 2 of 9 callused  area surrounding the wound proximally that has caused some fluid collection of maceration of the callused area. This likely will require debridement today. Some adherent slough noted over the wound  bed. 06/21/16; patient's wound on the plantar aspect of his left great toe is totally epithelialized. His MRI of the foot is tomorrow to follow-up on the cortical irregularity noted on plain x-ray. He does not have diabetic foot where 06-28-16-He comes in today with a continued healed left great toe. MRI performed on 06/23/2016 shows no abscess osteomyelitis or septic joint. Patient was discharged from wound care center today. Readmission: 01-07-2022 upon evaluation today patient appears to be doing somewhat poorly in regard to total ulcer on his left foot. Fortunately there does not appear to be any signs of infection which is great news. Has been seen by Dr. Excell Seltzer and he did request from Dr. Excell Seltzer to come see [place] due to the fact that in the past several years back we were able to get one of his wounds healed about 4 weeks. Again I explained that that is always variable but nonetheless I definitely think we can help. Fortunately I see no evidence of infection. He does have a foot offloading shoe which I think is good to attempt for now he was able to get this healed before with a peg assist. Patient does have a history of type 2 diabetes, diabetic polyneuropathy, and hypertension. This current wound started on 12-09-2021. 01-20-2022 upon evaluation today patient appears to be doing poorly in regard to his great toe. I am actually very concerned based on what I am seeing here at this point about how things are progressing. I do not believe there is any evidence of infection systemically that is obvious but locally I am concerned about the possibility this could even be an osteomyelitis type situation. 01-28-2022 upon evaluation today patient appears to be doing well with regard to his toe ulcer compared to what we were previous. Fortunately I do not see any signs of active infection at this time which is great news. No fevers, chills, nausea, vomiting, or diarrhea. With that being said the patient has not  started his Augmentin yet which I definitely think he needs to get on board ASAP. I do see some signs of new skin growth I think he is using the front offloading shoe which is good for the time being. 02-04-2022 upon evaluation today patient appears to be doing okay in regard to his toe ulcer this is actually measuring a little bit smaller but still has a bit of callus buildup but not as much as we have seen in the past. Fortunately I do not see any evidence of active infection locally or systemically which is great news. No fevers, chills, nausea, vomiting, or diarrhea. Marland Kitchen 02-14-2022 upon evaluation today patient's wound is actually showing signs of good improvement which is great news. Fortunately I do not see any evidence of active infection locally or systemically which is great news as well. No fevers, chills, nausea, vomiting, or diarrhea. 02-21-2022 upon evaluation today patient's toe ulcer actually is showing signs of excellent improvement. I do not see any evidence of infection locally or systemically which is great news and overall I am extremely pleased with where we stand today. I do not see any signs of active infection locally or systemically which is great news. No fevers, chills, nausea, vomiting, or diarrhea. 7/24; plantar left great toe no real improvement since last week. Using silver alginate gauze. He has  a forefoot offloading boot. He also has lymphedema and uses his compression pumps I do not think this is adding any particular difficulties. It sounds as though he is still pretty active 03-07-2022 upon evaluation today patient appears to be doing excellent in regard to the wound compared to previous pictures. He still has a quite deep wound on the toe good news is the x-ray did not show any signs of osteomyelitis which is excellent. He does need some debridement today. 03-14-2022 upon evaluation today patient appears to be doing well currently in regard to his wound. He has actually  been tolerating the dressing changes without complication. With that being said the dressing seems to either be draining a lot or getting very wet otherwise I think we need to have him change this more frequently. 03-21-2022 upon evaluation today patient appears to be doing well with regard to his toe ulcer I am actually seeing signs of improvement which is great news. This is slow but nonetheless the skin around the edges of the wound are definitely improving as we proceed here. I do not see any evidence of active infection locally or systemically which is great news. 03-29-2022 upon evaluation today patient appears to be doing fairly well except for with regard to his toe being somewhat red. I do think that we may want to place him back on the Augmentin which was previously beneficial for him. He is not in disagreement with this. Otherwise I did perform some debridement of clearway some of the necrotic debris today he seems to be tolerating this without complication. 04-04-2022 upon evaluation today patient unfortunately is continued to have significant issues here with his toe ulcer. He actually has an abscess more proximal on the dorsal aspect of the toe which is new since last week. Subsequently he is on antibiotics already I think we need to proceed with an MRI to further evaluate this. 04-12-2022 upon evaluation today patient appears to be doing better in regard to his toe ulcer. This actually is less red than what it was he is about done with the initial antibiotic he does has his MRI scheduled for Friday. The reason I am going to probably extend the Augmentin for the time being I will to make sure that we keep this under control especially if he potentially has a bone infection I do not going to be going without the antibiotic therapy. Patient is in agreement with that plan. 04-18-2022 upon evaluation today patient does have an MRI for review that was obtained on Sunday. He I am going to go  through this with him today it did show that he has a septic joint and the interphalangeal joint as well as proximal and distal phalanges osteomyelitis of the great toe. He has been on antibiotics I had him on Augmentin starting initially on January 25, 2022. Continuing to be a problem here. I do not see any signs of infection spreading up his foot but the toe itself still has purulent drainage coming from it. Coupled with this I have had him on the antibiotics, Augmentin, and he is tolerating that quite well. He is actually been on this since 01-20-2022. During that time his wound is getting somewhat better. But is still been very slow which is what prompted the MRI due to the fact that I felt like at a later point he started develop an abscess on the top of his toe that then had me more concerned. At this point I do believe that  he is probably can require hyperbaric oxygen therapy I think that this is the ideal treatment plan for limb salvage coupled with the antibiotic therapy and good wound care. 04-25-2022 upon evaluation today patient appears to be doing okay in regard to his foot ulcers. He actually has an opening on the dorsal aspect of the toe which does not directly connect to the plantar aspect. This is still open in both locations he did have some callus covering that did require sharp debridement today to clear this away. Fortunately there does not appear to be any evidence of active infection at this point. 05-02-2022 upon evaluation today patient appears to be doing well in regard to his wounds. He has been tolerating the dressing changes without complication. With that being said he does have some evidence here of continued drainage. There is evidence of infection as well although I do believe that the medicine is helping to keep this under good control. His erythema is improved although the swelling of the great toe is still present. He has started HBO as of this morning. 05-10-2022 upon  evaluation patient's ulcers on his toe actually are appearing to do much better. I do think he is benefiting from the hyperbarics as well as the antibiotics as well is good wound care. Overall I think that we have a good plan going and the patient is benefiting from this greatly which is also news. I do not see any signs of clinical active infection although obviously were still in the process of treating this osteomyelitis 05-17-2022 upon evaluation today patient's wounds are actually showing signs of good granulation and epithelization there is actually some callus covering To check to see whether this is still open or not. Nonetheless I do believe both wounds are significantly improved compared to previous and there is no erythema or redness-like his toe is doing better and I think he is doing well with the hyperbarics as well as with the oral antibiotics and wound care currently. 05-24-2022 upon evaluation today patient's toe was actually doing excellent. I am very pleased with where things stand and he is making great progress as far as I am concerned. I do not see any evidence of active infection locally or systemically which is great news. No fevers, chills, nausea, vomiting, or diarrhea. CHAUNCE, WINKELS ([id number]) [id number] Page 3 of 9 05-31-2022 upon evaluation today patient appears to be doing excellent in regard to his great toe in fact he actually appears to be completely healed based on what I am seeing. Between the hyperbarics and antibiotics he is really doing quite well along with good wound care and I am very pleased with what we are seeing at this point. He is extremely happy as well. I do think he needs to continue both antibiotics and the hyperbarics to completion to ensure the osteomyelitis completely resolves. 06-07-2022 upon evaluation today patient appears to be doing well currently in regard to his toe ulcers which again do appear to be completely  closed. Again were continue with the hyperbarics to ensure that everything completely resolves and does not return and I will make sure the osteomyelitis is completely treated. He is in agreement with this plan and subsequently were continuing to the full completion of 40 treatments. Nonetheless at this point this is his 30-day evaluation and he seems to be doing quite well everything still is remaining closed. He is now on a flat postop shoe as opposed to the front off loader. We are slowly progressing  to get him into his regular shoe. 06-28-2022 upon evaluation today patient appears to be doing well currently in regard to his original wounds which do appear to be healed. Unfortunately had an area where he hit his toe on the dorsal aspect and a separate area from where this initially was. Subsequently this led to a superficial skin tear nothing deep and it does not appear to be infected today with he has been putting Hydrofera Blue on and I think that is doing a good job. Fortunately I do not see any signs of infection at this time. 07-12-2022 upon evaluation today patient appears to be doing excellent in fact he appears to be completely healed at this point. Fortunately I see no signs of infection at this time. Electronic Signature(s) Signed: 07/12/2022 10:39:22 AM By: Lenda Kelp PA-C Entered By: Lenda Kelp on 07/12/2022 10:39:22 -------------------------------------------------------------------------------- Physical Exam Details Patient Name: Date of Service: [name], [name] 07/12/2022 9:45 A M Medical Record Number: [id number] Patient Account Number: [id number] Date of Birth/Sex: Treating RN: 1950/11/23 (71 y.o. [name] Primary Care Provider: Barbette Reichmann Other Clinician: Betha Loa Referring Provider: Treating Provider/Extender: Jearld Shines Weeks in Treatment: 35 Constitutional Well-nourished and well-hydrated in no acute  distress. Respiratory normal breathing without difficulty. Psychiatric this patient is able to make decisions and demonstrates good insight into disease process. Alert and Oriented x 3. pleasant and cooperative. Notes Patient's wound currently actually is showing signs of improvement which is good news in fact he is completely healed which is excellent. Electronic Signature(s) Signed: 07/12/2022 3:07:53 PM By: Lenda Kelp PA-C Entered By: Lenda Kelp on 07/12/2022 15:07:53 Physician Orders Details -------------------------------------------------------------------------------- [name] ([id number]) [id number] Page 4 of 9 Patient Name: Date of Service: Gene Brown, [name] 07/12/2022 9:45 A M Medical Record Number: [id number] Patient Account Number: [id number] Date of Birth/Sex: Treating RN: 1950-09-12 (71 y.o. [name], [name] Primary Care Provider: Barbette Reichmann Other Clinician: Betha Loa Referring Provider: Treating Provider/Extender: Jearld Shines Weeks in Treatment: 67 Verbal / Phone Orders: No Diagnosis Coding ICD-10 Coding Code Description [phone] Other chronic osteomyelitis, left ankle and foot E11.621 Type 2 diabetes mellitus with foot ulcer 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osed E11.42 Type 2 diabetes mellitus with diabetic polyneuropathy I10 Essential (primary) hypertension Discharge From [facility] Services Discharge from Wound Care Center Treatment Complete Edema Control - Lymphedema / Segmental Compressive Device / Other Patient to wear own Velcro compression garment. Remove compression stockings every night before going to bed and put on every morning when getting up. Compression Pump: Use compression pump on left lower extremity for 60 minutes, twice daily. Compression Pump: Use compression pump on right lower extremity for 60 minutes, twice daily. DO YOUR BEST to  sleep in the bed at night. DO NOT sleep in your recliner. Long hours of sitting in a recliner leads to swelling of the legs and/or potential wounds on your backside. Electronic Signature(s) Signed: 07/12/2022 4:28:12 PM By: Lenda Kelp PA-C Signed: 07/15/2022 10:21:17 AM By: Betha Loa Entered By: Betha Loa on 07/12/2022 10:34:37 Prescription 07/12/2022 -------------------------------------------------------------------------------- Louann Sjogren PA-C Patient Name: Provider: 09-26-1950 [id number] Date of Birth: NPI#: [name] [id number] Sex: DEA #: [id number] Phone #: License #: [facility] Patient Address: [address] [facility] [place], [place] [address] [address], [address] [place], [place] [address] [phone] Allergies Statins-Hmg-Coa Reductase  Inhibitors Provider's Orders Patient to wear own Velcro compression garment. Remove compression stockings every night before going to bed and put on every morning when getting up. Hand Signature: Date(s): Electronic Signature(s) [name], Berwyn V ([id number]) [id number] Page 5 of 9 Signed: 07/12/2022 4:28:12 PM By: Lenda KelpStone III, Annessa Satre PA-C Signed: 07/15/2022 10:21:17 AM By: Betha LoaVenable, Angie Entered By: Betha LoaVenable, Angie on 07/12/2022 10:34:37 -------------------------------------------------------------------------------- Problem List Details Patient Name: Date of Service: Gene HeckGRIBBO NS, [name] 07/12/2022 9:45 A M Medical Record Number: [id number] Patient Account Number: [id number] Date of Birth/Sex: Treating RN: 01/06/1951 (71 y.o. [name]) Woody, Kim Primary Care Provider: Barbette ReichmannHande, Vishwanath Other Clinician: Betha LoaVenable, Angie Referring Provider: Treating Provider/Extender: Jearld ShinesStone, Zayaan Kozak Hande, Vishwanath Weeks in Treatment: 26 Active Problems ICD-10 Encounter Code Description Active Date MDM Diagnosis [phone] Other chronic osteomyelitis,  left ankle and foot 04/25/2022 No Yes E11.621 Type 2 diabetes mellitus with foot ulcer 01/07/2022 No Yes 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osed 01/07/2022 No Yes E11.42 Type 2 diabetes mellitus with diabetic polyneuropathy 01/07/2022 No Yes I10 Essential (primary) hypertension 01/07/2022 No Yes Inactive Problems Resolved Problems Electronic Signature(s) Signed: 07/12/2022 9:59:15 AM By: Lenda KelpStone III, Leiya Keesey PA-C Entered By: Lenda KelpStone III, Ericah Scotto on 07/12/2022 09:59:15 [name], Lazar V ([id number]) [id number] Page 6 of 9 -------------------------------------------------------------------------------- Progress Note Details Patient Name: Date of Service: Gene HeckGRIBBO NS, [place]PA UL V. 07/12/2022 9:45 A M Medical Record Number: [id number] Patient Account Number: [id number] Date of Birth/Sex: Treating RN: 03/12/1951 (71 y.o. [name]) Woody, Kim Primary Care Provider: Barbette ReichmannHande, Vishwanath Other Clinician: Betha LoaVenable, Angie Referring Provider: Treating Provider/Extender: Jearld ShinesStone, Phelix Fudala Hande, Vishwanath Weeks in Treatment: 26 Subjective Chief Complaint Information obtained from Patient Left great toe ulcer History of Present Illness (HPI) 05/24/16; this is a 71 year old type II diabetic who probably has some degree of polyneuropathy. He tells me that he has had an area on the plantar aspect of his left great toe that is been followed by podiatry for most the last year. He has a lot of callus, the podiatrist which shave this down put a dressing on and he would follow pad monthly or sometimes longer intervals. He has not noted any drainage or pain. He has not had a recent x-ray. No serious attempt at offloading this with modified footwear.. Last hemoglobin A1c that he knows about was over 10 a month ago he is working hard on this with his primary physician. More recently he has been soaking this with peroxide ABIs are noncompressible and the left leg. He was told by  his podiatrist he has a bone spur underneath the wound but did not wish any surgical procedures. He has a caregiver at home for a very disabled wife, the nature of her illness is not certain 05/31/16 patient comes in much the same as last week amounts of callus nonviable subcutaneous tissue which required debridement. His x-ray suggested cortical bone irregularity. He will need an MRI. He tells me he has old screws/hardware left knee however he thinks he has had a subsequent MRI after this 06/07/16 at this point in time patient has been attempting to offload his wound as much as he could on his own. He has made a conscious effort to avoid placing any pressure on the toe as he was moving around and walking and I do believe this has payed off and what I am seeing at this point in time. overall the wound appears to be somewhat improved in my opinion he is not having any significant discomfort at  this point in time. In fact he tells me he has no pain at all. There is no interval sign of surrounding infection in the wound itself has actually decreased in size. Unfortunately the MRI was not ordered last week and therefore we do need to proceed with the MRI order today. again this is to evaluate the cortical bone irregularity noted on x-ray. 06/14/16 Patient has continued to use the offloading shoe at this point in time. He tells me this is somewhat difficult to get used to walking and but nonetheless he is very strict about wearing it. Again I believe this has paid off very well for him and his wound appears to be greatly improved. He does have some callused area surrounding the wound proximally that has caused some fluid collection of maceration of the callused area. This likely will require debridement today. Some adherent slough noted over the wound bed. 06/21/16; patient's wound on the plantar aspect of his left great toe is totally epithelialized. His MRI of the foot is tomorrow to follow-up on the  cortical irregularity noted on plain x-ray. He does not have diabetic foot where 06-28-16-He comes in today with a continued healed left great toe. MRI performed on 06/23/2016 shows no abscess osteomyelitis or septic joint. Patient was discharged from wound care center today. Readmission: 01-07-2022 upon evaluation today patient appears to be doing somewhat poorly in regard to total ulcer on his left foot. Fortunately there does not appear to be any signs of infection which is great news. Has been seen by Dr. Excell Seltzer and he did request from Dr. Excell Seltzer to come see [place] due to the fact that in the past several years back we were able to get one of his wounds healed about 4 weeks. Again I explained that that is always variable but nonetheless I definitely think we can help. Fortunately I see no evidence of infection. He does have a foot offloading shoe which I think is good to attempt for now he was able to get this healed before with a peg assist. Patient does have a history of type 2 diabetes, diabetic polyneuropathy, and hypertension. This current wound started on 12-09-2021. 01-20-2022 upon evaluation today patient appears to be doing poorly in regard to his great toe. I am actually very concerned based on what I am seeing here at this point about how things are progressing. I do not believe there is any evidence of infection systemically that is obvious but locally I am concerned about the possibility this could even be an osteomyelitis type situation. 01-28-2022 upon evaluation today patient appears to be doing well with regard to his toe ulcer compared to what we were previous. Fortunately I do not see any signs of active infection at this time which is great news. No fevers, chills, nausea, vomiting, or diarrhea. With that being said the patient has not started his Augmentin yet which I definitely think he needs to get on board ASAP. I do see some signs of new skin growth I think he is using the front  offloading shoe which is good for the time being. 02-04-2022 upon evaluation today patient appears to be doing okay in regard to his toe ulcer this is actually measuring a little bit smaller but still has a bit of callus buildup but not as much as we have seen in the past. Fortunately I do not see any evidence of active infection locally or systemically which is great news. No fevers, chills, nausea, vomiting, or diarrhea. Marland Kitchen  02-14-2022 upon evaluation today patient's wound is actually showing signs of good improvement which is great news. Fortunately I do not see any evidence of active infection locally or systemically which is great news as well. No fevers, chills, nausea, vomiting, or diarrhea. 02-21-2022 upon evaluation today patient's toe ulcer actually is showing signs of excellent improvement. I do not see any evidence of infection locally or systemically which is great news and overall I am extremely pleased with where we stand today. I do not see any signs of active infection locally or systemically which is great news. No fevers, chills, nausea, vomiting, or diarrhea. 7/24; plantar left great toe no real improvement since last week. Using silver alginate gauze. He has a forefoot offloading boot. He also has lymphedema and uses his compression pumps I do not think this is adding any particular difficulties. It sounds as though he is still pretty active 03-07-2022 upon evaluation today patient appears to be doing excellent in regard to the wound compared to previous pictures. He still has a quite deep wound on the toe good news is the x-ray did not show any signs of osteomyelitis which is excellent. He does need some debridement today. 03-14-2022 upon evaluation today patient appears to be doing well currently in regard to his wound. He has actually been tolerating the dressing changes without complication. With that being said the dressing seems to either be draining a lot or getting very wet  otherwise I think we need to have him change this more frequently. 03-21-2022 upon evaluation today patient appears to be doing well with regard to his toe ulcer I am actually seeing signs of improvement which is great news. This is slow but nonetheless the skin around the edges of the wound are definitely improving as we proceed here. I do not see any evidence of active infection locally or systemically which is great news. 03-29-2022 upon evaluation today patient appears to be doing fairly well except for with regard to his toe being somewhat red. I do think that we may want to place him back on the Augmentin which was previously beneficial for him. He is not in disagreement with this. Otherwise I did perform some debridement of clearway some of the necrotic debris today he seems to be tolerating this without complication. SANDY, BLOUCH ([id number]) [id number] Page 7 of 9 04-04-2022 upon evaluation today patient unfortunately is continued to have significant issues here with his toe ulcer. He actually has an abscess more proximal on the dorsal aspect of the toe which is new since last week. Subsequently he is on antibiotics already I think we need to proceed with an MRI to further evaluate this. 04-12-2022 upon evaluation today patient appears to be doing better in regard to his toe ulcer. This actually is less red than what it was he is about done with the initial antibiotic he does has his MRI scheduled for Friday. The reason I am going to probably extend the Augmentin for the time being I will to make sure that we keep this under control especially if he potentially has a bone infection I do not going to be going without the antibiotic therapy. Patient is in agreement with that plan. 04-18-2022 upon evaluation today patient does have an MRI for review that was obtained on Sunday. He I am going to go through this with him today it did show that he has a septic joint and  the interphalangeal joint as well as proximal and distal phalanges osteomyelitis  of the great toe. He has been on antibiotics I had him on Augmentin starting initially on January 25, 2022. Continuing to be a problem here. I do not see any signs of infection spreading up his foot but the toe itself still has purulent drainage coming from it. Coupled with this I have had him on the antibiotics, Augmentin, and he is tolerating that quite well. He is actually been on this since 01-20-2022. During that time his wound is getting somewhat better. But is still been very slow which is what prompted the MRI due to the fact that I felt like at a later point he started develop an abscess on the top of his toe that then had me more concerned. At this point I do believe that he is probably can require hyperbaric oxygen therapy I think that this is the ideal treatment plan for limb salvage coupled with the antibiotic therapy and good wound care. 04-25-2022 upon evaluation today patient appears to be doing okay in regard to his foot ulcers. He actually has an opening on the dorsal aspect of the toe which does not directly connect to the plantar aspect. This is still open in both locations he did have some callus covering that did require sharp debridement today to clear this away. Fortunately there does not appear to be any evidence of active infection at this point. 05-02-2022 upon evaluation today patient appears to be doing well in regard to his wounds. He has been tolerating the dressing changes without complication. With that being said he does have some evidence here of continued drainage. There is evidence of infection as well although I do believe that the medicine is helping to keep this under good control. His erythema is improved although the swelling of the great toe is still present. He has started HBO as of this morning. 05-10-2022 upon evaluation patient's ulcers on his toe actually are appearing to do much  better. I do think he is benefiting from the hyperbarics as well as the antibiotics as well is good wound care. Overall I think that we have a good plan going and the patient is benefiting from this greatly which is also news. I do not see any signs of clinical active infection although obviously were still in the process of treating this osteomyelitis 05-17-2022 upon evaluation today patient's wounds are actually showing signs of good granulation and epithelization there is actually some callus covering To check to see whether this is still open or not. Nonetheless I do believe both wounds are significantly improved compared to previous and there is no erythema or redness-like his toe is doing better and I think he is doing well with the hyperbarics as well as with the oral antibiotics and wound care currently. 05-24-2022 upon evaluation today patient's toe was actually doing excellent. I am very pleased with where things stand and he is making great progress as far as I am concerned. I do not see any evidence of active infection locally or systemically which is great news. No fevers, chills, nausea, vomiting, or diarrhea. 05-31-2022 upon evaluation today patient appears to be doing excellent in regard to his great toe in fact he actually appears to be completely healed based on what I am seeing. Between the hyperbarics and antibiotics he is really doing quite well along with good wound care and I am very pleased with what we are seeing at this point. He is extremely happy as well. I do think he needs to continue both  antibiotics and the hyperbarics to completion to ensure the osteomyelitis completely resolves. 06-07-2022 upon evaluation today patient appears to be doing well currently in regard to his toe ulcers which again do appear to be completely closed. Again were continue with the hyperbarics to ensure that everything completely resolves and does not return and I will make sure the osteomyelitis  is completely treated. He is in agreement with this plan and subsequently were continuing to the full completion of 40 treatments. Nonetheless at this point this is his 30-day evaluation and he seems to be doing quite well everything still is remaining closed. He is now on a flat postop shoe as opposed to the front off loader. We are slowly progressing to get him into his regular shoe. 06-28-2022 upon evaluation today patient appears to be doing well currently in regard to his original wounds which do appear to be healed. Unfortunately had an area where he hit his toe on the dorsal aspect and a separate area from where this initially was. Subsequently this led to a superficial skin tear nothing deep and it does not appear to be infected today with he has been putting Hydrofera Blue on and I think that is doing a good job. Fortunately I do not see any signs of infection at this time. 07-12-2022 upon evaluation today patient appears to be doing excellent in fact he appears to be completely healed at this point. Fortunately I see no signs of infection at this time. Objective Constitutional Well-nourished and well-hydrated in no acute distress. Vitals Time Taken: 10:19 AM, Height: 68 in, Weight: 217 lbs, BMI: 33, Temperature: 97.9 F, Pulse: 72 bpm, Respiratory Rate: 18 breaths/min, Blood Pressure: 174/98 mmHg. General Notes: Patient states "I took my medication just before coming to this appointment" Respiratory normal breathing without difficulty. Psychiatric this patient is able to make decisions and demonstrates good insight into disease process. Alert and Oriented x 3. pleasant and cooperative. General Notes: Patient's wound currently actually is showing signs of improvement which is good news in fact he is completely healed which is excellent. Integumentary (Hair, Skin) Wound #4 status is Healed - Epithelialized. Original cause of wound was Trauma. The date acquired was: 06/25/2022. The wound  has been in treatment 2 weeks. The wound is located on the Left,Dorsal T Great. The wound measures 0cm length x 0cm width x 0cm depth; 0cm^2 area and 0cm^3 volume. There is no oe tunneling or undermining noted. There is a small amount of serosanguineous drainage noted. There is no granulation within the wound bed. There is no necrotic tissue within the wound bed. DAVARIUS, RIDENER ([id number]) [id number] Page 8 of 9 Assessment Active Problems ICD-10 Other chronic osteomyelitis, left ankle and foot Type 2 diabetes mellitus with foot ulcer Non-pressure chronic ulcer of other part of left foot with fat layer exposed Type 2 diabetes mellitus with diabetic polyneuropathy Essential (primary) hypertension Plan Discharge From [facility] Services: Discharge from Wound Care Center Treatment Complete Edema Control - Lymphedema / Segmental Compressive Device / Other: Patient to wear own Velcro compression garment. Remove compression stockings every night before going to bed and put on every morning when getting up. Compression Pump: Use compression pump on left lower extremity for 60 minutes, twice daily. Compression Pump: Use compression pump on right lower extremity for 60 minutes, twice daily. DO YOUR BEST to sleep in the bed at night. DO NOT sleep in your recliner. Long hours of sitting in a recliner leads to swelling of the legs and/or potential  wounds on your backside. 1. I am going to suggest based on what we are seeing that we go ahead and discontinue wound care services as the patient appears to be completely healed. 2. I am also can recommend the patient should wear his own diabetic shoes I think that could be ideal. 3. He should also be wearing his compression socks and I suggested that he also use his lymphedema pumps on a regular basis. We will see patient back for reevaluation in 1 week here in the clinic. If anything worsens or changes patient will contact our office  for additional recommendations. Electronic Signature(s) Signed: 07/12/2022 3:08:32 PM By: Lenda Kelp PA-C Entered By: Lenda Kelp on 07/12/2022 15:08:31 -------------------------------------------------------------------------------- SuperBill Details Patient Name: Date of Service: Gene Heck, [name] 07/12/2022 Medical Record Number: [id number] Patient Account Number: [id number] Date of Birth/Sex: Treating RN: Dec 01, 1950 (71 y.o. [name], [name] Primary Care Provider: Barbette Reichmann Other Clinician: Betha Loa Referring Provider: Treating Provider/Extender: Jearld Shines Weeks in Treatment: 26 Diagnosis Coding ICD-10 Codes Code Description [phone] Other chronic osteomyelitis, left ankle and foot E11.621 Type 2 diabetes mellitus with foot ulcer 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osed E11.42 Type 2 diabetes mellitus with diabetic polyneuropathy I10 Essential (primary) hypertension Facility Procedures NOVAK, STGERMAINE ([id number]): CPT4 Code Description [id number] [phone] - WOUND CARE VISIT-LEV 2 EST PT [id number] Page 9 of 9: Modifier Quantity 1 Physician Procedures : CPT4 Code Description Modifier [phone] [id number] - WC PHYS LEVEL 3 - EST PT ICD-10 Diagnosis Description M86.672 Other chronic osteomyelitis, left ankle and foot E11.621 Type 2 diabetes mellitus with foot ulcer L97.522 Non-pressure chronic ulcer of other  part of left foot with fat layer exposed E11.42 Type 2 diabetes mellitus with diabetic polyneuropathy Quantity: 1 Electronic Signature(s) Signed: 07/12/2022 3:08:42 PM By: Lenda Kelp PA-C Entered By: Lenda Kelp on 07/12/2022 15:08:42

## 2022-07-12 NOTE — Progress Notes (Addendum)
Gene Brown, Gene Brown ([id number]) [id number] Page 1 of 8 Visit Report for 07/12/2022 Arrival Information Details Patient Name: Date of Service: Gene Brown, [place] UL Brown. 07/12/2022 9:45 A M Medical Record Number: [id number] Patient Account Number: [id number] Date of Birth/Sex: Treating RN: 03/31/1951 (71 y.o. Gene Brown) Woody, Kim Primary Care Treonna Klee: Barbette ReichmannHande, Vishwanath Other Clinician: Betha LoaVenable, Angie Referring Richar Dunklee: Treating Jamus Loving/Extender: Jearld ShinesStone, Hoyt Hande, Vishwanath Weeks in Treatment: 26 Visit Information History Since Last Visit All ordered tests and consults were completed: No Patient Arrived: Gilmer MorCane Added or deleted any medications: No Arrival Time: 10:14 Any new allergies or adverse reactions: No Transfer Assistance: None Had a fall or experienced change in No Patient Identification Verified: Yes activities of daily living that may affect Secondary Verification Process Completed: Yes risk of falls: Patient Requires Transmission-Based Precautions: No Signs or symptoms of abuse/neglect since last visito No Patient Has Alerts: Yes Hospitalized since last visit: No Patient Alerts: Patient on Blood Thinner Implantable device outside of the clinic excluding No 01/07/2022 [place]>220 on left cellular tissue based products placed in the center since last visit: Has Dressing in Place as Prescribed: Yes Pain Present Now: No Electronic Signature(s) Signed: 07/15/2022 10:21:17 AM By: Betha LoaVenable, Angie Entered By: Betha LoaVenable, Angie on 07/12/2022 10:16:49 -------------------------------------------------------------------------------- Clinic Level of Care Assessment Details Patient Name: Date of Service: Gene Brown, [place] UL Brown. 07/12/2022 9:45 A M Medical Record Number: [id number] Patient Account Number: [id number] Date of Birth/Sex: Treating RN: 05/06/1951 (71 y.o. Gene Brown) Woody, Kim Primary Care Tykira Wachs: Barbette ReichmannHande, Vishwanath Other Clinician: Betha LoaVenable, Angie Referring Danitza Schoenfeldt: Treating  Jenipher Havel/Extender: Jearld ShinesStone, Hoyt Hande, Vishwanath Weeks in Treatment: 26 Clinic Level of Care Assessment Items TOOL 4 Quantity Score []  - 0 Use when only an EandM is performed on FOLLOW-UP visit ASSESSMENTS - Nursing Assessment / Reassessment X- 1 10 Reassessment of Co-morbidities (includes updates in patient status) X- 1 5 Reassessment of Adherence to Treatment Plan Gene Brown, Gene Brown ([id number]) [id number] Page 2 of 8 ASSESSMENTS - Wound and Skin A ssessment / Reassessment X - Simple Wound Assessment / Reassessment - one wound 1 5 []  - 0 Complex Wound Assessment / Reassessment - multiple wounds []  - 0 Dermatologic / Skin Assessment (not related to wound area) ASSESSMENTS - Focused Assessment []  - 0 Circumferential Edema Measurements - multi extremities []  - 0 Nutritional Assessment / Counseling / Intervention []  - 0 Lower Extremity Assessment (monofilament, tuning fork, pulses) []  - 0 Peripheral Arterial Disease Assessment (using hand held doppler) ASSESSMENTS - Ostomy and/or Continence Assessment and Care []  - 0 Incontinence Assessment and Management []  - 0 Ostomy Care Assessment and Management (repouching, etc.) PROCESS - Coordination of Care X - Simple Patient / Family Education for ongoing care 1 15 []  - 0 Complex (extensive) Patient / Family Education for ongoing care []  - 0 Staff obtains ChiropractorConsents, Records, T Results / Process Orders est []  - 0 Staff telephones HHA, Nursing Homes / Clarify orders / etc []  - 0 Routine Transfer to another Facility (non-emergent condition) []  - 0 Routine Hospital Admission (non-emergent condition) []  - 0 New Admissions / Manufacturing engineernsurance Authorizations / Ordering NPWT Apligraf, etc. , []  - 0 Emergency Hospital Admission (emergent condition) X- 1 10 Simple Discharge Coordination []  - 0 Complex (extensive) Discharge Coordination PROCESS - Special Needs []  - 0 Pediatric / Minor Patient Management []  -  0 Isolation Patient Management []  - 0 Hearing / Language / Visual special needs []  - 0 Assessment of Community assistance (transportation, D/C planning, etc.) []  - 0 Additional assistance / Altered mentation []  -  0 Support Surface(s) Assessment (bed, cushion, seat, etc.) INTERVENTIONS - Wound Cleansing / Measurement X - Simple Wound Cleansing - one wound 1 5 []  - 0 Complex Wound Cleansing - multiple wounds X- 1 5 Wound Imaging (photographs - any number of wounds) []  - 0 Wound Tracing (instead of photographs) []  - 0 Simple Wound Measurement - one wound []  - 0 Complex Wound Measurement - multiple wounds INTERVENTIONS - Wound Dressings []  - 0 Small Wound Dressing one or multiple wounds []  - 0 Medium Wound Dressing one or multiple wounds []  - 0 Large Wound Dressing one or multiple wounds []  - 0 Application of Medications - topical []  - 0 Application of Medications - injection INTERVENTIONS - Miscellaneous []  - 0 External ear exam AUGUSTA, HILBERT (  .pdf Page 3 of 8 []  - 0 Specimen Collection (cultures, biopsies, blood, body fluids, etc.) []  - 0 Specimen(s) / Culture(s) sent or taken to Lab for analysis []  - 0 Patient Transfer (multiple staff / Lift / Similar devices) []  - 0 Simple Staple / Suture removal (25 or less) []  - 0 Complex Staple / Suture removal (26 or more) []  - 0 Hypo / Hyperglycemic Management (close monitor of Blood Glucose) []  - 0 Ankle / Brachial Index (ABI) - do not check if billed separately X- 1 5 Vital Signs Has the patient been seen at the hospital within the last three years: Yes Total Score: 60 Level Of Care: New/Established - Level 2 Electronic Signature(s) Signed: 07/15/2022 10:21:17 AM By: Entered By: on 07/12/2022 10:38:45 -------------------------------------------------------------------------------- Encounter Discharge Information Details Patient Name:  Date of Service: , [name] Brown. 07/12/2022 9:45 A M Medical Record Number: Gene Bang Patient Account Number: [id number] Date of Birth/Sex: Treating RN: 22-Jun-1951 (71 y.o. Primary Care Satoshi Kalas: Other Clinician: Michiel Sites Referring Zakirah Weingart: Treating Derrious Bologna/Extender: Weeks in Treatment: 26 Encounter Discharge Information Items Discharge Condition: Stable Ambulatory Status: Cane Discharge Destination: Home Transportation: Private Auto Accompanied By: self Schedule Follow-up Appointment: No Clinical Summary of Care: Electronic Signature(s) Signed: 07/15/2022 10:21:17 AM By: Entered By: 14/03/2022 on 07/12/2022 14:17:15 Lower Extremity Assessment Details -------------------------------------------------------------------------------- Betha Loa (14/05/2023Harvie Brown.pdf Page 4 of 8 Patient Name: Date of Service: 14/12/2021, [id number] UL Brown. 07/12/2022 9:45 A M Medical Record Number: 12/08/1950 Patient Account Number: [id number] Date of Birth/Sex: Treating RN: 1951-07-06 (71 y.o. Betha Loa Primary Care Imanol Bihl: Jearld Shines Other Clinician: 27 Referring Isa Kohlenberg: Treating Malaika Arnall/Extender: 14/03/2022 Weeks in Treatment: 26 Edema Assessment Left: Right: Assessed: Yes No Edema: Yes Calf Left: Right: Point of Measurement: 38 cm From Medial Instep 40.2 cm Ankle Left: Right: Point of Measurement: 11 cm From Medial Instep 27.1 cm Vascular Assessment Left: Right: Pulses: Dorsalis Pedis Palpable: Yes Electronic Signature(s) Signed: 07/12/2022 5:15:20 PM By: Betha Loa, BSN, RN, CWS, Kim RN, BSN Signed: 07/15/2022 10:21:17 AM By: Gene Bang Entered By: [id number] on 07/12/2022 10:27:38 -------------------------------------------------------------------------------- Multi Wound Chart Details Patient Name: Date of  Service: Gene Heck, [name] Brown. 07/12/2022 9:45 A M Medical Record Number: 14/12/2021 Patient Account Number: [id number] Date of Birth/Sex: Treating RN: 1950-09-21 (71 y.o. 61 Primary Care Shirlette Scarber: Gene Holms Other Clinician: Barbette Reichmann Referring Zailen Albarran: Treating Canary Fister/Extender: Betha Loa Weeks in Treatment: 26 Vital Signs Height(in): 68 Pulse(bpm): 72 Weight(lbs): 217 Blood Pressure(mmHg): 174/98 Body Mass Index(BMI): 33 Temperature(F): 97.9 Respiratory Rate(breaths/min): 18 [4:Photos:] [N/A:N/A] Left, Dorsal T Great oe N/A  N/A Wound Location: [facility], [facility] ([id number]) [id number] Page 5 of 8 Trauma N/A N/A Wounding Event: Abrasion N/A N/A Primary Etiology: Cataracts, Sickle Cell Disease, Sleep N/A N/A Comorbid History: Apnea, Hypertension, Type II Diabetes, Gout, Osteoarthritis 06/25/2022 N/A N/A Date Acquired: 2 N/A N/A Weeks of Treatment: Open N/A N/A Wound Status: No N/A N/A Wound Recurrence: 0.1x0.1x0.1 N/A N/A Measurements L x W x D (cm) 0.008 N/A N/A A (cm) : rea 0.001 N/A N/A Volume (cm) : 99.50% N/A N/A % Reduction in A rea: 99.40% N/A N/A % Reduction in Volume: Partial Thickness N/A N/A Classification: Small N/A N/A Exudate A mount: Serosanguineous N/A N/A Exudate Type: red, brown N/A N/A Exudate Color: None Present (0%) N/A N/A Granulation A mount: None Present (0%) N/A N/A Necrotic A mount: Fascia: No N/A N/A Exposed Structures: Fat Layer (Subcutaneous Tissue): No Tendon: No Muscle: No Joint: No Bone: No Large (67-100%) N/A N/A Epithelialization: Treatment Notes Electronic Signature(s) Signed: 07/15/2022 10:21:17 AM By: Betha Loa Entered By: Betha Loa on 07/12/2022 10:27:42 -------------------------------------------------------------------------------- Multi-Disciplinary Care Plan Details Patient Name: Date of Service: Gene Heck, [name] Brown.  07/12/2022 9:45 A M Medical Record Number: [id number] Patient Account Number: [id number] Date of Birth/Sex: Treating RN: May 05, 1951 (71 y.o. Gene Holms Primary Care Vong Garringer: Barbette Reichmann Other Clinician: Betha Loa Referring Ivalee Strauser: Treating Mikhia Dusek/Extender: Jearld Shines Weeks in Treatment: 56 Active Inactive Electronic Signature(s) Signed: 07/12/2022 5:15:20 PM By: Elliot Gurney, BSN, RN, CWS, Kim RN, BSN Signed: 07/15/2022 10:21:17 AM By: Betha Loa Entered By: Betha Loa on 07/12/2022 11:14:46 Gene Bang ([id number]) [id number] Page 6 of 8 -------------------------------------------------------------------------------- Pain Assessment Details Patient Name: Date of Service: Gene Brown, [place] UL Brown. 07/12/2022 9:45 A M Medical Record Number: [id number] Patient Account Number: [id number] Date of Birth/Sex: Treating RN: November 10, 1950 (71 y.o. Gene Holms Primary Care Artasia Thang: Barbette Reichmann Other Clinician: Betha Loa Referring Falynn Ailey: Treating Ezriel Boffa/Extender: Jearld Shines Weeks in Treatment: 26 Active Problems Location of Pain Severity and Description of Pain Patient Has Paino No Site Locations Pain Management and Medication Current Pain Management: Electronic Signature(s) Signed: 07/12/2022 5:15:20 PM By: Elliot Gurney, BSN, RN, CWS, Kim RN, BSN Signed: 07/15/2022 10:21:17 AM By: Betha Loa Entered By: Betha Loa on 07/12/2022 10:20:39 -------------------------------------------------------------------------------- Patient/Caregiver Education Details Patient Name: Date of Service: Gene Heck, [name] Brown. 12/5/2023andnbsp9:45 A M Medical Record Number: [id number] Patient Account Number: [id number] Date of Birth/Gender: Treating RN: [birth year] (71 y.o. Gene Holms Primary Care Physician: Barbette Reichmann Other Clinician: Betha Loa Referring Physician: Treating Physician/Extender: Jearld Shines Weeks in Treatment: [address] [place] Brown ([id number]) [id number] Page 7 of 8 Education Assessment Education Provided To: Patient Education Topics Provided Wound/Skin Impairment: Handouts: Other: wound has healed please call if any further issues arise Methods: Explain/Verbal Responses: State content correctly Electronic Signature(s) Signed: 07/15/2022 10:21:17 AM By: Betha Loa Entered By: Betha Loa on 07/12/2022 11:14:07 -------------------------------------------------------------------------------- Wound Assessment Details Patient Name: Date of Service: Gene Heck, [name] Brown. 07/12/2022 9:45 A M Medical Record Number: [id number] Patient Account Number: [id number] Date of Birth/Sex: Treating RN: Oct 21, 1950 (71 y.o. Gene Holms Primary Care Catalina Salasar: Barbette Reichmann Other Clinician: Betha Loa Referring Anvith Mauriello: Treating Darcia Lampi/Extender: Jearld Shines Weeks in Treatment: 26 Wound Status Wound Number: 4 Primary Abrasion Etiology: Wound Location: Left, Dorsal T Great oe Wound Healed - Epithelialized Wounding Event: Trauma Status: Date Acquired: 06/25/2022 Comorbid Cataracts, Sickle Cell Disease, Sleep Apnea, Hypertension, Type Weeks Of Treatment: 2 History: II Diabetes, Gout, Osteoarthritis Clustered  Wound: No Photos Wound Measurements Length: (cm) Width: (cm) Depth: (cm) Area: (cm) Volume: (cm) 0 % Reduction in Area: 100% 0 % Reduction in Volume: 100% 0 Epithelialization: Large (67-100%) 0 Tunneling: No 0 Undermining: No Wound Description Classification: Partial Thickness Exudate Amount: Small Exudate Type: Serosanguineous JANELLE, CULTON Brown ([id number]) Exudate Color: red, brown Foul Odor After Cleansing: No Slough/Fibrino No [id number] Page 8 of 8 Wound Bed Granulation Amount: None Present (0%) Exposed Structure Necrotic Amount: None Present  (0%) Fascia Exposed: No Fat Layer (Subcutaneous Tissue) Exposed: No Tendon Exposed: No Muscle Exposed: No Joint Exposed: No Bone Exposed: No Treatment Notes Wound #4 (Toe Great) Wound Laterality: Dorsal, Left Cleanser Peri-Wound Care Topical Primary Dressing Secondary Dressing Secured With Compression Wrap Compression Stockings Add-Ons Electronic Signature(s) Signed: 07/12/2022 5:15:20 PM By: Elliot Gurney, BSN, RN, CWS, Kim RN, BSN Signed: 07/15/2022 10:21:17 AM By: Betha Loa Entered By: Betha Loa on 07/12/2022 10:32:40 -------------------------------------------------------------------------------- Vitals Details Patient Name: Date of Service: Gene Heck, [name] Brown. 07/12/2022 9:45 A M Medical Record Number: [id number] Patient Account Number: [id number] Date of Birth/Sex: Treating RN: 1950/12/01 (71 y.o. Gene Holms Primary Care Genie Mirabal: Barbette Reichmann Other Clinician: Betha Loa Referring Aztlan Coll: Treating Raymondo Garcialopez/Extender: Jearld Shines Weeks in Treatment: 26 Vital Signs Time Taken: 10:19 Temperature (F): 97.9 Height (in): 68 Pulse (bpm): 72 Weight (lbs): 217 Respiratory Rate (breaths/min): 18 Body Mass Index (BMI): 33 Blood Pressure (mmHg): 174/98 Reference Range: 80 - 120 mg / dl Notes Patient states "I took my medication just before coming to this appointment" Electronic Signature(s) Signed: 07/15/2022 10:21:17 AM By: Betha Loa Entered By: Betha Loa on 07/12/2022 10:20:34

## 2023-02-13 ENCOUNTER — Other Ambulatory Visit: Payer: Self-pay | Admitting: Orthopedic Surgery

## 2023-02-15 ENCOUNTER — Other Ambulatory Visit: Payer: Self-pay | Admitting: Orthopedic Surgery

## 2023-03-02 ENCOUNTER — Encounter
Admission: RE | Disposition: A | Discharge status: Home Health | Payer: Self-pay | Source: Home / Self Care | Attending: Orthopedic Surgery

## 2023-03-02 SURGERY — ARTHROPLASTY, KNEE, TOTAL
Anesthesia: Choice | Site: Knee | Laterality: Left

## 2023-03-22 ENCOUNTER — Other Ambulatory Visit: Payer: Self-pay

## 2023-03-22 ENCOUNTER — Encounter
Admission: RE | Admit: 2023-03-22 | Discharge: 2023-03-22 | Disposition: A | Discharge status: Home / Self Care | Payer: Medicare Other | Source: Ambulatory Visit | Attending: Orthopedic Surgery | Admitting: Orthopedic Surgery

## 2023-03-22 VITALS — BP 160/72 | HR 68 | Resp 18 | Ht 68.0 in | Wt 244.5 lb

## 2023-03-22 DIAGNOSIS — Z01810 Encounter for preprocedural cardiovascular examination: Secondary | ICD-10-CM | POA: Diagnosis not present

## 2023-03-22 DIAGNOSIS — E119 Type 2 diabetes mellitus without complications: Secondary | ICD-10-CM | POA: Diagnosis not present

## 2023-03-22 DIAGNOSIS — Z01812 Encounter for preprocedural laboratory examination: Secondary | ICD-10-CM | POA: Insufficient documentation

## 2023-03-22 DIAGNOSIS — Z01818 Encounter for other preprocedural examination: Secondary | ICD-10-CM | POA: Diagnosis present

## 2023-03-22 HISTORY — DX: Other complications of anesthesia, initial encounter: T88.59XA

## 2023-03-22 HISTORY — DX: Fatty (change of) liver, not elsewhere classified: K76.0

## 2023-03-22 LAB — URINALYSIS, ROUTINE W REFLEX MICROSCOPIC
Bilirubin Urine: NEGATIVE
Glucose, UA: NEGATIVE mg/dL
Hgb urine dipstick: NEGATIVE
Ketones, ur: NEGATIVE mg/dL
Nitrite: NEGATIVE
Protein, ur: 100 mg/dL — AB
Specific Gravity, Urine: 1.012 (ref 1.005–1.030)
pH: 5 (ref 5.0–8.0)

## 2023-03-22 LAB — CBC WITH DIFFERENTIAL/PLATELET
Abs Immature Granulocytes: 0.04 10*3/uL (ref 0.00–0.07)
Basophils Absolute: 0.1 10*3/uL (ref 0.0–0.1)
Basophils Relative: 1 %
Eosinophils Absolute: 0.3 10*3/uL (ref 0.0–0.5)
Eosinophils Relative: 4 %
HCT: 34.1 % — ABNORMAL LOW (ref 39.0–52.0)
Hemoglobin: 11.4 g/dL — ABNORMAL LOW (ref 13.0–17.0)
Immature Granulocytes: 1 %
Lymphocytes Relative: 28 %
Lymphs Abs: 2.1 10*3/uL (ref 0.7–4.0)
MCH: 28.7 pg (ref 26.0–34.0)
MCHC: 33.4 g/dL (ref 30.0–36.0)
MCV: 85.9 fL (ref 80.0–100.0)
Monocytes Absolute: 0.7 10*3/uL (ref 0.1–1.0)
Monocytes Relative: 10 %
Neutro Abs: 4.1 10*3/uL (ref 1.7–7.7)
Neutrophils Relative %: 56 %
Platelets: 255 10*3/uL (ref 150–400)
RBC: 3.97 MIL/uL — ABNORMAL LOW (ref 4.22–5.81)
RDW: 13.3 % (ref 11.5–15.5)
WBC: 7.3 10*3/uL (ref 4.0–10.5)
nRBC: 0 % (ref 0.0–0.2)

## 2023-03-22 LAB — COMPREHENSIVE METABOLIC PANEL
ALT: 24 U/L (ref 0–44)
AST: 28 U/L (ref 15–41)
Albumin: 3.7 g/dL (ref 3.5–5.0)
Alkaline Phosphatase: 81 U/L (ref 38–126)
Anion gap: 9 (ref 5–15)
BUN: 25 mg/dL — ABNORMAL HIGH (ref 8–23)
CO2: 25 mmol/L (ref 22–32)
Calcium: 9.2 mg/dL (ref 8.9–10.3)
Chloride: 105 mmol/L (ref 98–111)
Creatinine, Ser: 1.07 mg/dL (ref 0.61–1.24)
GFR, Estimated: >60 mL/min (ref >60)
Glucose, Bld: 122 mg/dL — ABNORMAL HIGH (ref 70–99)
Potassium: 4 mmol/L (ref 3.5–5.1)
Sodium: 139 mmol/L (ref 135–145)
Total Bilirubin: 0.6 mg/dL (ref 0.3–1.2)
Total Protein: 6.9 g/dL (ref 6.5–8.1)

## 2023-03-22 LAB — TYPE AND SCREEN
ABO/RH(D): O POS
Antibody Screen: NEGATIVE

## 2023-03-22 LAB — SURGICAL PCR SCREEN
MRSA, PCR: NEGATIVE
Staphylococcus aureus: NEGATIVE

## 2023-03-22 NOTE — Patient Instructions (Addendum)
Your procedure is scheduled on: 04/03/23 - Monday Report to the Registration Desk on the 1st floor of the [facility]. To find out your arrival time, please call [phone] between 1PM - 3PM on: 03/31/23 Friday If your arrival time is 6:00 am, do not arrive before that time as the [facility] entrance doors do not open until 6:00 am.  REMEMBER: Instructions that are not followed completely may result in serious medical risk, up to and including death; or upon the discretion of your surgeon and anesthesiologist your surgery may need to be rescheduled.  Do not eat food after midnight the night before surgery.  No gum chewing or hard candies.  You may however, drink water up to 2 hours before you are scheduled to arrive for your surgery. Do not drink anything within 2 hours of your scheduled arrival time.  In addition, your doctor has ordered for you to drink the provided:  Gatorade G2 Drinking this carbohydrate drink up to two hours before surgery helps to reduce insulin resistance and improve patient outcomes. Please complete drinking 2 hours before scheduled arrival time.  One week prior to surgery: Stop Anti-inflammatories (NSAIDS) such as Advil, Aleve, Ibuprofen, Motrin, Naproxen, Naprosyn and Aspirin based products such as Excedrin, Goody's Powder, BC Powder.  Stop ANY OVER THE COUNTER supplements until after surgery.  You may however, continue to take Tylenol if needed for pain up until the day of surgery.  Continue taking all prescribed medications with the exception of the following:  Hold metFORMIN (GLUCOPHAGE) starting 04/01/23. LANTUS  3.  HOLD TRUiICITY 7 days before your surgery 4.   HOLD amLODipine-benazepril (LOTREL) on the day of surgery  TAKE ONLY THESE MEDICATIONS THE MORNING OF SURGERY WITH A SIP OF WATER:    Lyrica   No Alcohol for 24 hours before or after surgery.  No Smoking including e-cigarettes for 24 hours before surgery.  No chewable tobacco  products for at least 6 hours before surgery.  No nicotine patches on the day of surgery.  Do not use any "recreational" drugs for at least a week (preferably 2 weeks) before your surgery.  Please be advised that the combination of cocaine and anesthesia may have negative outcomes, up to and including death. If you test positive for cocaine, your surgery will be cancelled.  On the morning of surgery brush your teeth with toothpaste and water, you may rinse your mouth with mouthwash if you wish. Do not swallow any toothpaste or mouthwash.  Use CHG Soap or wipes as directed on instruction sheet.  Do not wear jewelry, make-up, hairpins, clips or nail polish.  Do not wear lotions, powders, or perfumes.   Do not shave body hair from the neck down 48 hours before surgery.  Contact lenses, hearing aids and dentures may not be worn into surgery.  Do not bring valuables to the hospital. [facility] is not responsible for any missing/lost belongings or valuables.   Notify your doctor if there is any change in your medical condition (cold, fever, infection).  Wear comfortable clothing (specific to your surgery type) to the hospital.  After surgery, you can help prevent lung complications by doing breathing exercises.  Take deep breaths and cough every 1-2 hours. Your doctor may order a device called an Incentive Spirometer to help you take deep breaths. When coughing or sneezing, hold a pillow firmly against your incision with both hands. This is called "splinting." Doing this helps protect your incision. It also decreases belly discomfort.  If you are being admitted to the hospital overnight, leave your suitcase in the car. After surgery it may be brought to your room.  In case of increased patient census, it may be necessary for you, the patient, to continue your postoperative care in the Same Day Surgery department.  If you are being discharged the day of surgery, you will not be allowed to  drive home. You will need a responsible individual to drive you home and stay with you for 24 hours after surgery.   If you are taking public transportation, you will need to have a responsible individual with you.  Please call the Pre-admissions Testing Dept. at [phone] if you have any questions about these instructions.  Surgery Visitation Policy:  Patients having surgery or a procedure may have two visitors.  Children under the age of 40 must have an adult with them who is not the patient.  Inpatient Visitation:    Visiting hours are 7 a.m. to 8 p.m. Up to four visitors are allowed at one time in a patient room. The visitors may rotate out with other people during the day.  One visitor age 74 or older may stay with the patient overnight and must be in the room by 8 p.m.    Pre-operative 5 CHG Bath Instructions   You can play a key role in reducing the risk of infection after surgery. Your skin needs to be as free of germs as possible. You can reduce the number of germs on your skin by washing with CHG (chlorhexidine gluconate) soap before surgery. CHG is an antiseptic soap that kills germs and continues to kill germs even after washing.   DO NOT use if you have an allergy to chlorhexidine/CHG or antibacterial soaps. If your skin becomes reddened or irritated, stop using the CHG and notify one of our RNs at [phone].   Please shower with the CHG soap starting 4 days before surgery using the following schedule: 08/22 - 08/26.    Please keep in mind the following:  DO NOT shave, including legs and underarms, starting the day of your first shower.   You may shave your face at any point before/day of surgery.  Place clean sheets on your bed the day you start using CHG soap. Use a clean washcloth (not used since being washed) for each shower. DO NOT sleep with pets once you start using the CHG.   CHG Shower Instructions:  If you choose to wash your hair and private area,  wash first with your normal shampoo/soap.  After you use shampoo/soap, rinse your hair and body thoroughly to remove shampoo/soap residue.  Turn the water OFF and apply about 3 tablespoons (45 ml) of CHG soap to a CLEAN washcloth.  Apply CHG soap ONLY FROM YOUR NECK DOWN TO YOUR TOES (washing for 3-5 minutes)  DO NOT use CHG soap on face, private areas, open wounds, or sores.  Pay special attention to the area where your surgery is being performed.  If you are having back surgery, having someone wash your back for you may be helpful. Wait 2 minutes after CHG soap is applied, then you may rinse off the CHG soap.  Pat dry with a clean towel  Put on clean clothes/pajamas   If you choose to wear lotion, please use ONLY the CHG-compatible lotions on the back of this paper.     Additional instructions for the day of surgery: DO NOT APPLY any lotions, deodorants, cologne, or perfumes.  Put on clean/comfortable clothes.  Brush your teeth.  Ask your nurse before applying any prescription medications to the skin.      CHG Compatible Lotions   Aveeno Moisturizing lotion  Cetaphil Moisturizing Cream  Cetaphil Moisturizing Lotion  Clairol Herbal Essence Moisturizing Lotion, Dry Skin  Clairol Herbal Essence Moisturizing Lotion, Extra Dry Skin  Clairol Herbal Essence Moisturizing Lotion, Normal Skin  Curel Age Defying Therapeutic Moisturizing Lotion with Alpha Hydroxy  Curel Extreme Care Body Lotion  Curel Soothing Hands Moisturizing Hand Lotion  Curel Therapeutic Moisturizing Cream, Fragrance-Free  Curel Therapeutic Moisturizing Lotion, Fragrance-Free  Curel Therapeutic Moisturizing Lotion, Original Formula  Eucerin Daily Replenishing Lotion  Eucerin Dry Skin Therapy Plus Alpha Hydroxy Crme  Eucerin Dry Skin Therapy Plus Alpha Hydroxy Lotion  Eucerin Original Crme  Eucerin Original Lotion  Eucerin Plus Crme Eucerin Plus Lotion  Eucerin TriLipid Replenishing Lotion  Keri  Anti-Bacterial Hand Lotion  Keri Deep Conditioning Original Lotion Dry Skin Formula Softly Scented  Keri Deep Conditioning Original Lotion, Fragrance Free Sensitive Skin Formula  Keri Lotion Fast Absorbing Fragrance Free Sensitive Skin Formula  Keri Lotion Fast Absorbing Softly Scented Dry Skin Formula  Keri Original Lotion  Keri Skin Renewal Lotion Keri Silky Smooth Lotion  Keri Silky Smooth Sensitive Skin Lotion  Nivea Body Creamy Conditioning Oil  Nivea Body Extra Enriched Lotion  Nivea Body Original Lotion  Nivea Body Sheer Moisturizing Lotion Nivea Crme  Nivea Skin Firming Lotion  NutraDerm 30 Skin Lotion  NutraDerm Skin Lotion  NutraDerm Therapeutic Skin Cream  NutraDerm Therapeutic Skin Lotion  ProShield Protective Hand Cream  Provon moisturizing lotion How to Use an Incentive Spirometer  An incentive spirometer is a tool that measures how well you are filling your lungs with each breath. Learning to take long, deep breaths using this tool can help you keep your lungs clear and active. This may help to reverse or lessen your chance of developing breathing (pulmonary) problems, especially infection. You may be asked to use a spirometer: After a surgery. If you have a lung problem or a history of smoking. After a long period of time when you have been unable to move or be active. If the spirometer includes an indicator to show the highest number that you have reached, your health care provider or respiratory therapist will help you set a goal. Keep a log of your progress as told by your health care provider. What are the risks? Breathing too quickly may cause dizziness or cause you to pass out. Take your time so you do not get dizzy or light-headed. If you are in pain, you may need to take pain medicine before doing incentive spirometry. It is harder to take a deep breath if you are having pain. How to use your incentive spirometer  Sit up on the edge of your bed or on a  chair. Hold the incentive spirometer so that it is in an upright position. Before you use the spirometer, breathe out normally. Place the mouthpiece in your mouth. Make sure your lips are closed tightly around it. Breathe in slowly and as deeply as you can through your mouth, causing the piston or the ball to rise toward the top of the chamber. Hold your breath for 3-5 seconds, or for as long as possible. If the spirometer includes a coach indicator, use this to guide you in breathing. Slow down your breathing if the indicator goes above the marked areas. Remove the mouthpiece from your mouth and breathe out normally.  The piston or ball will return to the bottom of the chamber. Rest for a few seconds, then repeat the steps 10 or more times. Take your time and take a few normal breaths between deep breaths so that you do not get dizzy or light-headed. Do this every 1-2 hours when you are awake. If the spirometer includes a goal marker to show the highest number you have reached (best effort), use this as a goal to work toward during each repetition. After each set of 10 deep breaths, cough a few times. This will help to make sure that your lungs are clear. If you have an incision on your chest or abdomen from surgery, place a pillow or a rolled-up towel firmly against the incision when you cough. This can help to reduce pain while taking deep breaths and coughing. General tips When you are able to get out of bed: Walk around often. Continue to take deep breaths and cough in order to clear your lungs. Keep using the incentive spirometer until your health care provider says it is okay to stop using it. If you have been in the hospital, you may be told to keep using the spirometer at home. Contact a health care provider if: You are having difficulty using the spirometer. You have trouble using the spirometer as often as instructed. Your pain medicine is not giving enough relief for you to use the  spirometer as told. You have a fever. Get help right away if: You develop shortness of breath. You develop a cough with bloody mucus from the lungs. You have fluid or blood coming from an incision site after you cough. Summary An incentive spirometer is a tool that can help you learn to take long, deep breaths to keep your lungs clear and active. You may be asked to use a spirometer after a surgery, if you have a lung problem or a history of smoking, or if you have been inactive for a long period of time. Use your incentive spirometer as instructed every 1-2 hours while you are awake. If you have an incision on your chest or abdomen, place a pillow or a rolled-up towel firmly against your incision when you cough. This will help to reduce pain. Get help right away if you have shortness of breath, you cough up bloody mucus, or blood comes from your incision when you cough. This information is not intended to replace advice given to you by your health care provider. Make sure you discuss any questions you have with your health care provider. Document Revised: 10/14/2019 Document Reviewed: 10/14/2019 Elsevier Patient Education  2023 Elsevier Inc.  POLAR CARE INFORMATION  MassAdvertisement.it  How to use [facility] Therapy [facility]?  YouTube   ShippingScam.co.uk  OPERATING INSTRUCTIONS  Start the product With dry hands, connect the transformer to the electrical connection located on the top of the cooler. Next, plug the transformer into an appropriate electrical outlet. The unit will automatically start running at this point.  To stop the pump, disconnect electrical power.  Unplug to stop the product when not in use. Unplugging the Polar Care unit turns it off. Always unplug immediately after use. Never leave it plugged in while unattended. Remove pad.    FIRST ADD WATER TO FILL LINE, THEN ICE---Replace ice when existing ice is almost melted  1 Discuss  Treatment with your Licensed Health Care Practitioner and Use Only as Prescribed 2 Apply Insulation Barrier & Cold Therapy Pad 3 Check for Moisture  4 Inspect Skin Regularly  Tips and Conservation officer, historic buildings Tips 1. Use cubed or chunked ice for optimal performance. 2. It is recommended to drain the Pad between uses. To drain the pad, hold the Pad upright with the hose pointed toward the ground. Depress the black plunger and allow water to drain out. 3. You may disconnect the Pad from the unit without removing the pad from the affected area by depressing the silver tabs on the hose coupling and gently pulling the hoses apart. The Pad and unit will seal itself and will not leak. Note: Some dripping during release is normal. 4. DO NOT RUN PUMP WITHOUT WATER! The pump in this unit is designed to run with water. Running the unit without water will cause permanent damage to the pump. 5. Unplug unit before removing lid.  TROUBLESHOOTING GUIDE Pump not running, Water not flowing to the pad, Pad is not getting cold 1. Make sure the transformer is plugged into the wall outlet. 2. Confirm that the ice and water are filled to the indicated levels. 3. Make sure there are no kinks in the pad. 4. Gently pull on the blue tube to make sure the tube/pad junction is straight. 5. Remove the pad from the treatment site and ll it while the pad is lying at; then reapply. 6. Confirm that the pad couplings are securely attached to the unit. Listen for the double clicks (Figure 1) to confirm the pad couplings are securely attached.  Leaks    Note: Some condensation on the lines, controller, and pads is unavoidable, especially in warmer climates. 1. If using a [employer] Polar Care Cold Therapy unit with a detachable Cold Therapy Pad, and a leak exists (other than condensation on the lines) disconnect the pad couplings. Make sure the silver tabs on the couplings are depressed before reconnecting the pad to the pump hose; then  confirm both sides of the coupling are properly clicked in. 2. If the coupling continues to leak or a leak is detected in the pad itself, stop using it and call [employer] Customer Care at [phone].  Cleaning After use, empty and dry the unit with a soft cloth. Warm water and mild detergent may be used occasionally to clean the pump and tubes.  WARNING: The Polar Care Cube can be cold enough to cause serious injury, including full skin necrosis. Follow these Operating Instructions, and carefully read the Product Insert (see pouch on side of unit) and the Cold Therapy Pad Fitting Instructions (provided with each Cold Therapy Pad) prior to use.

## 2023-03-29 ENCOUNTER — Encounter: Payer: Self-pay | Admitting: Orthopedic Surgery

## 2023-04-02 MED ORDER — CEFAZOLIN SODIUM-DEXTROSE 2-4 GM/100ML-% IV SOLN
2.0000 g | INTRAVENOUS | Status: AC
  Administered 2023-04-03: 2 g via INTRAVENOUS

## 2023-04-02 MED ORDER — TRANEXAMIC ACID-NACL 1000-0.7 MG/100ML-% IV SOLN
1000.0000 mg | INTRAVENOUS | Status: AC
  Administered 2023-04-03: 1000 mg via INTRAVENOUS

## 2023-04-02 MED ORDER — DEXAMETHASONE SODIUM PHOSPHATE 10 MG/ML IJ SOLN
8.0000 mg | Freq: Once | INTRAMUSCULAR | Status: AC
  Administered 2023-04-03: 8 mg via INTRAVENOUS

## 2023-04-02 MED ORDER — CHLORHEXIDINE GLUCONATE 0.12 % MT SOLN
15.0000 mL | Freq: Once | OROMUCOSAL | Status: AC
  Administered 2023-04-03: 15 mL via OROMUCOSAL

## 2023-04-02 MED ORDER — ORAL CARE MOUTH RINSE: 15.0000 mL | Freq: Once | OROMUCOSAL | Status: AC

## 2023-04-03 ENCOUNTER — Observation Stay
Admission: RE | Admit: 2023-04-03 | Discharge: 2023-04-04 | Disposition: A | Discharge status: Home Health | Payer: Medicare Other | Attending: Orthopedic Surgery | Admitting: Orthopedic Surgery

## 2023-04-03 ENCOUNTER — Other Ambulatory Visit: Payer: Self-pay

## 2023-04-03 ENCOUNTER — Encounter: Payer: Self-pay | Admitting: Orthopedic Surgery

## 2023-04-03 ENCOUNTER — Ambulatory Visit: Payer: Medicare Other | Admitting: Urgent Care

## 2023-04-03 ENCOUNTER — Encounter
Admission: RE | Disposition: A | Discharge status: Home Health | Payer: Self-pay | Source: Home / Self Care | Attending: Orthopedic Surgery

## 2023-04-03 ENCOUNTER — Ambulatory Visit: Payer: Medicare Other

## 2023-04-03 DIAGNOSIS — Z7984 Long term (current) use of oral hypoglycemic drugs: Secondary | ICD-10-CM | POA: Insufficient documentation

## 2023-04-03 DIAGNOSIS — E119 Type 2 diabetes mellitus without complications: Secondary | ICD-10-CM | POA: Insufficient documentation

## 2023-04-03 DIAGNOSIS — I10 Essential (primary) hypertension: Secondary | ICD-10-CM | POA: Diagnosis not present

## 2023-04-03 DIAGNOSIS — M1712 Unilateral primary osteoarthritis, left knee: Principal | ICD-10-CM | POA: Insufficient documentation

## 2023-04-03 DIAGNOSIS — Z85828 Personal history of other malignant neoplasm of skin: Secondary | ICD-10-CM | POA: Insufficient documentation

## 2023-04-03 DIAGNOSIS — Z794 Long term (current) use of insulin: Secondary | ICD-10-CM | POA: Diagnosis not present

## 2023-04-03 DIAGNOSIS — Z79899 Other long term (current) drug therapy: Secondary | ICD-10-CM | POA: Insufficient documentation

## 2023-04-03 DIAGNOSIS — Z96652 Presence of left artificial knee joint: Principal | ICD-10-CM

## 2023-04-03 HISTORY — DX: Benign prostatic hyperplasia without lower urinary tract symptoms: N40.0

## 2023-04-03 HISTORY — DX: Unspecified malignant neoplasm of skin of nose: C44.301

## 2023-04-03 HISTORY — PX: TOTAL KNEE ARTHROPLASTY: SHX125

## 2023-04-03 HISTORY — DX: Essential (primary) hypertension: I10

## 2023-04-03 LAB — ABO/RH: ABO/RH(D): O POS

## 2023-04-03 LAB — GLUCOSE, CAPILLARY
Glucose-Capillary: 160 mg/dL — ABNORMAL HIGH (ref 70–99)
Glucose-Capillary: 173 mg/dL — ABNORMAL HIGH (ref 70–99)
Glucose-Capillary: 242 mg/dL — ABNORMAL HIGH (ref 70–99)
Glucose-Capillary: 259 mg/dL — ABNORMAL HIGH (ref 70–99)

## 2023-04-03 SURGERY — ARTHROPLASTY, KNEE, TOTAL
Anesthesia: Spinal | Site: Knee | Laterality: Left

## 2023-04-03 MED ORDER — FINASTERIDE 5 MG PO TABS
5.0000 mg | ORAL_TABLET | Freq: Every day | ORAL | Status: DC
  Administered 2023-04-03 – 2023-04-04 (×2): 5 mg via ORAL
  Filled 2023-04-03 (×2): qty 1

## 2023-04-03 MED ORDER — OXYCODONE HCL 5 MG/5ML PO SOLN: 5.0000 mg | Freq: Once | ORAL | Status: DC | PRN

## 2023-04-03 MED ORDER — HYDROCODONE-ACETAMINOPHEN 5-325 MG PO TABS: 1.0000 | ORAL_TABLET | ORAL | Status: DC | PRN

## 2023-04-03 MED ORDER — TRANEXAMIC ACID-NACL 1000-0.7 MG/100ML-% IV SOLN
1000.0000 mg | Freq: Once | INTRAVENOUS | Status: AC
  Administered 2023-04-03: 1000 mg via INTRAVENOUS

## 2023-04-03 MED ORDER — STERILE WATER FOR IRRIGATION IR SOLN
Status: DC | PRN
Start: 2023-04-03 — End: 2023-04-03
  Administered 2023-04-03: 1000 mL

## 2023-04-03 MED ORDER — AMLODIPINE BESYLATE 5 MG PO TABS
ORAL_TABLET | ORAL | Status: AC
  Filled 2023-04-03: qty 2

## 2023-04-03 MED ORDER — TRANEXAMIC ACID-NACL 1000-0.7 MG/100ML-% IV SOLN
INTRAVENOUS | Status: AC
  Filled 2023-04-03: qty 100

## 2023-04-03 MED ORDER — PROPOFOL 500 MG/50ML IV EMUL
INTRAVENOUS | Status: DC | PRN
  Administered 2023-04-03: 75 ug/kg/min via INTRAVENOUS

## 2023-04-03 MED ORDER — BENAZEPRIL HCL 20 MG PO TABS
40.0000 mg | ORAL_TABLET | Freq: Every day | ORAL | Status: DC
  Administered 2023-04-03 – 2023-04-04 (×2): 40 mg via ORAL
  Filled 2023-04-03 (×2): qty 2

## 2023-04-03 MED ORDER — KETOROLAC TROMETHAMINE 15 MG/ML IJ SOLN
INTRAMUSCULAR | Status: AC
  Filled 2023-04-03: qty 1

## 2023-04-03 MED ORDER — BUPIVACAINE HCL (PF) 0.5 % IJ SOLN
INTRAMUSCULAR | Status: AC
  Filled 2023-04-03: qty 10

## 2023-04-03 MED ORDER — AMLODIPINE BESYLATE 5 MG PO TABS
10.0000 mg | ORAL_TABLET | Freq: Every day | ORAL | Status: DC
  Administered 2023-04-03 – 2023-04-04 (×2): 10 mg via ORAL

## 2023-04-03 MED ORDER — ACETAMINOPHEN 10 MG/ML IV SOLN
INTRAVENOUS | Status: DC | PRN
  Administered 2023-04-03: 1000 mg via INTRAVENOUS

## 2023-04-03 MED ORDER — FAMOTIDINE 20 MG PO TABS
ORAL_TABLET | ORAL | Status: AC
  Filled 2023-04-03: qty 1

## 2023-04-03 MED ORDER — CEFAZOLIN SODIUM-DEXTROSE 2-4 GM/100ML-% IV SOLN
2.0000 g | Freq: Four times a day (QID) | INTRAVENOUS | Status: AC
  Administered 2023-04-03 (×2): 2 g via INTRAVENOUS
  Filled 2023-04-03 (×2): qty 100

## 2023-04-03 MED ORDER — SODIUM CHLORIDE 0.9 % IV SOLN: INTRAVENOUS | Status: DC

## 2023-04-03 MED ORDER — FERROUS SULFATE 325 (65 FE) MG PO TABS
324.0000 mg | ORAL_TABLET | Freq: Every day | ORAL | Status: DC
  Administered 2023-04-04: 324 mg via ORAL

## 2023-04-03 MED ORDER — DOCUSATE SODIUM 100 MG PO CAPS
ORAL_CAPSULE | ORAL | Status: AC
  Filled 2023-04-03: qty 1

## 2023-04-03 MED ORDER — GABAPENTIN 300 MG PO CAPS
ORAL_CAPSULE | ORAL | Status: AC
  Filled 2023-04-03: qty 1

## 2023-04-03 MED ORDER — INSULIN ASPART 100 UNIT/ML IJ SOLN
0.0000 [IU] | Freq: Three times a day (TID) | INTRAMUSCULAR | Status: DC
  Administered 2023-04-04 (×2): 3 [IU] via SUBCUTANEOUS

## 2023-04-03 MED ORDER — BUPIVACAINE LIPOSOME 1.3 % IJ SUSP
INTRAMUSCULAR | Status: AC
  Filled 2023-04-03: qty 20

## 2023-04-03 MED ORDER — MENTHOL 3 MG MT LOZG: 1.0000 | LOZENGE | OROMUCOSAL | Status: DC | PRN

## 2023-04-03 MED ORDER — MAGNESIUM OXIDE -MG SUPPLEMENT 400 (240 MG) MG PO TABS
400.0000 mg | ORAL_TABLET | Freq: Every day | ORAL | Status: DC
  Administered 2023-04-04: 400 mg via ORAL
  Filled 2023-04-03: qty 1

## 2023-04-03 MED ORDER — CEFAZOLIN SODIUM-DEXTROSE 2-4 GM/100ML-% IV SOLN
INTRAVENOUS | Status: AC
  Filled 2023-04-03: qty 100

## 2023-04-03 MED ORDER — OXYCODONE HCL 5 MG PO TABS: 5.0000 mg | ORAL_TABLET | Freq: Once | ORAL | Status: DC | PRN

## 2023-04-03 MED ORDER — CHLORHEXIDINE GLUCONATE 0.12 % MT SOLN
OROMUCOSAL | Status: AC
  Filled 2023-04-03: qty 15

## 2023-04-03 MED ORDER — ACETAMINOPHEN 500 MG PO TABS
1000.0000 mg | ORAL_TABLET | Freq: Three times a day (TID) | ORAL | Status: AC
  Administered 2023-04-03 – 2023-04-04 (×3): 1000 mg via ORAL

## 2023-04-03 MED ORDER — BUPIVACAINE HCL (PF) 0.5 % IJ SOLN
INTRAMUSCULAR | Status: DC | PRN
  Administered 2023-04-03: 2.5 mL via INTRATHECAL

## 2023-04-03 MED ORDER — ONDANSETRON HCL 4 MG/2ML IJ SOLN: 4.0000 mg | Freq: Four times a day (QID) | INTRAMUSCULAR | Status: DC | PRN

## 2023-04-03 MED ORDER — SODIUM CHLORIDE (PF) 0.9 % IJ SOLN
INTRAMUSCULAR | Status: DC | PRN
  Administered 2023-04-03: 71 mL

## 2023-04-03 MED ORDER — FENTANYL CITRATE (PF) 100 MCG/2ML IJ SOLN
INTRAMUSCULAR | Status: AC
  Filled 2023-04-03: qty 2

## 2023-04-03 MED ORDER — ACETAMINOPHEN 500 MG PO TABS
ORAL_TABLET | ORAL | Status: AC
  Filled 2023-04-03: qty 2

## 2023-04-03 MED ORDER — FAMOTIDINE 20 MG PO TABS
20.0000 mg | ORAL_TABLET | Freq: Once | ORAL | Status: AC
  Administered 2023-04-03: 20 mg via ORAL

## 2023-04-03 MED ORDER — FENTANYL CITRATE (PF) 100 MCG/2ML IJ SOLN: 25.0000 ug | INTRAMUSCULAR | Status: DC | PRN

## 2023-04-03 MED ORDER — ENOXAPARIN SODIUM 30 MG/0.3ML IJ SOSY
30.0000 mg | PREFILLED_SYRINGE | Freq: Two times a day (BID) | INTRAMUSCULAR | Status: DC
  Administered 2023-04-04: 30 mg via SUBCUTANEOUS

## 2023-04-03 MED ORDER — METOCLOPRAMIDE HCL 5 MG/ML IJ SOLN: 5.0000 mg | Freq: Three times a day (TID) | INTRAMUSCULAR | Status: DC | PRN

## 2023-04-03 MED ORDER — DEXAMETHASONE SODIUM PHOSPHATE 10 MG/ML IJ SOLN
INTRAMUSCULAR | Status: AC
  Filled 2023-04-03: qty 1

## 2023-04-03 MED ORDER — ACETAMINOPHEN 10 MG/ML IV SOLN
INTRAVENOUS | Status: AC
  Filled 2023-04-03: qty 100

## 2023-04-03 MED ORDER — GABAPENTIN 100 MG PO CAPS
ORAL_CAPSULE | ORAL | Status: AC
  Filled 2023-04-03: qty 1

## 2023-04-03 MED ORDER — PROPOFOL 1000 MG/100ML IV EMUL
INTRAVENOUS | Status: AC
  Filled 2023-04-03: qty 100

## 2023-04-03 MED ORDER — INSULIN ASPART 100 UNIT/ML IJ SOLN
INTRAMUSCULAR | Status: AC
  Filled 2023-04-03: qty 1

## 2023-04-03 MED ORDER — ONDANSETRON HCL 4 MG/2ML IJ SOLN
INTRAMUSCULAR | Status: DC | PRN
  Administered 2023-04-03: 4 mg via INTRAVENOUS

## 2023-04-03 MED ORDER — METOCLOPRAMIDE HCL 5 MG PO TABS: 5.0000 mg | ORAL_TABLET | Freq: Three times a day (TID) | ORAL | Status: DC | PRN

## 2023-04-03 MED ORDER — INSULIN ASPART 100 UNIT/ML IJ SOLN
0.0000 [IU] | Freq: Every day | INTRAMUSCULAR | Status: DC
  Administered 2023-04-03: 3 [IU] via SUBCUTANEOUS

## 2023-04-03 MED ORDER — FENOFIBRATE 54 MG PO TABS
54.0000 mg | ORAL_TABLET | Freq: Every day | ORAL | Status: DC
  Administered 2023-04-03 – 2023-04-04 (×2): 54 mg via ORAL
  Filled 2023-04-03 (×2): qty 1

## 2023-04-03 MED ORDER — PANTOPRAZOLE SODIUM 40 MG PO TBEC
DELAYED_RELEASE_TABLET | ORAL | Status: AC
  Filled 2023-04-03: qty 1

## 2023-04-03 MED ORDER — ACETAMINOPHEN 10 MG/ML IV SOLN: 1000.0000 mg | Freq: Once | INTRAVENOUS | Status: DC | PRN

## 2023-04-03 MED ORDER — TAMSULOSIN HCL 0.4 MG PO CAPS
0.4000 mg | ORAL_CAPSULE | Freq: Every day | ORAL | Status: DC
  Administered 2023-04-03 – 2023-04-04 (×2): 0.4 mg via ORAL

## 2023-04-03 MED ORDER — ONDANSETRON HCL 4 MG/2ML IJ SOLN
INTRAMUSCULAR | Status: AC
  Filled 2023-04-03: qty 2

## 2023-04-03 MED ORDER — MIDAZOLAM HCL 5 MG/5ML IJ SOLN
INTRAMUSCULAR | Status: DC | PRN
  Administered 2023-04-03: 2 mg via INTRAVENOUS

## 2023-04-03 MED ORDER — TRAMADOL HCL 50 MG PO TABS
ORAL_TABLET | ORAL | Status: AC
  Filled 2023-04-03: qty 1

## 2023-04-03 MED ORDER — BUPIVACAINE-EPINEPHRINE (PF) 0.25% -1:200000 IJ SOLN
INTRAMUSCULAR | Status: AC
  Filled 2023-04-03: qty 30

## 2023-04-03 MED ORDER — DOCUSATE SODIUM 100 MG PO CAPS
100.0000 mg | ORAL_CAPSULE | Freq: Two times a day (BID) | ORAL | Status: DC
  Administered 2023-04-03 – 2023-04-04 (×2): 100 mg via ORAL

## 2023-04-03 MED ORDER — SURGIPHOR WOUND IRRIGATION SYSTEM - OPTIME
TOPICAL | Status: DC | PRN
  Administered 2023-04-03: 450 mL via TOPICAL

## 2023-04-03 MED ORDER — PANTOPRAZOLE SODIUM 40 MG PO TBEC
40.0000 mg | DELAYED_RELEASE_TABLET | Freq: Every day | ORAL | Status: DC
  Administered 2023-04-03 – 2023-04-04 (×2): 40 mg via ORAL

## 2023-04-03 MED ORDER — LIDOCAINE HCL (PF) 2 % IJ SOLN
INTRAMUSCULAR | Status: AC
  Filled 2023-04-03: qty 5

## 2023-04-03 MED ORDER — EZETIMIBE 10 MG PO TABS
ORAL_TABLET | ORAL | Status: AC
  Filled 2023-04-03: qty 1

## 2023-04-03 MED ORDER — LACTATED RINGERS IV SOLN
INTRAVENOUS | Status: DC | PRN
Start: 2023-04-03 — End: 2023-04-03

## 2023-04-03 MED ORDER — INSULIN GLARGINE-YFGN 100 UNIT/ML ~~LOC~~ SOLN
35.0000 [IU] | Freq: Every day | SUBCUTANEOUS | Status: DC
  Administered 2023-04-03: 35 [IU] via SUBCUTANEOUS
  Filled 2023-04-03 (×2): qty 0.35

## 2023-04-03 MED ORDER — TRAMADOL HCL 50 MG PO TABS
50.0000 mg | ORAL_TABLET | Freq: Four times a day (QID) | ORAL | Status: DC | PRN
  Administered 2023-04-03: 50 mg via ORAL

## 2023-04-03 MED ORDER — EZETIMIBE 10 MG PO TABS
10.0000 mg | ORAL_TABLET | Freq: Every day | ORAL | Status: DC
  Administered 2023-04-03 – 2023-04-04 (×2): 10 mg via ORAL

## 2023-04-03 MED ORDER — GABAPENTIN 300 MG PO CAPS
400.0000 mg | ORAL_CAPSULE | Freq: Three times a day (TID) | ORAL | Status: DC
  Administered 2023-04-03 – 2023-04-04 (×3): 400 mg via ORAL

## 2023-04-03 MED ORDER — SODIUM CHLORIDE 0.9 % IR SOLN
Status: DC | PRN
  Administered 2023-04-03: 3000 mL

## 2023-04-03 MED ORDER — ONDANSETRON HCL 4 MG/2ML IJ SOLN: 4.0000 mg | Freq: Once | INTRAMUSCULAR | Status: DC | PRN

## 2023-04-03 MED ORDER — MIDAZOLAM HCL 2 MG/2ML IJ SOLN
INTRAMUSCULAR | Status: AC
  Filled 2023-04-03: qty 2

## 2023-04-03 MED ORDER — ACETAMINOPHEN 500 MG PO TABS: 1000.0000 mg | ORAL_TABLET | Freq: Three times a day (TID) | ORAL | Status: DC

## 2023-04-03 MED ORDER — KETOROLAC TROMETHAMINE 15 MG/ML IJ SOLN
7.5000 mg | Freq: Four times a day (QID) | INTRAMUSCULAR | Status: AC
  Administered 2023-04-03 – 2023-04-04 (×4): 7.5 mg via INTRAVENOUS

## 2023-04-03 MED ORDER — KETOROLAC TROMETHAMINE 30 MG/ML IJ SOLN
INTRAMUSCULAR | Status: AC
  Filled 2023-04-03: qty 1

## 2023-04-03 MED ORDER — MORPHINE SULFATE (PF) 4 MG/ML IV SOLN: 0.5000 mg | INTRAVENOUS | Status: DC | PRN

## 2023-04-03 MED ORDER — SODIUM CHLORIDE FLUSH 0.9 % IV SOLN
INTRAVENOUS | Status: AC
  Filled 2023-04-03: qty 20

## 2023-04-03 MED ORDER — PHENOL 1.4 % MT LIQD: 1.0000 | OROMUCOSAL | Status: DC | PRN

## 2023-04-03 MED ORDER — TAMSULOSIN HCL 0.4 MG PO CAPS
ORAL_CAPSULE | ORAL | Status: AC
  Filled 2023-04-03: qty 1

## 2023-04-03 MED ORDER — ONDANSETRON HCL 4 MG PO TABS: 4.0000 mg | ORAL_TABLET | Freq: Four times a day (QID) | ORAL | Status: DC | PRN

## 2023-04-03 SURGICAL SUPPLY — 78 items
ADH SKN CLS APL DERMABOND .7 (GAUZE/BANDAGES/DRESSINGS) ×1
APL PRP STRL LF DISP 70% ISPRP (MISCELLANEOUS) ×2
BLADE PATELLA W-PILOT HOLE 35 (BLADE) IMPLANT
BLADE SAGITTAL AGGR TOOTH XLG (BLADE) IMPLANT
BLADE SAW SAG 25X90X1.19 (BLADE) ×1 IMPLANT
BLADE SAW SAG 29X58X.64 (BLADE) ×1 IMPLANT
BOWL CEMENT MIX W/ADAPTER (MISCELLANEOUS) ×1 IMPLANT
BSPLAT TIB 5D G CMNT STM LT (Knees) ×1 IMPLANT
CEMENT BONE R 1X40 (Cement) ×2 IMPLANT
CHLORAPREP W/TINT 26 (MISCELLANEOUS) ×2 IMPLANT
COOLER POLAR GLACIER W/PUMP (MISCELLANEOUS) ×1 IMPLANT
CUFF TOURN SGL QUICK 24 (TOURNIQUET CUFF)
CUFF TOURN SGL QUICK 30 (TOURNIQUET CUFF) ×1
CUFF TRNQT CYL 24X4X16.5-23 (TOURNIQUET CUFF) IMPLANT
CUFF TRNQT CYL 30X4X21-28X (TOURNIQUET CUFF) IMPLANT
DERMABOND ADVANCED .7 DNX12 (GAUZE/BANDAGES/DRESSINGS) ×1 IMPLANT
DRAPE INCISE IOBAN 66X60 STRL (DRAPES) IMPLANT
DRAPE SHEET LG 3/4 BI-LAMINATE (DRAPES) ×1 IMPLANT
DRSG MEPILEX SACRM 8.7X9.8 (GAUZE/BANDAGES/DRESSINGS) ×1 IMPLANT
DRSG OPSITE POSTOP 4X10 (GAUZE/BANDAGES/DRESSINGS) IMPLANT
DRSG OPSITE POSTOP 4X8 (GAUZE/BANDAGES/DRESSINGS) IMPLANT
ELECT REM PT RETURN 9FT ADLT (ELECTROSURGICAL) ×1
ELECTRODE REM PT RTRN 9FT ADLT (ELECTROSURGICAL) ×1 IMPLANT
GLOVE BIO SURGEON STRL SZ8 (GLOVE) ×1 IMPLANT
GLOVE BIOGEL PI IND STRL 8 (GLOVE) ×1 IMPLANT
GLOVE PI ORTHO PRO STRL 7.5 (GLOVE) ×2 IMPLANT
GLOVE PI ORTHO PRO STRL SZ8 (GLOVE) ×2 IMPLANT
GLOVE SURG SYN 7.5 E (GLOVE) ×1 IMPLANT
GLOVE SURG SYN 7.5 PF PI (GLOVE) ×1 IMPLANT
GOWN SRG XL LVL 3 NONREINFORCE (GOWNS) ×1 IMPLANT
GOWN STRL NON-REIN TWL XL LVL3 (GOWNS) ×1
GOWN STRL REUS W/ TWL LRG LVL3 (GOWN DISPOSABLE) ×1 IMPLANT
GOWN STRL REUS W/ TWL XL LVL3 (GOWN DISPOSABLE) ×1 IMPLANT
GOWN STRL REUS W/TWL LRG LVL3 (GOWN DISPOSABLE) ×1
GOWN STRL REUS W/TWL XL LVL3 (GOWN DISPOSABLE) ×1
HANDLE YANKAUER SUCT OPEN TIP (MISCELLANEOUS) ×1 IMPLANT
HOOD PEEL AWAY T7 (MISCELLANEOUS) ×2 IMPLANT
INSERT ARTISURF S8-11 18X22X14 (Insert) IMPLANT
IV NS IRRIG 3000ML ARTHROMATIC (IV SOLUTION) ×1 IMPLANT
KIT TURNOVER KIT A (KITS) ×1 IMPLANT
MANIFOLD NEPTUNE II (INSTRUMENTS) ×1 IMPLANT
MARKER SKIN DUAL TIP RULER LAB (MISCELLANEOUS) ×1 IMPLANT
MAT ABSORB FLUID 56X50 GRAY (MISCELLANEOUS) ×1 IMPLANT
NDL FILTER BLUNT 18X1 1/2 (NEEDLE) ×1 IMPLANT
NDL HYPO 21X1.5 SAFETY (NEEDLE) ×1 IMPLANT
NDL SAFETY ECLIP 18X1.5 (MISCELLANEOUS) ×1 IMPLANT
NEEDLE FILTER BLUNT 18X1 1/2 (NEEDLE) ×1 IMPLANT
NEEDLE HYPO 21X1.5 SAFETY (NEEDLE) ×1 IMPLANT
PACK TOTAL KNEE (MISCELLANEOUS) ×1 IMPLANT
PAD ARMBOARD 7.5X6 YLW CONV (MISCELLANEOUS) ×3 IMPLANT
PAD WRAPON POLAR KNEE (MISCELLANEOUS) ×1 IMPLANT
PENCIL SMOKE EVACUATOR (MISCELLANEOUS) ×1 IMPLANT
PIN DRILL HDLS TROCAR 75 4PK (PIN) IMPLANT
PSN FEM CR CMT CCR STD SZ10 L (Joint) ×1 IMPLANT
PULSAVAC PLUS IRRIG FAN TIP (DISPOSABLE) ×1
SCREW FEMALE HEX FIX 25X2.5 (ORTHOPEDIC DISPOSABLE SUPPLIES) IMPLANT
SCREW HEX HEADED 3.5X27 DISP (ORTHOPEDIC DISPOSABLE SUPPLIES) IMPLANT
SLEEVE SCD COMPRESS KNEE MED (STOCKING) ×1 IMPLANT
SOLUTION IRRIG SURGIPHOR (IV SOLUTION) ×1 IMPLANT
STEM POLY PAT PLY 35M KNEE (Knees) IMPLANT
STEM TIB ST PERS 14+30 (Stem) IMPLANT
STEM TIBIA 5 DEG SZ G L KNEE (Knees) IMPLANT
SURFACE ARTC PRSNA CCR SZ10 L (Joint) IMPLANT
SUT DVC 2 QUILL PDO T11 36X36 (SUTURE) ×1 IMPLANT
SUT QUILL MONODERM 3-0 PS-2 (SUTURE) ×1 IMPLANT
SUT VIC AB 0 CT1 36 (SUTURE) ×1 IMPLANT
SUT VIC AB 2-0 CT2 27 (SUTURE) ×2 IMPLANT
SUT VICRYL 1-0 27IN ABS (SUTURE) ×1
SUTURE VICRYL 1-0 27IN ABS (SUTURE) ×1 IMPLANT
SYR 30ML LL (SYRINGE) ×2 IMPLANT
SYR TB 1ML LL NO SAFETY (SYRINGE) ×1 IMPLANT
TAPE CLOTH 3X10 WHT NS LF (GAUZE/BANDAGES/DRESSINGS) ×1 IMPLANT
TIBIA STEM 5 DEG SZ G L KNEE (Knees) ×1 IMPLANT
TIP FAN IRRIG PULSAVAC PLUS (DISPOSABLE) ×1 IMPLANT
TOWEL OR 17X26 4PK STRL BLUE (TOWEL DISPOSABLE) IMPLANT
TRAP FLUID SMOKE EVACUATOR (MISCELLANEOUS) ×1 IMPLANT
WATER STERILE IRR 1000ML POUR (IV SOLUTION) ×1 IMPLANT
WRAPON POLAR PAD KNEE (MISCELLANEOUS) ×1

## 2023-04-03 NOTE — Anesthesia Preprocedure Evaluation (Signed)
Anesthesia Evaluation  Patient identified by MRN, date of birth, ID band Patient awake    Reviewed: Allergy & Precautions, NPO status , Patient's Chart, lab work & pertinent test results  History of Anesthesia Complications Negative for: history of anesthetic complications  Airway Mallampati: II  TM Distance: >3 FB Neck ROM: Full    Dental  (+) Chipped   Pulmonary neg pulmonary ROS, neg sleep apnea, neg COPD, Patient abstained from smoking.Not current smoker   Pulmonary exam normal breath sounds clear to auscultation       Cardiovascular Exercise Tolerance: Good METShypertension, Pt. on medications (-) CAD and (-) Past MI (-) dysrhythmias  Rhythm:Regular Rate:Normal - Systolic murmurs    Neuro/Psych negative neurological ROS  negative psych ROS   GI/Hepatic ,neg GERD  ,,(+)     (-) substance abuse    Endo/Other  diabetes, Well Controlled  Off of GLP1 agonist for over a week  Renal/GU negative Renal ROS     Musculoskeletal  (+) Arthritis ,    Abdominal   Peds  Hematology   Anesthesia Other Findings Past Medical History: 06/25/2015: Arthritis No date: BPH (benign prostatic hyperplasia) No date: Complication of anesthesia     Comment:  has anxiety about "needles going in the back" doesnt               want a spinal No date: Fatty liver 06/25/2015: H/O varicella 06/25/2015: H/O: gout No date: HTN (hypertension) No date: Hyperlipidemia No date: Lymphedema No date: Sickle cell trait (HCC) No date: Skin cancer of nose 06/25/2015: Type 2 diabetes mellitus (HCC)  Reproductive/Obstetrics                             Anesthesia Physical Anesthesia Plan  ASA: 2  Anesthesia Plan: Spinal   Post-op Pain Management: Ofirmev IV (intra-op)*   Induction: Intravenous  PONV Risk Score and Plan: 1 and Ondansetron, Dexamethasone, Propofol infusion, TIVA and Midazolam  Airway Management  Planned: Natural Airway  Additional Equipment: None  Intra-op Plan:   Post-operative Plan:   Informed Consent: I have reviewed the patients History and Physical, chart, labs and discussed the procedure including the risks, benefits and alternatives for the proposed anesthesia with the patient or authorized representative who has indicated his/her understanding and acceptance.       Plan Discussed with: CRNA and Surgeon  Anesthesia Plan Comments: (Patient is nervous about prospect of a spinal, says he had a bad experience before. On further inquiry, he says it's because the actual spinal placement was painful for him. I assured him he would get plenty of local anesthetic in the skin, as well as some IV sedation and analgesia, and that we would not continue with  the procedure until he was sufficiently comfortable. Patient is OK with this.  Discussed R/B/A of neuraxial anesthesia technique with patient: - rare risks of spinal/epidural hematoma, nerve damage, infection - Risk of PDPH - Risk of nausea and vomiting - Risk of conversion to general anesthesia and its associated risks, including sore throat, damage to lips/eyes/teeth/oropharynx, and rare risks such as cardiac and respiratory events. - Risk of allergic reactions  Discussed the role of CRNA in patient's perioperative care.  Patient voiced understanding.)       Anesthesia Quick Evaluation

## 2023-04-03 NOTE — H&P (Signed)
History of Present Illness: The patient is an 72 y.o. male for history and physical for left total knee arthroplasty with Dr. Audelia Acton on 04/03/2023. Patient reports he has had pain in his left knee for years with multiple injuries years ago when he was in the [employer]. He is undergone at least 4 surgeries in the knee including 2 meniscus and ACL surgeries as well as a lateral ligament injury repair. Patient reports constant pain when walking or trying to move his left knee with episodes of giving way and sharp pain over his anterior knee. He reports difficulty going from sitting to standing or going up and down steps which limits his ability to get around. He takes Tylenol for the pain with some relief. He reports his pain gets up to a 9 out of 10 at its worst and has been slowly worsening over time without any improvement from conservative measures. He has undergone multiple cortisone and gel injection series and the last shot in 2023 only gave him a few weeks of relief. The patient denies fevers, chills, numbness, tingling, shortness of breath, chest pain, recent illness, or any trauma.  Patient does have a history of lymphedema which she goes to physical therapy for for wrapping but it is localized primarily to his calf and feet with minimal swelling over his knees. He does have a history of a toe infection on the left side which was treated with hyperbaric treatments and has fully healed. 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 Patient uses SCDs at home to help with swelling of the left leg.  Patient is a non-smoker with a BMI of 36.6 and hemoglobin A1c of 6.5 well-controlled with medications.  Past Medical History: Past Medical History:  Diagnosis Date  Arthritis  History of chicken pox  History of gout  History of vitamin D deficiency  Hyperlipidemia  Hypertension  Sickle cell trait (CMS-HCC)  Type 2 diabetes mellitus (CMS/HHS-HCC)    Past Surgical History: Past Surgical History:  Procedure Laterality Date  ARTHROSCOPIC REPAIR ACL Left 1988  After left knee arthroscopy. -- x3  KNEE ARTHROSCOPY Left 1988  HERNIA REPAIR 2006  [facility]  ROUX-EN-Y PROCEDURE 2007  KNEE ARTHROSCOPY Left 11/18/10  Partial lateral meniscectomy, debridement of torn anterior cruciate ligament graft.  REPAIR UMBILICAL HERNIA 10/05/2017  Dr Renda Rolls  HERNIA REPAIR Left  Inguinal hernia  TONSILLECTOMY AND ADENOIDECTOMY   Past Family History: Family History  Problem Relation Age of Onset  COPD Mother  Cancer Father   Medications: Current Outpatient Medications  Medication Sig Dispense Refill  amLODIPine-benazepril (LOTREL) 10-40 mg capsule Take 1 capsule by mouth once daily 90 capsule 3  amoxicillin (AMOXIL) 500 MG capsule Take 500 mg by mouth 2 (two) times daily  blood glucose meter kit 4 (four) times daily for 180 days 1 each 0  blood sugar diagnostic, disc (BLOOD GLUCOSE DIAGNOSTIC, DISC) test strip Use 4 (four) times daily for 180 days Use as instructed. 400 each 0  cholecalciferol (VITAMIN D3) 1000 unit tablet Take 1,000 Units by mouth once daily.   cyanocobalamin (VITAMIN B12) 1000 MCG tablet Take 1 tablet (1,000 mcg total) by mouth once daily 90 tablet 1  dulaglutide (TRULICITY) 3 mg/0.5 mL subcutaneous pen injector Inject 0.5 mLs (3 mg total) subcutaneously once a week 6 mL 2  ezetimibe (ZETIA) 10 mg tablet Take 1 tablet (10 mg total) by mouth once daily 90 tablet 3  fenofibrate nanocrystallized (TRICOR) 48 MG tablet Take  1 tablet (48 mg total) by mouth once daily 90 tablet 3  ferrous sulfate 325 (65 FE) MG tablet Take 1 tablet (325 mg total) by mouth daily with breakfast 90 tablet 1  finasteride (PROSCAR) 5 mg tablet TAKE 1 TABLET DAILY 90 tablet 3  FUROsemide (LASIX) 20 MG tablet TAKE 1 TO 2 TABLETS DAILY 180 tablet 3  glipiZIDE (GLUCOTROL) 10 MG tablet TAKE 1 TABLET TWICE A DAY BEFORE MEALS 180 tablet 3  insulin GLARGINE  (LANTUS U-100 INSULIN) injection (concentration 100 units/mL) Inject 40 Units subcutaneously at bedtime 19 mL 5  lancing device with lancets kit Use 1 each 2 (two) times daily for 180 days 400 each 1  metFORMIN (GLUCOPHAGE) 500 MG tablet Take 1 tablet (500 mg total) by mouth 2 (two) times daily with meals 180 tablet 3  pregabalin (LYRICA) 150 MG capsule Take 1 capsule (150 mg total) by mouth 2 (two) times daily 180 capsule 3  tamsulosin (FLOMAX) 0.4 mg capsule Take 1 capsule (0.4 mg total) by mouth once daily Take 30 minutes after same meal each day. 90 capsule 3  terbinafine HCL (LAMISIL AT) 1 % cream Apply topically 2 (two) times daily Use on the feet for two weeks. 15 g 2  tiZANidine (ZANAFLEX) 2 MG tablet Take 1 tablet (2 mg total) by mouth 3 (three) times daily as needed 30 tablet 2  ZINC ACETATE ORAL Take by mouth   No current facility-administered medications for this visit.   Allergies: Allergies  Allergen Reactions  Betamethasone Dipropionate Rash and Other (See Comments)  Patient had a rash and turned into a wound and hot and burned.  Statins-Hmg-Coa Reductase Inhibitors Unknown  Myalgias, muscle weakness    Visit Vitals: Vitals:  03/29/23 1057  BP: 138/72    Review of Systems:  A comprehensive 14 point ROS was performed, reviewed, and the pertinent orthopaedic findings are documented in the HPI.  Physical Exam: General:  Well developed, well nourished, no apparent distress, normal affect, antalgic gait with a cane  HEENT: Head normocephalic, atraumatic, PERRL.   Abdomen: Soft, non tender, non distended, Bowel sounds present.  Heart: Examination of the heart reveals regular, rate, and rhythm. There is no murmur noted on ascultation. There is a normal apical pulse.  Lungs: Lungs are clear to auscultation. There is no wheeze, rhonchi, or crackles. There is normal expansion of bilateral chest walls.   Comprehensive Knee Exam: Gait Antalgic on the left with a cane   Alignment Valgus on the left mild thrust   Inspection Right Left  Skin Normal appearance with no obvious deformity. No ecchymosis or erythema. Normal appearance with no obvious deformity. Healed scars over the knee including a midline incision and a medial incision. No signs of infection, no ecchymosis or erythema.  Soft Tissue There is nonfocal swelling from the mid calf distal to the foot with some mild pitting. No significant swelling at the level of the knee There is nonfocal swelling from the mid calf distal to the foot with some mild pitting. No significant swelling at the level of the knee. Minimal erythema lower anterior distal tibia, no warmth. No drainage.  Quad Atrophy None None   Palpation  Right Left  Tenderness No peripatellar, patellar tendon, quad tendon, medial/lateral joint line pain Lateral joint line and parapatellar tenderness to palpation  Crepitus No patellofemoral or tibiofemoral crepitus + patellofemoral and tibiofemoral crepitus  Effusion None None   Range of Motion  Right      Left  Flexion 0-115    5-90  Extension Full knee extension without hyperextension Full knee extension without hyperextension   Ligamentous Exam Right Left  Lachman Normal Normal  Valgus 0 Normal Normal  Valgus 30 Normal Normal  Varus 0 Normal Partially correctable with an endpoint  Varus 30 Normal Normal  Anterior Drawer Normal Normal  Posterior Drawer Normal Normal   Meniscal Exam Right Left  Hyperflexion Test Negative Positive  Hyperextension Test Negative Positive  McMurray's Negative Positive   Neurovascular Right Left  Quadriceps Strength 5/5 5/5  Hamstring Strength 5/5 5/5  Hip Abductor Strength 4/5 4/5  Distal Motor Normal Normal  Distal Sensory Normal light touch sensation Normal light touch sensation  Distal Pulses Normal Normal    Imaging Studies: I reviewed AP, lateral, sunrise, and flexed PA weightbearing x-rays of the left knee taken on 01/18/2023  images reviewed by myself. There are severe degenerative changes in the left knee with lateral joint space narrowing with bone-on-bone articulation, sclerosis, osteophyte formation, subchondral cyst formation is also patella. Patient is status post what appears to be ACL reconstruction with metallic screw in the central tibia and the lateral femur. There is also a staple over the lateral epicondyles consistent with a possible previous ligament repair. No acute appearing fractures or dislocations noted.  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 He has had no improvement with conservative treatment. He said years of debilitating pain that affecting his quality of life and activities a living. He is agreed and consented to a left total knee arthroplasty with Dr. Audelia Acton on 04/03/2023. Risk, benefits, complications of a left total knee arthroplasty have been discussed with the patient today in the office.  The hospitalization and post-operative care and rehabilitation were also discussed. The use of perioperative antibiotics and DVT prophylaxis were discussed. The risk, benefits and alternatives to a surgical intervention were discussed at length with the patient. The patient was also advised of risks related to the medical comorbidities and elevated body mass index (BMI). A lengthy discussion took place to review the most common complications including but not limited to: stiffness, loss of function, complex regional pain syndrome, deep vein thrombosis, pulmonary embolus, heart attack, stroke, infection, wound breakdown, numbness, intraoperative fracture, damage to nerves, tendon,muscles, arteries or other blood vessels, death and other possible complications from anesthesia. The patient was told that we will take steps to minimize these risks by using sterile technique, antibiotics and DVT prophylaxis when  appropriate and follow the patient postoperatively in the office setting to monitor progress. The possibility of recurrent pain, no improvement in pain and actual worsening of pain were also discussed with the patient.  I reviewed the surgical incision with the patient, he has three prior incisions and we will plan to use most of his previous midline incision. Discussed the increased risk of wound healing with his prior incisions. We also discussed his pre-existing lymphedema and the increased risk of wound complications and infection given his recurrent swelling. The patient acknowledges the increased risk and agrees with plan to proceed.   All questions answered, patient received clearance for surgery. Agrees with above plan to proceed with left total knee arthroplasty.

## 2023-04-03 NOTE — Anesthesia Procedure Notes (Signed)
Spinal  Patient location during procedure: OR Start time: 04/03/2023 10:14 AM End time: 04/03/2023 10:19 AM Reason for block: surgical anesthesia Staffing Performed: resident/CRNA  Resident/CRNA: Nelle Don, CRNA Performed by: Nelle Don, CRNA Authorized by: Corinda Gubler, MD   Preanesthetic Checklist Completed: patient identified, IV checked, site marked, risks and benefits discussed, surgical consent, monitors and equipment checked and pre-op evaluation Spinal Block Patient position: sitting Prep: ChloraPrep Patient monitoring: heart rate, continuous pulse ox and blood pressure Approach: midline Location: L3-4 Injection technique: single-shot Needle Needle type: Pencan  Needle gauge: 24 G Needle length: 10 cm Additional Notes Expiration date of kit checked. Clear CSF flow prior to injection. Dr. Suzan Slick present

## 2023-04-03 NOTE — Anesthesia Procedure Notes (Signed)
Procedure Name: MAC Date/Time: 04/03/2023 10:14 AM  Performed by: Nelle Don, CRNAPre-anesthesia Checklist: Patient identified, Emergency Drugs available, Suction available and Patient being monitored Oxygen Delivery Method: Simple face mask

## 2023-04-03 NOTE — Evaluation (Signed)
Physical Therapy Evaluation Patient Details Name: Gene Brown MRN: [id number] DOB: Dec 26, 1950 Today's Date: 04/03/2023  History of Present Illness  Pt is a 72 y.o. male s/p L TKA secondary to OA 04/03/23.  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  Clinical Impression  Prior to surgery, pt was modified independent ambulating with SPC; lives alone in 1 level home with 3 STE (no railing); pt's neighbor plans to assist pt with meals/housework; h/o falls; pt reports R knee soreness since walking a mile on Saturday with his cane (R knee soreness mostly when bending his knee).  L knee pain 5/10 at rest beginning of session and 4-5/10 at rest end of session (nurse reports recent pain medication).  Currently pt is min assist semi-supine to sitting EOB; min assist to stand on 3rd attempt standing from bed (with some use of momentum); and CGA to ambulate a few feet bed to recliner with RW use.  Pt would currently benefit from skilled PT to address noted impairments and functional limitations (see below for any additional details).  Upon hospital discharge, pt would benefit from ongoing therapy (pt reports wanting to discharge home with support of his neighbor).     If plan is discharge home, recommend the following: A little help with walking and/or transfers;A little help with bathing/dressing/bathroom;Assistance with cooking/housework;Assist for transportation;Help with stairs or ramp for entrance   Can travel by private vehicle    Yes    Equipment Recommendations Rolling walker (2 wheels)  Recommendations for Other Services  OT consult    Functional Status Assessment Patient has had a recent decline in their functional status and demonstrates the ability to make significant improvements in function in a reasonable and predictable amount of time.     Precautions / Restrictions Precautions Precautions:  Knee;Fall Precaution Booklet Issued: Yes (comment) Restrictions Weight Bearing Restrictions: Yes LLE Weight Bearing: Weight bearing as tolerated      Mobility  Bed Mobility Overal bed mobility: Needs Assistance Bed Mobility: Supine to Sit     Supine to sit: Min assist, HOB elevated     General bed mobility comments: assist for L LE; vc's for technique; use of bed rail    Transfers Overall transfer level: Needs assistance Equipment used: Rolling walker (2 wheels) Transfers: Sit to/from Stand Sit to Stand: Min assist           General transfer comment: pt requiring 3 attempts to stand (able to stand on 3rd with some use of momentum); vc's for UE/LE placement and overall technique    Ambulation/Gait Ambulation/Gait assistance: Contact guard assist Gait Distance (Feet): 3 Feet (bed to recliner) Assistive device: Rolling walker (2 wheels)   Gait velocity: decreased     General Gait Details: antalgic; decreased stance time L LE; vc's for walker use and overall gait technique  Stairs            Wheelchair Mobility     Tilt Bed    Modified Rankin (Stroke Patients Only)       Balance Overall balance assessment: Needs assistance Sitting-balance support: No upper extremity supported, Feet supported Sitting balance-Leahy Scale: Good Sitting balance - Comments: steady reaching within BOS   Standing balance support: Bilateral upper extremity supported, During functional activity, Reliant on assistive device for balance Standing balance-Leahy Scale: Good Standing balance comment: steady taking steps with RW use  Pertinent Vitals/Pain Pain Assessment Pain Assessment: 0-10 Pain Score: 5  Pain Location: L knee Pain Descriptors / Indicators: Sore, Aching Pain Intervention(s): Limited activity within patient's tolerance, Monitored during session, Premedicated before session, Repositioned, Other (comment) (polar care  applied/activated) Vitals (HR and SpO2 on room air) stable and WFL throughout treatment session.    Home Living Family/patient expects to be discharged to:: Private residence Living Arrangements: Alone Available Help at Discharge: Neighbor;Available PRN/intermittently Type of Home: House Home Access: Stairs to enter Entrance Stairs-Rails: None Entrance Stairs-Number of Steps: 3   Home Layout: One level Home Equipment: Cane - single point;Grab bars - tub/shower;BSC/3in1 Additional Comments: Neighbor plans to assist with meals and housework.    Prior Function Prior Level of Function : Independent/Modified Independent             Mobility Comments: Modified independent ambulating with SPC.  1 recent fall backwards in kitchen (2 falls total in last 90 days).       Extremity/Trunk Assessment   Upper Extremity Assessment Upper Extremity Assessment: Overall WFL for tasks assessed    Lower Extremity Assessment Lower Extremity Assessment: LLE deficits/detail (R LE WFL) LLE Deficits / Details: minimal assist for L LE SLR; at least 3/5 AROM hip flexion and ankle DF/PF LLE: Unable to fully assess due to pain    Cervical / Trunk Assessment Cervical / Trunk Assessment: Normal  Communication   Communication Communication:  (Pt HOH) Cueing Techniques: Verbal cues;Visual cues;Gestural cues  Cognition Arousal: Alert Behavior During Therapy: WFL for tasks assessed/performed Overall Cognitive Status: Within Functional Limits for tasks assessed                                          General Comments General comments (skin integrity, edema, etc.): L knee dressing in place.  Nursing cleared pt for participation in physical therapy.  Pt agreeable to PT session.    Exercises Total Joint Exercises Ankle Circles/Pumps: AROM, Strengthening, Both, 10 reps, Supine Quad Sets: AROM, Strengthening, Left, 10 reps, Supine Heel Slides: AAROM, Strengthening, Left, 10 reps,  Supine Hip ABduction/ADduction: AAROM, Strengthening, Left, 10 reps, Supine Straight Leg Raises: AAROM, Strengthening, Left, 10 reps, Supine Goniometric ROM: L knee AROM 5 degrees short of neutral to 70 degrees flexion   Assessment/Plan    PT Assessment Patient needs continued PT services  PT Problem List Decreased strength;Decreased range of motion;Decreased activity tolerance;Decreased balance;Decreased mobility;Decreased knowledge of use of DME;Decreased knowledge of precautions;Decreased skin integrity;Pain       PT Treatment Interventions DME instruction;Gait training;Stair training;Functional mobility training;Therapeutic activities;Therapeutic exercise;Balance training;Patient/family education    PT Goals (Current goals can be found in the Care Plan section)  Acute Rehab PT Goals Patient Stated Goal: to improve pain and mobility PT Goal Formulation: With patient Time For Goal Achievement: 04/17/23 Potential to Achieve Goals: Good    Frequency BID     Co-evaluation               AM-PAC PT "6 Clicks" Mobility  Outcome Measure Help needed turning from your back to your side while in a flat bed without using bedrails?: None Help needed moving from lying on your back to sitting on the side of a flat bed without using bedrails?: A Little Help needed moving to and from a bed to a chair (including a wheelchair)?: A Little Help needed standing up from a chair using your arms (  e.g., wheelchair or bedside chair)?: A Little Help needed to walk in hospital room?: A Little Help needed climbing 3-5 steps with a railing? : A Lot 6 Click Score: 18    End of Session Equipment Utilized During Treatment: Gait belt Activity Tolerance: Patient tolerated treatment well Patient left: in chair;with call bell/phone within reach;with SCD's reapplied;Other (comment) (polar care in place L knee; L heel floating via towel roll) Nurse Communication: Precautions;Mobility status;Weight bearing  status;Other (comment) (Pt's pain status) PT Visit Diagnosis: Other abnormalities of gait and mobility (R26.89);Muscle weakness (generalized) (M62.81);History of falling (Z91.81);Pain Pain - Right/Left: Left Pain - part of body: Knee    Time: 1610-9604 PT Time Calculation (min) (ACUTE ONLY): 34 min   Charges:   PT Evaluation $PT Eval Low Complexity: 1 Low PT Treatments $Therapeutic Exercise: 8-22 mins PT General Charges $$ ACUTE PT VISIT: 1 Visit        Hendricks Limes, PT 04/03/23, 4:13 PM

## 2023-04-03 NOTE — Interval H&P Note (Signed)
Patient history and physical updated. Consent reviewed including risks, benefits, and alternatives to surgery. Patient agrees with above plan to proceed with left total knee arthroplasty.

## 2023-04-03 NOTE — Inpatient Diabetes Management (Signed)
Inpatient Diabetes Program Recommendations  AACE/ADA: New Consensus Statement on Inpatient Glycemic Control (2015)  Target Ranges:  Prepandial:   less than 140 mg/dL      Peak postprandial:   less than 180 mg/dL (1-2 hours)      Critically ill patients:  140 - 180 mg/dL    Latest Reference Range & Units 04/03/23 09:46 04/03/23 12:46  Glucose-Capillary 70 - 99 mg/dL 644 (H)  8 mg Decadron @1029  173 (H)  (H): Data is abnormally high   Admit for: Left Total Knee Arthroplasty   History: DM  Home DM Meds: Trulicity Qweek        Glipizide 10 mg BID        Lantus 40 units at bedtime        Metformin 500 mg BID  Current Orders: None yet     ENDO: Dr. Tedd Sias with [facility] Last Seen 01/03/2023 Instructions Given: Increase Trulicity to 3 mg Qweek Continue Lantus, Metformin, and Glipizide    MD- Please consider for in-hospital insulin regimen:  1. Start Semglee 35 units at bedtime (85% home dose)  2. Start Novolog Moderate Correction Scale/ SSI (0-15 units) TID AC + HS     --Will follow patient during hospitalization--  Ambrose Finland RN, MSN, CDCES Diabetes Coordinator Inpatient Glycemic Control Team Team Pager: [phone] (8a-5p)

## 2023-04-03 NOTE — Transfer of Care (Signed)
Immediate Anesthesia Transfer of Care Note  Patient: Gene Brown  Procedure(s) Performed: TOTAL KNEE ARTHROPLASTY (Left: Knee)  Patient Location: PACU  Anesthesia Type:Spinal  Level of Consciousness: drowsy  Airway & Oxygen Therapy: Patient Spontanous Breathing and Patient connected to face mask oxygen  Post-op Assessment: Report given to RN and Post -op Vital signs reviewed and stable  Post vital signs: Reviewed and stable  Last Vitals:  Vitals Value Taken Time  BP 131/62 04/03/23 1241  Temp 36.2 C 04/03/23 1241  Pulse 70 04/03/23 1243  Resp 13 04/03/23 1243  SpO2 100 % 04/03/23 1243  Vitals shown include unfiled device data.  Last Pain:  Vitals:   04/03/23 0926  TempSrc: Oral         Complications: No notable events documented.

## 2023-04-03 NOTE — Op Note (Signed)
Patient Name: Gene Brown  AYT:[id number]  Pre-Operative Diagnosis: Left knee Osteoarthritis  Post-Operative Diagnosis: (same)  Procedure: Left Total Knee Arthroplasty  Components/Implants: Femur: Persona Size 10 CR   Tibia: Persona Size G w/ 14x1mm stem extension  Poly: 10mm MC  Patella: 35x35mm symmetric  Femoral Valgus Cut Angle: 5 degrees  Distal Femoral Re-cut: none  Patella Resurfacing: yes   Date of Surgery: 04/03/2023  Surgeon: Reinaldo Berber MD  Assistant: Amador Cunas PA (present and scrubbed throughout the case, critical for assistance with exposure, retraction, instrumentation, and closure)   Anesthesiologist: Suzan Slick  Anesthesia: Spinal   Tourniquet Time: 77 min  EBL: 50cc  IVF: 1000cc  Complications: None   Brief history: The patient is a 72 year old male with a history of osteoarthritis of the left knee with pain limiting their range of motion and activities of daily living, which has failed multiple attempts at conservative therapy.  The risks and benefits of total knee arthroplasty as definitive surgical treatment were discussed with the patient, who opted to proceed with the operation.  After outpatient medical clearance and optimization was completed the patient was admitted to [facility] for the procedure.  All preoperative films were reviewed and an appropriate surgical plan was made prior to surgery. Preoperative range of motion was 5 to 90 with a 5 degree flexion contracture. The patient was identified as having a Valgus alignment.   Description of procedure: The patient was brought to the operating room where laterality was confirmed by all those present to be the left side.   Spinal anesthesia was administered and the patient received an intravenous dose of antibiotics for surgical prophylaxis and a dose of tranexamic acid.  Patient is positioned supine on the operating room table with all bony prominences well-padded.  A  well-padded tourniquet was applied to the left thigh.  The knee was then prepped and draped in usual sterile fashion with multiple layers of adhesive and nonadhesive drapes.  All of those present in the operating room participated in a surgical timeout laterality and patient were confirmed.   An Esmarch was wrapped around the extremity and the leg was elevated and the knee flexed.  The tourniquet was inflated to a pressure of 275 mmHg. The Esmarch was removed and the leg was brought down to full extension.  The patella and tibial tubercle identified and outlined using a marking pen and a midline skin incision was made with a knife carried through the subcutaneous tissue down to the extensor retinaculum.  After exposure of the extensor mechanism the medial parapatellar arthrotomy was performed with a scalpel and electrocautery extending down medial and distal to the tibial tubercle taking care to avoid incising the patellar tendon.   A standard medial release was performed over the proximal tibia.  The knee was brought into extension in order to excise the fat pad taking care not to damage the patella tendon.  The superior soft tissue was removed from the anterior surface of the distal femur to visualize for the procedure.  The knee was then brought into flexion with the patella subluxed laterally and subluxing the tibia anteriorly.  The ACL was transected and removed with electrocautery and additional soft tissue was removed from the proximal surface of the tibia to fully expose. The PCL was found to be intact with significant scar overgrowth contracting the posterior knee so the majority of the PCL tissue was carefully resected off of the intercondylar notch and the proximal surface of the tibia.  An extramedullary tibial cutting guide was then applied to the leg with a spring-loaded ankle clamp placed around the distal tibia just above the malleoli the angulation of the guide was adjusted to give some  posterior slope in the tibial resection with an appropriate varus/valgus alignment.  The resection guide was then pinned to the proximal tibia and the proximal tibial surface was resected with an oscillating saw.  Careful attention was paid to ensure the blade did not disrupt any of the soft tissues including any lateral or medial ligament.  Attention was then turned to the femur, with the knee slightly flexed a opening drill was used to enter the medullary canal of the femur.  After removing the drill marrow was suctioned out to decompress the distal femur.  An intramedullary femoral guide was then inserted into the drill hole and the alignment guide was seated firmly against the distal end of the medial femoral condyle.  The distal femoral cutting guide was then attached and pinned securely to the anterior surface of the femur and the intramedullary rod and alignment guide was removed.  Distal femur resection was then performed with an oscillating saw with retractors protecting medial and laterally.   The distal cutting block was then removed and the extension gap was checked with a spacer.  Extension gap was found to be appropriately sized to accommodate the spacer block.   The femoral sizing guide was then placed securely into the posterior condyles of the femur and the femoral size was measured and determined to be 10.  The size 10; 4-in-1 cutting guide was placed in position and secured with 2 pins.  The anterior posterior and chamfer resections were then performed with an oscillating saw.  Bony fragments and osteophytes were then removed.  Using a lamina spreader the posterior medial and lateral condyles were checked for additional osteophytes and posterior soft tissue remnants.  Any remaining meniscus was removed at this time.  Periarticular injection was performed in the meniscal rims and posterior capsule with aspiration performed to ensure no intravascular injection.   The tibia was then exposed  and the tibial trial was pinned onto the plateau after confirming appropriate orientation and rotation.  A prior ACL screw in the anterior tibia was found to interfere with the planned keel of the tibial component the bone was removed over the anterior tibia on top of the screw and the screw was carefully removed leaving a small bone defect in the anterior tibia from the screw tract.  Using the drill bushing the tibia was prepared to the appropriate drill depth.  Tibial broach impactor was then driven through the punch guide using a mallet.  The femoral trial component was then inserted onto the femur. A trial tibial polyethylene bearing was then placed and the knee was reduced.  The knee achieved full extension with no hyperextension and was found to be balanced in flexion and extension with the trials in place.  The knee was then brought into full extension the patella was everted and held with 2 Kocher clamps.  The articular surface of the patella was then resected with an patella reamer and saw after careful measurement with a caliper.  The patella was then prepared with the drill guide and a trial patella was placed.  The knee was then taken through range of motion and it was found that the patella articulated appropriately with the trochlea and good patellofemoral motion without subluxation.    The correct final components for implantation were  confirmed and opened by the circulator nurse.  The prepared surfaces of the patella femur and tibia were cleaned with pulsatile lavage to remove all blood fat and other material and then the surfaces were dried.  2 bags of cement were mixed under vacuum and the components were cemented into place.  Cement was used to fill the small bone tract in the anterior tibia where the prior ACL screw was removed.  Excess cement was removed with curettes and forceps. A trial polyethylene tibial component was placed and the knee was brought into extension to allow the cement to  set.  At this time the periarticular injection cocktail was placed in the soft tissues surrounding the knee.  After full curing of the cement the balance of the knee was checked again and the final polyethylene size was confirmed. The tibial component was irrigated and locking mechanism checked to ensure it was clear of debris. The real polyethylene tibial component was implanted and the knee was brought through a range of motion.   The knee was then irrigated with copious amount of normal saline via pulsatile lavage to remove all loose bodies and other debris.  The knee was then irrigated with surgiphor betadine based wash and reirrigated with saline.  The tourniquet was then dropped and all bleeding vessels were identified and coagulated.  The arthrotomy was approximated with #1 Vicryl and closed with #2 Quill suture.  The knee was brought into slight flexion and the subcutaneous tissues were closed with 0 Vicryl, 2-0 Vicryl and a running subcuticular 3-0 monoderm quil suture.  Skin was then glued with Dermabond.  A sterile adhesive dressing was then placed along with a sequential compression device to the calf, a Ted stocking, and a cryotherapy cuff.   Sponge, needle, and Lap counts were all correct at the end of the case.   The patient was transferred off of the operating room table to a hospital bed, good pulses were found distally on the operative side.  The patient was transferred to the recovery room in stable condition.

## 2023-04-04 ENCOUNTER — Encounter: Payer: Self-pay | Admitting: Orthopedic Surgery

## 2023-04-04 DIAGNOSIS — M1712 Unilateral primary osteoarthritis, left knee: Secondary | ICD-10-CM | POA: Diagnosis not present

## 2023-04-04 LAB — BASIC METABOLIC PANEL
Anion gap: 8 (ref 5–15)
BUN: 33 mg/dL — ABNORMAL HIGH (ref 8–23)
CO2: 20 mmol/L — ABNORMAL LOW (ref 22–32)
Calcium: 8.8 mg/dL — ABNORMAL LOW (ref 8.9–10.3)
Chloride: 109 mmol/L (ref 98–111)
Creatinine, Ser: 1.32 mg/dL — ABNORMAL HIGH (ref 0.61–1.24)
GFR, Estimated: 57 mL/min — ABNORMAL LOW (ref >60)
Glucose, Bld: 194 mg/dL — ABNORMAL HIGH (ref 70–99)
Potassium: 3.8 mmol/L (ref 3.5–5.1)
Sodium: 137 mmol/L (ref 135–145)

## 2023-04-04 LAB — CBC
HCT: 32.3 % — ABNORMAL LOW (ref 39.0–52.0)
Hemoglobin: 11.3 g/dL — ABNORMAL LOW (ref 13.0–17.0)
MCH: 29.1 pg (ref 26.0–34.0)
MCHC: 35 g/dL (ref 30.0–36.0)
MCV: 83.2 fL (ref 80.0–100.0)
Platelets: 263 10*3/uL (ref 150–400)
RBC: 3.88 MIL/uL — ABNORMAL LOW (ref 4.22–5.81)
RDW: 13.3 % (ref 11.5–15.5)
WBC: 10.5 10*3/uL (ref 4.0–10.5)
nRBC: 0 % (ref 0.0–0.2)

## 2023-04-04 LAB — GLUCOSE, CAPILLARY
Glucose-Capillary: 184 mg/dL — ABNORMAL HIGH (ref 70–99)
Glucose-Capillary: 173 mg/dL — ABNORMAL HIGH (ref 70–99)

## 2023-04-04 MED ORDER — INSULIN ASPART 100 UNIT/ML IJ SOLN
INTRAMUSCULAR | Status: AC
  Filled 2023-04-04: qty 1

## 2023-04-04 MED ORDER — DOCUSATE SODIUM 100 MG PO CAPS: 100.0000 mg | ORAL_CAPSULE | Freq: Two times a day (BID) | ORAL | 0 refills | Status: AC

## 2023-04-04 MED ORDER — ONDANSETRON HCL 4 MG PO TABS: 4.0000 mg | ORAL_TABLET | Freq: Four times a day (QID) | ORAL | 0 refills | Status: AC | PRN

## 2023-04-04 MED ORDER — ACETAMINOPHEN 500 MG PO TABS
ORAL_TABLET | ORAL | Status: AC
  Filled 2023-04-04: qty 2

## 2023-04-04 MED ORDER — TAMSULOSIN HCL 0.4 MG PO CAPS
ORAL_CAPSULE | ORAL | Status: AC
  Filled 2023-04-04: qty 1

## 2023-04-04 MED ORDER — TRAMADOL HCL 50 MG PO TABS: 50.0000 mg | ORAL_TABLET | Freq: Four times a day (QID) | ORAL | 0 refills | Status: AC | PRN

## 2023-04-04 MED ORDER — KETOROLAC TROMETHAMINE 15 MG/ML IJ SOLN
INTRAMUSCULAR | Status: AC
  Filled 2023-04-04: qty 1

## 2023-04-04 MED ORDER — EZETIMIBE 10 MG PO TABS
ORAL_TABLET | ORAL | Status: AC
  Filled 2023-04-04: qty 1

## 2023-04-04 MED ORDER — AMLODIPINE BESYLATE 5 MG PO TABS
ORAL_TABLET | ORAL | Status: AC
  Filled 2023-04-04: qty 2

## 2023-04-04 MED ORDER — GABAPENTIN 300 MG PO CAPS
ORAL_CAPSULE | ORAL | Status: AC
  Filled 2023-04-04: qty 1

## 2023-04-04 MED ORDER — ENOXAPARIN SODIUM 40 MG/0.4ML IJ SOSY: 40.0000 mg | PREFILLED_SYRINGE | INTRAMUSCULAR | 0 refills | Status: AC

## 2023-04-04 MED ORDER — GABAPENTIN 100 MG PO CAPS
ORAL_CAPSULE | ORAL | Status: AC
  Filled 2023-04-04: qty 1

## 2023-04-04 MED ORDER — ENOXAPARIN SODIUM 30 MG/0.3ML IJ SOSY
PREFILLED_SYRINGE | INTRAMUSCULAR | Status: AC
  Filled 2023-04-04: qty 0.3

## 2023-04-04 MED ORDER — PANTOPRAZOLE SODIUM 40 MG PO TBEC
DELAYED_RELEASE_TABLET | ORAL | Status: AC
  Filled 2023-04-04: qty 1

## 2023-04-04 MED ORDER — FERROUS SULFATE 325 (65 FE) MG PO TABS
ORAL_TABLET | ORAL | Status: AC
  Filled 2023-04-04: qty 1

## 2023-04-04 MED ORDER — DOCUSATE SODIUM 100 MG PO CAPS
ORAL_CAPSULE | ORAL | Status: AC
  Filled 2023-04-04: qty 1

## 2023-04-04 MED ORDER — OXYCODONE HCL 5 MG PO TABS: 2.5000 mg | ORAL_TABLET | Freq: Four times a day (QID) | ORAL | 0 refills | Status: AC | PRN

## 2023-04-04 NOTE — Progress Notes (Signed)
Physical Therapy Treatment Patient Details Name: Gene Brown MRN: [id number] DOB: November 13, 1950 Today's Date: 04/04/2023   History of Present Illness Pt is a 72 y.o. male s/p L TKA secondary to OA 04/03/23.  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    PT Comments  Pt resting in recliner upon PT arrival; agreeable to therapy.  No c/o pain during session.  L knee AROM 0-90 degrees; able to perform L LE SLR independently.  Pt performed TKA LE HEP with appropriate technique/understanding and issued written HEP handout.  During session pt SBA with bed mobility; SBA with transfers using RW; CGA progressing to SBA ambulating 160 feet with RW use; and CGA navigating 4 steps with UE support.  Pt appearing steady and safe with functional mobility using RW during session.  Pt educated on home safety, fall prevention, and safe car transfer technique: pt verbalizing appropriate understanding.  Pt appears safe to discharge home when medically appropriate and has RW delivered (MD, PA, TOC, and pt's nurse notified).    If plan is discharge home, recommend the following: A little help with walking and/or transfers;A little help with bathing/dressing/bathroom;Assistance with cooking/housework;Assist for transportation;Help with stairs or ramp for entrance   Can travel by private vehicle        Equipment Recommendations  Rolling walker (2 wheels)    Recommendations for Other Services OT consult     Precautions / Restrictions Precautions Precautions: Knee;Fall Precaution Booklet Issued: Yes (comment) Restrictions Weight Bearing Restrictions: Yes LLE Weight Bearing: Weight bearing as tolerated     Mobility  Bed Mobility Overal bed mobility: Needs Assistance Bed Mobility: Supine to Sit, Sit to Supine     Supine to sit: Supervision Sit to supine: Supervision   General bed mobility comments: mild increased effort to perform  on own    Transfers Overall transfer level: Needs assistance Equipment used: Rolling walker (2 wheels) Transfers: Sit to/from Stand Sit to Stand: Supervision           General transfer comment: x1 trial from bed, x1 trial from recliner, x1 trial from toilet (use of grab bar); steady safe transfers with RW use    Ambulation/Gait Ambulation/Gait assistance: Contact guard assist, Supervision Gait Distance (Feet): 160 Feet Assistive device: Rolling walker (2 wheels) Gait Pattern/deviations: Step-through pattern Gait velocity: decreased     General Gait Details: antalgic; mild decreased stance time L LE; steady ambulating with RW use   Stairs Stairs: Yes Stairs assistance: Contact guard assist Stair Management: Step to pattern, Backwards, Forwards, With walker Number of Stairs: 4 General stair comments: ascended 4 steps with RW backwards and then descended 4 steps with RW forwards; initial vc's for LE sequencing/overall technique; pt steady and safe   Wheelchair Mobility     Tilt Bed    Modified Rankin (Stroke Patients Only)       Balance Overall balance assessment: Needs assistance Sitting-balance support: No upper extremity supported, Feet supported Sitting balance-Leahy Scale: Normal Sitting balance - Comments: steady reaching outside BOS   Standing balance support: Bilateral upper extremity supported, During functional activity, Reliant on assistive device for balance Standing balance-Leahy Scale: Good Standing balance comment: steady ambulating with RW use                            Cognition Arousal: Alert Behavior During Therapy: WFL for tasks assessed/performed Overall Cognitive Status: Within Functional Limits for  tasks assessed                                 General Comments: Motivated        Exercises Total Joint Exercises Quad Sets: AROM, Strengthening, Left, 10 reps, Supine Short Arc Quad: AROM, Strengthening,  Left, 10 reps, Supine Heel Slides: AROM, Strengthening, Left, 10 reps, Supine Hip ABduction/ADduction: AROM, Strengthening, Left, 10 reps, Supine Straight Leg Raises: AROM, Strengthening, Left, 10 reps, Supine Long Arc Quad: AROM, Strengthening, Left, 10 reps, Supine Knee Flexion: AROM, Strengthening, Left, 10 reps, Seated Goniometric ROM: L knee AROM 0-90 degrees    General Comments General comments (skin integrity, edema, etc.): L knee dressing in place      Pertinent Vitals/Pain Pain Assessment Pain Assessment: 0-10 Pain Score: 0-No pain Pain Location: L knee Pain Intervention(s): Limited activity within patient's tolerance, Monitored during session, Premedicated before session, Other (comment) (polar care applied)    Home Living     Prior Function            PT Goals (current goals can now be found in the care plan section) Acute Rehab PT Goals Patient Stated Goal: to improve pain and mobility PT Goal Formulation: With patient Time For Goal Achievement: 04/17/23 Potential to Achieve Goals: Good Progress towards PT goals: Progressing toward goals    Frequency    BID      PT Plan      Co-evaluation              AM-PAC PT "6 Clicks" Mobility   Outcome Measure  Help needed turning from your back to your side while in a flat bed without using bedrails?: None Help needed moving from lying on your back to sitting on the side of a flat bed without using bedrails?: A Little Help needed moving to and from a bed to a chair (including a wheelchair)?: A Little Help needed standing up from a chair using your arms (e.g., wheelchair or bedside chair)?: A Little Help needed to walk in hospital room?: A Little Help needed climbing 3-5 steps with a railing? : A Little 6 Click Score: 19    End of Session Equipment Utilized During Treatment: Gait belt Activity Tolerance: Patient tolerated treatment well Patient left: in chair;with call bell/phone within reach;with  SCD's reapplied;Other (comment) (polar care in place; L heel floating via towel roll) Nurse Communication: Precautions;Mobility status;Weight bearing status;Other (comment) (pt's pain status) PT Visit Diagnosis: Other abnormalities of gait and mobility (R26.89);Muscle weakness (generalized) (M62.81);History of falling (Z91.81);Pain Pain - Right/Left: Left Pain - part of body: Knee     Time: 1005-1046 PT Time Calculation (min) (ACUTE ONLY): 41 min  Charges:    $Gait Training: 8-22 mins $Therapeutic Exercise: 8-22 mins $Therapeutic Activity: 8-22 mins PT General Charges $$ ACUTE PT VISIT: 1 Visit                     Hendricks Limes, PT 04/04/23, 1:36 PM

## 2023-04-04 NOTE — Plan of Care (Signed)
  Problem: Education: Goal: Knowledge of the prescribed therapeutic regimen will improve Outcome: Progressing   Problem: Activity: Goal: Ability to avoid complications of mobility impairment will improve Outcome: Progressing Goal: Range of joint motion will improve Outcome: Progressing   Problem: Pain Management: Goal: Pain level will decrease with appropriate interventions Outcome: Progressing   Problem: Tissue Perfusion: Goal: Adequacy of tissue perfusion will improve Outcome: Progressing

## 2023-04-04 NOTE — Progress Notes (Signed)
Patient is not able to walk the distance required to go the bathroom, or he is unable to safely negotiate stairs required to access the bathroom.  A 3in1 BSC will alleviate this problem.        T. Chris Gaines, PA-C [facility] 

## 2023-04-04 NOTE — Evaluation (Signed)
Occupational Therapy Evaluation Patient Details Name: Gene Brown MRN: [id number] DOB: 1951/06/15 Today's Date: 04/04/2023   History of Present Illness Pt is a 72 y.o. male s/p L TKA secondary to OA 04/03/23.  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   Clinical Impression   Patient received for OT evaluation. See flowsheet below for details of function. Pt participated well in OT training on ADLs/IADLs s/p L TKA. Ready for d/c home once cleared by surgical team and PT. Patient with no further need for OT in acute care; discharge OT services.       If plan is discharge home, recommend the following: A little help with bathing/dressing/bathroom;Assistance with cooking/housework;Assist for transportation;Help with stairs or ramp for entrance    Functional Status Assessment  Patient has had a recent decline in their functional status and demonstrates the ability to make significant improvements in function in a reasonable and predictable amount of time.  Equipment Recommendations  None recommended by OT    Recommendations for Other Services       Precautions / Restrictions Precautions Precautions: Knee;Fall Restrictions Weight Bearing Restrictions: No LLE Weight Bearing: Weight bearing as tolerated      Mobility Bed Mobility               General bed mobility comments: Not tested, pt in recliner at beginning and end of session.    Transfers Overall transfer level: Modified independent Equipment used: Rolling walker (2 wheels) Transfers: Sit to/from Stand                    Balance Overall balance assessment: Needs assistance Sitting-balance support: No upper extremity supported, Feet supported Sitting balance-Leahy Scale: Good     Standing balance support: Bilateral upper extremity supported, During functional activity, Reliant on assistive device for balance Standing  balance-Leahy Scale: Good                             ADL either performed or assessed with clinical judgement   ADL Overall ADL's : Needs assistance/impaired                 Upper Body Dressing : Set up;Sitting   Lower Body Dressing: Set up;Sit to/from stand;Cueing for safety Lower Body Dressing Details (indicate cue type and reason): cues for doing threading from seated position for safety   Toilet Transfer Details (indicate cue type and reason): anticipate MOD (I), as pt is able to stand from recliner with MOD (I) and extra time         Functional mobility during ADLs: Supervision/safety;Rolling walker (2 wheels) General ADL Comments: Pt able to lean forward to thread shorts over surgical leg. OT teaching on threading surgical side first; good carryover. Using RW appropriately after cue for pushing it instead of lifting it.     Vision Patient Visual Report: No change from baseline       Perception         Praxis         Pertinent Vitals/Pain Pain Assessment Pain Assessment: No/denies pain     Extremity/Trunk Assessment Upper Extremity Assessment Upper Extremity Assessment: Overall WFL for tasks assessed   Lower Extremity Assessment Lower Extremity Assessment: Defer to PT evaluation   Cervical / Trunk Assessment Cervical / Trunk Assessment: Normal   Communication Communication Communication: No apparent difficulties   Cognition Arousal: Alert Behavior During  Therapy: WFL for tasks assessed/performed Overall Cognitive Status: Within Functional Limits for tasks assessed                                 General Comments: Pleasant, talkative, motivated     General Comments  Pt able to mobilize approx 40 feet with RW in hallway during session, SBA. No loss of balance. L knee dressing in place and TED hose on BIL LE. OT education on fall prevention strategies in the home during ADLs    Exercises     Shoulder Instructions       Home Living Family/patient expects to be discharged to:: Private residence Living Arrangements: Alone Available Help at Discharge: Neighbor;Available PRN/intermittently (will be present for the first 24 hour, then PRN after that.) Type of Home: Apartment (800 square feet) Home Access: Stairs to enter Entrance Stairs-Number of Steps: 3 Entrance Stairs-Rails: None Home Layout: One level     Bathroom Shower/Tub: Chief Strategy Officer: Standard (BSC over) Bathroom Accessibility: Yes How Accessible: Accessible via walker Home Equipment: Cane - single point;Grab bars - tub/shower;BSC/3in1;Adaptive equipment Adaptive Equipment: Reacher Additional Comments: Neighbor plans to assist with meals and housework. Pt states supportive church friends also volunteering to help with meals. Neighbor is caring for his dog while he recovers.      Prior Functioning/Environment Prior Level of Function : Independent/Modified Independent             Mobility Comments: Modified independent ambulating with SPC.  1 recent fall backwards in kitchen (2 falls total in last 90 days). ADLs Comments: (I) ADLs; driving. (I) IADLs.        OT Problem List: Decreased range of motion;Decreased activity tolerance;Impaired balance (sitting and/or standing)      OT Treatment/Interventions:      OT Goals(Current goals can be found in the care plan section) Acute Rehab OT Goals Patient Stated Goal: Go home OT Goal Formulation: All assessment and education complete, DC therapy  OT Frequency:      Co-evaluation              AM-PAC OT "6 Clicks" Daily Activity     Outcome Measure Help from another person eating meals?: None Help from another person taking care of personal grooming?: None Help from another person toileting, which includes using toliet, bedpan, or urinal?: None Help from another person bathing (including washing, rinsing, drying)?: A Little Help from another person to put  on and taking off regular upper body clothing?: None Help from another person to put on and taking off regular lower body clothing?: A Little 6 Click Score: 22   End of Session Equipment Utilized During Treatment: Rolling walker (2 wheels) Nurse Communication: Mobility status  Activity Tolerance: Patient tolerated treatment well Patient left: in chair;with call bell/phone within reach  OT Visit Diagnosis: Unsteadiness on feet (R26.81)                Time: 1610-9604 OT Time Calculation (min): 23 min Charges:  OT General Charges $OT Visit: 1 Visit OT Evaluation $OT Eval Moderate Complexity: 1 Mod OT Treatments $Self Care/Home Management : 8-22 mins  Linward Foster, MS, OTR/L  Alvester Morin 04/04/2023, 10:03 AM

## 2023-04-04 NOTE — Discharge Instructions (Signed)

## 2023-04-04 NOTE — Anesthesia Postprocedure Evaluation (Signed)
Anesthesia Post Note  Patient: Gene Brown  Procedure(s) Performed: TOTAL KNEE ARTHROPLASTY (Left: Knee)  Patient location during evaluation: Nursing Unit Anesthesia Type: Spinal Level of consciousness: oriented and awake and alert Pain management: pain level controlled Vital Signs Assessment: post-procedure vital signs reviewed and stable Respiratory status: spontaneous breathing and respiratory function stable Cardiovascular status: blood pressure returned to baseline and stable Postop Assessment: no headache, no backache, no apparent nausea or vomiting and patient able to bend at knees Anesthetic complications: no   No notable events documented.   Last Vitals:  Vitals:   04/04/23 0510 04/04/23 0720  BP: (!) 143/67 (!) 161/84  Pulse: 66 68  Resp: 16 18  Temp: 36.8 C 36.4 C  SpO2: 97% 96%    Last Pain:  Vitals:   04/04/23 0745  TempSrc:   PainSc: 0-No pain                 Mathews Argyle P

## 2023-04-04 NOTE — TOC Progression Note (Signed)
Transition of Care [facility]) - Progression Note    Patient Details  Name: Gene Brown MRN: [id number] Date of Birth: [birth year]  Transition of [facility]) CM/SW Contact  Marlowe Sax, RN Phone Number: 04/04/2023, 10:03 AM  Clinical Narrative:     Patient is set up with [facility] for [facility] by surgeons office prior to surgery by surgeons office RW to be delivered by adapt to the bedside       Expected Discharge Plan and Services         Expected Discharge Date: 04/04/23                                     Social Determinants of Health (SDOH) Interventions SDOH Screenings   Food Insecurity: No Food Insecurity (04/03/2023)  Housing: Low Risk  (04/03/2023)  Transportation Needs: No Transportation Needs (04/03/2023)  Utilities: Not At Risk (04/03/2023)  Financial Resource Strain: Low Risk  (01/17/2023)   Received from [facility]  Tobacco Use: Low Risk  (04/03/2023)    Readmission Risk Interventions     No data to display

## 2023-04-04 NOTE — Progress Notes (Addendum)
   Subjective: 1 Day Post-Op Procedure(s) (LRB): TOTAL KNEE ARTHROPLASTY (Left) Patient reports pain as 0 on 0-10 scale.   Patient is well, and has had no acute complaints or problems Denies any CP, SOB, ABD pain. We will continue therapy today.  Plan is to go Home after hospital stay.  Objective: Vital signs in last 24 hours: Temp:  [97.1 F (36.2 C)-98.6 F (37 C)] 97.6 F (36.4 C) (08/27 0720) Pulse Rate:  [65-83] 68 (08/27 0720) Resp:  [11-20] 18 (08/27 0720) BP: (125-161)/(62-84) 161/84 (08/27 0720) SpO2:  [96 %-100 %] 96 % (08/27 0720) Weight:  [110.9 kg] 110.9 kg (08/26 0926)  Intake/Output from previous day: 08/26 0701 - 08/27 0700 In: 1776.6 [I.V.:1376.6; IV Piggyback:400] Out: 3650 [Urine:3600; Blood:50] Intake/Output this shift: No intake/output data recorded.  Recent Labs    04/04/23 0615  HGB 11.3*   Recent Labs    04/04/23 0615  WBC 10.5  RBC 3.88*  HCT 32.3*  PLT 263   Recent Labs    04/04/23 0615  NA 137  K 3.8  CL 109  CO2 20*  BUN 33*  CREATININE 1.32*  GLUCOSE 194*  CALCIUM 8.8*   No results for input(s): "LABPT", "INR" in the last 72 hours.  EXAM General - Patient is Alert, Appropriate, and Oriented Extremity - Neurovascular intact Sensation intact distally Intact pulses distally Dorsiflexion/Plantar flexion intact Dressing - no drainage, scant drainage, and moderate drainage Motor Function - intact, moving foot and toes well on exam.   Past Medical History:  Diagnosis Date   Arthritis 06/25/2015   BPH (benign prostatic hyperplasia)    Complication of anesthesia    has anxiety about "needles going in the back" doesnt want a spinal   Fatty liver    H/O varicella 06/25/2015   H/O: gout 06/25/2015   HTN (hypertension)    Hyperlipidemia    Lymphedema    Sickle cell trait (HCC)    Skin cancer of nose    Type 2 diabetes mellitus (HCC) 06/25/2015    Assessment/Plan:   1 Day Post-Op Procedure(s) (LRB): TOTAL KNEE  ARTHROPLASTY (Left) Principal Problem:   S/P TKR (total knee replacement) using cement, left  Estimated body mass index is 37.17 kg/m as calculated from the following:   Height as of this encounter: 5\' 8"  (1.727 m).   Weight as of this encounter: 110.9 kg. Advance diet Up with therapy Pain well controlled Labs and VSS, Cr 1.32, close to baseline, patient lives between 1.0-1.3 CM to assist with discharge to home with HHPT today pending safe completion of PT.  DVT Prophylaxis - Lovenox, TED hose, and SCDs Weight-Bearing as tolerated to left leg   T. Cranston Neighbor, PA-C [facility] 04/04/2023, 7:54 AM  Patient seen and examined, agree with above plan.  The patient is doing well status post left total knee arthroplasty, no concerns at this time.  Pain is controlled.  Discussed DVT prophylaxis, pain medication use, and safe transition to home.  All questions answered the patient agrees with above plan will go home after clears PT.   Reinaldo Berber MD

## 2023-04-04 NOTE — Discharge Summary (Signed)
Physician Discharge Summary  Patient ID: Gene Brown MRN: [id number] DOB/AGE: August 05, 1951 72 y.o.  Admit date: 04/03/2023 Discharge date: 04/04/2023  Admission Diagnoses:  S/P TKR (total knee replacement) using cement, left [Z96.652]   Discharge Diagnoses: Patient Active Problem List   Diagnosis Date Noted   S/P TKR (total knee replacement) using cement, left 04/03/2023   Right groin pain 05/13/2021   Elevated PSA 01/24/2017   Nocturia 01/24/2017   Erectile dysfunction due to arterial insufficiency 01/24/2017   Arthritis 06/25/2015   H/O varicella 06/25/2015   H/O: gout 06/25/2015   H/O nutritional disorder 06/25/2015   HLD (hyperlipidemia) 06/25/2015   BP (high blood pressure) 06/25/2015   Sickle cell trait (HCC) 06/25/2015   Type 2 diabetes mellitus (HCC) 06/25/2015   Low serum vitamin D 06/17/2015    Past Medical History:  Diagnosis Date   Arthritis 06/25/2015   BPH (benign prostatic hyperplasia)    Complication of anesthesia    has anxiety about "needles going in the back" doesnt want a spinal   Fatty liver    H/O varicella 06/25/2015   H/O: gout 06/25/2015   HTN (hypertension)    Hyperlipidemia    Lymphedema    Sickle cell trait (HCC)    Skin cancer of nose    Type 2 diabetes mellitus (HCC) 06/25/2015     Transfusion: none   Consultants (if any):   Discharged Condition: Improved  Hospital Course: DAUD BLASINGAME is an 72 y.o. male who was admitted 04/03/2023 with a diagnosis of S/P TKR (total knee replacement) using cement, left and went to the operating room on 04/03/2023 and underwent the above named procedures.    Surgeries: Procedure(s): TOTAL KNEE ARTHROPLASTY on 04/03/2023 Patient tolerated the surgery well. Taken to PACU where she was stabilized and then transferred to the orthopedic floor.  Started on Lovenox 30 mg q 12 hrs. TEDs and SCDs applied bilaterally. Heels elevated on bed. No evidence of DVT. Negative Homan. Physical therapy started on  day #1 for gait training and transfer. OT started day #1 for ADL and assisted devices.  Patient's IV was d/c on day #1. Patient was able to safely and independently complete all PT goals. PT recommending discharge to home.    On post op day #1 patient was stable and ready for discharge to home with HHPT.  Implants: Femur: Persona Size 10 CR   Tibia: Persona Size G w/ 14x6mm stem extension  Poly: 10mm MC  Patella: 35x32mm symmetric   He was given perioperative antibiotics:  Anti-infectives (From admission, onward)    Start     Dose/Rate Route Frequency Ordered Stop   04/03/23 1600  ceFAZolin (ANCEF) IVPB 2g/100 mL premix        2 g 200 mL/hr over 30 Minutes Intravenous Every 6 hours 04/03/23 1352 04/03/23 2227   04/03/23 0600  ceFAZolin (ANCEF) IVPB 2g/100 mL premix        2 g 200 mL/hr over 30 Minutes Intravenous On call to O.R. 04/02/23 2242 04/03/23 1050     .  He was given sequential compression devices, early ambulation, and Lovnenox TEDs for DVT prophylaxis.  He benefited maximally from the hospital stay and there were no complications.    Recent vital signs:  Vitals:   04/04/23 0510 04/04/23 0720  BP: (!) 143/67 (!) 161/84  Pulse: 66 68  Resp: 16 18  Temp: 98.2 F (36.8 C) 97.6 F (36.4 C)  SpO2: 97% 96%    Recent laboratory studies:  Lab Results  Component Value Date   HGB 11.3 (L) 04/04/2023   HGB 11.4 (L) 03/22/2023   Lab Results  Component Value Date   WBC 10.5 04/04/2023   PLT 263 04/04/2023   No results found for: "INR" Lab Results  Component Value Date   NA 137 04/04/2023   K 3.8 04/04/2023   CL 109 04/04/2023   CO2 20 (L) 04/04/2023   BUN 33 (H) 04/04/2023   CREATININE 1.32 (H) 04/04/2023   GLUCOSE 194 (H) 04/04/2023    Discharge Medications:   Allergies as of 04/04/2023       Reactions   Sitagliptin Other (See Comments)   Extreme family history of cancer   Statins Other (See Comments)   Muscle weakness        Medication List      TAKE these medications    acetaminophen 500 MG tablet Commonly known as: TYLENOL Take 1,000 mg by mouth every 6 (six) hours as needed.   amLODipine-benazepril 10-40 MG capsule Commonly known as: LOTREL Take 1 capsule by mouth daily.   bismuth subsalicylate 262 MG/15ML suspension Commonly known as: PEPTO BISMOL Take 30 mLs by mouth every 4 (four) hours as needed.   Cholecalciferol 25 MCG (1000 UT) tablet Take by mouth. IN MORNING   co-enzyme Q-10 30 MG capsule Take 30 mg by mouth 3 (three) times daily.   Cyanocobalamin 1000 MCG Tbcr Take by mouth. IN THE MORNING   docusate sodium 100 MG capsule Commonly known as: COLACE Take 1 capsule (100 mg total) by mouth 2 (two) times daily.   enoxaparin 40 MG/0.4ML injection Commonly known as: LOVENOX Inject 0.4 mLs (40 mg total) into the skin daily for 14 days.   ezetimibe 10 MG tablet Commonly known as: ZETIA Take 10 mg by mouth daily.   fenofibrate 48 MG tablet Commonly known as: TRICOR Take 48 mg by mouth daily.   ferrous sulfate 324 MG Tbec Take 324 mg by mouth.   finasteride 5 MG tablet Commonly known as: PROSCAR Take 1 tablet (5 mg total) by mouth daily. What changed: additional instructions   gabapentin 400 MG capsule Commonly known as: NEURONTIN Take 400 mg by mouth 3 (three) times daily.   glipiZIDE 5 MG tablet Commonly known as: GLUCOTROL Take 10 mg by mouth 2 (two) times daily before a meal.   Lantus 100 UNIT/ML injection Generic drug: insulin glargine 40 Units at bedtime. If BS  > 120 will hold   Magnesium 250 MG Tabs Take 1 tablet by mouth daily.   metFORMIN 1000 MG tablet Commonly known as: GLUCOPHAGE 500 mg 2 (two) times daily with a meal.   ondansetron 4 MG tablet Commonly known as: ZOFRAN Take 1 tablet (4 mg total) by mouth every 6 (six) hours as needed for nausea.   oxyCODONE 5 MG immediate release tablet Commonly known as: Roxicodone Take 0.5-1 tablets (2.5-5 mg total) by mouth every  6 (six) hours as needed for breakthrough pain.   pregabalin 150 MG capsule Commonly known as: LYRICA Take 150 mg by mouth 2 (two) times daily.   tamsulosin 0.4 MG Caps capsule Commonly known as: FLOMAX Take 1 capsule (0.4 mg total) by mouth daily.   traMADol 50 MG tablet Commonly known as: ULTRAM Take 1 tablet (50 mg total) by mouth every 6 (six) hours as needed for moderate pain.   Trulicity 1.5 MG/0.5ML Sopn Generic drug: Dulaglutide Inject into the skin once a week.   zinc gluconate 50 MG tablet Take  50 mg by mouth daily.               Durable Medical Equipment  (From admission, onward)           Start     Ordered   04/04/23 0802  For home use only DME Walker rolling  Once       Question Answer Comment  Walker: With 5 Inch Wheels   Patient needs a walker to treat with the following condition Total knee replacement status      04/04/23 0802            Diagnostic Studies: DG Knee Left Port  Result Date: 04/03/2023 CLINICAL DATA:  Status post knee replacement EXAM: PORTABLE LEFT KNEE - 1-2 VIEW COMPARISON:  Left knee MRI 11/02/2010 FINDINGS: The patient is status post left knee arthroplasty as well as prior ACL replacement. The arthroplasty hardware appears intact, without evidence of complication. There is no perihardware fracture. Knee alignment is normal. There is expected surrounding soft tissue swelling and soft tissue gas. IMPRESSION: Status post left knee arthroplasty without evidence of complication. Electronically Signed   By: Lesia Hausen M.D.   On: 04/03/2023 14:09    Disposition:      Follow-up Information     Evon Slack, PA-C Follow up in 2 week(s).   Specialties: Orthopedic Surgery, Emergency Medicine Contact information: [address] [place] [address] [phone]                  Signed: Patience Musca 04/04/2023, 8:06 AM

## 2023-04-26 ENCOUNTER — Emergency Department
Admission: EM | Admit: 2023-04-26 | Discharge: 2023-04-26 | Discharge status: Against Medical Advice | Payer: Medicare Other | Attending: Emergency Medicine | Admitting: Emergency Medicine

## 2023-04-26 ENCOUNTER — Emergency Department: Payer: Medicare Other

## 2023-04-26 DIAGNOSIS — R0602 Shortness of breath: Secondary | ICD-10-CM | POA: Diagnosis not present

## 2023-04-26 DIAGNOSIS — R0789 Other chest pain: Secondary | ICD-10-CM | POA: Insufficient documentation

## 2023-04-26 DIAGNOSIS — E119 Type 2 diabetes mellitus without complications: Secondary | ICD-10-CM | POA: Diagnosis not present

## 2023-04-26 DIAGNOSIS — I10 Essential (primary) hypertension: Secondary | ICD-10-CM | POA: Diagnosis not present

## 2023-04-26 LAB — TROPONIN I (HIGH SENSITIVITY)
Troponin I (High Sensitivity): 8 ng/L (ref <18)
Troponin I (High Sensitivity): 8 ng/L (ref <18)

## 2023-04-26 LAB — BASIC METABOLIC PANEL WITH GFR
Anion gap: 11 (ref 5–15)
BUN: 27 mg/dL — ABNORMAL HIGH (ref 8–23)
CO2: 23 mmol/L (ref 22–32)
Calcium: 9.5 mg/dL (ref 8.9–10.3)
Chloride: 101 mmol/L (ref 98–111)
Creatinine, Ser: 1.44 mg/dL — ABNORMAL HIGH (ref 0.61–1.24)
GFR, Estimated: 51 mL/min — ABNORMAL LOW (ref >60)
Glucose, Bld: 127 mg/dL — ABNORMAL HIGH (ref 70–99)
Potassium: 3.5 mmol/L (ref 3.5–5.1)
Sodium: 135 mmol/L (ref 135–145)

## 2023-04-26 LAB — CBC
HCT: 33.3 % — ABNORMAL LOW (ref 39.0–52.0)
Hemoglobin: 11.2 g/dL — ABNORMAL LOW (ref 13.0–17.0)
MCH: 28.4 pg (ref 26.0–34.0)
MCHC: 33.6 g/dL (ref 30.0–36.0)
MCV: 84.5 fL (ref 80.0–100.0)
Platelets: 464 10*3/uL — ABNORMAL HIGH (ref 150–400)
RBC: 3.94 MIL/uL — ABNORMAL LOW (ref 4.22–5.81)
RDW: 13.2 % (ref 11.5–15.5)
WBC: 8 10*3/uL (ref 4.0–10.5)
nRBC: 0 % (ref 0.0–0.2)

## 2023-04-26 NOTE — ED Triage Notes (Signed)
Pt reports exertional CP and SOB that started yesterday after removing a SCD. Denies CP currently.

## 2023-04-26 NOTE — ED Provider Notes (Signed)
[facility] Provider Note    Event Date/Time   First MD Initiated Contact with Patient 04/26/23 1806     (approximate)   History   Chief Complaint Chest Pain and Shortness of Breath   HPI  Gene Brown is a 72 y.o. male with past medical history of hypertension, hyperlipidemia, and diabetes who presents to the ED complaining of chest pain.  Patient reports that last night while moving furniture in his home he began to have sharp pain in the center of his chest.  Pain lasted for about 10 minutes before resolving with rest, but he had another episode of similar symptoms today while vacuuming his floor.  The pain again resolved after about 10 minutes of rest and he denies any associated difficulty breathing.  He now feels back to normal, has not had any fevers or cough and denies any pain or swelling in his legs.  He does state that he had knee replacement surgery less than 1 month ago, has not had any issues in that leg since then.     Physical Exam   Triage Vital Signs: ED Triage Vitals [04/26/23 1409]  Encounter Vitals Group     BP (!) 141/80     Systolic BP Percentile      Diastolic BP Percentile      Pulse Rate 88     Resp 20     Temp 97.8 F (36.6 C)     Temp Source Oral     SpO2 100 %     Weight      Height      Head Circumference      Peak Flow      Pain Score      Pain Loc      Pain Education      Exclude from Growth Chart     Most recent vital signs: Vitals:   04/26/23 1409  BP: (!) 141/80  Pulse: 88  Resp: 20  Temp: 97.8 F (36.6 C)  SpO2: 100%    Constitutional: Alert and oriented. Eyes: Conjunctivae are normal. Head: Atraumatic. Nose: No congestion/rhinnorhea. Mouth/Throat: Mucous membranes are moist.  Cardiovascular: Normal rate, regular rhythm. Grossly normal heart sounds.  2+ radial pulses bilaterally. Respiratory: Normal respiratory effort.  No retractions. Lungs CTAB. Gastrointestinal: Soft and nontender. No  distention. Musculoskeletal: No lower extremity tenderness nor edema.  Neurologic:  Normal speech and language. No gross focal neurologic deficits are appreciated.    ED Results / Procedures / Treatments   Labs (all labs ordered are listed, but only abnormal results are displayed) Labs Reviewed  BASIC METABOLIC PANEL - Abnormal; Notable for the following components:      Result Value   Glucose, Bld 127 (*)    BUN 27 (*)    Creatinine, Ser 1.44 (*)    GFR, Estimated 51 (*)    All other components within normal limits  CBC - Abnormal; Notable for the following components:   RBC 3.94 (*)    Hemoglobin 11.2 (*)    HCT 33.3 (*)    Platelets 464 (*)    All other components within normal limits  TROPONIN I (HIGH SENSITIVITY)  TROPONIN I (HIGH SENSITIVITY)     EKG  ED ECG REPORT I, Chesley Noon, the attending physician, personally viewed and interpreted this ECG.   Date: 04/26/2023  EKG Time: 14:22  Rate: 73  Rhythm: normal sinus rhythm  Axis: Normal  Intervals:none  ST&T Change: None  RADIOLOGY  Chest x-ray reviewed and interpreted by me with no infiltrate, edema, or effusion.  PROCEDURES:  Critical Care performed: No  Procedures   MEDICATIONS ORDERED IN ED: Medications - No data to display   IMPRESSION / MDM / ASSESSMENT AND PLAN / ED COURSE  I reviewed the triage vital signs and the nursing notes.                              72 y.o. male with past medical history of hypertension, hyperlipidemia, diabetes who presents to the ED with chest pain with exertion lasting about 10 minutes for the past 2 days.  Patient's presentation is most consistent with acute presentation with potential threat to life or bodily function.  Differential diagnosis includes, but is not limited to, ACS, PE, pneumonia, pneumothorax, musculoskeletal pain, GERD, and anxiety.  Patient well-appearing and in no acute distress, vital signs are unremarkable.  EKG shows no evidence of  arrhythmia or ischemia and 2 sets of troponin are within normal limits, low suspicion for ACS given his atypical symptoms.  Chest x-ray is unremarkable and I doubt PE given resolution of his symptoms with reassuring vital signs.  Remainder of labs show no significant anemia, leukocytosis, tract abnormality, or AKI.  Given patient is symptom-free, he is appropriate for outpatient follow-up with his cardiologist, was counseled to return to the ED for new or worsening symptoms.  Patient agrees with plan.    FINAL CLINICAL IMPRESSION(S) / ED DIAGNOSES   Final diagnoses:  Atypical chest pain     Rx / DC Orders   ED Discharge Orders          Ordered    Ambulatory referral to Cardiology        04/26/23 1929             Note:  This document was prepared using Dragon voice recognition software and may include unintentional dictation errors.   Chesley Noon, MD 04/26/23 [phone]

## 2023-06-20 ENCOUNTER — Ambulatory Visit: Payer: Medicare Other | Admitting: Student in an Organized Health Care Education/Training Program

## 2023-07-18 ENCOUNTER — Ambulatory Visit: Payer: Medicare Other | Admitting: Student in an Organized Health Care Education/Training Program
